# Patient Record
Sex: Female | Born: 1945
Health system: Southern US, Community
[De-identification: ages and names within clinical notes are randomized; demographics above are authoritative.]

## PROBLEM LIST (undated history)

## (undated) DIAGNOSIS — G43909 Migraine, unspecified, not intractable, without status migrainosus: Secondary | ICD-10-CM

## (undated) DIAGNOSIS — Z809 Family history of malignant neoplasm, unspecified: Secondary | ICD-10-CM

## (undated) DIAGNOSIS — K56609 Unspecified intestinal obstruction, unspecified as to partial versus complete obstruction: Secondary | ICD-10-CM

## (undated) DIAGNOSIS — K219 Gastro-esophageal reflux disease without esophagitis: Secondary | ICD-10-CM

## (undated) DIAGNOSIS — C801 Malignant (primary) neoplasm, unspecified: Secondary | ICD-10-CM

## (undated) HISTORY — DX: Migraine, unspecified, not intractable, without status migrainosus: G43.909

## (undated) HISTORY — DX: Family history of malignant neoplasm, unspecified: Z80.9

## (undated) HISTORY — DX: Unspecified intestinal obstruction, unspecified as to partial versus complete obstruction: K56.609

## (undated) HISTORY — DX: Gastro-esophageal reflux disease without esophagitis: K21.9

## (undated) SURGERY — Surgical Case
Anesthesia: *Unknown

---
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
## 1998-02-07 ENCOUNTER — Other Ambulatory Visit: Admission: RE | Admit: 1998-02-07 | Discharge: 1998-02-07 | Payer: Self-pay | Admitting: *Deleted

## 1998-09-20 ENCOUNTER — Ambulatory Visit (HOSPITAL_COMMUNITY): Admission: RE | Admit: 1998-09-20 | Discharge: 1998-09-20 | Payer: Self-pay | Admitting: *Deleted

## 1998-09-20 ENCOUNTER — Encounter: Payer: Self-pay | Admitting: *Deleted

## 1998-10-07 ENCOUNTER — Ambulatory Visit (HOSPITAL_COMMUNITY): Admission: RE | Admit: 1998-10-07 | Discharge: 1998-10-07 | Payer: Self-pay | Admitting: *Deleted

## 1998-10-07 ENCOUNTER — Encounter: Payer: Self-pay | Admitting: *Deleted

## 1999-05-15 ENCOUNTER — Other Ambulatory Visit: Admission: RE | Admit: 1999-05-15 | Discharge: 1999-05-15 | Payer: Self-pay | Admitting: *Deleted

## 2002-03-18 ENCOUNTER — Encounter: Payer: Self-pay | Admitting: *Deleted

## 2002-03-18 ENCOUNTER — Emergency Department (HOSPITAL_COMMUNITY): Admission: EM | Admit: 2002-03-18 | Discharge: 2002-03-18 | Payer: Self-pay | Admitting: Emergency Medicine

## 2002-04-25 ENCOUNTER — Ambulatory Visit (HOSPITAL_COMMUNITY): Admission: RE | Admit: 2002-04-25 | Discharge: 2002-04-25 | Payer: Self-pay | Admitting: Neurology

## 2002-04-25 ENCOUNTER — Encounter: Payer: Self-pay | Admitting: Neurology

## 2002-06-22 ENCOUNTER — Encounter: Payer: Self-pay | Admitting: Obstetrics and Gynecology

## 2002-06-22 ENCOUNTER — Ambulatory Visit (HOSPITAL_COMMUNITY): Admission: RE | Admit: 2002-06-22 | Discharge: 2002-06-22 | Payer: Self-pay | Admitting: Obstetrics and Gynecology

## 2003-03-12 LAB — TSH: TSH: 1.71

## 2003-03-12 LAB — LIPID PANEL

## 2003-03-12 LAB — COMPREHENSIVE METABOLIC PANEL
ALT: 17 U/L (ref 7–35)
Alkaline Phosphatase: 91 U/L
Total Bilirubin: 0.9 mg/dL

## 2008-09-07 ENCOUNTER — Emergency Department (HOSPITAL_COMMUNITY): Admission: EM | Admit: 2008-09-07 | Discharge: 2008-09-07 | Payer: Self-pay | Admitting: Emergency Medicine

## 2010-12-12 ENCOUNTER — Encounter: Payer: Self-pay | Admitting: Family Medicine

## 2010-12-12 ENCOUNTER — Ambulatory Visit (INDEPENDENT_AMBULATORY_CARE_PROVIDER_SITE_OTHER): Payer: Medicare HMO | Admitting: Family Medicine

## 2010-12-12 VITALS — BP 118/72 | HR 72 | Temp 97.7°F | Ht 67.0 in | Wt 177.2 lb

## 2010-12-12 DIAGNOSIS — Z0000 Encounter for general adult medical examination without abnormal findings: Secondary | ICD-10-CM

## 2010-12-12 DIAGNOSIS — K219 Gastro-esophageal reflux disease without esophagitis: Secondary | ICD-10-CM

## 2010-12-12 DIAGNOSIS — Z1231 Encounter for screening mammogram for malignant neoplasm of breast: Secondary | ICD-10-CM

## 2010-12-12 DIAGNOSIS — Z809 Family history of malignant neoplasm, unspecified: Secondary | ICD-10-CM

## 2010-12-12 NOTE — Patient Instructions (Signed)
pass by [facility] office for setting up mammogram. Return for welcome to medicare visit. Return prior fasting for blood work. Stool kit provided today. We will do breast exam and pelvic next visit at your welcome to medicare visit. I'll request reocrds from Dr. Malon Kindle office.

## 2010-12-12 NOTE — Assessment & Plan Note (Addendum)
Declines colonoscopy. Will set up with mammo and iFOB today. Return for welcome to medicare visit and well woman exam as due. Return fasting for blood work as well.

## 2010-12-12 NOTE — Progress Notes (Signed)
Subjective:    Patient ID: Katherine Boone, female    DOB: August 29, 1945, 65 y.o.   MRN: [id number]  HPI CC: new pt establish  Previously saw Dr. Modena Jansky at [place].  Used to see OBGYN, no doctor in 8 years.  New to medicare this year.  Did fall down flight of steps, 2 years ago broke right arm.  Healed well.  GERD - knows food to avoid but doesn't always avoid.  Takes prilosec otc as needed. H/o migraines, none recently.  Had CT done 9 yrs ago by neurology, told white matter changes of aging  Strong family hx stomach and colon cancer and lung cancer (smokers).  Pt denies BM changes, blood in stool, cough, weight changes, fever, is nonsmoker.   Preventative: Due for mammogram, last 8 yrs ago.  Right breast was sore last few weeks, now better with rest. Due for pelvic exam, last 8 yrs ago and normal. Never had colonoscopy done.  Declines.  Thinks would do stool kit. Thinks tetanus with Dr. Dorothe Pea, will request records. Flu shot - declines.  Medications and allergies reviewed and updated in chart.  Past histories reviewed and updated if relevant as below. There is no problem list on file for this patient.  Past Medical History  Diagnosis Date  . GERD (gastroesophageal reflux disease)     diet controlled  . Migraines     none recently   Past Surgical History  Procedure Date  . Appendectomy 1960   History  Substance Use Topics  . Smoking status: Never Smoker   . Smokeless tobacco: Never Used  . Alcohol Use: No   Family History  Problem Relation Age of Onset  . Heart disease Mother   . Hypertension Mother   . Cancer Mother 53    colon and stomach  . Cancer Brother     stomach and throat, smoker  . Ovarian cancer      maternal neice  . Hypertension Sister   . Cancer Brother     stomach and lung, smoker  . COPD Brother   . Throat cancer Brother   . Cancer Father     stomach cancer  . Stroke Sister   . Diabetes Neg Hx   . Coronary artery disease Neg Hx    No Known  Allergies No current outpatient prescriptions on file prior to visit.   Review of Systems  Constitutional: Positive for unexpected weight change (gaining weight). Negative for fever, chills, activity change, appetite change and fatigue.  HENT: Negative for hearing loss and neck pain.   Eyes: Negative for visual disturbance.  Respiratory: Negative for cough, chest tightness, shortness of breath and wheezing.   Cardiovascular: Negative for chest pain, palpitations and leg swelling.  Gastrointestinal: Negative for nausea, vomiting, abdominal pain, diarrhea, constipation, blood in stool and abdominal distention.  Genitourinary: Negative for hematuria and difficulty urinating.  Musculoskeletal: Negative for myalgias and arthralgias.  Skin: Negative for rash.  Neurological: Negative for dizziness, seizures, syncope and headaches.  Hematological: Does not bruise/bleed easily.  Psychiatric/Behavioral: Positive for dysphoric mood (with weather change). The patient is not nervous/anxious.        Objective:   Physical Exam  Nursing note and vitals reviewed. Constitutional: She is oriented to person, place, and time. She appears well-developed and well-nourished. No distress.  HENT:  Head: Normocephalic and atraumatic.  Right Ear: External ear normal.  Left Ear: External ear normal.  Nose: Nose normal.  Mouth/Throat: Oropharynx is clear and moist.  Eyes: Conjunctivae  and EOM are normal. Pupils are equal, round, and reactive to light.  Neck: Normal range of motion. Neck supple. No thyromegaly present.  Cardiovascular: Normal rate, regular rhythm, normal heart sounds and intact distal pulses.   No murmur heard. Pulses:      Radial pulses are 2+ on the right side, and 2+ on the left side.  Pulmonary/Chest: Effort normal and breath sounds normal. No respiratory distress. She has no wheezes. She has no rales.  Abdominal: Soft. Bowel sounds are normal. She exhibits no distension and no mass. There  is no tenderness. There is no rebound and no guarding.  Musculoskeletal: Normal range of motion.  Lymphadenopathy:    She has no cervical adenopathy.  Neurological: She is alert and oriented to person, place, and time.       CN grossly intact, station and gait intact  Skin: Skin is warm and dry. No rash noted.  Psychiatric: She has a normal mood and affect. Her behavior is normal. Judgment and thought content normal.      Assessment & Plan:

## 2010-12-12 NOTE — Assessment & Plan Note (Signed)
Reviewed foods to avoid.   Seems stable on prilosec otc PRN.

## 2010-12-26 ENCOUNTER — Other Ambulatory Visit (INDEPENDENT_AMBULATORY_CARE_PROVIDER_SITE_OTHER): Payer: Medicare HMO

## 2010-12-26 DIAGNOSIS — Z0000 Encounter for general adult medical examination without abnormal findings: Secondary | ICD-10-CM

## 2010-12-26 DIAGNOSIS — Z79899 Other long term (current) drug therapy: Secondary | ICD-10-CM

## 2010-12-26 LAB — CBC WITH DIFFERENTIAL/PLATELET
Basophils Relative: 0.5 % (ref 0.0–3.0)
Eosinophils Relative: 2.4 % (ref 0.0–5.0)
HCT: 38.8 % (ref 36.0–46.0)
Hemoglobin: 13.3 g/dL (ref 12.0–15.0)
Lymphs Abs: 1.5 10*3/uL (ref 0.7–4.0)
MCV: 87.2 fl (ref 78.0–100.0)
Monocytes Absolute: 0.4 10*3/uL (ref 0.1–1.0)
Neutro Abs: 3 10*3/uL (ref 1.4–7.7)
RBC: 4.45 Mil/uL (ref 3.87–5.11)
WBC: 5.1 10*3/uL (ref 4.5–10.5)

## 2010-12-26 LAB — COMPREHENSIVE METABOLIC PANEL
AST: 22 U/L (ref 0–37)
Albumin: 3.9 g/dL (ref 3.5–5.2)
Alkaline Phosphatase: 114 U/L (ref 39–117)
BUN: 9 mg/dL (ref 6–23)
Creatinine, Ser: 0.7 mg/dL (ref 0.4–1.2)
Potassium: 4.2 mEq/L (ref 3.5–5.1)

## 2010-12-26 LAB — LIPID PANEL
HDL: 59.1 mg/dL (ref >39.00)
VLDL: 19.8 mg/dL (ref 0.0–40.0)

## 2010-12-28 ENCOUNTER — Other Ambulatory Visit: Payer: Medicare HMO

## 2010-12-28 ENCOUNTER — Encounter: Payer: Self-pay | Admitting: *Deleted

## 2010-12-28 ENCOUNTER — Other Ambulatory Visit: Payer: Self-pay | Admitting: Family Medicine

## 2010-12-28 ENCOUNTER — Ambulatory Visit
Admission: RE | Admit: 2010-12-28 | Discharge: 2010-12-28 | Disposition: A | Discharge status: Home / Self Care | Payer: Medicare HMO | Source: Ambulatory Visit | Attending: Family Medicine | Admitting: Family Medicine

## 2010-12-28 DIAGNOSIS — Z1231 Encounter for screening mammogram for malignant neoplasm of breast: Secondary | ICD-10-CM

## 2010-12-28 DIAGNOSIS — Z1211 Encounter for screening for malignant neoplasm of colon: Secondary | ICD-10-CM

## 2011-01-01 ENCOUNTER — Encounter: Payer: Self-pay | Admitting: *Deleted

## 2011-01-02 ENCOUNTER — Other Ambulatory Visit (HOSPITAL_COMMUNITY)
Admission: RE | Admit: 2011-01-02 | Discharge: 2011-01-02 | Disposition: A | Discharge status: Home / Self Care | Payer: Medicare HMO | Source: Ambulatory Visit | Attending: Family Medicine | Admitting: Family Medicine

## 2011-01-02 ENCOUNTER — Encounter: Payer: Self-pay | Admitting: Family Medicine

## 2011-01-02 ENCOUNTER — Ambulatory Visit (INDEPENDENT_AMBULATORY_CARE_PROVIDER_SITE_OTHER): Payer: Medicare HMO | Admitting: Family Medicine

## 2011-01-02 VITALS — BP 110/72 | HR 72 | Temp 97.7°F | Ht 67.0 in | Wt 179.8 lb

## 2011-01-02 DIAGNOSIS — R3915 Urgency of urination: Secondary | ICD-10-CM

## 2011-01-02 DIAGNOSIS — Z136 Encounter for screening for cardiovascular disorders: Secondary | ICD-10-CM

## 2011-01-02 DIAGNOSIS — Z780 Asymptomatic menopausal state: Secondary | ICD-10-CM

## 2011-01-02 DIAGNOSIS — Z0000 Encounter for general adult medical examination without abnormal findings: Secondary | ICD-10-CM

## 2011-01-02 DIAGNOSIS — Z01419 Encounter for gynecological examination (general) (routine) without abnormal findings: Secondary | ICD-10-CM | POA: Insufficient documentation

## 2011-01-02 DIAGNOSIS — Z1159 Encounter for screening for other viral diseases: Secondary | ICD-10-CM | POA: Insufficient documentation

## 2011-01-02 DIAGNOSIS — G4700 Insomnia, unspecified: Secondary | ICD-10-CM | POA: Insufficient documentation

## 2011-01-02 DIAGNOSIS — R350 Frequency of micturition: Secondary | ICD-10-CM

## 2011-01-02 LAB — POCT URINALYSIS DIPSTICK
Bilirubin, UA: NEGATIVE
Ketones, UA: NEGATIVE

## 2011-01-02 NOTE — Patient Instructions (Addendum)
Call your insurace about the shingles shot to see if it is covered or how much it would cost and where is cheaper (here or pharmacy).  If you want to receive here, call for nurse visit. Flu shot today. Think about pneumonia shot, shingles shot, colonoscopy and let us know if you'd like to receive. Pass by [facility] office to schedule dexa scan. EKG today. Return to see me to further discuss bladder at your convenience.  Bladder diary provided.  Urine checked for infection today. Good to see you today, call us with questions. Have dermatology check that spot on forehead. Set up vision screen. For sleep - try those things we talked about - no TV in bedroom, use bed only for sleep, no stimulating activity prior to bedtime, if unable to fall asleep, get up and do another activity for 15-20 min then try again.

## 2011-01-02 NOTE — Progress Notes (Signed)
Subjective:    Patient ID: Katherine Boone, female    DOB: Oct 27, 1945, 65 y.o.   MRN: [id number]  HPI CC: welcome to medicare, well woman  Check spot on forehead - comes and goes. No bleeding, itching, discomfort.  When hits, gets irritated.  rec see derm.    Sleep issues - some trouble sleeping.  Does do check writing, homework in bed.  Does have tv in bedroom.    Incontinence - issue with this, notices more stress sxs, esp when exercising.  Also does endorse some urgency symptoms.  Going on for last year but recently worse.  No dysuria, flank pain, fevers/chills, nausea, abd pain.  Strong family hx stomach (father) and colon cancer (mother) and lung cancer (smokers). Pt denies BM changes, blood in stool, cough, weight changes, fever, is nonsmoker.   Preventative:  Set up with mammo and iFOB last visit - mammogram birads-1 12/28/2010.  iFOB negative 12/28/2010. Due for pelvic exam, last 8 yrs ago and normal.  Never had colonoscopy done. Declines. Had stool kit done, negative iFOB. Thinks tetanus with Dr. Dorothe Pea, awaiting records. Flu shot - to be given today. Declines pneumonia shot.  Declines shingle shot. Never tested for osteoporosis. No recent vision screen - rec set up. No problems hearing. Had EKG done remotely - none recently. Timed get up and go test <30 sec. Advanced directives - doesn't want prolonged life support.requests information on advanced directives. Reviewed blood work in detail.  Medications and allergies reviewed and updated in chart.  Past histories reviewed and updated if relevant as below. Patient Active Problem List  Diagnoses  . GERD (gastroesophageal reflux disease)  . Family history of cancer   Past Medical History  Diagnosis Date  . GERD (gastroesophageal reflux disease)     diet controlled  . Migraines     none recently  . Family history of cancer     stomach, colon, lung, throat   Past Surgical History  Procedure Date  . Appendectomy 1960    History  Substance Use Topics  . Smoking status: Never Smoker   . Smokeless tobacco: Never Used  . Alcohol Use: No   Family History  Problem Relation Age of Onset  . Heart disease Mother   . Hypertension Mother   . Cancer Mother 93    colon and stomach  . Cancer Brother     stomach and throat, smoker  . Ovarian cancer      maternal neice  . Hypertension Sister   . Cancer Brother     stomach and lung, smoker  . COPD Brother   . Throat cancer Brother   . Cancer Father     stomach cancer  . Stroke Sister   . Diabetes Neg Hx   . Coronary artery disease Neg Hx    No Known Allergies No current outpatient prescriptions on file prior to visit.   Review of Systems  Constitutional: Negative for fever, chills, activity change, appetite change, fatigue and unexpected weight change.  HENT: Negative for hearing loss and neck pain.   Eyes: Negative for visual disturbance.  Respiratory: Negative for cough, chest tightness, shortness of breath and wheezing.   Cardiovascular: Negative for chest pain, palpitations and leg swelling.  Gastrointestinal: Negative for nausea, vomiting, abdominal pain, diarrhea, constipation, blood in stool and abdominal distention.  Genitourinary: Negative for hematuria and difficulty urinating.  Musculoskeletal: Negative for myalgias and arthralgias.  Skin: Negative for rash.  Neurological: Negative for dizziness, seizures, syncope and headaches.  Hematological: Does not bruise/bleed easily.  Psychiatric/Behavioral: Positive for dysphoric mood (with weather change). The patient is not nervous/anxious.       Objective:   Physical Exam  Nursing note and vitals reviewed. Constitutional: She is oriented to person, place, and time. She appears well-developed and well-nourished. No distress.  HENT:  Head: Normocephalic and atraumatic.  Right Ear: External ear normal.  Left Ear: External ear normal.  Nose: Nose normal.  Mouth/Throat: Oropharynx is clear  and moist. No oropharyngeal exudate.  Eyes: Conjunctivae and EOM are normal. Pupils are equal, round, and reactive to light. No scleral icterus.  Neck: Normal range of motion. Neck supple. No thyromegaly present.  Cardiovascular: Normal rate, regular rhythm, normal heart sounds and intact distal pulses.   No murmur heard. Pulses:      Radial pulses are 2+ on the right side, and 2+ on the left side.  Pulmonary/Chest: Effort normal and breath sounds normal. No respiratory distress. She has no wheezes. She has no rales. Right breast exhibits no inverted nipple, no mass, no nipple discharge, no skin change and no tenderness. Left breast exhibits no inverted nipple, no mass, no nipple discharge, no skin change and no tenderness. Breasts are symmetrical.  Abdominal: Soft. Bowel sounds are normal. She exhibits no distension and no mass. There is no tenderness. There is no rebound and no guarding.  Genitourinary: Vagina normal and uterus normal. No breast swelling, tenderness, discharge or bleeding. Pelvic exam was performed with patient supine. There is no rash or tenderness on the right labia. There is no rash or tenderness on the left labia. Cervix exhibits no motion tenderness, no discharge and no friability. Right adnexum displays no mass, no tenderness and no fullness. Left adnexum displays no mass, no tenderness and no fullness. No erythema or tenderness around the vagina. No vaginal discharge found.       No rectocele/cystocele appreciated on exam or with valsalva. No urethral prolapse  Musculoskeletal: Normal range of motion.  Lymphadenopathy:    She has no cervical adenopathy.    She has no axillary adenopathy.       Right: No supraclavicular adenopathy present.       Left: No supraclavicular adenopathy present.  Neurological: She is alert and oriented to person, place, and time.       CN grossly intact, station and gait intact Timed get up and go <30 sec  Skin: Skin is warm and dry. No rash  noted.       R forehead with scaly growth, circumscribed  Psychiatric: She has a normal mood and affect. Her behavior is normal. Judgment and thought content normal.      Assessment & Plan:

## 2011-01-02 NOTE — Assessment & Plan Note (Signed)
Discussed sleep hygiene - currently poor.  Work on this.

## 2011-01-02 NOTE — Assessment & Plan Note (Signed)
Checked UA to r/o infection - sent culture. Discussed treatment options for stress vs urge.  Sounds like may be combination of both. Return for further discussion.

## 2011-01-04 LAB — URINE CULTURE: Colony Count: >=100000

## 2011-01-07 ENCOUNTER — Encounter: Payer: Self-pay | Admitting: Family Medicine

## 2011-01-08 ENCOUNTER — Encounter: Payer: Self-pay | Admitting: Family Medicine

## 2011-01-09 NOTE — Assessment & Plan Note (Signed)
I have personally reviewed the welcome to medicare questionnaire, have asked to scan into system, and have noted 1. The patient's medical and social history 2. Their use of alcohol, tobacco or illicit drugs 3. Their current medications and supplements 4. The patient's functional ability including ADL's, fall risks, home safety risks and hearing or visual impairment. 5. Diet and physical activities 6. Evidence for depression or mood disorders The patients weight, height, BMI have been recorded in the chart.  Hearing and vision has been addressed. I have made referrals, counseling and provided education to the patient based review of the above and I have provided the pt with a written personalized care plan for preventive services.  Flu shot today. Await records for last tetanus. Declines pneumonia, shingles, colonscopy despite discussing strong family history. iFOB normal. mammogram Birads 1, both 12/2010. Breast exam and pap smear/pelvic today. + depression screen - PHQ9 = 7/27.  Advised to let us know if becoming more of an issue or feeling overwhelmed.  Endorses mild dysthymia with holidays. EKG - NSR 67, normal axis, intervals, no ST/T changes. Reviewed advanced directives, provided with information on this. Schedule for dexa scan, recommend set up for vision screen.

## 2011-01-10 ENCOUNTER — Encounter: Payer: Self-pay | Admitting: *Deleted

## 2011-02-03 DIAGNOSIS — M810 Age-related osteoporosis without current pathological fracture: Secondary | ICD-10-CM | POA: Insufficient documentation

## 2011-02-06 ENCOUNTER — Ambulatory Visit
Admission: RE | Admit: 2011-02-06 | Discharge: 2011-02-06 | Disposition: A | Discharge status: Home / Self Care | Payer: Medicare HMO | Source: Ambulatory Visit | Attending: Family Medicine | Admitting: Family Medicine

## 2011-02-06 DIAGNOSIS — Z780 Asymptomatic menopausal state: Secondary | ICD-10-CM

## 2011-02-12 ENCOUNTER — Other Ambulatory Visit: Payer: Self-pay | Admitting: Family Medicine

## 2011-02-12 ENCOUNTER — Encounter: Payer: Self-pay | Admitting: Family Medicine

## 2011-02-12 MED ORDER — CALCIUM CARBONATE-VIT D-MIN 1200-1000 MG-UNIT PO CHEW: 1.0000 | CHEWABLE_TABLET | Freq: Every day | ORAL | Status: DC

## 2011-02-20 ENCOUNTER — Encounter: Payer: Self-pay | Admitting: *Deleted

## 2011-11-27 ENCOUNTER — Other Ambulatory Visit: Payer: Self-pay | Admitting: Family Medicine

## 2011-11-27 DIAGNOSIS — Z1231 Encounter for screening mammogram for malignant neoplasm of breast: Secondary | ICD-10-CM

## 2011-12-10 ENCOUNTER — Ambulatory Visit (INDEPENDENT_AMBULATORY_CARE_PROVIDER_SITE_OTHER): Payer: Medicare HMO | Admitting: Family Medicine

## 2011-12-10 ENCOUNTER — Encounter: Payer: Self-pay | Admitting: Family Medicine

## 2011-12-10 VITALS — BP 134/80 | HR 60 | Temp 98.2°F | Wt 188.2 lb

## 2011-12-10 DIAGNOSIS — S61412A Laceration without foreign body of left hand, initial encounter: Secondary | ICD-10-CM

## 2011-12-10 DIAGNOSIS — Z23 Encounter for immunization: Secondary | ICD-10-CM

## 2011-12-10 DIAGNOSIS — S61409A Unspecified open wound of unspecified hand, initial encounter: Secondary | ICD-10-CM

## 2011-12-10 NOTE — Addendum Note (Signed)
Addended by: Josph Macho A on: 12/10/2011 02:33 PM   Modules accepted: Orders

## 2011-12-10 NOTE — Patient Instructions (Addendum)
Tetanus shot today (Td). Keep hand wrapped and dry for 24 hours then may wash gently with soapy water. Please let us know if any spreading redness or draining pus.

## 2011-12-10 NOTE — Progress Notes (Signed)
  Subjective:    Patient ID: Katherine Boone, female    DOB: [birth year], 66 y.o.   MRN: [id number]  HPI CC: left hand lac  DOI: 12/10/2011 Washing dishes this morning, one dish broke and she cut her left medial hand dorsally on plate. Bleeding controlled.  Tetanus - unsure  Review of Systems Per HPI    Objective:   Physical Exam  Nursing note and vitals reviewed. Constitutional: She appears well-developed and well-nourished. No distress.  Musculoskeletal:       Hands:      Hand extensor tendons intact. 3.5cm laceration dorsal left hand with dog ear at edge of hand  Skin:       Neurovascularly intact       Assessment & Plan:

## 2011-12-10 NOTE — Assessment & Plan Note (Signed)
Will need sutures. 5 3.0 vicryl sutures placed. rtc 7d for suture removal. No need for abx. Td updated.  IC obtained and in chart.  Area anesthetized with 5cc of 1% lido with epi buffered with bicarb.  Area cleaned with betadine and explored - no evident extensor tendon injury.  Using semisterile technique, 5 stitches placed using 3.0 nonabsorbable vicryl.  Pt tolerated procedure well.  Area cleaned and dressed with antibiotic ointment and nonadherent gauze.

## 2011-12-18 ENCOUNTER — Ambulatory Visit: Payer: Medicare HMO | Admitting: Family Medicine

## 2011-12-20 ENCOUNTER — Encounter: Payer: Self-pay | Admitting: Family Medicine

## 2011-12-20 ENCOUNTER — Ambulatory Visit (INDEPENDENT_AMBULATORY_CARE_PROVIDER_SITE_OTHER): Payer: Medicare HMO | Admitting: Family Medicine

## 2011-12-20 VITALS — BP 112/66 | HR 72 | Temp 97.8°F | Wt 184.8 lb

## 2011-12-20 DIAGNOSIS — S61412A Laceration without foreign body of left hand, initial encounter: Secondary | ICD-10-CM

## 2011-12-20 DIAGNOSIS — S61409A Unspecified open wound of unspecified hand, initial encounter: Secondary | ICD-10-CM

## 2011-12-20 NOTE — Assessment & Plan Note (Signed)
Good approximation of wound edges after sutures. Steri strips applied. Discussed home care. RTC PRN.

## 2011-12-20 NOTE — Progress Notes (Signed)
CC: suture removal. HPI:  See prior note for details. No fevers/chills, draining or significant pain or erythema.  PE:  NAD Seems to have good approximation of skin edges of prior laceration. EtOH used to clean area. Sutures removed. steristrips applied.

## 2011-12-20 NOTE — Patient Instructions (Signed)
flu shot today. Good to see you today ,call us with questions. Keep dry for 24 yours then may shower as normal. Steri strips should fall off on their own.

## 2011-12-31 ENCOUNTER — Ambulatory Visit (HOSPITAL_COMMUNITY)
Admission: RE | Admit: 2011-12-31 | Discharge: 2011-12-31 | Disposition: A | Discharge status: Home / Self Care | Payer: Medicare HMO | Source: Ambulatory Visit | Attending: Family Medicine | Admitting: Family Medicine

## 2011-12-31 DIAGNOSIS — Z1231 Encounter for screening mammogram for malignant neoplasm of breast: Secondary | ICD-10-CM

## 2012-01-03 ENCOUNTER — Other Ambulatory Visit: Payer: Self-pay | Admitting: Family Medicine

## 2012-01-03 DIAGNOSIS — R928 Other abnormal and inconclusive findings on diagnostic imaging of breast: Secondary | ICD-10-CM

## 2012-01-11 ENCOUNTER — Ambulatory Visit
Admission: RE | Admit: 2012-01-11 | Discharge: 2012-01-11 | Disposition: A | Discharge status: Home / Self Care | Payer: Medicare HMO | Source: Ambulatory Visit | Attending: Family Medicine | Admitting: Family Medicine

## 2012-01-11 DIAGNOSIS — R928 Other abnormal and inconclusive findings on diagnostic imaging of breast: Secondary | ICD-10-CM

## 2012-01-14 ENCOUNTER — Encounter: Payer: Self-pay | Admitting: *Deleted

## 2012-11-20 ENCOUNTER — Ambulatory Visit (INDEPENDENT_AMBULATORY_CARE_PROVIDER_SITE_OTHER): Payer: Medicare HMO | Admitting: Family Medicine

## 2012-11-20 ENCOUNTER — Encounter: Payer: Self-pay | Admitting: Family Medicine

## 2012-11-20 VITALS — BP 112/70 | HR 65 | Temp 98.0°F | Wt 182.8 lb

## 2012-11-20 DIAGNOSIS — N6320 Unspecified lump in the left breast, unspecified quadrant: Secondary | ICD-10-CM

## 2012-11-20 DIAGNOSIS — N63 Unspecified lump in unspecified breast: Secondary | ICD-10-CM

## 2012-11-20 DIAGNOSIS — Z23 Encounter for immunization: Secondary | ICD-10-CM

## 2012-11-20 DIAGNOSIS — N644 Mastodynia: Secondary | ICD-10-CM | POA: Insufficient documentation

## 2012-11-20 NOTE — Progress Notes (Signed)
  Subjective:    Patient ID: Katherine Boone, female    DOB: 1945/07/02, 67 y.o.   MRN: [id number]  HPI CC: L breast swelling  Present for the last 6+ months.   Had abnormal screening mammogram done last year, on f/u L diagnostic mammogram no residual abnormality found.  [place] not done.  Tender spot left lower inner breast, feels area swelling. When rubs on abd wall, causes soreness at breast. No nipple discharge. Never smoker. No personal or family h/o breast cancer.  Past Medical History  Diagnosis Date  . GERD (gastroesophageal reflux disease)     diet controlled  . Migraines     none recently  . Family history of cancer     stomach, colon, lung, throat  . Gout 03/2005    ?  Marland Kitchen Osteoporosis 02/2011    DEXA 12/12 - Lspine -2.6; femur -3.8    Past Surgical History  Procedure Laterality Date  . Appendectomy  1960    Family History  Problem Relation Age of Onset  . Heart disease Mother   . Hypertension Mother   . Cancer Mother 42    colon and stomach  . Cancer Brother     stomach and throat, smoker  . Ovarian cancer      maternal neice  . Hypertension Sister   . Cancer Brother     stomach and lung, smoker  . COPD Brother   . Throat cancer Brother   . Cancer Father     stomach cancer  . Stroke Sister   . Diabetes Neg Hx   . Coronary artery disease Neg Hx     Review of Systems Per HPI    Objective:   Physical Exam  Nursing note and vitals reviewed. Constitutional: She appears well-developed and well-nourished. No distress.  Pulmonary/Chest: Right breast exhibits no inverted nipple, no mass, no nipple discharge, no skin change and no tenderness. Left breast exhibits mass. Left breast exhibits no inverted nipple, no nipple discharge, no skin change and no tenderness. Breasts are asymmetrical.    No discrete mass palpable, however there is some induration/hardening of breast tissue at left inner lower quadrant No nipple discharge, no skin change overlying breast  asymmetry.  Lymphadenopathy:    She has no axillary adenopathy.       Right axillary: No lateral adenopathy present.       Left axillary: No lateral adenopathy present.      Right: No supraclavicular adenopathy present.       Left: No supraclavicular adenopathy present.       Assessment & Plan:

## 2012-11-20 NOTE — Assessment & Plan Note (Signed)
No discrete mass palpable. ?heterogeneity of breast vs fibrocystic change, however given changing and tender, will need diagnostic mammo and possible [place] to r/o malignancy. Refer for mammo and possible [place]. Pt agrees with plan.

## 2012-11-20 NOTE — Addendum Note (Signed)
Addended by: Shon Millet on: 11/20/2012 04:28 PM   Modules accepted: Orders

## 2012-11-20 NOTE — Patient Instructions (Signed)
Flu shot today. Pass by [facility] office to schedule diagnostic mammogram of left breast. Good to see you today, call us with questions.

## 2012-12-04 ENCOUNTER — Ambulatory Visit
Admission: RE | Admit: 2012-12-04 | Discharge: 2012-12-04 | Disposition: A | Discharge status: Home / Self Care | Payer: Medicare HMO | Source: Ambulatory Visit | Attending: Family Medicine | Admitting: Family Medicine

## 2012-12-04 ENCOUNTER — Encounter: Payer: Self-pay | Admitting: *Deleted

## 2012-12-04 ENCOUNTER — Other Ambulatory Visit: Payer: Self-pay | Admitting: Family Medicine

## 2012-12-04 DIAGNOSIS — N6320 Unspecified lump in the left breast, unspecified quadrant: Secondary | ICD-10-CM

## 2014-05-11 ENCOUNTER — Other Ambulatory Visit: Payer: Self-pay

## 2014-05-11 DIAGNOSIS — N644 Mastodynia: Secondary | ICD-10-CM

## 2014-05-25 ENCOUNTER — Encounter (INDEPENDENT_AMBULATORY_CARE_PROVIDER_SITE_OTHER): Payer: Self-pay

## 2014-05-25 ENCOUNTER — Encounter: Payer: Self-pay | Admitting: Family Medicine

## 2014-05-25 ENCOUNTER — Ambulatory Visit (INDEPENDENT_AMBULATORY_CARE_PROVIDER_SITE_OTHER): Payer: Commercial Managed Care - HMO | Admitting: Family Medicine

## 2014-05-25 VITALS — BP 124/78 | HR 80 | Temp 97.9°F

## 2014-05-25 DIAGNOSIS — R42 Dizziness and giddiness: Secondary | ICD-10-CM | POA: Diagnosis not present

## 2014-05-25 DIAGNOSIS — N644 Mastodynia: Secondary | ICD-10-CM

## 2014-05-25 DIAGNOSIS — K219 Gastro-esophageal reflux disease without esophagitis: Secondary | ICD-10-CM

## 2014-05-25 NOTE — Progress Notes (Signed)
Pre visit review using our clinic review tool, if applicable. No additional management support is needed unless otherwise documented below in the visit note. 

## 2014-05-25 NOTE — Patient Instructions (Addendum)
Schedule medicare wellness visit at your convenience as you're due. Pass by [facility] office for referral for diagnostic mammogram and right ultrasound. For dizziness - continue to stay well hydrated. If any worsening heartburn or especially if any worsening trouble swallowing, or early feeling of fullness at beginning of meal or unexpected weight loss, let us know for referral to GI. Good to see you today, call us with questions.

## 2014-05-25 NOTE — Progress Notes (Signed)
BP 124/78 mmHg  Pulse 80  Temp(Src) 97.9 F (36.6 C) (Oral)   CC: R breast pain  Subjective:    Patient ID: Katherine Boone, female    DOB: 03/15/1945, 69 y.o.   MRN: [id number]  HPI: Katherine Boone is a 69 y.o. female presenting on 05/25/2014 for Acute Visit   Noticing some dizziness recently. Describes lightheaded feeling. No vertigo or presyncope. Off and on for last years. Finds she doesn't sleep very well. Denies fevers/chills, viral sxs or congestion or cough. Had one episode of vertigo associated with vomiting several months ago but not since. Feels she is staying well hydrated.   Occasional GERD treated with OTC prilosec. Occasional dysphagia (liquid and solid). No unexpected weight changes, early satiety. She does have strong fmhx stomach cancer.  Noticing sharp stabbing pain mid R breast right at nipple over last month. This has happed x3. Occurs at rest. No rashes, skin changes, lumps or nodules palpated. Left breast stays rather sore - ongoing for last several years.   Had normal bilateral diagnostic mammogram and L ultrasound 12/2012 despite painful spots in left breast. rec rpt mammogram 1 yr. Overdue for rpt.   Relevant past medical, surgical, family and social history reviewed and updated as indicated. Interim medical history since our last visit reviewed. Allergies and medications reviewed and updated. Current Outpatient Prescriptions on File Prior to Visit  Medication Sig  . Calcium Carbonate-Vit D-Min 1200-1000 MG-UNIT CHEW Chew 1 tablet by mouth daily.   No current facility-administered medications on file prior to visit.    Review of Systems Per HPI unless specifically indicated above     Objective:    BP 124/78 mmHg  Pulse 80  Temp(Src) 97.9 F (36.6 C) (Oral)  Wt Readings from Last 3 Encounters:  11/20/12 182 lb 12 oz (82.895 kg)  12/20/11 184 lb 12 oz (83.802 kg)  12/10/11 188 lb 4 oz (85.39 kg)    Physical Exam  Constitutional: She appears  well-developed and well-nourished. No distress.  HENT:  Mouth/Throat: Oropharynx is clear and moist. No oropharyngeal exudate.  Eyes: Conjunctivae and EOM are normal. Pupils are equal, round, and reactive to light.  Neck: Normal range of motion. Neck supple. No thyromegaly present.  Cardiovascular: Normal rate, regular rhythm, normal heart sounds and intact distal pulses.   No murmur heard. Pulmonary/Chest: Effort normal and breath sounds normal. No respiratory distress. She has no wheezes. She has no rales. Right breast exhibits no inverted nipple, no mass, no nipple discharge, no skin change and no tenderness. Left breast exhibits no inverted nipple, no mass, no nipple discharge, no skin change and no tenderness.  Abdominal: Soft. Bowel sounds are normal. She exhibits no distension and no mass. There is no tenderness. There is no rebound and no guarding.  Musculoskeletal: She exhibits no edema.  Lymphadenopathy:       Head (right side): No submental, no submandibular, no tonsillar, no preauricular and no posterior auricular adenopathy present.       Head (left side): No submental, no submandibular, no tonsillar, no preauricular and no posterior auricular adenopathy present.    She has no cervical adenopathy.    She has no axillary adenopathy.       Right axillary: No lateral adenopathy present.       Left axillary: No lateral adenopathy present.      Right: No supraclavicular adenopathy present.       Left: No supraclavicular adenopathy present.  Skin: Skin is warm and  dry. No rash noted.  Psychiatric: She has a normal mood and affect.  Nursing note and vitals reviewed.      Assessment & Plan:   Problem List Items Addressed This Visit    GERD (gastroesophageal reflux disease)    With rare intermittent dysphagia to liquids.  Recommended GI referral given extensive fmhx stomach cancer, pt declines. Reviewed red flags to advise [place] immediately for GI referral - pt states she will monitor  for these.      Dizziness    Exam reassuring, nonfocal neurological exam. Will check labs when returns fasting for medicare wellness visit. In interim, encouraged she continue to stay well hydrated. Update if worsening.      Breast pain, right - Primary    Exam reassuring. Due for mammogram. Will order diagnostic mammo and R ultrasound. Pt agrees.      Relevant Orders   MM Digital Diagnostic Bilat   US BREAST COMPLETE UNI RIGHT INC AXILLA       Follow up plan: Return if symptoms worsen or fail to improve, for medicare wellness.

## 2014-05-25 NOTE — Assessment & Plan Note (Signed)
With rare intermittent dysphagia to liquids.  Recommended GI referral given extensive fmhx stomach cancer, pt declines. Reviewed red flags to advise [place] immediately for GI referral - pt states she will monitor for these.

## 2014-05-25 NOTE — Assessment & Plan Note (Signed)
Exam reassuring, nonfocal neurological exam. Will check labs when returns fasting for medicare wellness visit. In interim, encouraged she continue to stay well hydrated. Update if worsening.

## 2014-05-25 NOTE — Assessment & Plan Note (Signed)
Exam reassuring. Due for mammogram. Will order diagnostic mammo and R ultrasound. Pt agrees.

## 2014-05-27 ENCOUNTER — Other Ambulatory Visit: Payer: Self-pay | Admitting: Family Medicine

## 2014-05-27 ENCOUNTER — Ambulatory Visit
Admission: RE | Admit: 2014-05-27 | Discharge: 2014-05-27 | Disposition: A | Discharge status: Home / Self Care | Payer: Commercial Managed Care - HMO | Source: Ambulatory Visit | Attending: Family Medicine | Admitting: Family Medicine

## 2014-05-27 DIAGNOSIS — N6459 Other signs and symptoms in breast: Secondary | ICD-10-CM | POA: Diagnosis not present

## 2014-05-27 DIAGNOSIS — N644 Mastodynia: Secondary | ICD-10-CM

## 2014-05-27 LAB — HM MAMMOGRAPHY: HM Mammogram: NORMAL

## 2014-05-31 ENCOUNTER — Encounter: Payer: Self-pay | Admitting: *Deleted

## 2014-06-02 DIAGNOSIS — L570 Actinic keratosis: Secondary | ICD-10-CM | POA: Diagnosis not present

## 2014-06-15 DIAGNOSIS — H52222 Regular astigmatism, left eye: Secondary | ICD-10-CM | POA: Diagnosis not present

## 2014-06-15 DIAGNOSIS — H5203 Hypermetropia, bilateral: Secondary | ICD-10-CM | POA: Diagnosis not present

## 2014-06-15 DIAGNOSIS — H5210 Myopia, unspecified eye: Secondary | ICD-10-CM | POA: Diagnosis not present

## 2014-06-15 DIAGNOSIS — H524 Presbyopia: Secondary | ICD-10-CM | POA: Diagnosis not present

## 2014-06-29 ENCOUNTER — Other Ambulatory Visit: Payer: Commercial Managed Care - HMO

## 2014-06-29 ENCOUNTER — Other Ambulatory Visit: Payer: Self-pay | Admitting: Family Medicine

## 2014-06-29 DIAGNOSIS — K219 Gastro-esophageal reflux disease without esophagitis: Secondary | ICD-10-CM

## 2014-06-29 DIAGNOSIS — E785 Hyperlipidemia, unspecified: Secondary | ICD-10-CM

## 2014-06-29 DIAGNOSIS — M810 Age-related osteoporosis without current pathological fracture: Secondary | ICD-10-CM

## 2014-06-30 ENCOUNTER — Other Ambulatory Visit (INDEPENDENT_AMBULATORY_CARE_PROVIDER_SITE_OTHER): Payer: Commercial Managed Care - HMO

## 2014-06-30 DIAGNOSIS — M810 Age-related osteoporosis without current pathological fracture: Secondary | ICD-10-CM | POA: Diagnosis not present

## 2014-06-30 DIAGNOSIS — E785 Hyperlipidemia, unspecified: Secondary | ICD-10-CM | POA: Diagnosis not present

## 2014-06-30 LAB — COMPREHENSIVE METABOLIC PANEL
ALT: 13 U/L (ref 0–35)
AST: 16 U/L (ref 0–37)
Albumin: 4.1 g/dL (ref 3.5–5.2)
Alkaline Phosphatase: 100 U/L (ref 39–117)
BILIRUBIN TOTAL: 0.6 mg/dL (ref 0.2–1.2)
BUN: 9 mg/dL (ref 6–23)
CALCIUM: 9.5 mg/dL (ref 8.4–10.5)
CO2: 31 meq/L (ref 19–32)
CREATININE: 1.21 mg/dL — AB (ref 0.40–1.20)
Chloride: 105 mEq/L (ref 96–112)
GFR: 46.94 mL/min — AB (ref >60.00)
Glucose, Bld: 94 mg/dL (ref 70–99)
Potassium: 4.4 mEq/L (ref 3.5–5.1)
Sodium: 139 mEq/L (ref 135–145)
TOTAL PROTEIN: 7.2 g/dL (ref 6.0–8.3)

## 2014-06-30 LAB — LIPID PANEL
CHOLESTEROL: 197 mg/dL (ref 0–200)
HDL: 55.6 mg/dL (ref >39.00)
LDL Cholesterol: 122 mg/dL — ABNORMAL HIGH (ref 0–99)
NonHDL: 141.4
Total CHOL/HDL Ratio: 4
Triglycerides: 98 mg/dL (ref 0.0–149.0)
VLDL: 19.6 mg/dL (ref 0.0–40.0)

## 2014-06-30 LAB — VITAMIN D 25 HYDROXY (VIT D DEFICIENCY, FRACTURES): VITD: 21.66 ng/mL — ABNORMAL LOW (ref 30.00–100.00)

## 2014-07-06 ENCOUNTER — Ambulatory Visit (INDEPENDENT_AMBULATORY_CARE_PROVIDER_SITE_OTHER): Payer: Commercial Managed Care - HMO | Admitting: Family Medicine

## 2014-07-06 ENCOUNTER — Encounter: Payer: Self-pay | Admitting: Family Medicine

## 2014-07-06 VITALS — BP 118/72 | HR 64 | Temp 97.8°F | Ht 65.75 in | Wt 176.8 lb

## 2014-07-06 DIAGNOSIS — Z1211 Encounter for screening for malignant neoplasm of colon: Secondary | ICD-10-CM

## 2014-07-06 DIAGNOSIS — E785 Hyperlipidemia, unspecified: Secondary | ICD-10-CM

## 2014-07-06 DIAGNOSIS — Z23 Encounter for immunization: Secondary | ICD-10-CM | POA: Diagnosis not present

## 2014-07-06 DIAGNOSIS — M810 Age-related osteoporosis without current pathological fracture: Secondary | ICD-10-CM

## 2014-07-06 DIAGNOSIS — N289 Disorder of kidney and ureter, unspecified: Secondary | ICD-10-CM | POA: Diagnosis not present

## 2014-07-06 DIAGNOSIS — Z0000 Encounter for general adult medical examination without abnormal findings: Secondary | ICD-10-CM | POA: Diagnosis not present

## 2014-07-06 DIAGNOSIS — Z7189 Other specified counseling: Secondary | ICD-10-CM | POA: Insufficient documentation

## 2014-07-06 LAB — POCT URINALYSIS DIPSTICK
Bilirubin, UA: NEGATIVE
Blood, UA: NEGATIVE
GLUCOSE UA: NEGATIVE
Ketones, UA: NEGATIVE
NITRITE UA: NEGATIVE
Protein, UA: NEGATIVE
SPEC GRAV UA: 1.025
Urobilinogen, UA: 0.2
pH, UA: 6

## 2014-07-06 MED ORDER — ALENDRONATE SODIUM 70 MG PO TABS
70.0000 mg | ORAL_TABLET | ORAL | Status: DC
Start: 2014-07-06 — End: 2015-12-06

## 2014-07-06 NOTE — Assessment & Plan Note (Signed)
Overall stable, reviewed #s.

## 2014-07-06 NOTE — Assessment & Plan Note (Signed)
Preventative protocols reviewed and updated unless pt declined. Discussed healthy diet and lifestyle.  

## 2014-07-06 NOTE — Assessment & Plan Note (Addendum)
Newly found. No h/o HTN, DM. Check UA today, will return in 2-3 wks to recheck renal panel, CBC, etc. Discussed importance of vit D.

## 2014-07-06 NOTE — Assessment & Plan Note (Addendum)
Discussed with patient - will renew dexa and start bisphosphonate. Discussed pros/cons of bisphosphonate treatment including mechanism of action, optimal administration, and need to monitor for hip pain dental pain or atypical fractures. No upcoming dental work. Will need to monitor renal function on bisphosphonate Discussed dietary calcium ,encouraged she take cal supplements if not reaching dietary calcium goal.

## 2014-07-06 NOTE — Progress Notes (Signed)
Pre visit review using our clinic review tool, if applicable. No additional management support is needed unless otherwise documented below in the visit note. 

## 2014-07-06 NOTE — Assessment & Plan Note (Signed)
Advanced directives - doesn't want prolonged life support.requests information on advanced directives. Packet provided today. Doesn't know who HCPOA would be.

## 2014-07-06 NOTE — Addendum Note (Signed)
Addended by: Royann Shivers A on: 07/06/2014 12:05 PM   Modules accepted: Orders

## 2014-07-06 NOTE — Patient Instructions (Addendum)
Prevnar today. Pass by lab for a stool kit today Urinalysis today  Let's start bisphosphonate (fosamax) take one pill weekly in the morning 42mn-1hr prior to breakfast. Return in 2-3 weeks for repeat labwork to recheck kidney levels. Return in 1 year for next medicare wellness visit  Osteoporosis Throughout your life, your body breaks down old bone and replaces it with new bone. As you get older, your body does not replace bone as quickly as it breaks it down. By the age of 357years, most people begin to gradually lose bone because of the imbalance between bone loss and replacement. Some people lose more bone than others. Bone loss beyond a specified normal degree is considered osteoporosis.  Osteoporosis affects the strength and durability of your bones. The inside of the ends of your bones and your flat bones, like the bones of your pelvis, look like honeycomb, filled with tiny open spaces. As bone loss occurs, your bones become less dense. This means that the open spaces inside your bones become bigger and the walls between these spaces become thinner. This makes your bones weaker. Bones of a person with osteoporosis can become so weak that they can break (fracture) during minor accidents, such as a simple fall. CAUSES  The following factors have been associated with the development of osteoporosis:  Smoking.  Drinking more than 2 alcoholic drinks several days per week.  Long-term use of certain medicines:  Corticosteroids.  Chemotherapy medicines.  Thyroid medicines.  Antiepileptic medicines.  Gonadal hormone suppression medicine.  Immunosuppression medicine.  Being underweight.  Lack of physical activity.  Lack of exposure to the sun. This can lead to vitamin D deficiency.  Certain medical conditions:  Certain inflammatory bowel diseases, such as Crohn disease and ulcerative colitis.  Diabetes.  Hyperthyroidism.  Hyperparathyroidism. RISK FACTORS Anyone can  develop osteoporosis. However, the following factors can increase your risk of developing osteoporosis:  Gender--Women are at higher risk than men.  Age--Being older than 50 years increases your risk.  Ethnicity--White and Asian people have an increased risk.  Weight --Being extremely underweight can increase your risk of osteoporosis.  Family history of osteoporosis--Having a family member who has developed osteoporosis can increase your risk. SYMPTOMS  Usually, people with osteoporosis have no symptoms.  DIAGNOSIS  Signs during a physical exam that may prompt your caregiver to suspect osteoporosis include:  Decreased height. This is usually caused by the compression of the bones that form your spine (vertebrae) because they have weakened and become fractured.  A curving or rounding of the upper back (kyphosis). To confirm signs of osteoporosis, your caregiver may request a procedure that uses 2 low-dose X-ray beams with different levels of energy to measure your bone mineral density (dual-energy X-ray absorptiometry [DXA]). Also, your caregiver may check your level of vitamin D. TREATMENT  The goal of osteoporosis treatment is to strengthen bones in order to decrease the risk of bone fractures. There are different types of medicines available to help achieve this goal. Some of these medicines work by slowing the processes of bone loss. Some medicines work by increasing bone density. Treatment also involves making sure that your levels of calcium and vitamin D are adequate. PREVENTION  There are things you can do to help prevent osteoporosis. Adequate intake of calcium and vitamin D can help you achieve optimal bone mineral density. Regular exercise can also help, especially resistance and weight-bearing activities. If you smoke, quitting smoking is an important part of osteoporosis prevention. MAKE SURE YOU:  Understand these instructions.  Will watch your condition.  Will get help  right away if you are not doing well or get worse. FOR MORE INFORMATION www.osteo.org and EquipmentWeekly.com.ee Document Released: 11/29/2004 Document Revised: 06/16/2012 Document Reviewed: 02/03/2011 [facility] Patient Information 2015 [place], [place]. This information is not intended to replace advice given to you by your health care provider. Make sure you discuss any questions you have with your health care provider.

## 2014-07-06 NOTE — Assessment & Plan Note (Signed)

## 2014-07-06 NOTE — Progress Notes (Signed)
BP 118/72 mmHg  Pulse 64  Temp(Src) 97.8 F (36.6 C) (Oral)  Ht 5' 5.75" (1.67 m)  Wt 176 lb 12.8 oz (80.196 kg)  BMI 28.76 kg/m2   CC: CPE  Subjective:    Patient ID: Katherine Boone, female    DOB: September 24, 1945, 69 y.o.   MRN: [id number]  HPI: Katherine Boone is a 69 y.o. female presenting on 07/06/2014 for Annual Exam   Failed hearing screen Eye exam last month at eye doctor. No falls in last year Denies depression/anhedonia  Preventative: Colon cancer screening - iFOB normal 12/2010 Mammogram (diagnostic and bilat [place]) normal 2014-06-29  Well woman exam - last 06-29-10 with normal pap smear. Declines rpt.  Osteoporosis Date: 02/2011 DEXA 12/12 - Lspine -2.6; femur -3.8 Flu 11/2012 Td 06-29-2011 prevnar today Declines shingle shot. Advanced directives - doesn't want prolonged life support. Requests information on advanced directives. Has not thought who would be HCPOA.  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 Interim medical history since our last visit reviewed. Allergies and medications reviewed and updated. Current Outpatient Prescriptions on File Prior to Visit  Medication Sig  . Calcium Carbonate-Vit D-Min 1200-1000 MG-UNIT CHEW Chew 1 tablet by mouth daily.  . cyanocobalamin 100 MCG tablet Take 100 mcg by mouth daily.   No current facility-administered medications on file prior to visit.    Review of Systems  Constitutional: Negative for fever, chills, activity change, appetite change, fatigue and unexpected weight change.  HENT: Negative for hearing loss.   Eyes: Negative for visual disturbance.  Respiratory:  Negative for cough, chest tightness, shortness of breath and wheezing.   Cardiovascular: Positive for leg swelling (occasional L sided). Negative for chest pain and palpitations.  Gastrointestinal: Negative for nausea, vomiting, abdominal pain, diarrhea, constipation, blood in stool and abdominal distention.  Genitourinary: Negative for hematuria and difficulty urinating.  Musculoskeletal: Negative for myalgias, arthralgias and neck pain.  Skin: Negative for rash.  Neurological: Negative for dizziness, seizures, syncope and headaches.  Hematological: Negative for adenopathy. Does not bruise/bleed easily.  Psychiatric/Behavioral: Negative for dysphoric mood. The patient is not nervous/anxious.    Per HPI unless specifically indicated above     Objective:    BP 118/72 mmHg  Pulse 64  Temp(Src) 97.8 F (36.6 C) (Oral)  Ht 5' 5.75" (1.67 m)  Wt 176 lb 12.8 oz (80.196 kg)  BMI 28.76 kg/m2  Wt Readings from Last 3 Encounters:  07/06/14 176 lb 12.8 oz (80.196 kg)  11/20/12 182 lb 12 oz (82.895 kg)  12/20/11 184 lb 12 oz (83.802 kg)    Physical Exam  Constitutional: She is oriented to person, place, and time. She appears well-developed and well-nourished. No distress.  HENT:  Head: Normocephalic and atraumatic.  Right Ear: Hearing, tympanic membrane, external ear and ear canal normal.  Left Ear: Hearing, tympanic membrane, external ear and ear canal normal.  Nose: Nose normal.  Mouth/Throat: Uvula is midline, oropharynx is clear and moist and mucous membranes are normal. No oropharyngeal exudate, posterior oropharyngeal edema or posterior oropharyngeal erythema.  Eyes: Conjunctivae and EOM are normal. Pupils are equal, round, and reactive to light. No scleral icterus.  Neck: Normal range  of motion. Neck supple. Carotid bruit is not present. No thyromegaly present.  Cardiovascular: Normal rate, regular rhythm, normal heart sounds and intact distal pulses.   No murmur heard. Pulses:       Radial pulses are 2+ on the right side, and 2+ on the left side.  Pulmonary/Chest: Effort normal and breath sounds normal. No respiratory distress. She has no wheezes. She has no rales.  Abdominal: Soft. Bowel sounds are normal. She exhibits no distension and no mass. There is no tenderness. There is no rebound and no guarding.  Musculoskeletal: Normal range of motion. She exhibits no edema.  Lymphadenopathy:    She has no cervical adenopathy.  Neurological: She is alert and oriented to person, place, and time.  CN grossly intact, station and gait intact Recall 3/3 Calculation 5/5 serial 7s  Skin: Skin is warm and dry. No rash noted.  Psychiatric: She has a normal mood and affect. Her behavior is normal. Judgment and thought content normal.  Nursing note and vitals reviewed.  Results for orders placed or performed in visit on 06/30/14  Lipid panel  Result Value Ref Range   Cholesterol 197 0 - 200 mg/dL   Triglycerides 98.0 0.0 - 149.0 mg/dL   HDL 55.60 >39.00 mg/dL   VLDL 19.6 0.0 - 40.0 mg/dL   LDL Cholesterol 122 (H) 0 - 99 mg/dL   Total CHOL/HDL Ratio 4    NonHDL 141.40   Comprehensive metabolic panel  Result Value Ref Range   Sodium 139 135 - 145 mEq/L   Potassium 4.4 3.5 - 5.1 mEq/L   Chloride 105 96 - 112 mEq/L   CO2 31 19 - 32 mEq/L   Glucose, Bld 94 70 - 99 mg/dL   BUN 9 6 - 23 mg/dL   Creatinine, Ser 1.21 (H) 0.40 - 1.20 mg/dL   Total Bilirubin 0.6 0.2 - 1.2 mg/dL   Alkaline Phosphatase 100 39 - 117 U/L   AST 16 0 - 37 U/L   ALT 13 0 - 35 U/L   Total Protein 7.2 6.0 - 8.3 g/dL   Albumin 4.1 3.5 - 5.2 g/dL   Calcium 9.5 8.4 - 10.5 mg/dL   GFR 46.94 (L) >60.00 mL/min  Vit D  25 hydroxy (rtn osteoporosis monitoring)  Result Value Ref Range   VITD 21.66 (L) 30.00 - 100.00 ng/mL      Assessment & Plan:   Problem List Items Addressed This Visit    Renal insufficiency    Newly found. No h/o HTN, DM. Check UA today, will return in 2-3 wks to recheck renal panel,  CBC, etc. Discussed importance of vit D.      Osteoporosis    Discussed with patient - will renew dexa and start bisphosphonate. Discussed pros/cons of bisphosphonate treatment including mechanism of action, optimal administration, and need to monitor for hip pain dental pain or atypical fractures. No upcoming dental work. Will need to monitor renal function on bisphosphonate Discussed dietary calcium ,encouraged she take cal supplements if not reaching dietary calcium goal.      Relevant Medications   alendronate (FOSAMAX) 70 MG tablet   Medicare annual wellness visit, initial - Primary    I have personally reviewed the Medicare Annual Wellness questionnaire and have noted 1. The patient's medical and social history 2. Their use of alcohol, tobacco or illicit drugs 3. Their current medications and supplements 4. The patient's functional ability including ADL's, fall risks, home safety risks and hearing or visual impairment. 5. Diet  and physical activity 6. Evidence for depression or mood disorders The patients weight, height, BMI have been recorded in the chart.  Hearing and vision has been addressed. I have made referrals, counseling and provided education to the patient based review of the above and I have provided the pt with a written personalized care plan for preventive services. Provider list updated - see scanned questionairre. Reviewed preventative protocols and updated unless pt declined.       HLD (hyperlipidemia)    Overall stable, reviewed #s.      Health maintenance examination    Preventative protocols reviewed and updated unless pt declined. Discussed healthy diet and lifestyle.       Advanced care planning/counseling discussion    Advanced directives - doesn't want prolonged life support.requests information on advanced directives. Packet provided today. Doesn't know who HCPOA would be.       Other Visit Diagnoses    Special screening for malignant  neoplasms, colon        Relevant Orders    Fecal occult blood, imunochemical        Follow up plan: Return in about 1 year (around 07/06/2015), or as needed, for medicare wellness visit.

## 2014-07-06 NOTE — Addendum Note (Signed)
Addended by: Royann Shivers A on: 07/06/2014 03:03 PM   Modules accepted: Orders

## 2014-07-08 ENCOUNTER — Encounter: Payer: Self-pay | Admitting: *Deleted

## 2014-07-26 ENCOUNTER — Other Ambulatory Visit: Payer: Self-pay | Admitting: Family Medicine

## 2014-07-26 ENCOUNTER — Other Ambulatory Visit (INDEPENDENT_AMBULATORY_CARE_PROVIDER_SITE_OTHER): Payer: Commercial Managed Care - HMO

## 2014-07-26 DIAGNOSIS — N289 Disorder of kidney and ureter, unspecified: Secondary | ICD-10-CM

## 2014-07-26 LAB — CBC WITH DIFFERENTIAL/PLATELET
BASOS PCT: 0.6 % (ref 0.0–3.0)
Basophils Absolute: 0 10*3/uL (ref 0.0–0.1)
EOS ABS: 0.1 10*3/uL (ref 0.0–0.7)
EOS PCT: 2 % (ref 0.0–5.0)
HCT: 40.5 % (ref 36.0–46.0)
Hemoglobin: 13.7 g/dL (ref 12.0–15.0)
LYMPHS PCT: 30.2 % (ref 12.0–46.0)
Lymphs Abs: 1.7 10*3/uL (ref 0.7–4.0)
MCHC: 33.8 g/dL (ref 30.0–36.0)
MCV: 85.3 fl (ref 78.0–100.0)
MONO ABS: 0.4 10*3/uL (ref 0.1–1.0)
Monocytes Relative: 6.9 % (ref 3.0–12.0)
NEUTROS PCT: 60.3 % (ref 43.0–77.0)
Neutro Abs: 3.4 10*3/uL (ref 1.4–7.7)
Platelets: 227 10*3/uL (ref 150.0–400.0)
RBC: 4.75 Mil/uL (ref 3.87–5.11)
RDW: 13.6 % (ref 11.5–15.5)
WBC: 5.6 10*3/uL (ref 4.0–10.5)

## 2014-07-26 LAB — RENAL FUNCTION PANEL
ALBUMIN: 4.1 g/dL (ref 3.5–5.2)
BUN: 10 mg/dL (ref 6–23)
CHLORIDE: 104 meq/L (ref 96–112)
CO2: 29 mEq/L (ref 19–32)
Calcium: 9.5 mg/dL (ref 8.4–10.5)
Creatinine, Ser: 0.66 mg/dL (ref 0.40–1.20)
GFR: 94.46 mL/min (ref >60.00)
Glucose, Bld: 88 mg/dL (ref 70–99)
Phosphorus: 3.2 mg/dL (ref 2.3–4.6)
Potassium: 3.9 mEq/L (ref 3.5–5.1)
SODIUM: 139 meq/L (ref 135–145)

## 2014-07-26 LAB — TSH: TSH: 1.25 u[IU]/mL (ref 0.35–4.50)

## 2014-07-26 LAB — VITAMIN D 25 HYDROXY (VIT D DEFICIENCY, FRACTURES): VITD: 16.63 ng/mL — ABNORMAL LOW (ref 30.00–100.00)

## 2014-07-27 LAB — PTH, INTACT AND CALCIUM
Calcium: 9.4 mg/dL (ref 8.4–10.5)
PTH: 59 pg/mL (ref 14–64)

## 2014-07-30 ENCOUNTER — Other Ambulatory Visit: Payer: Commercial Managed Care - HMO

## 2014-07-30 DIAGNOSIS — Z1211 Encounter for screening for malignant neoplasm of colon: Secondary | ICD-10-CM

## 2014-07-30 LAB — FECAL OCCULT BLOOD, IMMUNOCHEMICAL: Fecal Occult Bld: NEGATIVE

## 2014-07-30 LAB — FECAL OCCULT BLOOD, GUAIAC: Fecal Occult Blood: NEGATIVE

## 2014-08-02 ENCOUNTER — Other Ambulatory Visit: Payer: Self-pay | Admitting: Family Medicine

## 2014-08-02 MED ORDER — VITAMIN D3 25 MCG (1000 UT) PO CAPS: 1.0000 | ORAL_CAPSULE | Freq: Every day | ORAL | Status: DC

## 2014-08-02 MED ORDER — VITAMIN D (ERGOCALCIFEROL) 1.25 MG (50000 UNIT) PO CAPS: 50000.0000 [IU] | ORAL_CAPSULE | ORAL | Status: DC

## 2014-08-03 ENCOUNTER — Encounter: Payer: Self-pay | Admitting: *Deleted

## 2015-02-02 ENCOUNTER — Ambulatory Visit (INDEPENDENT_AMBULATORY_CARE_PROVIDER_SITE_OTHER): Payer: Commercial Managed Care - HMO | Admitting: Podiatry

## 2015-02-02 ENCOUNTER — Ambulatory Visit (INDEPENDENT_AMBULATORY_CARE_PROVIDER_SITE_OTHER): Payer: Commercial Managed Care - HMO

## 2015-02-02 ENCOUNTER — Encounter: Payer: Self-pay | Admitting: Podiatry

## 2015-02-02 VITALS — BP 103/59 | HR 68 | Resp 16

## 2015-02-02 DIAGNOSIS — M79672 Pain in left foot: Secondary | ICD-10-CM

## 2015-02-02 DIAGNOSIS — M722 Plantar fascial fibromatosis: Secondary | ICD-10-CM | POA: Diagnosis not present

## 2015-02-02 DIAGNOSIS — M79671 Pain in right foot: Secondary | ICD-10-CM

## 2015-02-02 MED ORDER — TRIAMCINOLONE ACETONIDE 10 MG/ML IJ SUSP
10.0000 mg | Freq: Once | INTRAMUSCULAR | Status: AC
  Administered 2015-02-02: 10 mg

## 2015-02-02 NOTE — Progress Notes (Signed)
Subjective:     Patient ID: Katherine Boone, female   DOB: [birth year], 69 y.o.   MRN: [id number]  HPI patient states I've had several months of pain in my right heel that's been worse in the mornings and after periods of sitting. I've tried supportive shoe gear without relief   Review of Systems  All other systems reviewed and are negative.      Objective:   Physical Exam  Constitutional: She is oriented to person, place, and time.  Cardiovascular: Intact distal pulses.   Musculoskeletal: Normal range of motion.  Neurological: She is oriented to person, place, and time.  Skin: Skin is warm.  Nursing note and vitals reviewed.  neurovascular status intact muscle strength adequate range of motion within normal limits with severe discomfort in the medial fascial band right at the insertional point of the tendon into the calcaneus. Good digital perfusion noted     Assessment:     Plantar fasciitis acute right    Plan:     H&P and x-rays reviewed with patient. Injected the right plantar fascia 3 mg Kenalog 5 mill grams Xylocaine and applied fascial brace with instructions along with physical therapy and supportive shoe gear recommendations. Reappoint to recheck in 2 weeks

## 2015-02-02 NOTE — Patient Instructions (Signed)

## 2015-02-02 NOTE — Progress Notes (Signed)
   Subjective:    Patient ID: Katherine Boone, female    DOB: Oct 25, 1945, 69 y.o.   MRN: [id number]  HPI Pt presents with right heel pain on medial side that radiates toward arch. Pain is worse in the mornings   Review of Systems  All other systems reviewed and are negative.      Objective:   Physical Exam        Assessment & Plan:

## 2015-02-11 ENCOUNTER — Ambulatory Visit (INDEPENDENT_AMBULATORY_CARE_PROVIDER_SITE_OTHER): Payer: Commercial Managed Care - HMO | Admitting: Podiatry

## 2015-02-11 ENCOUNTER — Encounter: Payer: Self-pay | Admitting: Podiatry

## 2015-02-11 DIAGNOSIS — M722 Plantar fascial fibromatosis: Secondary | ICD-10-CM | POA: Diagnosis not present

## 2015-02-11 MED ORDER — TRIAMCINOLONE ACETONIDE 10 MG/ML IJ SUSP
10.0000 mg | Freq: Once | INTRAMUSCULAR | Status: AC
  Administered 2015-02-11: 10 mg

## 2015-02-13 NOTE — Progress Notes (Signed)
Subjective:     Patient ID: Katherine Boone, female   DOB: [birth year], 69 y.o.   MRN: [id number]  HPI patient states I'm improved but I'm having pain now more on the outside of the right foot with the top doing better   Review of Systems     Objective:   Physical Exam Neurovascular status intact muscle strength adequate with good results from previous injection and physical therapy    Assessment:     Advised on continued usage of heat ice therapy and advised on the fact this is tendinitis    Plan:     Careful injection of the lateral side of the foot 3 mg Kenalog 5 mg Xylocaine around the peroneal tendon administered and reappoint to recheck if symptoms persist

## 2015-04-25 ENCOUNTER — Telehealth: Payer: Self-pay | Admitting: Family Medicine

## 2015-04-25 NOTE — Telephone Encounter (Signed)
[facility]  Patient Name: Katherine Boone  DOB: 08-07-1945    Initial Comment Constipated, vomiting   Nurse Assessment  Nurse: Harlow Mares, RN, Suanne Marker Date/Time Eilene Ghazi Time): 04/25/2015 2:07:58 PM  Confirm and document reason for call. If symptomatic, describe symptoms. You must click the next button to save text entered. ---Constipated, vomiting. Reports that she took a laxative yesterday and she is only having some colored water results. First symptoms began last Monday. She felt constipated, tried Fleet enemas with no results. She was able to manually remove a small amount of stool. She took some other meds for constipation, she then began to feel nauseated and vomited x1 Friday night. Reports that she feels bloated and she is not having gas or BM. Reports that she has never had constipation in the past. She feels that something isn't right. Denies abd discomfort at this time, but on occasion she will have cramps.  Has the patient traveled out of the country within the last 30 days? ---No  Does the patient have any new or worsening symptoms? ---Yes  Will a triage be completed? ---Yes  Related visit to physician within the last 2 weeks? ---No  Does the PT have any chronic conditions? (i.e. diabetes, asthma, etc.) ---Yes  List chronic conditions. ---osteoporosis  Is this a behavioral health or substance abuse call? ---No     Guidelines    Guideline Title Affirmed Question Affirmed Notes  Constipation Abdomen is more swollen than usual    Final Disposition User   See Physician within 24 Hours Harlow Mares, RN, [employer]    Referrals  REFERRED TO PCP OFFICE   Disagree/Comply: Leta Baptist

## 2015-04-25 NOTE — Telephone Encounter (Signed)
Spoke with patient Reviewed reasons to seek ER care - ie concern for obstruction Otherwise will see tomorrow

## 2015-04-25 NOTE — Telephone Encounter (Signed)
Pt has appt on 04/26/15 at 2:45 with Dr Danise Mina.

## 2015-04-26 ENCOUNTER — Telehealth: Payer: Self-pay | Admitting: Internal Medicine

## 2015-04-26 ENCOUNTER — Ambulatory Visit (INDEPENDENT_AMBULATORY_CARE_PROVIDER_SITE_OTHER)
Admission: RE | Admit: 2015-04-26 | Discharge: 2015-04-26 | Disposition: A | Discharge status: Home / Self Care | Payer: Commercial Managed Care - HMO | Source: Ambulatory Visit | Attending: Family Medicine | Admitting: Family Medicine

## 2015-04-26 ENCOUNTER — Other Ambulatory Visit
Admission: RE | Admit: 2015-04-26 | Discharge: 2015-04-26 | Disposition: A | Discharge status: Home / Self Care | Payer: Commercial Managed Care - HMO | Source: Ambulatory Visit | Attending: Family Medicine | Admitting: Family Medicine

## 2015-04-26 ENCOUNTER — Ambulatory Visit (INDEPENDENT_AMBULATORY_CARE_PROVIDER_SITE_OTHER): Payer: Commercial Managed Care - HMO | Admitting: Family Medicine

## 2015-04-26 ENCOUNTER — Encounter: Payer: Self-pay | Admitting: Family Medicine

## 2015-04-26 ENCOUNTER — Ambulatory Visit
Admission: RE | Admit: 2015-04-26 | Discharge: 2015-04-26 | Disposition: A | Discharge status: Home / Self Care | Payer: Commercial Managed Care - HMO | Source: Ambulatory Visit | Attending: Family Medicine | Admitting: Family Medicine

## 2015-04-26 VITALS — BP 110/74 | HR 68 | Temp 97.2°F | Wt 169.0 lb

## 2015-04-26 DIAGNOSIS — R918 Other nonspecific abnormal finding of lung field: Secondary | ICD-10-CM | POA: Insufficient documentation

## 2015-04-26 DIAGNOSIS — R1111 Vomiting without nausea: Secondary | ICD-10-CM | POA: Diagnosis not present

## 2015-04-26 DIAGNOSIS — K5900 Constipation, unspecified: Secondary | ICD-10-CM

## 2015-04-26 DIAGNOSIS — C799 Secondary malignant neoplasm of unspecified site: Secondary | ICD-10-CM

## 2015-04-26 DIAGNOSIS — Z809 Family history of malignant neoplasm, unspecified: Secondary | ICD-10-CM

## 2015-04-26 DIAGNOSIS — R590 Localized enlarged lymph nodes: Secondary | ICD-10-CM | POA: Insufficient documentation

## 2015-04-26 DIAGNOSIS — R1030 Lower abdominal pain, unspecified: Secondary | ICD-10-CM | POA: Diagnosis not present

## 2015-04-26 DIAGNOSIS — R1900 Intra-abdominal and pelvic swelling, mass and lump, unspecified site: Secondary | ICD-10-CM | POA: Insufficient documentation

## 2015-04-26 DIAGNOSIS — K5909 Other constipation: Secondary | ICD-10-CM | POA: Insufficient documentation

## 2015-04-26 DIAGNOSIS — R109 Unspecified abdominal pain: Secondary | ICD-10-CM | POA: Diagnosis not present

## 2015-04-26 LAB — COMPREHENSIVE METABOLIC PANEL
ALK PHOS: 113 U/L (ref 38–126)
ALT: 19 U/L (ref 14–54)
ANION GAP: 9 (ref 5–15)
AST: 26 U/L (ref 15–41)
Albumin: 4.5 g/dL (ref 3.5–5.0)
BUN: 10 mg/dL (ref 6–20)
CALCIUM: 9 mg/dL (ref 8.9–10.3)
CO2: 26 mmol/L (ref 22–32)
Chloride: 104 mmol/L (ref 101–111)
Creatinine, Ser: 0.56 mg/dL (ref 0.44–1.00)
GFR calc non Af Amer: >60 mL/min (ref >60)
Glucose, Bld: 88 mg/dL (ref 65–99)
Potassium: 3.9 mmol/L (ref 3.5–5.1)
Sodium: 139 mmol/L (ref 135–145)
TOTAL PROTEIN: 8 g/dL (ref 6.5–8.1)
Total Bilirubin: 0.4 mg/dL (ref 0.3–1.2)

## 2015-04-26 LAB — CBC WITH DIFFERENTIAL/PLATELET
BASOS ABS: 0 10*3/uL (ref 0–0.1)
BASOS PCT: 0 %
Eosinophils Absolute: 0.1 10*3/uL (ref 0–0.7)
Eosinophils Relative: 1 %
HEMATOCRIT: 41.1 % (ref 35.0–47.0)
HEMOGLOBIN: 13.6 g/dL (ref 12.0–16.0)
Lymphocytes Relative: 20 %
Lymphs Abs: 1.3 10*3/uL (ref 1.0–3.6)
MCH: 28.5 pg (ref 26.0–34.0)
MCHC: 33.2 g/dL (ref 32.0–36.0)
MCV: 85.8 fL (ref 80.0–100.0)
MONOS PCT: 9 %
Monocytes Absolute: 0.6 10*3/uL (ref 0.2–0.9)
NEUTROS ABS: 4.5 10*3/uL (ref 1.4–6.5)
NEUTROS PCT: 70 %
Platelets: 223 10*3/uL (ref 150–440)
RBC: 4.79 MIL/uL (ref 3.80–5.20)
RDW: 13.6 % (ref 11.5–14.5)
WBC: 6.5 10*3/uL (ref 3.6–11.0)

## 2015-04-26 LAB — LIPASE, BLOOD: Lipase: 37 U/L (ref 11–51)

## 2015-04-26 MED ORDER — IOHEXOL 300 MG/ML  SOLN
100.0000 mL | Freq: Once | INTRAMUSCULAR | Status: AC | PRN
Start: 2015-04-26 — End: 2015-04-26
  Administered 2015-04-26: 100 mL via INTRAVENOUS

## 2015-04-26 NOTE — Patient Instructions (Addendum)
Pass by Marion's office to schedule CT abdomen and pelvis Labs today.  We will call you with results.

## 2015-04-26 NOTE — Addendum Note (Signed)
Addended by: [name] on: 04/26/2015 09:06 PM   Modules accepted: Orders

## 2015-04-26 NOTE — Telephone Encounter (Signed)
Received a call from the answering service - asked to call the radiologist to discuss Katherine Boone's Ct scan she had done today.  She has a large pelvic mass - probably uterine cancer and metastatic lesions to liver and lung.  I spoke with Katherine Boone and discussed the Ct scan.  She is aware of the results and that it is likely stage 4.  Advised her Dr. Danise Mina will be in touch with her tomorrow to arrange an appointment with an oncologist.

## 2015-04-26 NOTE — Telephone Encounter (Addendum)
Spoke with patient.  CT showing diffusely metastatic pelvic cancer. Discussed likely poor prognosis.  Will expedite oncology referral for further eval.

## 2015-04-26 NOTE — Assessment & Plan Note (Signed)
No obstruction on plain films

## 2015-04-26 NOTE — Progress Notes (Addendum)
BP 110/74 mmHg  Pulse 68  Temp(Src) 97.2 F (36.2 C) (Tympanic)  Wt 169 lb (76.658 kg)  SpO2 96%   CC: abd pain with constipation  Subjective:    Patient ID: Silvana Newness, female    DOB: May 18, 1945, 70 y.o.   MRN: [id number]  HPI: BRENNYN GUARINO is a 70 y.o. female presenting on 04/26/2015 for OTHER   Last normal stool Monday 04/18/2015. abd pain/cramping and nausea/vomiting with attempts with bowel movement. Over weekend passed small amt stool. Difficulty even with passing gas. Feels more bloated. Back aches. + chills.  Taking senna, fleet enema, all unsuccessfully.  No h/o constipation.  Over last month noticing more abd discomfort each stool. Voiding well. No fevers. Ate chocolate pudding and cup of water for lunch.   Strong famhx GI cancer. Pt declined colonoscopy but has had normal iFOB 2012, 2016.   Relevant past medical, surgical, family and social history reviewed and updated as indicated. Interim medical history since our last visit reviewed. Allergies and medications reviewed and updated. Current Outpatient Prescriptions on File Prior to Visit  Medication Sig  . alendronate (FOSAMAX) 70 MG tablet Take 1 tablet (70 mg total) by mouth every 7 (seven) days. Take with a full glass of water on an empty stomach.  . Calcium Carbonate-Vit D-Min 1200-1000 MG-UNIT CHEW Chew 1 tablet by mouth daily.  . Cholecalciferol (VITAMIN D3) 1000 UNITS CAPS Take 1 capsule (1,000 Units total) by mouth daily.  . cyanocobalamin 100 MCG tablet Take 100 mcg by mouth daily.   No current facility-administered medications on file prior to visit.    Review of Systems Per HPI unless specifically indicated in ROS section     Objective:    BP 110/74 mmHg  Pulse 68  Temp(Src) 97.2 F (36.2 C) (Tympanic)  Wt 169 lb (76.658 kg)  SpO2 96%  Wt Readings from Last 3 Encounters:  04/26/15 169 lb (76.658 kg)  07/06/14 176 lb 12.8 oz (80.196 kg)  11/20/12 182 lb 12 oz (82.895 kg)    Physical  Exam  Constitutional: She appears well-developed and well-nourished. No distress.  HENT:  Mouth/Throat: Oropharynx is clear and moist. No oropharyngeal exudate.  Abdominal: Soft. Normal appearance and bowel sounds are normal. She exhibits distension. She exhibits no mass. There is no hepatosplenomegaly. There is tenderness in the suprapubic area, left upper quadrant and left lower quadrant. There is guarding. There is no rigidity, no rebound, no CVA tenderness and negative Murphy's sign.  Genitourinary: Rectum normal. Rectal exam shows no external hemorrhoid, no internal hemorrhoid, no fissure, no mass, no tenderness and anal tone normal.  No stool in rectal vault Anterior to rectal vault there is mass felt, presumed uterus  Musculoskeletal: She exhibits no edema.  Skin: Skin is warm and dry. No rash noted.  Nursing note and vitals reviewed.  Results for orders placed or performed in visit on 08/03/14  Fecal Occult Blood, Guaiac  Result Value Ref Range   Fecal Occult Blood Negative    Lab Results  Component Value Date   CREATININE 0.66 07/26/2014    ABDOMEN - 2 VIEW COMPARISON: No recent studies in PACs FINDINGS: The colonic stool burden is moderate. There is no small or large bowel obstructive pattern. No free extraluminal gas collections are observed. There are phleboliths within the pelvis. The lung bases are clear. The bony structures exhibit no acute abnormality. IMPRESSION: There is no evidence of bowel obstruction or ileus. The colonic stool burden is moderate and could  reflect clinical constipation. Electronically Signed  By: David Martinique M.D.  On: 04/26/2015 15:26    Assessment & Plan:   Problem List Items Addressed This Visit    Lower abdominal pain - Primary    abd xray today without evidence of obstruction - but with persistent lower abd pain, constipation, chills, ?diverticulitis. Check CBC, CMP, lipase and abd/pelvic CT scan to help r/o this vs other cause  like SBO. Pt agrees with plan. Strong fmhx GI cancer.      Relevant Orders   Comprehensive metabolic panel   CBC with Differential/Platelet   Lipase   CT Abdomen Pelvis W Contrast   Family history of cancer   Constipation    No obstruction on plain films      Relevant Orders   DG Abd 2 Views (Completed)   Comprehensive metabolic panel   CBC with Differential/Platelet   Lipase   CT Abdomen Pelvis W Contrast       Follow up plan: No Follow-up on file.

## 2015-04-26 NOTE — Addendum Note (Signed)
Addended by: Marchia Bond on: 04/26/2015 03:59 PM   Modules accepted: Orders

## 2015-04-26 NOTE — Assessment & Plan Note (Signed)
abd xray today without evidence of obstruction - but with persistent lower abd pain, constipation, chills, ?diverticulitis. Check CBC, CMP, lipase and abd/pelvic CT scan to help r/o this vs other cause like SBO. Pt agrees with plan. Strong fmhx GI cancer.

## 2015-04-27 ENCOUNTER — Telehealth: Payer: Self-pay | Admitting: Family Medicine

## 2015-04-27 NOTE — Telephone Encounter (Signed)
Appt made with Dr Marti Sleigh on 04/29/15 at 1:45pm, patient aware and [facility] Authorization completed.

## 2015-04-27 NOTE — Telephone Encounter (Signed)
Would offer PPI - if desired may send in omeprazole 20mg  once daily to pharmacy [id number] for possible GERD. rec hold fosamax for now.

## 2015-04-27 NOTE — Telephone Encounter (Signed)
Called the patient to give her the Appt with Dr Marti Sleigh this Friday 04/29/15 at 1:45pm. She said she is having some burning like acid reflux in her throat and also that she had a small bowel movement last night, stools moving a little. Told her to call us if she needs anything at all.

## 2015-04-28 MED ORDER — OMEPRAZOLE 20 MG PO CPDR: 20.0000 mg | DELAYED_RELEASE_CAPSULE | Freq: Every day | ORAL | Status: DC

## 2015-04-28 NOTE — Telephone Encounter (Signed)
Pt advised of Dr. Synthia Innocent comments/recommendations and verbalized understanding. Pt did want to try omeprazole so Rx sent to pharmacy

## 2015-04-29 ENCOUNTER — Ambulatory Visit: Payer: Commercial Managed Care - HMO | Attending: Gynecology | Admitting: Gynecology

## 2015-04-29 ENCOUNTER — Other Ambulatory Visit: Payer: Commercial Managed Care - HMO

## 2015-04-29 ENCOUNTER — Encounter: Payer: Self-pay | Admitting: Gynecology

## 2015-04-29 VITALS — BP 105/71 | HR 89 | Temp 98.2°F | Resp 18 | Ht 65.75 in | Wt 166.7 lb

## 2015-04-29 DIAGNOSIS — R1900 Intra-abdominal and pelvic swelling, mass and lump, unspecified site: Secondary | ICD-10-CM | POA: Insufficient documentation

## 2015-04-29 DIAGNOSIS — R918 Other nonspecific abnormal finding of lung field: Secondary | ICD-10-CM | POA: Diagnosis not present

## 2015-04-29 DIAGNOSIS — Z801 Family history of malignant neoplasm of trachea, bronchus and lung: Secondary | ICD-10-CM | POA: Insufficient documentation

## 2015-04-29 DIAGNOSIS — M810 Age-related osteoporosis without current pathological fracture: Secondary | ICD-10-CM | POA: Diagnosis not present

## 2015-04-29 DIAGNOSIS — K769 Liver disease, unspecified: Secondary | ICD-10-CM | POA: Diagnosis not present

## 2015-04-29 DIAGNOSIS — K219 Gastro-esophageal reflux disease without esophagitis: Secondary | ICD-10-CM | POA: Insufficient documentation

## 2015-04-29 DIAGNOSIS — Z800 Family history of malignant neoplasm of digestive organs: Secondary | ICD-10-CM | POA: Diagnosis not present

## 2015-04-29 DIAGNOSIS — R590 Localized enlarged lymph nodes: Secondary | ICD-10-CM | POA: Diagnosis not present

## 2015-04-29 NOTE — Patient Instructions (Signed)
You will receive a phone call from Radiology with an appointment date and time to have a biopsy.  We will call you with the results of your biopsy.  We will also arrange for you to meet with Dr. Evlyn Clines, Medical Oncologist, to discuss initiating chemotherapy.  Please call for any questions or concerns.

## 2015-04-29 NOTE — Progress Notes (Signed)
Consult Note: Gyn-Onc   Katherine Boone 71 y.o. female  Chief Complaint  Patient presents with  . Pelvic Mass    New Consultation    AssessmentAnd plan :The CT scans reviewed and based on those findings I am very suspicious that this is a gynecologic malignancy most likely of ovarian origin. Given the extent of disease I do not think surgical intervention at this juncture would be of value. We'll plan on proceeding with establishing a histologic diagnosis through a fine-needle aspirate of the pelvic mass. Once the diagnosis is confirmed with the patient would best be managed with neoadjuvant chemotherapy.     HPI:70 year old white married female seen in consultation request of Dr.Javier Danise Mina for management of a newly diagnosed advanced carcinoma likely of gynecologic origin.  The patient initially presented on 04/26/2015 with GI symptoms especially constipation and difficulty having bowel movements and passing gas. In retrospect the patient reports that she's had "pain in her ovaries" since this past summer". CT scan showed the following findings: Numerous bilateral pulmonary nodules consistent with metastatic disease are associated with liver lesions, omental nodules, mesenteric nodules, necrotic retroperitoneal lymphadenopathy in a mixed cystic and solid 10 x 13 cm mass in the central pelvis involving the uterus extending into the cul-de-sac and both adnexal regions.  Patient reports that she is having some bowel movements now that she is using Merilax. She denies any nausea or vomiting. She does have some lower abdominal discomfort. Apparently there is a number of family members that have had "stomach cancers" (both men and women). However there is no known ovarian or breast cancer history. The patient denies any past gynecologic history includes she had annual exams for many years. Review of Systems:10 point review of systems is negative except as noted in interval history.    Vitals: Blood pressure 105/71, pulse 89, temperature 98.2 F (36.8 C), temperature source Oral, resp. rate 18, height 5' 5.75" (1.67 m), weight 166 lb 11.2 oz (75.615 kg), SpO2 97 %.  Physical Exam: General : The patient is a healthy woman in no acute distress.  HEENT: normocephalic, extraoccular movements normal; neck is supple without thyromegally  Lynphnodes: Supraclavicular and inguinal nodes not enlarged  Abdomen: Soft, non-tender, no ascites, no organomegally, no masses, no hernias  Pelvic:  EGBUS: Normal female  Vagina: Normal, no lesions  Urethra and Bladder: Normal, non-tender  Cervix: Surgically absent  Uterus: Surgically absent  Bi-manual examination: Non-tender; no adenxal masses or nodularity  Rectal: normal sphincter tone, no masses, no blood  Lower extremities: No edema or varicosities. Normal range of motion      No Known Allergies  Past Medical History  Diagnosis Date  . GERD (gastroesophageal reflux disease)     diet controlled  . Migraines     none recently  . Family history of cancer     stomach, colon, lung, throat  . Gout 03/2005    ?  Marland Kitchen Osteoporosis 02/2011    DEXA 12/12 - Lspine -2.6; femur -3.8    Past Surgical History  Procedure Laterality Date  . Appendectomy  1960    Current Outpatient Prescriptions  Medication Sig Dispense Refill  . omeprazole (PRILOSEC) 20 MG capsule Take 1 capsule (20 mg total) by mouth daily. 30 capsule 3  . alendronate (FOSAMAX) 70 MG tablet Take 1 tablet (70 mg total) by mouth every 7 (seven) days. Take with a full glass of water on an empty stomach. (Patient not taking: Reported on 04/29/2015) 4 tablet 11  .  Calcium Carbonate-Vit D-Min 1200-1000 MG-UNIT CHEW Chew 1 tablet by mouth daily. (Patient not taking: Reported on 04/29/2015)    . Cholecalciferol (VITAMIN D3) 1000 UNITS CAPS Take 1 capsule (1,000 Units total) by mouth daily. (Patient not taking: Reported on 04/29/2015) 30 capsule   . cyanocobalamin 100 MCG  tablet Take 100 mcg by mouth daily. Reported on 04/29/2015     No current facility-administered medications for this visit.    Social History   Social History  . Marital Status: Legally Separated    Spouse Name: N/A  . Number of Children: 2  . Years of Education: N/A   Occupational History  .  [employer]   Social History Main Topics  . Smoking status: Never Smoker   . Smokeless tobacco: Never Used  . Alcohol Use: No  . Drug Use: No  . Sexual Activity: Not on file   Other Topics Concern  . 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 Heart disease Mother   . Hypertension Mother   . Cancer Mother 2    colon and stomach  . Cancer Brother     stomach and throat, smoker  . Ovarian cancer      maternal neice  . Hypertension Sister   . Cancer Brother     stomach and lung, smoker  . COPD Brother   . Throat cancer Brother   . Cancer Father     stomach cancer  . Stroke Sister   . Diabetes Neg Hx   . Coronary artery disease Neg Hx   . Cancer Other     niece - stomach cancer      Alvino Chapel, MD 04/29/2015, 1:46 PM       Consult Note: Gyn-Onc   Katherine Boone 70 y.o. female  Chief Complaint  Patient presents with  . Pelvic Mass    New Consultation    Assessment :  Plan:  Interval History:   HPI:  Review of Systems:10 point review of systems is negative except as noted in interval history.   Vitals: Blood pressure 105/71, pulse 89, temperature 98.2 F (36.8 C), temperature source Oral, resp. rate 18, height 5' 5.75" (1.67 m), weight 166 lb 11.2 oz  (75.615 kg), SpO2 97 %.  Physical Exam: General : The patient is a healthy woman in no acute distress.  HEENT: normocephalic, extraoccular movements normal; neck is supple without thyromegally  Lynphnodes: Supraclavicular and inguinal nodes not enlarged  Abdomen: Soft, non-tender, no ascites, no organomegally, no masses, no hernias  Pelvic:  EGBUS: Normal female  Vagina: Normal, no lesions  Urethra and Bladder: Normal, non-tender  Cervix: Surgically absent  Uterus: Surgically absent  Bi-manual examination: Non-tender; no adenxal masses or nodularity  Rectal: normal sphincter tone, no masses, no blood  Lower extremities: No edema or varicosities. Normal range of motion      No Known Allergies  Past Medical History  Diagnosis Date  . GERD (gastroesophageal reflux disease)     diet controlled  . Migraines     none recently  .  Family history of cancer     stomach, colon, lung, throat  . Gout 03/2005    ?  Marland Kitchen Osteoporosis 02/2011    DEXA 12/12 - Lspine -2.6; femur -3.8    Past Surgical History  Procedure Laterality Date  . Appendectomy  1960    Current Outpatient Prescriptions  Medication Sig Dispense Refill  . omeprazole (PRILOSEC) 20 MG capsule Take 1 capsule (20 mg total) by mouth daily. 30 capsule 3  . alendronate (FOSAMAX) 70 MG tablet Take 1 tablet (70 mg total) by mouth every 7 (seven) days. Take with a full glass of water on an empty stomach. (Patient not taking: Reported on 04/29/2015) 4 tablet 11  . Calcium Carbonate-Vit D-Min 1200-1000 MG-UNIT CHEW Chew 1 tablet by mouth daily. (Patient not taking: Reported on 04/29/2015)    . Cholecalciferol (VITAMIN D3) 1000 UNITS CAPS Take 1 capsule (1,000 Units total) by mouth daily. (Patient not taking: Reported on 04/29/2015) 30 capsule   . cyanocobalamin 100 MCG tablet Take 100 mcg by mouth daily. Reported on 04/29/2015     No current facility-administered medications for this visit.    Social History   Social History  .  Marital Status: Legally Separated    Spouse Name: N/A  . Number of Children: 2  . Years of Education: N/A   Occupational History  .  [employer]   Social History Main Topics  . Smoking status: Never Smoker   . Smokeless tobacco: Never Used  . Alcohol Use: No  . Drug Use: No  . Sexual Activity: Not on file   Other Topics Concern  . 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 Heart disease Mother   . Hypertension Mother   . Cancer Mother 57    colon and stomach  . Cancer Brother     stomach and throat, smoker  . Ovarian cancer      maternal neice  . Hypertension Sister   . Cancer Brother     stomach and lung, smoker  . COPD Brother   . Throat cancer Brother   . Cancer Father     stomach cancer  . Stroke Sister   . Diabetes Neg Hx   . Coronary artery disease Neg Hx   . Cancer Other     niece - stomach cancer      Alvino Chapel, MD 04/29/2015, 1:46 PM

## 2015-04-30 LAB — CA 125: Cancer Antigen (CA) 125: 105.7 U/mL — ABNORMAL HIGH (ref 0.0–38.1)

## 2015-04-30 LAB — CEA: CEA1: 1.1 ng/mL (ref 0.0–4.7)

## 2015-05-03 ENCOUNTER — Other Ambulatory Visit: Payer: Self-pay | Admitting: Radiology

## 2015-05-03 ENCOUNTER — Other Ambulatory Visit: Payer: Self-pay | Admitting: General Surgery

## 2015-05-04 ENCOUNTER — Encounter (HOSPITAL_COMMUNITY): Payer: Self-pay

## 2015-05-04 ENCOUNTER — Ambulatory Visit (HOSPITAL_COMMUNITY)
Admission: RE | Admit: 2015-05-04 | Discharge: 2015-05-04 | Disposition: A | Discharge status: Home / Self Care | Payer: Commercial Managed Care - HMO | Source: Ambulatory Visit | Attending: Gynecologic Oncology | Admitting: Gynecologic Oncology

## 2015-05-04 DIAGNOSIS — R634 Abnormal weight loss: Secondary | ICD-10-CM | POA: Insufficient documentation

## 2015-05-04 DIAGNOSIS — K219 Gastro-esophageal reflux disease without esophagitis: Secondary | ICD-10-CM | POA: Diagnosis not present

## 2015-05-04 DIAGNOSIS — Z808 Family history of malignant neoplasm of other organs or systems: Secondary | ICD-10-CM | POA: Diagnosis not present

## 2015-05-04 DIAGNOSIS — M810 Age-related osteoporosis without current pathological fracture: Secondary | ICD-10-CM | POA: Insufficient documentation

## 2015-05-04 DIAGNOSIS — C7989 Secondary malignant neoplasm of other specified sites: Secondary | ICD-10-CM | POA: Insufficient documentation

## 2015-05-04 DIAGNOSIS — K5900 Constipation, unspecified: Secondary | ICD-10-CM | POA: Diagnosis not present

## 2015-05-04 DIAGNOSIS — R1909 Other intra-abdominal and pelvic swelling, mass and lump: Secondary | ICD-10-CM | POA: Diagnosis not present

## 2015-05-04 DIAGNOSIS — R1900 Intra-abdominal and pelvic swelling, mass and lump, unspecified site: Secondary | ICD-10-CM

## 2015-05-04 DIAGNOSIS — Z801 Family history of malignant neoplasm of trachea, bronchus and lung: Secondary | ICD-10-CM | POA: Diagnosis not present

## 2015-05-04 DIAGNOSIS — C801 Malignant (primary) neoplasm, unspecified: Secondary | ICD-10-CM | POA: Diagnosis not present

## 2015-05-04 DIAGNOSIS — Z800 Family history of malignant neoplasm of digestive organs: Secondary | ICD-10-CM | POA: Insufficient documentation

## 2015-05-04 DIAGNOSIS — R140 Abdominal distension (gaseous): Secondary | ICD-10-CM | POA: Insufficient documentation

## 2015-05-04 DIAGNOSIS — Z7983 Long term (current) use of bisphosphonates: Secondary | ICD-10-CM | POA: Diagnosis not present

## 2015-05-04 DIAGNOSIS — R102 Pelvic and perineal pain: Secondary | ICD-10-CM | POA: Insufficient documentation

## 2015-05-04 LAB — CBC
HEMATOCRIT: 44 % (ref 36.0–46.0)
HEMOGLOBIN: 14.4 g/dL (ref 12.0–15.0)
MCH: 28.5 pg (ref 26.0–34.0)
MCHC: 32.7 g/dL (ref 30.0–36.0)
MCV: 87.1 fL (ref 78.0–100.0)
Platelets: 260 10*3/uL (ref 150–400)
RBC: 5.05 MIL/uL (ref 3.87–5.11)
RDW: 13.1 % (ref 11.5–15.5)
WBC: 6.5 10*3/uL (ref 4.0–10.5)

## 2015-05-04 LAB — PROTIME-INR
INR: 1.03 (ref 0.00–1.49)
PROTHROMBIN TIME: 13.7 s (ref 11.6–15.2)

## 2015-05-04 LAB — APTT: aPTT: 28 seconds (ref 24–37)

## 2015-05-04 MED ORDER — MIDAZOLAM HCL 2 MG/2ML IJ SOLN
INTRAMUSCULAR | Status: AC | PRN
  Administered 2015-05-04: 1 mg via INTRAVENOUS

## 2015-05-04 MED ORDER — HYDROCODONE-ACETAMINOPHEN 5-325 MG PO TABS: 1.0000 | ORAL_TABLET | ORAL | Status: DC | PRN

## 2015-05-04 MED ORDER — MIDAZOLAM HCL 2 MG/2ML IJ SOLN
INTRAMUSCULAR | Status: AC
  Filled 2015-05-04: qty 4

## 2015-05-04 MED ORDER — FENTANYL CITRATE (PF) 100 MCG/2ML IJ SOLN
INTRAMUSCULAR | Status: AC
  Filled 2015-05-04: qty 2

## 2015-05-04 MED ORDER — FENTANYL CITRATE (PF) 100 MCG/2ML IJ SOLN
INTRAMUSCULAR | Status: AC | PRN
  Administered 2015-05-04: 50 ug via INTRAVENOUS

## 2015-05-04 MED ORDER — SODIUM CHLORIDE 0.9 % IV SOLN
INTRAVENOUS | Status: DC
  Administered 2015-05-04: 12:00:00 via INTRAVENOUS

## 2015-05-04 NOTE — H&P (Signed)
Chief Complaint: Patient was seen in consultation today for CT-guided pelvic mass versus mesenteric mass biopsy  Referring Physician(s): Cross,Melissa D  Supervising Physician: Arne Cleveland  History of Present Illness: Katherine Boone is a 70 y.o. female with history of GERD, migraines, osteoporosis, and additional symptoms of weight loss, pelvic pain, bloating, and constipation. She has no known  history of malignancy. Subsequent CT scan of abdomen and pelvis performed on 04/26/15 has revealed numerous bilateral pulmonary nodules consistent with metastatic disease associated with liver lesions, omental nodules, mesenteric nodules, necrotic retroperitoneal lymphadenopathy and a mixed cystic and solid mass in the central pelvis involving the uterus and extending into the cul-de-sac and both adnexal regions. CEA is normal and CA 125 is elevated at 105.7. She presents today for CT-guided biopsy of a pelvic versus mesenteric mass for further evaluation.  Past Medical History  Diagnosis Date  . GERD (gastroesophageal reflux disease)     diet controlled  . Migraines     none recently  . Family history of cancer     stomach, colon, lung, throat  . Gout 03/2005    ?  Marland Kitchen Osteoporosis 02/2011    DEXA 12/12 - Lspine -2.6; femur -3.8    Past Surgical History  Procedure Laterality Date  . Appendectomy  1960    Allergies: Review of patient's allergies indicates no known allergies.  Medications: Prior to Admission medications   Medication Sig Start Date End Date Taking? Authorizing Provider  alendronate (FOSAMAX) 70 MG tablet Take 1 tablet (70 mg total) by mouth every 7 (seven) days. Take with a full glass of water on an empty stomach. Patient not taking: Reported on 04/29/2015 07/06/14   [name], MD  Calcium Carbonate-Vit D-Min 1200-1000 MG-UNIT CHEW Chew 1 tablet by mouth daily. Patient not taking: Reported on 04/29/2015 02/12/11   [name], MD  Cholecalciferol (VITAMIN  D3) 1000 UNITS CAPS Take 1 capsule (1,000 Units total) by mouth daily. 08/02/14   [name], MD  cyanocobalamin 100 MCG tablet Take 100 mcg by mouth daily. Reported on 04/29/2015    Historical Provider, MD  omeprazole (PRILOSEC) 20 MG capsule Take 1 capsule (20 mg total) by mouth daily. 04/28/15   [name], MD     Family History  Problem Relation Age of Onset  . Heart disease Mother   . Hypertension Mother   . Cancer Mother 66    colon and stomach  . Cancer Brother     stomach and throat, smoker  . Ovarian cancer      maternal neice  . Hypertension Sister   . Cancer Brother     stomach and lung, smoker  . COPD Brother   . Throat cancer Brother   . Cancer Father     stomach cancer  . Stroke Sister   . Diabetes Neg Hx   . Coronary artery disease Neg Hx   . Cancer Other     niece - stomach cancer    Social History   Social History  . Marital Status: Legally Separated    Spouse Name: N/A  . Number of Children: 2  . Years of Education: N/A   Occupational History  .  [employer]   Social History Main Topics  . Smoking status: Never Smoker   . Smokeless tobacco: Never Used  . Alcohol Use: No  . Drug Use: No  . Sexual Activity: Not on file   Other Topics Concern  . 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 Chest clear to auscultation bilaterally. Heart with regular rate and rhythm. Abdomen soft, positive bowel sounds, mildly tender  lower pelvic region; extremities with no significant edema.  Mallampati Score:     Imaging: Ct Abdomen Pelvis W Contrast  04/26/2015  ADDENDUM REPORT: 04/26/2015 20:36 ADDENDUM: I discussed these findings by telephone with Dr. Quay Burow, on call for Dr. Danise Mina, at approximately 07-04-2032 hours on 04/26/2015. Electronically Signed   By: Misty Stanley M.D.   On: 04/26/2015 20:36  04/26/2015  CLINICAL DATA:  Abdominal pain with nausea vomiting. EXAM: CT ABDOMEN AND PELVIS WITH CONTRAST TECHNIQUE: Multidetector CT imaging of the abdomen and pelvis was performed using the standard protocol following bolus administration of intravenous contrast. CONTRAST:  156mL OMNIPAQUE IOHEXOL 300 MG/ML  SOLN COMPARISON:  None. FINDINGS: Lower chest: Numerous bilateral pulmonary nodules are evident measuring in the 5-10 mm size range. 10 mm index nodule is seen in the posterior right lung base (image 11 series for. Index nodule in the left posterior lung base measures 11 mm on image 7 series 4. Hepatobiliary: 3.4 cm ill-defined lesion is identified in the medial segment left liver with attenuation higher than would be expected for a simple cyst. 11 mm ill-defined low-density lesion is seen in the inferior right liver on image 25 series 2. 6 mm low-density lesion seen in the medial right liver on image 23. There is no evidence for gallstones, gallbladder wall thickening, or pericholecystic fluid. No intrahepatic or extrahepatic biliary dilation. Pancreas: No focal mass lesion. No dilatation of the main duct. No intraparenchymal cyst. No peripancreatic edema. Spleen: No splenomegaly. No focal mass lesion. Adrenals/Urinary Tract: No adrenal nodule or mass. 5 mm low-density lesion in the upper pole right kidney is too small to characterize. 14 mm well-defined water density lesion in the interpolar left kidney is likely a cyst. No evidence for hydroureter. The urinary bladder appears normal for the degree of distention. Stomach/Bowel:  Stomach is nondistended. No gastric wall thickening. No evidence of outlet obstruction. Duodenum is normally positioned as is the ligament of Treitz. No small bowel wall thickening. No small bowel dilatation. The terminal ileum is normal. The appendix is not visualized, but there is no edema or inflammation in the region of the cecum. Sigmoid colon is tethered to a large a amorphous cystic and solid mass in the central anatomic pelvis. Vascular/Lymphatic: No abdominal aortic aneurysm. No abdominal atherosclerotic calcification. 1.9 x 3.0 cm necrotic right para-aortic lymph node is seen on image 30 series 2. Other necrotic retroperitoneal lymphadenopathy is seen towards the aortic bifurcation. Small lymph nodes are seen scattered in the pelvic sidewall bilaterally. Reproductive: 10.3 x 13.8 cm mixed cystic and solid mass lesion involves the uterus and extends posteriorly into the cul-de-sac and laterally into both adnexal regions. Other: Scattered necrotic nodules in lymph nodes are seen in the mesentery and omentum. 2.9 cm necrotic nodule is seen in the proximal sigmoid mesocolon (  image 63 series 2). No substantial intraperitoneal free fluid Musculoskeletal: Bone windows reveal no worrisome lytic or sclerotic osseous lesions. Superior endplate compression deformity is seen at T11 but there is no evidence of this represents a pathologic fracture. IMPRESSION: 1. Numerous bilateral pulmonary nodules consistent with metastatic disease are associated with liver lesions, omental nodules, mesenteric nodules, necrotic retroperitoneal lymphadenopathy in a mixed cystic and solid mass in the central pelvis involving the uterus extending into the cul-de-sac and both adnexal regions. Uterine primary (likely endometrial) is favored over an ovarian primary malignancy given the lack of ascites, associated intrahepatic metastases and pulmonary involvement. Electronically Signed: By: Misty Stanley M.D. On: 04/26/2015 19:41   Dg Abd  2 Views  04/26/2015  CLINICAL DATA:  Constipation, vomiting, abdominal pain EXAM: ABDOMEN - 2 VIEW COMPARISON:  No recent studies in [facility] FINDINGS: The colonic stool burden is moderate. There is no small or large bowel obstructive pattern. No free extraluminal gas collections are observed. There are phleboliths within the pelvis. The lung bases are clear. The bony structures exhibit no acute abnormality. IMPRESSION: There is no evidence of bowel obstruction or ileus. The colonic stool burden is moderate and could reflect clinical constipation. Electronically Signed   By: David  Martinique M.D.   On: 04/26/2015 15:26    Labs:  CBC:  Recent Labs  07/26/14 0933 04/26/15 1705 05/04/15 1130  WBC 5.6 6.5 6.5  HGB 13.7 13.6 14.4  HCT 40.5 41.1 44.0  PLT 227.0 223 260    COAGS: No results for input(s): INR, APTT in the last 8760 hours.  BMP:  Recent Labs  06/30/14 0855 07/26/14 0933 04/26/15 1705  NA 139 139 139  K 4.4 3.9 3.9  CL 105 104 104  CO2 31 29 26   GLUCOSE 94 88 88  BUN 9 10 10   CALCIUM 9.5 9.4  9.5 9.0  CREATININE 1.21* 0.66 0.56  GFRNONAA  --   --  >60  GFRAA  --   --  >60    LIVER FUNCTION TESTS:  Recent Labs  06/30/14 0855 07/26/14 0933 04/26/15 1705  BILITOT 0.6  --  0.4  AST 16  --  26  ALT 13  --  19  ALKPHOS 100  --  113  PROT 7.2  --  8.0  ALBUMIN 4.1 4.1 4.5    TUMOR MARKERS: No results for input(s): AFPTM, CEA, CA199, CHROMGRNA in the last 8760 hours.  Assessment and Plan: 70 y.o. female with history of GERD, migraines, osteoporosis, and additional symptoms of weight loss, pelvic pain, bloating, and constipation. She has no known history of malignancy. Subsequent CT scan of abdomen and pelvis performed on 04/26/15 has revealed numerous bilateral pulmonary nodules consistent with metastatic disease associated with liver lesions, omental nodules, mesenteric nodules, necrotic retroperitoneal lymphadenopathy and a mixed cystic and solid mass in the  central pelvis involving the uterus and extending into the cul-de-sac and both adnexal regions. CEA is normal and CA 125 is elevated at 105.7. She presents today for CT-guided biopsy of a pelvic versus mesenteric mass for further evaluation.Risks and benefits discussed with the patient including, but not limited to bleeding, infection, damage to adjacent structures or low yield requiring additional tests.All of the patient's questions were answered, patient is agreeable to proceed.Consent signed and in chart.      Thank you for this interesting consult.  I greatly enjoyed meeting Katherine Boone and look forward to participating in their care.  A copy of this report was  sent to the requesting provider on this date.  Electronically Signed: D. Rowe Robert 05/04/2015, 11:46 AM   I spent a total of   15 minutes in face to face in clinical consultation, greater than 50% of which was counseling/coordinating care for CT-guided pelvic versus mesenteric mass biopsy

## 2015-05-04 NOTE — Procedures (Signed)
CT biopsy L pelvic mass [id number] cores No complication No blood loss. See complete dictation in [facility]

## 2015-05-04 NOTE — Sedation Documentation (Signed)
Patient denies pain and is resting comfortably.  

## 2015-05-04 NOTE — Discharge Instructions (Signed)
Needle Biopsy, Care After Refer to this sheet in the next few weeks. These instructions provide you with information about caring for yourself after your procedure. Your health care provider may also give you more specific instructions. Your treatment has been planned according to current medical practices, but problems sometimes occur. Call your health care provider if you have any problems or questions after your procedure. WHAT TO EXPECT AFTER THE PROCEDURE After your procedure, it is common to have soreness, bruising, or mild pain at the biopsy site. This should go away in a few days. HOME CARE INSTRUCTIONS  Rest as directed by your health care provider.  Take medicines only as directed by your health care provider.  There are many different ways to close and cover the biopsy site, including stitches (sutures), skin glue, and adhesive strips. Follow your health care provider's instructions about:  Biopsy site care.  Bandage (dressing) changes and removal.  Biopsy site closure removal.  Check your biopsy site every day for signs of infection. Watch for:  Redness, swelling, or pain.  Fluid, blood, or pus. SEEK MEDICAL CARE IF:  You have a fever.  You have redness, swelling, or pain at the biopsy site that lasts longer than a few days.  You have fluid, blood, or pus coming from the biopsy site.  You feel nauseous.  You vomit. SEEK IMMEDIATE MEDICAL CARE IF:  You have shortness of breath.  You have trouble breathing.  You have chest pain.   You feel dizzy or you faint.  You have bleeding that does not stop with pressure or a bandage.  You cough up blood.  You have pain in your abdomen.   This information is not intended to replace advice given to you by your health care provider. Make sure you discuss any questions you have with your health care provider.   Document Released: 07/06/2014 Document Reviewed: 05/03/201 Elsevier Interactive Patient Education 2016  [employer].      Moderate Conscious Sedation, Adult, Care After Refer to this sheet in the next few weeks. These instructions provide you with information on caring for yourself after your procedure. Your health care provider may also give you more specific instructions. Your treatment has been planned according to current medical practices, but problems sometimes occur. Call your health care provider if you have any problems or questions after your procedure. WHAT TO EXPECT AFTER THE PROCEDURE  After your procedure:  You may feel sleepy, clumsy, and have poor balance for several hours.  Vomiting may occur if you eat too soon after the procedure. HOME CARE INSTRUCTIONS  Do not participate in any activities where you could become injured for at least 24 hours. Do not:  Drive.  Swim.  Ride a bicycle.  Operate heavy machinery.  Cook.  Use power tools.  Climb ladders.  Work from a high place.  Do not make important decisions or sign legal documents until you are improved.  If you vomit, drink water, juice, or soup when you can drink without vomiting. Make sure you have little or no nausea before eating solid foods.  Only take over-the-counter or prescription medicines for pain, discomfort, or fever as directed by your health care provider.  Make sure you and your family fully understand everything about the medicines given to you, including what side effects may occur.  You should not drink alcohol, take sleeping pills, or take medicines that cause drowsiness for at least 24 hours.  If you smoke, do not smoke without  supervision.  If you are feeling better, you may resume normal activities 24 hours after you were sedated.  Keep all appointments with your health care provider. SEEK MEDICAL CARE IF:  Your skin is pale or bluish in color.  You continue to feel nauseous or vomit.  Your pain is getting worse and is not helped by medicine.  You have bleeding or  swelling.  You are still sleepy or feeling clumsy after 24 hours. SEEK IMMEDIATE MEDICAL CARE IF:  You develop a rash.  You have difficulty breathing.  You develop any type of allergic problem.  You have a fever. MAKE SURE YOU:  Understand these instructions.  Will watch your condition.  Will get help right away if you are not doing well or get worse.   This information is not intended to replace advice given to you by your health care provider. Make sure you discuss any questions you have with your health care provider.   Document Released: 12/10/2012 Document Revised: 03/12/2014 Document Reviewed: 12/10/2012 Elsevier Interactive Patient Education [employer].

## 2015-05-04 NOTE — Discharge Instructions (Signed)
°Moderate Conscious Sedation, Adult, Care After °Refer to this sheet in the next few weeks. These instructions provide you with information on caring for yourself after your procedure. Your health care provider may also give you more specific instructions. Your treatment has been planned according to current medical practices, but problems sometimes occur. Call your health care provider if you have any problems or questions after your procedure. °WHAT TO EXPECT AFTER THE PROCEDURE  °After your procedure: °· You may feel sleepy, clumsy, and have poor balance for several hours. °· Vomiting may occur if you eat too soon after the procedure. °HOME CARE INSTRUCTIONS °· Do not participate in any activities where you could become injured for at least 24 hours. Do not: °¨ Drive. °¨ Swim. °¨ Ride a bicycle. °¨ Operate heavy machinery. °¨ Cook. °¨ Use power tools. °¨ Climb ladders. °¨ Work from a high place. °· Do not make important decisions or sign legal documents until you are improved. °· If you vomit, drink water, juice, or soup when you can drink without vomiting. Make sure you have little or no nausea before eating solid foods. °· Only take over-the-counter or prescription medicines for pain, discomfort, or fever as directed by your health care provider. °· Make sure you and your family fully understand everything about the medicines given to you, including what side effects may occur. °· You should not drink alcohol, take sleeping pills, or take medicines that cause drowsiness for at least 24 hours. °· If you smoke, do not smoke without supervision. °· If you are feeling better, you may resume normal activities 24 hours after you were sedated. °· Keep all appointments with your health care provider. °SEEK MEDICAL CARE IF: °· Your skin is pale or bluish in color. °· You continue to feel nauseous or vomit. °· Your pain is getting worse and is not helped by medicine. °· You have bleeding or swelling. °· You are still  sleepy or feeling clumsy after 24 hours. °SEEK IMMEDIATE MEDICAL CARE IF: °· You develop a rash. °· You have difficulty breathing. °· You develop any type of allergic problem. °· You have a fever. °MAKE SURE YOU: °· Understand these instructions. °· Will watch your condition. °· Will get help right away if you are not doing well or get worse. °  °This information is not intended to replace advice given to you by your health care provider. Make sure you discuss any questions you have with your health care provider. °  °Document Released: 12/10/2012 Document Revised: 03/12/2014 Document Reviewed: 12/10/2012 °Elsevier Interactive Patient Education ©2016 Elsevier Inc[employer] °Needle Biopsy, Care After °These instructions give you information about caring for yourself after your procedure. Your doctor may also give you more specific instructions. Call your doctor if you have any problems or questions after your procedure. °HOME CARE °· Rest as told by your doctor. °· Take medicines only as told by your doctor. °· There are many different ways to close and cover the biopsy site, including stitches (sutures), skin glue, and adhesive strips. Follow instructions from your doctor about: °¨ How to take care of your biopsy site. °¨ When and how you should change your bandage (dressing). °¨ When you should remove your dressing. °¨ Removing whatever was used to close your biopsy site. °· Check your biopsy site every day for signs of infection. Watch for: °¨ Redness, swelling, or pain. °¨ Fluid, blood, or pus. °GET HELP IF: °· You have a fever. °· You have redness, swelling, or   pain at the biopsy site, and it lasts longer than a few days. °· You have fluid, blood, or pus coming from the biopsy site. °· You feel sick to your stomach (nauseous). °· You throw up (vomit). °GET HELP RIGHT AWAY IF: °· You are short of breath. °· You have trouble breathing. °· Your chest hurts. °· You feel dizzy or you pass out (faint). °· You have bleeding  that does not stop with pressure or a bandage. °· You cough up blood. °· Your belly (abdomen) hurts. °  °This information is not intended to replace advice given to you by your health care provider. Make sure you discuss any questions you have with your health care provider. °  °Document Released: 02/02/2008 Document Revised: 07/06/2014 Document Reviewed: 02/15/2014 °Elsevier Interactive Patient Education ©2016 Elsevier Inc. ° °

## 2015-05-05 ENCOUNTER — Other Ambulatory Visit: Payer: Self-pay | Admitting: Oncology

## 2015-05-05 DIAGNOSIS — C799 Secondary malignant neoplasm of unspecified site: Secondary | ICD-10-CM

## 2015-05-06 ENCOUNTER — Encounter: Payer: Self-pay | Admitting: Oncology

## 2015-05-06 ENCOUNTER — Other Ambulatory Visit (HOSPITAL_BASED_OUTPATIENT_CLINIC_OR_DEPARTMENT_OTHER): Payer: Commercial Managed Care - HMO

## 2015-05-06 ENCOUNTER — Telehealth: Payer: Self-pay | Admitting: Oncology

## 2015-05-06 ENCOUNTER — Other Ambulatory Visit: Payer: Self-pay | Admitting: Oncology

## 2015-05-06 ENCOUNTER — Ambulatory Visit (HOSPITAL_BASED_OUTPATIENT_CLINIC_OR_DEPARTMENT_OTHER): Payer: Commercial Managed Care - HMO | Admitting: Oncology

## 2015-05-06 VITALS — BP 112/65 | HR 73 | Temp 97.7°F | Resp 18 | Ht 65.75 in | Wt 166.5 lb

## 2015-05-06 DIAGNOSIS — C7801 Secondary malignant neoplasm of right lung: Secondary | ICD-10-CM | POA: Insufficient documentation

## 2015-05-06 DIAGNOSIS — C799 Secondary malignant neoplasm of unspecified site: Secondary | ICD-10-CM

## 2015-05-06 DIAGNOSIS — M810 Age-related osteoporosis without current pathological fracture: Secondary | ICD-10-CM | POA: Diagnosis not present

## 2015-05-06 DIAGNOSIS — C539 Malignant neoplasm of cervix uteri, unspecified: Secondary | ICD-10-CM | POA: Insufficient documentation

## 2015-05-06 DIAGNOSIS — C801 Malignant (primary) neoplasm, unspecified: Secondary | ICD-10-CM

## 2015-05-06 DIAGNOSIS — Z23 Encounter for immunization: Secondary | ICD-10-CM

## 2015-05-06 DIAGNOSIS — K219 Gastro-esophageal reflux disease without esophagitis: Secondary | ICD-10-CM | POA: Diagnosis not present

## 2015-05-06 DIAGNOSIS — C7802 Secondary malignant neoplasm of left lung: Secondary | ICD-10-CM | POA: Insufficient documentation

## 2015-05-06 DIAGNOSIS — R1900 Intra-abdominal and pelvic swelling, mass and lump, unspecified site: Secondary | ICD-10-CM

## 2015-05-06 LAB — COMPREHENSIVE METABOLIC PANEL
ALBUMIN: 3.7 g/dL (ref 3.5–5.0)
ALK PHOS: 121 U/L (ref 40–150)
ALT: 12 U/L (ref 0–55)
AST: 19 U/L (ref 5–34)
Anion Gap: 8 mEq/L (ref 3–11)
BUN: 8.2 mg/dL (ref 7.0–26.0)
CALCIUM: 9.3 mg/dL (ref 8.4–10.4)
CHLORIDE: 108 meq/L (ref 98–109)
CO2: 26 mEq/L (ref 22–29)
Creatinine: 0.8 mg/dL (ref 0.6–1.1)
EGFR: 79 mL/min/{1.73_m2} — ABNORMAL LOW (ref >90)
Glucose: 92 mg/dl (ref 70–140)
Potassium: 4.1 mEq/L (ref 3.5–5.1)
Sodium: 143 mEq/L (ref 136–145)
Total Bilirubin: 0.46 mg/dL (ref 0.20–1.20)
Total Protein: 7.3 g/dL (ref 6.4–8.3)

## 2015-05-06 LAB — CBC WITH DIFFERENTIAL/PLATELET
BASO%: 0.3 % (ref 0.0–2.0)
Basophils Absolute: 0 10*3/uL (ref 0.0–0.1)
EOS ABS: 0.1 10*3/uL (ref 0.0–0.5)
EOS%: 1.7 % (ref 0.0–7.0)
HEMATOCRIT: 41.7 % (ref 34.8–46.6)
HEMOGLOBIN: 13.9 g/dL (ref 11.6–15.9)
LYMPH#: 1.2 10*3/uL (ref 0.9–3.3)
LYMPH%: 18.2 % (ref 14.0–49.7)
MCH: 28.5 pg (ref 25.1–34.0)
MCHC: 33.3 g/dL (ref 31.5–36.0)
MCV: 85.6 fL (ref 79.5–101.0)
MONO#: 0.6 10*3/uL (ref 0.1–0.9)
MONO%: 8.9 % (ref 0.0–14.0)
NEUT%: 70.9 % (ref 38.4–76.8)
NEUTROS ABS: 4.6 10*3/uL (ref 1.5–6.5)
NRBC: 0 % (ref 0–0)
PLATELETS: 230 10*3/uL (ref 145–400)
RBC: 4.87 10*6/uL (ref 3.70–5.45)
RDW: 13.2 % (ref 11.2–14.5)
WBC: 6.5 10*3/uL (ref 3.9–10.3)

## 2015-05-06 MED ORDER — INFLUENZA VAC SPLIT QUAD 0.5 ML IM SUSY
0.5000 mL | PREFILLED_SYRINGE | Freq: Once | INTRAMUSCULAR | Status: AC
  Administered 2015-05-06: 0.5 mL via INTRAMUSCULAR
  Filled 2015-05-06: qty 0.5

## 2015-05-06 NOTE — Progress Notes (Signed)
[place] NEW PATIENT EVALUATION   Name: Katherine Boone Date: May 06, 2015  MRN: [id number] DOB: [birth year]  REFERRING PHYSICIAN: D.ClarkePearson cc [name], MD,    REASON FOR REFERRAL: metastatic gyn malignancy, for consideration of chemotherapy   HISTORY OF PRESENT ILLNESS:Katherine Boone is a 70 y.o. female who is seen in consultation, at the request of Dr Josephina Shih, for recently diagnosed metastatic carcinoma which appears to be of gyn primary. She is alone for visit today.  Patient was in her usual very good health until new cramping abdominal pain intermittently for last few months. She had some constipation with this, particularly severe when she saw PCP for this reason on 04-26-15. She has had no vaginal bleeding. She had no bowel obstruction by xray and bowels moved with miralax. CT AP 04-26-15 had 10.3 x 13.8 mixed cystic and solid mass involving uterus and extending posteriorly into cul de sac and into bilateral adnexal regions, sigmoid colon tethered to mass,  no ascites, scattered necrotic lymph nodes in mesentery and omentum, no hydroureter, 3 lesions in liver up to 3.4 cm, and numerous bilateral pulmonary nodules in lung bases. CEA was 1.1 and CA 125 was 105; CBC and  CMET 04-26-15 were entirely normal. 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 She had CT biopsy of pelvic mass in region of left psoas on 3-1-1 7. Pathology [phone], available just at time of visit today, shows high grade poorly differentiated carcinoma with immunohistochemical stains suggesting cervical primary. I have discussed pathology information directly with Dr Josephina Shih during visit today.   REVIEW OF SYSTEMS  Usual weight 170-180. Has vomited only once, when severe abdominal cramping prior to starting miralax. Most of the abdominal discomfort has been LLQ. No bleeding. Recently had 4-6 weeks of upper respiratory congestion  with slight clear productive cough, resolved entirely. No chest pain. Appetite better since bowels moving, tolerating regular diet. Bowels moved 3x yesterday, soft stool not large amounts.Some GERD, improved with prn OTC prilosec. Bladder ok.  No HA. Good visual acuity with reading glasses. No difficulty hearing. Partial dentures, does have dentist. no thyroid problems. Had pains in breasts evaluated with mammograms last year, resolved with decrease in caffeine. No LE swelling. No arthritis symptoms. No peripheral neuropathy.  Remainder of full 10 point review of systems negative.   ALLERGIES: Review of patient's allergies indicates no known allergies. Nausea with percocet  PAST MEDICAL/ SURGICAL HISTORY:    G2 Fractured right arm May 03, 2008 when fell on stairs Osteoporosis by bone density May 03, 2010 (T scores -2.6, -3.8) Appendectomy [facility] 05[facility]14 negative Never colonoscopy (patient refused). FOB negative May 03, 2010, May 03, 2014 Flu vaccine given today (05-06-15)  CURRENT MEDICATIONS: reviewed as listed now in EMR. Premedication decadron will be 20 mg with food 12 hrs and 6 hrs prior to taxol. Antiemetics will be zofran and ativan. She will use claritin 10 mg daily x 7 beginning day of chemo for taxol aches. She may need to increase miralax around chemo  PHARMACY [facility] [place]   SOCIAL HISTORY:  From [place]. Separated, lives alone with 4 small dogs, cats, birds. Both children deceased, including son with cerebral palsy who died 05/04/2007 of aspiration pneumonia. Never smoker. 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 Enjoys going to [place]. She has friend who will drive her to and from chemo. She has nephews  locally.  FAMILY HISTORY:   Father "stomach ca" in 45s Mother colon ca in 96s 5 brothers, one with "lung and stomach" smoker, 2 with "stomach", one with throat ca 3 sisters, one with heart problems no  ca Niece ovarian age 85 Niece melanoma age 76 One son cerebral palsy, deceased         PHYSICAL EXAM:  height is 5' 5.75" (1.67 m) and weight is 166 lb 8 oz (75.524 kg). Her oral temperature is 97.7 F (36.5 C). Her blood pressure is 112/65 and her pulse is 73. Her respiration is 18 and oxygen saturation is 100%.  Alert, pleasant, cooperative lady, looks comfortable and not obviously ill. Respirations not labored RA. Easily mobile.   HEENT: normal hair pattern. PERRL, not icteric. Oral mucosa moist and clear, partial dentures. Neck supple without JVD or thyroid mass.   RESPIRATORY: lungs somewhat diminished BS thruout,  No wheezes or rales, no use of accessory muscles  CARDIAC/ VASCULAR: heart RRR no gallop, clear heart sounds. Peripheral pulses symmetrical and intact. Peripheral veins look adequate for chemotherapy  ABDOMEN: soft, usual size per patient, not tender including LLQ.Marland Kitchen Some normal bowel sounds. No appreciable HSM or mass.  LYMPH NODES: small medial left supraclavicular nodes not tender. No cervical nodes. No axillary or inguinal nodes, no right supraclavicular.  BREASTS:bilaterally without dominant mass, skin or nipple findings. NEUROLOGIC:speech fluent. CN, motor, sensory, cerebellar intact. PSYCH tearful at times during discussion, but entirely appropriate and follows discussion well.  SKIN: without ecchymoses, rash, petechiae.  MUSCULOSKELETAL: Back not tender. LE without edema, cords, tenderness. Good and symmetrical muscle mass.    LABORATORY DATA:  Results for orders placed or performed in visit on 05/06/15 (from the past 48 hour(s))  CBC with Differential     Status: None   Collection Time: 05/06/15  8:35 AM  Result Value Ref Range   WBC 6.5 3.9 - 10.3 10e3/uL   NEUT# 4.6 1.5 - 6.5 10e3/uL   HGB 13.9 11.6 - 15.9 g/dL   HCT 41.7 34.8 - 46.6 %   Platelets 230 145 - 400 10e3/uL   MCV 85.6 79.5 - 101.0 fL   MCH 28.5 25.1 - 34.0 pg   MCHC 33.3 31.5 - 36.0  g/dL   RBC 4.87 3.70 - 5.45 10e6/uL   RDW 13.2 11.2 - 14.5 %   lymph# 1.2 0.9 - 3.3 10e3/uL   MONO# 0.6 0.1 - 0.9 10e3/uL   Eosinophils Absolute 0.1 0.0 - 0.5 10e3/uL   Basophils Absolute 0.0 0.0 - 0.1 10e3/uL   NEUT% 70.9 38.4 - 76.8 %   LYMPH% 18.2 14.0 - 49.7 %   MONO% 8.9 0.0 - 14.0 %   EOS% 1.7 0.0 - 7.0 %   BASO% 0.3 0.0 - 2.0 %   nRBC 0 0 - 0 %  Comprehensive metabolic panel     Status: Abnormal   Collection Time: 05/06/15  8:35 AM  Result Value Ref Range   Sodium 143 136 - 145 mEq/L   Potassium 4.1 3.5 - 5.1 mEq/L   Chloride 108 98 - 109 mEq/L   CO2 26 22 - 29 mEq/L   Glucose 92 70 - 140 mg/dl    Comment: Glucose reference range is for nonfasting patients. Fasting glucose reference range is 70- 100.   BUN 8.2 7.0 - 26.0 mg/dL   Creatinine 0.8 0.6 - 1.1 mg/dL   Total Bilirubin 0.46 0.20 - 1.20 mg/dL   Alkaline Phosphatase 121 40 - 150 U/L  AST 19 5 - 34 U/L   ALT 12 0 - 55 U/L   Total Protein 7.3 6.4 - 8.3 g/dL   Albumin 3.7 3.5 - 5.0 g/dL   Calcium 9.3 8.4 - 10.4 mg/dL   Anion Gap 8 3 - 11 mEq/L   EGFR 79 (L) >90 ml/min/1.73 m2    Comment: eGFR is calculated using the CKD-EPI Creatinine Equation (2009)      PATHOLOGY: Lipsitz, Yides L Collected: 05/04/2015 Client: [facility] Accession: [id number] Received: 05/04/2015 D. Daniel HassellTHOLOGYFINAL DIAGNOSIS Diagnosis Soft Tissue Needle Core Biopsy, left ext iliac mass METASTATIC HIGH GRADE POORLY DIFFERENTIATED CARCINOMA CONSISTENT WITH CERVICAL ORIGIN Microscopic Comment The neoplasm stains positive for cervical markers : p16, ER, ck8/18, and p63 (focal), TTF-1 (focal) and negative for synaptophysin, Chromogranin (neuroendocrine markers) and ck5/6. The morphology and immunohistochemistry staining pattern support these cells are GYN cervical origin.  RADIOGRAPHY: CT ABDOMEN AND PELVIS WITH CONTRAST  04-26-15  COMPARISON: None.  FINDINGS: Lower chest: Numerous bilateral pulmonary nodules are  evident measuring in the 5-10 mm size range. 10 mm index nodule is seen in the posterior right lung base (image 11 series for. Index nodule in the left posterior lung base measures 11 mm on image 7 series 4.  Hepatobiliary: 3.4 cm ill-defined lesion is identified in the medial segment left liver with attenuation higher than would be expected for a simple cyst. 11 mm ill-defined low-density lesion is seen in the inferior right liver on image 25 series 2. 6 mm low-density lesion seen in the medial right liver on image 23. There is no evidence for gallstones, gallbladder wall thickening, or pericholecystic fluid. No intrahepatic or extrahepatic biliary dilation.  Pancreas: No focal mass lesion. No dilatation of the main duct. No intraparenchymal cyst. No peripancreatic edema.  Spleen: No splenomegaly. No focal mass lesion.  Adrenals/Urinary Tract: No adrenal nodule or mass. 5 mm low-density lesion in the upper pole right kidney is too small to characterize. 14 mm well-defined water density lesion in the interpolar left kidney is likely a cyst. No evidence for hydroureter. The urinary bladder appears normal for the degree of distention.  Stomach/Bowel: Stomach is nondistended. No gastric wall thickening. No evidence of outlet obstruction. Duodenum is normally positioned as is the ligament of Treitz. No small bowel wall thickening. No small bowel dilatation. The terminal ileum is normal. The appendix is not visualized, but there is no edema or inflammation in the region of the cecum. Sigmoid colon is tethered to a large a amorphous cystic and solid mass in the central anatomic pelvis.  Vascular/Lymphatic: No abdominal aortic aneurysm. No abdominal atherosclerotic calcification. 1.9 x 3.0 cm necrotic right para-aortic lymph node is seen on image 30 series 2. Other necrotic retroperitoneal lymphadenopathy is seen towards the aortic bifurcation. Small lymph nodes are seen scattered  in the pelvic sidewall bilaterally.  Reproductive: 10.3 x 13.8 cm mixed cystic and solid mass lesion involves the uterus and extends posteriorly into the cul-de-sac and laterally into both adnexal regions.  Other: Scattered necrotic nodules in lymph nodes are seen in the mesentery and omentum. 2.9 cm necrotic nodule is seen in the proximal sigmoid mesocolon (image 63 series 2). No substantial intraperitoneal free fluid  Musculoskeletal: Bone windows reveal no worrisome lytic or sclerotic osseous lesions. Superior endplate compression deformity is seen at T11 but there is no evidence of this represents a pathologic fracture.  IMPRESSION: 1. Numerous bilateral pulmonary nodules consistent with metastatic disease are associated with liver lesions, omental nodules, mesenteric  nodules, necrotic retroperitoneal lymphadenopathy in a mixed cystic and solid mass in the central pelvis involving the uterus extending into the cul-de-sac and both adnexal regions. Uterine primary (likely endometrial) is favored over an ovarian primary malignancy given the lack of ascites, associated intrahepatic metastases and pulmonary involvement.   PACs images reviewed by MD; patient preferred not to see images.  CT chest ordered now due to bilateral pulmonary nodules seen in lung bases.  DISCUSSION: All of history above reviewed with patient. She is having a difficult time comprehending that she has an advanced cancer, as she is not very symptomatic; she understands that treatment with chemotherapy will be in attempt to shrink and control disease. She tells me that she would like to live longer and is very willing to try chemotherapy. We have discussed the pathology information just available today, which she understands is not exactly what was expected, and may not reflect all of the malignant process.  After discussing with Dr Josephina Shih, recommendation is to start treatment with carboplatin taxol,  with restaging after 3 cycles. If not adequate response, could consider CDDP/ taxol/ avastin.  As patient will attend chemotherapy education class next week, we have discussed mechanism of action of chemotherapy in general, logistics of outpatient administration and follow up, possible side effects including nausea, cytopenias, hair loss, allergic reactions, hair loss, taxol aches, peripheral neuropathy, antiemetics, premedication steroids, growth factors. She is in agreement with every 3 week dosing.   Patient has given verbal consent for chemotherapy. She knows that I am glad to talk with her further if she has questions or concerns following the full chemotherapy education class.   We have discussed genetic counseling, particularly given family history, and she is in agreement with pursuing this.   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 She is self employed, plans not to work at least thru first cycle of chemo.  We have discussed flu widespread in community. She has taken flu shots most years past, but did not have this done this year. Tho vaccine will probably take ~ 2 weeks to be effective, this still seems safest given planned chemotherapy and ongoing flu season. She knows that she should have tamiflu if symptoms of influenza or direct exposure to influenza.    IMPRESSION / PLAN:  1.Gyn malignancy with large pelvic mass involving uterus and bilateral adnexal regions, adenopathy, possible liver involvement and apparent pulmonary mets. Pathology of peritoneal mass is high grade poorly differentiated carcinoma with immunohistochemical pattern suggesting cervical primary. CT chest pending to complete staging. She has had symptoms from bowel dysfunction, with sigmoid colon at the pelvic mass, improved with miralax which she will continue. She is likely at risk for bowel obstruction in that area, but no surgical intervention recommended in  absence of more significant symptoms now. Plan to start chemotherapy with every 3 week carboplatin taxol beginning ~ 05-12-15. 2. Strong family history of colon, "stomach", ovarian cancers; also lung, throat and melanoma. Referral for genetics counseling. She has not had colonoscopy, normal CEA , no anemia.  3.flu vaccine 05-06-15 4. Osteoporosis by DEXA 2012, on calcium and D 5.up to date mammograms 6.GERD improved with prilosec 7 no advance directives, declined information today   Patient had questions answered to her satisfaction and is in agreement with plan above. She can contact this office for questions or concerns at any time prior to next scheduled visit. Chemo orders entered, also granix if needed. Managed  care notified for preauthorization  Time spent  60 min, including >50% discussion and coordination of care. Cc Dr Clydie Braun, MD 05/06/2015 6:33 PM

## 2015-05-06 NOTE — Patient Instructions (Signed)
We will send prescriptions to your pharmacy   1.decadron (dexamethasone, steroid) 4 mg. Take five tablets +(=20 mg) with food 12 hrs before taxol chemotherapy and five tablets with food 6 hrs before taxol   2.zofran (ondansetron) 8mg One tablet every 8 hrs as needed for nausea. Will not make you drowsy. Fine to take one tablet AM after chemo whether or not any nausea then, to extend coverage for nausea a bit longer. Other than that dose, fine to take just as needed for nausea   3.ativan (lorazepam) 0.5 mg. One tablet swallow or dissolve under tongue every 6 hrs as needed for nausea. WIll make you drowsy and a little forgetful around each dose. Fine to take one tablet at bedtime night of chemo whether or not any nausea.   You can call any time if needed [phone]  

## 2015-05-06 NOTE — Telephone Encounter (Signed)
appts made and avs printed. Sent message to [facility] for order of CT chest to be scheduled.

## 2015-05-09 ENCOUNTER — Telehealth: Payer: Self-pay

## 2015-05-09 DIAGNOSIS — C539 Malignant neoplasm of cervix uteri, unspecified: Secondary | ICD-10-CM

## 2015-05-09 MED ORDER — DEXAMETHASONE 4 MG PO TABS: ORAL_TABLET | ORAL | Status: DC

## 2015-05-09 MED ORDER — LORAZEPAM 0.5 MG PO TABS: 0.5000 mg | ORAL_TABLET | Freq: Four times a day (QID) | ORAL | Status: DC | PRN

## 2015-05-09 MED ORDER — ONDANSETRON HCL 8 MG PO TABS: 8.0000 mg | ORAL_TABLET | Freq: Three times a day (TID) | ORAL | Status: DC | PRN

## 2015-05-09 NOTE — Telephone Encounter (Signed)
-----   Message from Gordy Levan, MD sent at 05/06/2015 11:58 PM EST ----- Scripts to pharmacy please. RN please go over meds with her before first treatment ~ 3-9 [employer] always fine  Decadron 4 mg  Five tablets with food 12 hrs and 6 hrs before taxol  #10  zofran 8 mg  1 every 8 hrs prn nausea . WIll not make drowsy. Suggest taking AM after chemo whether or not nausea. #30  Ativan 0.5 mg  SL or po every 6 hrs prn nausea. Will make drowsy. Suggest taking at hs night of chemo whether or not nausea.  #20  thanks Also needs claritin 10 mg daily x 7 begin day of chemo

## 2015-05-09 NOTE — Telephone Encounter (Signed)
lvm that 3 meds were escribed for chemo side effect management. Also that pt should take OTC claritin for 7 days beginning day of chemo for taxol aches. Asked pt to call back to go over these medications.   S/w pt about her medication regimen. She spoke back instructions.

## 2015-05-10 ENCOUNTER — Encounter: Payer: Self-pay | Admitting: *Deleted

## 2015-05-10 ENCOUNTER — Encounter: Payer: Self-pay | Admitting: Oncology

## 2015-05-10 ENCOUNTER — Other Ambulatory Visit: Payer: Self-pay | Admitting: *Deleted

## 2015-05-10 ENCOUNTER — Ambulatory Visit (HOSPITAL_COMMUNITY)
Admission: RE | Admit: 2015-05-10 | Discharge: 2015-05-10 | Disposition: A | Discharge status: Home / Self Care | Payer: Commercial Managed Care - HMO | Source: Ambulatory Visit | Attending: Oncology | Admitting: Oncology

## 2015-05-10 ENCOUNTER — Ambulatory Visit (HOSPITAL_BASED_OUTPATIENT_CLINIC_OR_DEPARTMENT_OTHER): Payer: Commercial Managed Care - HMO | Admitting: Nurse Practitioner

## 2015-05-10 ENCOUNTER — Other Ambulatory Visit: Payer: Commercial Managed Care - HMO

## 2015-05-10 ENCOUNTER — Ambulatory Visit: Payer: Commercial Managed Care - HMO

## 2015-05-10 ENCOUNTER — Encounter (HOSPITAL_COMMUNITY): Payer: Self-pay

## 2015-05-10 ENCOUNTER — Encounter: Payer: Self-pay | Admitting: Nurse Practitioner

## 2015-05-10 VITALS — BP 133/68 | HR 65 | Temp 97.7°F | Resp 18

## 2015-05-10 DIAGNOSIS — C787 Secondary malignant neoplasm of liver and intrahepatic bile duct: Secondary | ICD-10-CM | POA: Insufficient documentation

## 2015-05-10 DIAGNOSIS — M438X4 Other specified deforming dorsopathies, thoracic region: Secondary | ICD-10-CM | POA: Insufficient documentation

## 2015-05-10 DIAGNOSIS — E042 Nontoxic multinodular goiter: Secondary | ICD-10-CM | POA: Insufficient documentation

## 2015-05-10 DIAGNOSIS — R21 Rash and other nonspecific skin eruption: Secondary | ICD-10-CM | POA: Insufficient documentation

## 2015-05-10 DIAGNOSIS — C799 Secondary malignant neoplasm of unspecified site: Secondary | ICD-10-CM

## 2015-05-10 DIAGNOSIS — C7800 Secondary malignant neoplasm of unspecified lung: Secondary | ICD-10-CM | POA: Diagnosis not present

## 2015-05-10 DIAGNOSIS — M4854XA Collapsed vertebra, not elsewhere classified, thoracic region, initial encounter for fracture: Secondary | ICD-10-CM | POA: Diagnosis not present

## 2015-05-10 DIAGNOSIS — C801 Malignant (primary) neoplasm, unspecified: Secondary | ICD-10-CM | POA: Insufficient documentation

## 2015-05-10 DIAGNOSIS — C7801 Secondary malignant neoplasm of right lung: Secondary | ICD-10-CM | POA: Diagnosis not present

## 2015-05-10 DIAGNOSIS — C7802 Secondary malignant neoplasm of left lung: Secondary | ICD-10-CM | POA: Diagnosis not present

## 2015-05-10 MED ORDER — IOHEXOL 300 MG/ML  SOLN
75.0000 mL | Freq: Once | INTRAMUSCULAR | Status: AC | PRN
  Administered 2015-05-10: 75 mL via INTRAVENOUS

## 2015-05-10 MED ORDER — VALACYCLOVIR HCL 1 G PO TABS: 1000.0000 mg | ORAL_TABLET | Freq: Three times a day (TID) | ORAL | Status: DC

## 2015-05-10 NOTE — Progress Notes (Signed)
SYMPTOM MANAGEMENT CLINIC   HPI: Katherine Boone 70 y.o. female diagnosed with probable ovarian cancer with both lung and liver metastasis.  The plan is for the patient to initiate carboplatin/Taxol chemotherapy this week.  She will receive the chemotherapy on an every three-week basis.  Patient has been diagnosed with a pelvic mass; which is most likely ovarian in nature.  She's also been diagnosed with both lung metastasis and liver metastasis.  She presented to the [facility] today to receive chemotherapy education for her planned carboplatin/Taxol chemotherapy to begin later this week.  Patient states that she intermittently develops a chronic rash to the upper sacral area that is very pruritic.  She denies any pain to the area.  She states that the rash comes and goes.  Patient also denies any recent fevers or chills.  Exam today reveals 2 blisterlike lesions to the top of her sacral region.  There is no surrounding erythema, edema, tenderness, warmth, or red streaks.  Was able to obtained a wound culture of the lesion.  Quite possibly-these lesions are herpetic in nature.  Will prescribe valacyclovir 1 g 3 times per day for treatment of herpetic lesions.  Also, patient will return this coming Thursday, 05/12/2015 to receive her first cycle of chemotherapy.  Have advised the patient will do a brief recheck of these lesions prior to her initiating the chemotherapy at that time.  Patient was advised to call/return gradually to the emergency department for any worsening symptoms whatsoever.   HPI  Review of Systems  Skin: Positive for itching and rash.  All other systems reviewed and are negative.   Past Medical History  Diagnosis Date  . GERD (gastroesophageal reflux disease)     diet controlled  . Migraines     none recently  . Family history of cancer     stomach, colon, lung, throat  . Gout 03/2005    ?  Marland Kitchen Osteoporosis 02/2011    DEXA 12/12 - Lspine -2.6; femur  -3.8    Past Surgical History  Procedure Laterality Date  . Appendectomy  1960    has GERD (gastroesophageal reflux disease); Family history of cancer; Urinary urgency; Medicare annual wellness visit, initial; Insomnia; Osteoporosis; Dizziness; HLD (hyperlipidemia); Health maintenance examination; Advanced care planning/counseling discussion; Renal insufficiency; Constipation; Lower abdominal pain; Metastatic cancer ([place]); Pelvic mass in female; Malignant neoplasm metastatic to right lung ([place]); Malignant neoplasm metastatic to left lung ([place]); [employer] Federation of Gyne[employer] Obstetric[employer] (FIGO) stage IVB malignant neoplasm of cervix ([place]); and Rash on her problem list.    has No Known Allergies.    Medication List       This list is accurate as of: 05/10/15 11:59 AM.  Always use your most recent med list.               alendronate 70 MG tablet  Commonly known as:  FOSAMAX  Take 1 tablet (70 mg total) by mouth every 7 (seven) days. Take with a full glass of water on an empty stomach.     Calcium Carbonate-Vit D-Min 1200-1000 MG-UNIT Chew  Chew 1 tablet by mouth daily.     cyanocobalamin 100 MCG tablet  Take 100 mcg by mouth daily. Reported on 04/29/2015     dexamethasone 4 MG tablet  Commonly known as:  DECADRON  Take 5 tablets (62m) with food 12 hours and 6 hours before taxol     LORazepam 0.5 MG tablet  Commonly known as:  ATIVAN  Take  1 tablet (0.5 mg total) by mouth every 6 (six) hours as needed (nausea). Or under the tongue. Will make drowsy.     omeprazole 20 MG capsule  Commonly known as:  PRILOSEC  Take 1 capsule (20 mg total) by mouth daily.     ondansetron 8 MG tablet  Commonly known as:  ZOFRAN  Take 1 tablet (8 mg total) by mouth every 8 (eight) hours as needed for nausea or vomiting. Will not make drowsy     polyethylene glycol packet  Commonly known as:  MIRALAX / GLYCOLAX  Take 17 g by mouth 2 (two) times daily.     valACYclovir 1000 MG tablet   Commonly known as:  VALTREX  Take 1 tablet (1,000 mg total) by mouth 3 (three) times daily.     Vitamin D3 1000 units Caps  Take 1 capsule (1,000 Units total) by mouth daily.         PHYSICAL EXAMINATION  Oncology Vitals 05/10/2015 05/06/2015  Height - 167 cm  Weight - 75.524 kg  Weight (lbs) - 166 lbs 8 oz  BMI (kg/m2) - 27.08 kg/m2  Temp 97.7 97.7  Pulse 65 73  Resp 18 18  SpO2 99 100  BSA (m2) - 1.87 m2   BP Readings from Last 2 Encounters:  05/10/15 133/68  05/06/15 112/65    Physical Exam  Constitutional: She is well-developed, well-nourished, and in no distress.  HENT:  Head: Normocephalic and atraumatic.  Eyes: Conjunctivae and EOM are normal. Pupils are equal, round, and reactive to light. Right eye exhibits no discharge. Left eye exhibits no discharge. No scleral icterus.  Neck: Normal range of motion.  Pulmonary/Chest: Effort normal. No respiratory distress.  Skin: Skin is warm and dry. Rash noted. No erythema. No pallor.  Exam today reveals 2 blisterlike lesions to the top of her sacral region.  There is no surrounding erythema, edema, tenderness, warmth, or red streaks.      Psychiatric: Affect normal.    LABORATORY DATA:. No visits with results within 3 Day(s) from this visit. Latest known visit with results is:  Appointment on 05/06/2015  Component Date Value Ref Range Status  . WBC 05/06/2015 6.5  3.9 - 10.3 10e3/uL Final  . NEUT# 05/06/2015 4.6  1.5 - 6.5 10e3/uL Final  . HGB 05/06/2015 13.9  11.6 - 15.9 g/dL Final  . HCT 05/06/2015 41.7  34.8 - 46.6 % Final  . Platelets 05/06/2015 230  145 - 400 10e3/uL Final  . MCV 05/06/2015 85.6  79.5 - 101.0 fL Final  . MCH 05/06/2015 28.5  25.1 - 34.0 pg Final  . MCHC 05/06/2015 33.3  31.5 - 36.0 g/dL Final  . RBC 05/06/2015 4.87  3.70 - 5.45 10e6/uL Final  . RDW 05/06/2015 13.2  11.2 - 14.5 % Final  . lymph# 05/06/2015 1.2  0.9 - 3.3 10e3/uL Final  . MONO# 05/06/2015 0.6  0.1 - 0.9 10e3/uL Final  .  Eosinophils Absolute 05/06/2015 0.1  0.0 - 0.5 10e3/uL Final  . Basophils Absolute 05/06/2015 0.0  0.0 - 0.1 10e3/uL Final  . NEUT% 05/06/2015 70.9  38.4 - 76.8 % Final  . LYMPH% 05/06/2015 18.2  14.0 - 49.7 % Final  . MONO% 05/06/2015 8.9  0.0 - 14.0 % Final  . EOS% 05/06/2015 1.7  0.0 - 7.0 % Final  . BASO% 05/06/2015 0.3  0.0 - 2.0 % Final  . nRBC 05/06/2015 0  0 - 0 % Final  . Sodium 05/06/2015 143  136 - 145 mEq/L Final  . Potassium 05/06/2015 4.1  3.5 - 5.1 mEq/L Final  . Chloride 05/06/2015 108  98 - 109 mEq/L Final  . CO2 05/06/2015 26  22 - 29 mEq/L Final  . Glucose 05/06/2015 92  70 - 140 mg/dl Final   Glucose reference range is for nonfasting patients. Fasting glucose reference range is 70- 100.  Marland Kitchen BUN 05/06/2015 8.2  7.0 - 26.0 mg/dL Final  . Creatinine 05/06/2015 0.8  0.6 - 1.1 mg/dL Final  . Total Bilirubin 05/06/2015 0.46  0.20 - 1.20 mg/dL Final  . Alkaline Phosphatase 05/06/2015 121  40 - 150 U/L Final  . AST 05/06/2015 19  5 - 34 U/L Final  . ALT 05/06/2015 12  0 - 55 U/L Final  . Total Protein 05/06/2015 7.3  6.4 - 8.3 g/dL Final  . Albumin 05/06/2015 3.7  3.5 - 5.0 g/dL Final  . Calcium 05/06/2015 9.3  8.4 - 10.4 mg/dL Final  . Anion Gap 05/06/2015 8  3 - 11 mEq/L Final  . EGFR 05/06/2015 79* >90 ml/min/1.73 m2 Final   eGFR is calculated using the CKD-EPI Creatinine Equation (2009)       RADIOGRAPHIC STUDIES: Ct Chest W Contrast  05/10/2015  CLINICAL DATA:  Metastatic poorly differentiated gynecologic carcinoma, favor cervical origin per biopsy results. Pulmonary nodules visualized at the lung bases on recent CT abdomen study. EXAM: CT CHEST WITH CONTRAST TECHNIQUE: Multidetector CT imaging of the chest was performed during intravenous contrast administration. CONTRAST:  65m OMNIPAQUE IOHEXOL 300 MG/ML  SOLN COMPARISON:  04/26/2015 CT abdomen/pelvis. FINDINGS: Mediastinum/Nodes: Normal heart size. No pericardial fluid/thickening. Great vessels are normal in course  and caliber. No central pulmonary emboli. Heterogeneously hypodense 1.4 cm posterior right thyroid lobe nodule. Normal esophagus. No axillary adenopathy. Mildly enlarged 1.0 cm left level 4 neck node (series 2/ image 11). No pathologically enlarged mediastinal or right hilar nodes. Mildly enlarged 1.1 cm left hilar node (series 2/ image 29). Lungs/Pleura: No pneumothorax. No pleural effusion. There are innumerable (> than 40) pulmonary nodules of various sizes randomly distributed throughout both lungs, with representative nodules including a 0.9 cm right upper lobe nodule (series 5/ image 21), a 1.1 cm basilar right lower lobe nodule (series 5/ image 44) and a 1.1 cm left lower lobe pulmonary nodule (series 5/ image 44). No acute consolidative airspace disease. Mildly thickened parenchymal band in the left upper lobe in keeping with scarring or atelectasis. Upper abdomen: There are 3 scattered hypodense liver masses in the visualized liver, largest 3.7 cm in segment 4A of the left liver lobe. Musculoskeletal: No aggressive appearing focal osseous lesions. Stable mild compression deformity of the superior T11 endplate. Moderate degenerative changes in the thoracic spine. IMPRESSION: 1. Innumerable (> than 40) pulmonary nodules randomly distributed throughout both lungs, largest 1.1 cm, in keeping with pulmonary metastases. 2. Left supraclavicular and left hilar nodal metastases. 3. Re- demonstration of liver metastases. 4. Right thyroid lobe 1.4 cm pulmonary nodule, for which no further evaluation is recommended. This follows ACR consensus guidelines: Managing Incidental Thyroid Nodules Detected on Imaging: White Paper of the ACR Incidental Thyroid Findings Committee. J Am Coll Radiol 2015; 12:143-150. 5. Stable mild superior T11 vertebral compression deformity. Electronically Signed   By: JIlona SorrelM.D.   On: 05/10/2015 09:42    ASSESSMENT/PLAN:    Rash Patient has been diagnosed with a pelvic mass; which  is most likely ovarian in nature.  She's also been diagnosed with both lung metastasis  and liver metastasis.  She presented to the [facility] today to receive chemotherapy education for her planned carboplatin/Taxol chemotherapy to begin later this week.  Patient states that she intermittently develops a chronic rash to the upper sacral area that is very pruritic.  She denies any pain to the area.  She states that the rash comes and goes.  Patient also denies any recent fevers or chills.  Exam today reveals 2 blisterlike lesions to the top of her sacral region.  There is no surrounding erythema, edema, tenderness, warmth, or red streaks.  Was able to obtained a wound culture of the lesion.  Quite possibly-these lesions are herpetic in nature.  Will prescribe valacyclovir 1 g 3 times per day for treatment of herpetic lesions.  Also, patient will return this coming Thursday, 05/12/2015 to receive her first cycle of chemotherapy.  Have advised the patient will do a brief recheck of these lesions prior to her initiating the chemotherapy at that time.  Patient was advised to call/return gradually to the emergency department for any worsening symptoms whatsoever.  Metastatic cancer [facility]) Patient has been diagnosed with a pelvic mass; which is most likely ovarian in nature.  She's also been diagnosed with both lung metastasis and liver metastasis.  She presented to the [facility] today to receive chemotherapy education for her planned carboplatin/Taxol chemotherapy to begin Thursday, 05/12/2015.  Her chemotherapy will be cycled on an every three-week basis.  Patient also underwent a staging CT of the chest with contrast earlier today.  CT revealed: IMPRESSION: 1. Innumerable (> than 40) pulmonary nodules randomly distributed throughout both lungs, largest 1.1 cm, in keeping with pulmonary metastases. 2. Left supraclavicular and left hilar nodal metastases. 3. Re- demonstration of liver  metastases. 4. Right thyroid lobe 1.4 cm pulmonary nodule, for which no further evaluation is recommended. This follows ACR consensus guidelines: Managing Incidental Thyroid Nodules Detected on Imaging: White Paper of the ACR Incidental Thyroid Findings Committee. J Am Coll Radiol 2015; 12:143-150. 5. Stable mild superior T11 vertebral compression deformity.  Will recheck patient's rash on Thursday, 05/12/2015 to ensure that it is slowly improving.        Patient stated understanding of all instructions; and was in agreement with this plan of care. The patient knows to call the clinic with any problems, questions or concerns.   Review/collaboration with Dr. Marko Plume regarding all aspects of patient's visit today.   Total time spent with patient was 25 minutes;  with greater than 75 percent of that time spent in face to face counseling regarding patient's symptoms,  and coordination of care and follow up.  Disclaimer:This dictation was prepared with Dragon/digital dictation along with [employer]. Any transcriptional errors that result from this process are unintentional.  Drue Second, NP 05/10/2015

## 2015-05-10 NOTE — Assessment & Plan Note (Signed)
Patient has been diagnosed with a pelvic mass; which is most likely ovarian in nature.  She's also been diagnosed with both lung metastasis and liver metastasis.  She presented to the [facility] today to receive chemotherapy education for her planned carboplatin/Taxol chemotherapy to begin later this week.  Patient states that she intermittently develops a chronic rash to the upper sacral area that is very pruritic.  She denies any pain to the area.  She states that the rash comes and goes.  Patient also denies any recent fevers or chills.  Exam today reveals 2 blisterlike lesions to the top of her sacral region.  There is no surrounding erythema, edema, tenderness, warmth, or red streaks.  Was able to obtained a wound culture of the lesion.  Quite possibly-these lesions are herpetic in nature.  Will prescribe valacyclovir 1 g 3 times per day for treatment of herpetic lesions.  Also, patient will return this coming Thursday, 05/12/2015 to receive her first cycle of chemotherapy.  Have advised the patient will do a brief recheck of these lesions prior to her initiating the chemotherapy at that time.  Patient was advised to call/return gradually to the emergency department for any worsening symptoms whatsoever.

## 2015-05-10 NOTE — Assessment & Plan Note (Signed)
Patient has been diagnosed with a pelvic mass; which is most likely ovarian in nature.  She's also been diagnosed with both lung metastasis and liver metastasis.  She presented to the [facility] today to receive chemotherapy education for her planned carboplatin/Taxol chemotherapy to begin Thursday, 05/12/2015.  Her chemotherapy will be cycled on an every three-week basis.  Patient also underwent a staging CT of the chest with contrast earlier today.  CT revealed: IMPRESSION: 1. Innumerable (> than 40) pulmonary nodules randomly distributed throughout both lungs, largest 1.1 cm, in keeping with pulmonary metastases. 2. Left supraclavicular and left hilar nodal metastases. 3. Re- demonstration of liver metastases. 4. Right thyroid lobe 1.4 cm pulmonary nodule, for which no further evaluation is recommended. This follows ACR consensus guidelines: Managing Incidental Thyroid Nodules Detected on Imaging: White Paper of the ACR Incidental Thyroid Findings Committee. J Am Coll Radiol 2015; 12:143-150. 5. Stable mild superior T11 vertebral compression deformity.  Will recheck patient's rash on Thursday, 05/12/2015 to ensure that it is slowly improving.

## 2015-05-10 NOTE — Progress Notes (Signed)
Called patient and left voicemail. Introduced myself as Estate manager/land agent and to see if she would like an [place] appointment when she returns on 05/12/15. Left my contact name and number.

## 2015-05-10 NOTE — Progress Notes (Signed)
Pt reported to Chemo education class today with a concern of a Shingles outbreak. POF sent to scheduling. No lab orders needed.

## 2015-05-12 ENCOUNTER — Encounter: Payer: Self-pay | Admitting: Oncology

## 2015-05-12 ENCOUNTER — Encounter: Payer: Self-pay | Admitting: Nurse Practitioner

## 2015-05-12 ENCOUNTER — Ambulatory Visit (HOSPITAL_BASED_OUTPATIENT_CLINIC_OR_DEPARTMENT_OTHER): Payer: Commercial Managed Care - HMO | Admitting: Nurse Practitioner

## 2015-05-12 ENCOUNTER — Ambulatory Visit (HOSPITAL_BASED_OUTPATIENT_CLINIC_OR_DEPARTMENT_OTHER): Payer: Commercial Managed Care - HMO

## 2015-05-12 VITALS — BP 120/65 | HR 80 | Temp 97.7°F | Resp 18

## 2015-05-12 VITALS — BP 120/60 | HR 62 | Temp 97.6°F | Resp 18

## 2015-05-12 DIAGNOSIS — Z5111 Encounter for antineoplastic chemotherapy: Secondary | ICD-10-CM | POA: Diagnosis not present

## 2015-05-12 DIAGNOSIS — R21 Rash and other nonspecific skin eruption: Secondary | ICD-10-CM | POA: Diagnosis not present

## 2015-05-12 DIAGNOSIS — C799 Secondary malignant neoplasm of unspecified site: Secondary | ICD-10-CM

## 2015-05-12 DIAGNOSIS — C7800 Secondary malignant neoplasm of unspecified lung: Secondary | ICD-10-CM | POA: Diagnosis not present

## 2015-05-12 DIAGNOSIS — C787 Secondary malignant neoplasm of liver and intrahepatic bile duct: Secondary | ICD-10-CM | POA: Diagnosis not present

## 2015-05-12 DIAGNOSIS — C801 Malignant (primary) neoplasm, unspecified: Secondary | ICD-10-CM

## 2015-05-12 MED ORDER — PACLITAXEL CHEMO INJECTION 300 MG/50ML
175.0000 mg/m2 | Freq: Once | INTRAVENOUS | Status: AC
  Administered 2015-05-12: 330 mg via INTRAVENOUS
  Filled 2015-05-12: qty 55

## 2015-05-12 MED ORDER — SODIUM CHLORIDE 0.9 % IV SOLN
Freq: Once | INTRAVENOUS | Status: AC
  Administered 2015-05-12: 09:00:00 via INTRAVENOUS

## 2015-05-12 MED ORDER — DIPHENHYDRAMINE HCL 50 MG/ML IJ SOLN
INTRAMUSCULAR | Status: AC
  Filled 2015-05-12: qty 1

## 2015-05-12 MED ORDER — FAMOTIDINE IN NACL 20-0.9 MG/50ML-% IV SOLN
INTRAVENOUS | Status: AC
  Filled 2015-05-12: qty 50

## 2015-05-12 MED ORDER — SODIUM CHLORIDE 0.9 % IV SOLN
441.5000 mg | Freq: Once | INTRAVENOUS | Status: AC
  Administered 2015-05-12: 440 mg via INTRAVENOUS
  Filled 2015-05-12: qty 44

## 2015-05-12 MED ORDER — FAMOTIDINE IN NACL 20-0.9 MG/50ML-% IV SOLN
20.0000 mg | Freq: Once | INTRAVENOUS | Status: AC
  Administered 2015-05-12: 20 mg via INTRAVENOUS

## 2015-05-12 MED ORDER — DIPHENHYDRAMINE HCL 50 MG/ML IJ SOLN
50.0000 mg | Freq: Once | INTRAMUSCULAR | Status: AC
  Administered 2015-05-12: 50 mg via INTRAVENOUS

## 2015-05-12 MED ORDER — SODIUM CHLORIDE 0.9 % IV SOLN
Freq: Once | INTRAVENOUS | Status: AC
  Administered 2015-05-12: 09:00:00 via INTRAVENOUS
  Filled 2015-05-12: qty 5

## 2015-05-12 MED ORDER — VALACYCLOVIR HCL 1 G PO TABS: 1000.0000 mg | ORAL_TABLET | Freq: Every day | ORAL | Status: DC

## 2015-05-12 MED ORDER — SODIUM CHLORIDE 0.9 % IV SOLN
Freq: Once | INTRAVENOUS | Status: AC
  Administered 2015-05-12: 09:00:00 via INTRAVENOUS
  Filled 2015-05-12: qty 4

## 2015-05-12 NOTE — Patient Instructions (Signed)
[facility] Discharge Instructions for Patients Receiving Chemotherapy  Today you received the following chemotherapy agents Taxol and Carboplatin. To help prevent nausea and vomiting after your treatment, we encourage you to take your nausea medication as directed.  If you develop nausea and vomiting that is not controlled by your nausea medication, call the clinic.   BELOW ARE SYMPTOMS THAT SHOULD BE REPORTED IMMEDIATELY:  *FEVER GREATER THAN 100.5 F  *CHILLS WITH OR WITHOUT FEVER  NAUSEA AND VOMITING THAT IS NOT CONTROLLED WITH YOUR NAUSEA MEDICATION  *UNUSUAL SHORTNESS OF BREATH  *UNUSUAL BRUISING OR BLEEDING  TENDERNESS IN MOUTH AND THROAT WITH OR WITHOUT PRESENCE OF ULCERS  *URINARY PROBLEMS  *BOWEL PROBLEMS  UNUSUAL RASH Items with * indicate a potential emergency and should be followed up as soon as possible.  Feel free to call the clinic you have any questions or concerns. The clinic phone number is [phone].  Please show the CHEMO ALERT CARD at check-in to the Emergency Department and triage nurse.    

## 2015-05-12 NOTE — Progress Notes (Signed)
Went to treatment area to introduce myself as Estate manager/land agent and to see if she was interested in applying for any financial assistance. Asked patient of she has met her OOP for [facility] and she states no. I advised her that there is one foundation that currently has assistance for her diagnosis and asked if she was interested in applying. Patient said yes. Asked patient if she could bring her household proof of income when she returns on 05/19/15. Patient said yes and agreed to meet with me after her appointment with the dr. Hanley Seamen patient my card with date and what to bring on the back.

## 2015-05-12 NOTE — Progress Notes (Signed)
SYMPTOM MANAGEMENT CLINIC   HPI: Katherine Boone 70 y.o. female diagnosed with probable ovarian cancer with both lung and liver metastasis.  Presents to the [facility] today to initiate her first cycle of carboplatin/Taxol chemotherapy regimen.   Patient has been diagnosed with a pelvic mass; which is most likely ovarian in nature.  She's also been diagnosed with both lung metastasis and liver metastasis.  She presented to the [facility] today to receive chemotherapy education for her planned carboplatin/Taxol chemotherapy to begin later this week.  Patient states that she intermittently develops a chronic rash to the upper sacral area that is very pruritic.  She denies any pain to the area.  She states that the rash comes and goes. Will Patient also denies any recent fevers or chills.  Exam today reveals 2 blisterlike lesions to the top of her sacral region.  There is no surrounding erythema, edema, tenderness, warmth, or red streaks.  Was able to obtained a wound culture of the lesion.  Quite possibly-these lesions are herpetic in nature.  Will prescribe valacyclovir 1 g 3 times per day for treatment of herpetic lesions.  Also, patient will return this coming Thursday, 05/12/2015 to receive her first cycle of chemotherapy.  Have advised the patient will do a brief recheck of these lesions prior to her initiating the chemotherapy at that time.  Patient was advised to call/return gradually to the emergency department for any worsening symptoms whatsoever. ______________________________________  Update: Patient in for recheck of the lesions to her lower back prior to the initiation of her first cycle of chemotherapy this morning.  Patient has been taking valacyclovir 1 g 3 times per day as directed.  Wound culture of lesions is still pending results.  The lesions have not progressed; and do appear to be slowly resolving.  They are almost entirely crusted at this time.  There is no  surrounding erythema, edema, warmth, tenderness, or red streaks.  Patient continues to deny any pain associated with the rash.  She denies any pruritus as well.  Advised patient to continue with the valacyclovir 1 g 3 times per day until completed.  Will then prescribed a prophylactic dose of valacyclovir at 1 g daily to continue while she is on chemotherapy.  (Confirmed this dose with pharmacy). New prescription for valacyclovir at the preventative dose will be called into patient's pharmacy.  HPI  ROS  Past Medical History  Diagnosis Date  . GERD (gastroesophageal reflux disease)     diet controlled  . Migraines     none recently  . Family history of cancer     stomach, colon, lung, throat  . Gout 03/2005    ?  Marland Kitchen Osteoporosis 02/2011    DEXA 12/12 - Lspine -2.6; femur -3.8    Past Surgical History  Procedure Laterality Date  . Appendectomy  1960    has GERD (gastroesophageal reflux disease); Family history of cancer; Urinary urgency; Medicare annual wellness visit, initial; Insomnia; Osteoporosis; Dizziness; HLD (hyperlipidemia); Health maintenance examination; Advanced care planning/counseling discussion; Renal insufficiency; Constipation; Lower abdominal pain; Metastatic cancer ([place]); Pelvic mass in female; Malignant neoplasm metastatic to right lung ([place]); Malignant neoplasm metastatic to left lung ([place]); [employer] Gyne[employer] Ob[employer] (FIGO) stage IVB malignant neoplasm of cervix ([place]); and Rash on her problem list.    has No Known Allergies.    Medication List       This list is accurate as of: 05/12/15 11:24 AM.  Always use your  most recent med list.               alendronate 70 MG tablet  Commonly known as:  FOSAMAX  Take 1 tablet (70 mg total) by mouth every 7 (seven) days. Take with a full glass of water on an empty stomach.     Calcium Carbonate-Vit D-Min 1200-1000 MG-UNIT Chew  Chew 1 tablet by mouth daily.     cyanocobalamin 100 MCG  tablet  Take 100 mcg by mouth daily. Reported on 04/29/2015     dexamethasone 4 MG tablet  Commonly known as:  DECADRON  Take 5 tablets (45m) with food 12 hours and 6 hours before taxol     LORazepam 0.5 MG tablet  Commonly known as:  ATIVAN  Take 1 tablet (0.5 mg total) by mouth every 6 (six) hours as needed (nausea). Or under the tongue. Will make drowsy.     omeprazole 20 MG capsule  Commonly known as:  PRILOSEC  Take 1 capsule (20 mg total) by mouth daily.     ondansetron 8 MG tablet  Commonly known as:  ZOFRAN  Take 1 tablet (8 mg total) by mouth every 8 (eight) hours as needed for nausea or vomiting. Will not make drowsy     polyethylene glycol packet  Commonly known as:  MIRALAX / GLYCOLAX  Take 17 g by mouth 2 (two) times daily.     valACYclovir 1000 MG tablet  Commonly known as:  VALTREX  Take 1 tablet (1,000 mg total) by mouth 3 (three) times daily.     valACYclovir 1000 MG tablet  Commonly known as:  VALTREX  Take 1 tablet (1,000 mg total) by mouth daily. Change to prophylactic dose after initial high dose.     Vitamin D3 1000 units Caps  Take 1 capsule (1,000 Units total) by mouth daily.         PHYSICAL EXAMINATION  Oncology Vitals 05/12/2015 05/12/2015  Temp 97 97.7  Pulse 67 72  Resp 16 16  SpO2 95 95   BP Readings from Last 2 Encounters:  05/12/15 120/65  05/12/15 113/64    Physical Exam  Constitutional: She is oriented to person, place, and time and well-developed, well-nourished, and in no distress.  HENT:  Head: Normocephalic and atraumatic.  Eyes: Conjunctivae and EOM are normal. Pupils are equal, round, and reactive to light. Right eye exhibits no discharge. Left eye exhibits no discharge. No scleral icterus.  Neck: Normal range of motion.  Pulmonary/Chest: Effort normal. No respiratory distress.  Musculoskeletal: Normal range of motion.  Neurological: She is alert and oriented to person, place, and time. Gait normal.  Skin: Skin is warm and  dry. Rash noted. No erythema. No pallor.  Lesions to central lower back have not progressed since last exam.  Lesions appear to have crusted over; and there is no surrounding erythema, edema, warmth, or tenderness.  Psychiatric: Affect normal.    LABORATORY DATA:. No visits with results within 3 Day(s) from this visit. Latest known visit with results is:  Appointment on 05/06/2015  Component Date Value Ref Range Status  . WBC 05/06/2015 6.5  3.9 - 10.3 10e3/uL Final  . NEUT# 05/06/2015 4.6  1.5 - 6.5 10e3/uL Final  . HGB 05/06/2015 13.9  11.6 - 15.9 g/dL Final  . HCT 05/06/2015 41.7  34.8 - 46.6 % Final  . Platelets 05/06/2015 230  145 - 400 10e3/uL Final  . MCV 05/06/2015 85.6  79.5 - 101.0 fL Final  . MCH  05/06/2015 28.5  25.1 - 34.0 pg Final  . MCHC 05/06/2015 33.3  31.5 - 36.0 g/dL Final  . RBC 05/06/2015 4.87  3.70 - 5.45 10e6/uL Final  . RDW 05/06/2015 13.2  11.2 - 14.5 % Final  . lymph# 05/06/2015 1.2  0.9 - 3.3 10e3/uL Final  . MONO# 05/06/2015 0.6  0.1 - 0.9 10e3/uL Final  . Eosinophils Absolute 05/06/2015 0.1  0.0 - 0.5 10e3/uL Final  . Basophils Absolute 05/06/2015 0.0  0.0 - 0.1 10e3/uL Final  . NEUT% 05/06/2015 70.9  38.4 - 76.8 % Final  . LYMPH% 05/06/2015 18.2  14.0 - 49.7 % Final  . MONO% 05/06/2015 8.9  0.0 - 14.0 % Final  . EOS% 05/06/2015 1.7  0.0 - 7.0 % Final  . BASO% 05/06/2015 0.3  0.0 - 2.0 % Final  . nRBC 05/06/2015 0  0 - 0 % Final  . Sodium 05/06/2015 143  136 - 145 mEq/L Final  . Potassium 05/06/2015 4.1  3.5 - 5.1 mEq/L Final  . Chloride 05/06/2015 108  98 - 109 mEq/L Final  . CO2 05/06/2015 26  22 - 29 mEq/L Final  . Glucose 05/06/2015 92  70 - 140 mg/dl Final   Glucose reference range is for nonfasting patients. Fasting glucose reference range is 70- 100.  Marland Kitchen BUN 05/06/2015 8.2  7.0 - 26.0 mg/dL Final  . Creatinine 05/06/2015 0.8  0.6 - 1.1 mg/dL Final  . Total Bilirubin 05/06/2015 0.46  0.20 - 1.20 mg/dL Final  . Alkaline Phosphatase 05/06/2015  121  40 - 150 U/L Final  . AST 05/06/2015 19  5 - 34 U/L Final  . ALT 05/06/2015 12  0 - 55 U/L Final  . Total Protein 05/06/2015 7.3  6.4 - 8.3 g/dL Final  . Albumin 05/06/2015 3.7  3.5 - 5.0 g/dL Final  . Calcium 05/06/2015 9.3  8.4 - 10.4 mg/dL Final  . Anion Gap 05/06/2015 8  3 - 11 mEq/L Final  . EGFR 05/06/2015 79* >90 ml/min/1.73 m2 Final   eGFR is calculated using the CKD-EPI Creatinine Equation (2009)     RADIOGRAPHIC STUDIES: Ct Chest W Contrast  05/10/2015  CLINICAL DATA:  Metastatic poorly differentiated gynecologic carcinoma, favor cervical origin per biopsy results. Pulmonary nodules visualized at the lung bases on recent CT abdomen study. EXAM: CT CHEST WITH CONTRAST TECHNIQUE: Multidetector CT imaging of the chest was performed during intravenous contrast administration. CONTRAST:  46m OMNIPAQUE IOHEXOL 300 MG/ML  SOLN COMPARISON:  04/26/2015 CT abdomen/pelvis. FINDINGS: Mediastinum/Nodes: Normal heart size. No pericardial fluid/thickening. Great vessels are normal in course and caliber. No central pulmonary emboli. Heterogeneously hypodense 1.4 cm posterior right thyroid lobe nodule. Normal esophagus. No axillary adenopathy. Mildly enlarged 1.0 cm left level 4 neck node (series 2/ image 11). No pathologically enlarged mediastinal or right hilar nodes. Mildly enlarged 1.1 cm left hilar node (series 2/ image 29). Lungs/Pleura: No pneumothorax. No pleural effusion. There are innumerable (> than 40) pulmonary nodules of various sizes randomly distributed throughout both lungs, with representative nodules including a 0.9 cm right upper lobe nodule (series 5/ image 21), a 1.1 cm basilar right lower lobe nodule (series 5/ image 44) and a 1.1 cm left lower lobe pulmonary nodule (series 5/ image 44). No acute consolidative airspace disease. Mildly thickened parenchymal band in the left upper lobe in keeping with scarring or atelectasis. Upper abdomen: There are 3 scattered hypodense liver masses  in the visualized liver, largest 3.7 cm in segment 4A of the  left liver lobe. Musculoskeletal: No aggressive appearing focal osseous lesions. Stable mild compression deformity of the superior T11 endplate. Moderate degenerative changes in the thoracic spine. IMPRESSION: 1. Innumerable (> than 40) pulmonary nodules randomly distributed throughout both lungs, largest 1.1 cm, in keeping with pulmonary metastases. 2. Left supraclavicular and left hilar nodal metastases. 3. Re- demonstration of liver metastases. 4. Right thyroid lobe 1.4 cm pulmonary nodule, for which no further evaluation is recommended. This follows ACR consensus guidelines: Managing Incidental Thyroid Nodules Detected on Imaging: White Paper of the ACR Incidental Thyroid Findings Committee. J Am Coll Radiol 2015; 12:143-150. 5. Stable mild superior T11 vertebral compression deformity. Electronically Signed   By: Ilona Sorrel M.D.   On: 05/10/2015 09:42    ASSESSMENT/PLAN:    Rash Patient has been diagnosed with a pelvic mass; which is most likely ovarian in nature.  She's also been diagnosed with both lung metastasis and liver metastasis.  She presented to the [facility] today to receive chemotherapy education for her planned carboplatin/Taxol chemotherapy to begin later this week.  Patient states that she intermittently develops a chronic rash to the upper sacral area that is very pruritic.  She denies any pain to the area.  She states that the rash comes and goes. Will Patient also denies any recent fevers or chills.  Exam today reveals 2 blisterlike lesions to the top of her sacral region.  There is no surrounding erythema, edema, tenderness, warmth, or red streaks.  Was able to obtained a wound culture of the lesion.  Quite possibly-these lesions are herpetic in nature.  Will prescribe valacyclovir 1 g 3 times per day for treatment of herpetic lesions.  Also, patient will return this coming Thursday, 05/12/2015 to receive her  first cycle of chemotherapy.  Have advised the patient will do a brief recheck of these lesions prior to her initiating the chemotherapy at that time.  Patient was advised to call/return gradually to the emergency department for any worsening symptoms whatsoever. ______________________________________  Update: Patient in for recheck of the lesions to her lower back prior to the initiation of her first cycle of chemotherapy this morning.  Patient has been taking valacyclovir 1 g 3 times per day as directed.  Wound culture of lesions is still pending results.  The lesions have not progressed; and do appear to be slowly resolving.  They are almost entirely crusted at this time.  There is no surrounding erythema, edema, warmth, tenderness, or red streaks.  Patient continues to deny any pain associated with the rash.  She denies any pruritus as well.  Advised patient to continue with the valacyclovir 1 g 3 times per day until completed.  Will then prescribed a prophylactic dose of valacyclovir at 1 g daily to continue while she is on chemotherapy.  (Confirmed this dose with pharmacy). New prescription for valacyclovir at the preventative dose will be called into patient's pharmacy.    Metastatic cancer [facility]) Patient presents to the [facility] today to receive cycle 1 of her carboplatin/Taxol chemotherapy regimen.  The herpetic rash to her lower back does appear to be slowly resolving.  Patient confirms that she is taking the valacyclovir as directed.  Patient is scheduled to return on 05/19/2015 for labs and a follow-up visit.  She knows to call in the interim with any new worries or concerns.  Patient stated understanding of all instructions; and was in agreement with this plan of care. The patient knows to call the clinic with any problems,  questions or concerns.   Review/collaboration with Dr. Marko Plume regarding all aspects of patient's visit today.   Total time spent with patient was 25  minutes;  with greater than 75 percent of that time spent in face to face counseling regarding patient's symptoms,  and coordination of care and follow up.  Disclaimer:This dictation was prepared with Dragon/digital dictation along with [employer]. Any transcriptional errors that result from this process are unintentional.  Drue Second, NP 05/12/2015

## 2015-05-12 NOTE — Assessment & Plan Note (Signed)
Patient presents to the [facility] today to receive cycle 1 of her carboplatin/Taxol chemotherapy regimen.  The herpetic rash to her lower back does appear to be slowly resolving.  Patient confirms that she is taking the valacyclovir as directed.  Patient is scheduled to return on 05/19/2015 for labs and a follow-up visit.  She knows to call in the interim with any new worries or concerns.

## 2015-05-12 NOTE — Assessment & Plan Note (Addendum)
Patient has been diagnosed with a pelvic mass; which is most likely ovarian in nature.  She's also been diagnosed with both lung metastasis and liver metastasis.  She presented to the [facility] today to receive chemotherapy education for her planned carboplatin/Taxol chemotherapy to begin later this week.  Patient states that she intermittently develops a chronic rash to the upper sacral area that is very pruritic.  She denies any pain to the area.  She states that the rash comes and goes. Will Patient also denies any recent fevers or chills.  Exam today reveals 2 blisterlike lesions to the top of her sacral region.  There is no surrounding erythema, edema, tenderness, warmth, or red streaks.  Was able to obtained a wound culture of the lesion.  Quite possibly-these lesions are herpetic in nature.  Will prescribe valacyclovir 1 g 3 times per day for treatment of herpetic lesions.  Also, patient will return this coming Thursday, 05/12/2015 to receive her first cycle of chemotherapy.  Have advised the patient will do a brief recheck of these lesions prior to her initiating the chemotherapy at that time.  Patient was advised to call/return gradually to the emergency department for any worsening symptoms whatsoever. ______________________________________  Update: Patient in for recheck of the lesions to her lower back prior to the initiation of her first cycle of chemotherapy this morning.  Patient has been taking valacyclovir 1 g 3 times per day as directed.  Wound culture of lesions is still pending results.  The lesions have not progressed; and do appear to be slowly resolving.  They are almost entirely crusted at this time.  There is no surrounding erythema, edema, warmth, tenderness, or red streaks.  Patient continues to deny any pain associated with the rash.  She denies any pruritus as well.  Advised patient to continue with the valacyclovir 1 g 3 times per day until completed.  Will  then prescribed a prophylactic dose of valacyclovir at 1 g daily to continue while she is on chemotherapy.  (Confirmed this dose with pharmacy). New prescription for valacyclovir at the preventative dose will be called into patient's pharmacy.

## 2015-05-15 ENCOUNTER — Emergency Department (HOSPITAL_COMMUNITY): Payer: Commercial Managed Care - HMO

## 2015-05-15 ENCOUNTER — Other Ambulatory Visit: Payer: Self-pay | Admitting: Oncology

## 2015-05-15 ENCOUNTER — Inpatient Hospital Stay (HOSPITAL_COMMUNITY)
Admission: EM | Admit: 2015-05-15 | Discharge: 2015-05-23 | DRG: 330 | Disposition: A | Discharge status: Home Health | Payer: Commercial Managed Care - HMO | Attending: Internal Medicine | Admitting: Internal Medicine

## 2015-05-15 ENCOUNTER — Encounter (HOSPITAL_COMMUNITY): Payer: Self-pay | Admitting: Family Medicine

## 2015-05-15 DIAGNOSIS — Z800 Family history of malignant neoplasm of digestive organs: Secondary | ICD-10-CM

## 2015-05-15 DIAGNOSIS — Z79899 Other long term (current) drug therapy: Secondary | ICD-10-CM | POA: Diagnosis not present

## 2015-05-15 DIAGNOSIS — M810 Age-related osteoporosis without current pathological fracture: Secondary | ICD-10-CM | POA: Diagnosis not present

## 2015-05-15 DIAGNOSIS — D701 Agranulocytosis secondary to cancer chemotherapy: Secondary | ICD-10-CM | POA: Diagnosis present

## 2015-05-15 DIAGNOSIS — C800 Disseminated malignant neoplasm, unspecified: Secondary | ICD-10-CM | POA: Diagnosis not present

## 2015-05-15 DIAGNOSIS — C7802 Secondary malignant neoplasm of left lung: Secondary | ICD-10-CM | POA: Diagnosis not present

## 2015-05-15 DIAGNOSIS — Z808 Family history of malignant neoplasm of other organs or systems: Secondary | ICD-10-CM | POA: Diagnosis not present

## 2015-05-15 DIAGNOSIS — B009 Herpesviral infection, unspecified: Secondary | ICD-10-CM

## 2015-05-15 DIAGNOSIS — C7801 Secondary malignant neoplasm of right lung: Secondary | ICD-10-CM | POA: Diagnosis present

## 2015-05-15 DIAGNOSIS — J3089 Other allergic rhinitis: Secondary | ICD-10-CM

## 2015-05-15 DIAGNOSIS — Z8249 Family history of ischemic heart disease and other diseases of the circulatory system: Secondary | ICD-10-CM | POA: Diagnosis not present

## 2015-05-15 DIAGNOSIS — K5902 Outlet dysfunction constipation: Secondary | ICD-10-CM | POA: Diagnosis not present

## 2015-05-15 DIAGNOSIS — C786 Secondary malignant neoplasm of retroperitoneum and peritoneum: Secondary | ICD-10-CM | POA: Diagnosis not present

## 2015-05-15 DIAGNOSIS — K5660 Unspecified intestinal obstruction: Secondary | ICD-10-CM | POA: Diagnosis not present

## 2015-05-15 DIAGNOSIS — N390 Urinary tract infection, site not specified: Secondary | ICD-10-CM | POA: Diagnosis present

## 2015-05-15 DIAGNOSIS — K5900 Constipation, unspecified: Secondary | ICD-10-CM

## 2015-05-15 DIAGNOSIS — C482 Malignant neoplasm of peritoneum, unspecified: Secondary | ICD-10-CM | POA: Diagnosis not present

## 2015-05-15 DIAGNOSIS — C7982 Secondary malignant neoplasm of genital organs: Secondary | ICD-10-CM | POA: Diagnosis not present

## 2015-05-15 DIAGNOSIS — C7800 Secondary malignant neoplasm of unspecified lung: Secondary | ICD-10-CM | POA: Diagnosis not present

## 2015-05-15 DIAGNOSIS — E876 Hypokalemia: Secondary | ICD-10-CM | POA: Diagnosis present

## 2015-05-15 DIAGNOSIS — T451X5A Adverse effect of antineoplastic and immunosuppressive drugs, initial encounter: Secondary | ICD-10-CM | POA: Diagnosis not present

## 2015-05-15 DIAGNOSIS — K56609 Unspecified intestinal obstruction, unspecified as to partial versus complete obstruction: Secondary | ICD-10-CM | POA: Insufficient documentation

## 2015-05-15 DIAGNOSIS — C539 Malignant neoplasm of cervix uteri, unspecified: Secondary | ICD-10-CM | POA: Diagnosis not present

## 2015-05-15 DIAGNOSIS — R1032 Left lower quadrant pain: Secondary | ICD-10-CM | POA: Diagnosis not present

## 2015-05-15 DIAGNOSIS — C799 Secondary malignant neoplasm of unspecified site: Secondary | ICD-10-CM

## 2015-05-15 DIAGNOSIS — K219 Gastro-esophageal reflux disease without esophagitis: Secondary | ICD-10-CM | POA: Diagnosis present

## 2015-05-15 DIAGNOSIS — R195 Other fecal abnormalities: Secondary | ICD-10-CM | POA: Diagnosis not present

## 2015-05-15 DIAGNOSIS — R109 Unspecified abdominal pain: Secondary | ICD-10-CM | POA: Diagnosis present

## 2015-05-15 DIAGNOSIS — C787 Secondary malignant neoplasm of liver and intrahepatic bile duct: Secondary | ICD-10-CM | POA: Diagnosis not present

## 2015-05-15 DIAGNOSIS — K5669 Other intestinal obstruction: Secondary | ICD-10-CM | POA: Diagnosis not present

## 2015-05-15 DIAGNOSIS — N189 Chronic kidney disease, unspecified: Secondary | ICD-10-CM | POA: Diagnosis not present

## 2015-05-15 HISTORY — DX: Malignant (primary) neoplasm, unspecified: C80.1

## 2015-05-15 LAB — CBC WITH DIFFERENTIAL/PLATELET
Basophils Absolute: 0 10*3/uL (ref 0.0–0.1)
Basophils Relative: 0 %
EOS ABS: 0 10*3/uL (ref 0.0–0.7)
EOS PCT: 0 %
HCT: 42.7 % (ref 36.0–46.0)
Hemoglobin: 13.8 g/dL (ref 12.0–15.0)
LYMPHS ABS: 1.2 10*3/uL (ref 0.7–4.0)
Lymphocytes Relative: 16 %
MCH: 28.2 pg (ref 26.0–34.0)
MCHC: 32.3 g/dL (ref 30.0–36.0)
MCV: 87.3 fL (ref 78.0–100.0)
Monocytes Absolute: 0.2 10*3/uL (ref 0.1–1.0)
Monocytes Relative: 2 %
Neutro Abs: 6 10*3/uL (ref 1.7–7.7)
Neutrophils Relative %: 82 %
PLATELETS: 222 10*3/uL (ref 150–400)
RBC: 4.89 MIL/uL (ref 3.87–5.11)
RDW: 12.9 % (ref 11.5–15.5)
WBC: 7.3 10*3/uL (ref 4.0–10.5)

## 2015-05-15 LAB — URINALYSIS, ROUTINE W REFLEX MICROSCOPIC
BILIRUBIN URINE: NEGATIVE
Glucose, UA: NEGATIVE mg/dL
HGB URINE DIPSTICK: NEGATIVE
KETONES UR: NEGATIVE mg/dL
Nitrite: NEGATIVE
PROTEIN: NEGATIVE mg/dL
Specific Gravity, Urine: 1.013 (ref 1.005–1.030)
pH: 6.5 (ref 5.0–8.0)

## 2015-05-15 LAB — URINE MICROSCOPIC-ADD ON

## 2015-05-15 LAB — COMPREHENSIVE METABOLIC PANEL
ALK PHOS: 96 U/L (ref 38–126)
ALT: 36 U/L (ref 14–54)
AST: 35 U/L (ref 15–41)
Albumin: 3.8 g/dL (ref 3.5–5.0)
Anion gap: 9 (ref 5–15)
BILIRUBIN TOTAL: 1 mg/dL (ref 0.3–1.2)
BUN: 14 mg/dL (ref 6–20)
CHLORIDE: 104 mmol/L (ref 101–111)
CO2: 28 mmol/L (ref 22–32)
CREATININE: 0.69 mg/dL (ref 0.44–1.00)
Calcium: 8.8 mg/dL — ABNORMAL LOW (ref 8.9–10.3)
GFR calc Af Amer: >60 mL/min (ref >60)
Glucose, Bld: 114 mg/dL — ABNORMAL HIGH (ref 65–99)
Potassium: 4.7 mmol/L (ref 3.5–5.1)
Sodium: 141 mmol/L (ref 135–145)
Total Protein: 7 g/dL (ref 6.5–8.1)

## 2015-05-15 MED ORDER — IOHEXOL 300 MG/ML  SOLN
100.0000 mL | Freq: Once | INTRAMUSCULAR | Status: AC | PRN
  Administered 2015-05-15: 100 mL via INTRAVENOUS

## 2015-05-15 MED ORDER — ONDANSETRON HCL 4 MG/2ML IJ SOLN
4.0000 mg | Freq: Once | INTRAMUSCULAR | Status: DC
  Filled 2015-05-15 (×2): qty 2

## 2015-05-15 MED ORDER — DEXTROSE 5 % IV SOLN
1.0000 g | Freq: Once | INTRAVENOUS | Status: AC
  Administered 2015-05-15: 1 g via INTRAVENOUS
  Filled 2015-05-15: qty 10

## 2015-05-15 MED ORDER — SODIUM CHLORIDE 0.9 % IV BOLUS (SEPSIS)
1000.0000 mL | Freq: Once | INTRAVENOUS | Status: AC
  Administered 2015-05-15: 1000 mL via INTRAVENOUS

## 2015-05-15 NOTE — ED Provider Notes (Signed)
CSN: [id number]     Arrival date & time 05/15/15  1831 History   First MD Initiated Contact with Patient 05/15/15 1947     Chief Complaint  Patient presents with  . Abdominal Pain    SHARIEKA MUIRHEAD is a 70 y.o. female with a history of cervical cancer with mets to her lungs who presents to the ED complaining of constipation. She reports she has not had a bowel movement and 6 days. She reports she has passed a small amount of gas. She reports lots of burping and belching. She complains of some intermittent lower abdominal cramping pain. She reports using MiraLAX and senna for her symptoms without relief. She also complains of some intermittent nausea. She denies current abdominal pain. She complains of some hematuria once this morning. Patient is going chemotherapy for her cancer and last chemotherapy 4 days ago. The patient denies fevers, vomiting, chest pain, shortness of breath, current abdominal pain, lightheadedness, dizziness, syncope, dysuria or rashes.   Patient is a 70 y.o. female presenting with abdominal pain. The history is provided by the patient and medical records. No language interpreter was used.  Abdominal Pain Associated symptoms: constipation, hematuria and nausea   Associated symptoms: no chest pain, no chills, no cough, no diarrhea, no dysuria, no fever, no shortness of breath, no sore throat, no vaginal bleeding, no vaginal discharge and no vomiting     Past Medical History  Diagnosis Date  . GERD (gastroesophageal reflux disease)     diet controlled  . Migraines     none recently  . Family history of cancer     stomach, colon, lung, throat  . Gout 03/2005    ?  Marland Kitchen Osteoporosis 02/2011    DEXA 12/12 - Lspine -2.6; femur -3.8  . Cancer [facility])     Cervical   Past Surgical History  Procedure Laterality Date  . Appendectomy  1960   Family History  Problem Relation Age of Onset  . Heart disease Mother   . Hypertension Mother   . Cancer Mother 63    colon and  stomach  . Cancer Brother     stomach and throat, smoker  . Ovarian cancer      maternal neice  . Hypertension Sister   . Cancer Brother     stomach and lung, smoker  . COPD Brother   . Throat cancer Brother   . Cancer Father     stomach cancer  . Stroke Sister   . Diabetes Neg Hx   . Coronary artery disease Neg Hx   . Cancer Other     niece - stomach cancer   Social History  Substance Use Topics  . Smoking status: Never Smoker   . Smokeless tobacco: Never Used  . Alcohol Use: No   OB History    No data available     Review of Systems  Constitutional: Negative for fever and chills.  HENT: Negative for congestion and sore throat.   Eyes: Negative for visual disturbance.  Respiratory: Negative for cough, shortness of breath and wheezing.   Cardiovascular: Negative for chest pain and palpitations.  Gastrointestinal: Positive for nausea, abdominal pain, constipation and abdominal distention. Negative for vomiting and diarrhea.  Genitourinary: Positive for hematuria. Negative for dysuria, urgency, frequency, decreased urine volume, vaginal bleeding, vaginal discharge and difficulty urinating.  Musculoskeletal: Negative for back pain and neck pain.  Skin: Negative for rash.  Neurological: Negative for syncope, weakness and headaches.  Allergies  Review of patient's allergies indicates no known allergies.  Home Medications   Prior to Admission medications   Medication Sig Start Date End Date Taking? Authorizing Provider  dexamethasone (DECADRON) 4 MG tablet Take 5 tablets (20mg ) with food 12 hours and 6 hours before taxol 05/09/15  Yes Lennis P Livesay, MD  omeprazole (PRILOSEC) 20 MG capsule Take 1 capsule (20 mg total) by mouth daily. 04/28/15  Yes [name], MD  ondansetron (ZOFRAN) 8 MG tablet Take 1 tablet (8 mg total) by mouth every 8 (eight) hours as needed for nausea or vomiting. Will not make drowsy 05/09/15  Yes Lennis Marion Downer, MD  polyethylene glycol  (MIRALAX / GLYCOLAX) packet Take 17 g by mouth 2 (two) times daily.   Yes Historical Provider, MD  sennosides-docusate sodium (SENOKOT-S) 8.6-50 MG tablet Take 1 tablet by mouth 2 (two) times daily as needed for constipation.   Yes Historical Provider, MD  valACYclovir (VALTREX) 1000 MG tablet Take 1 tablet (1,000 mg total) by mouth 3 (three) times daily. 05/10/15  Yes Susanne Borders, NP  alendronate (FOSAMAX) 70 MG tablet Take 1 tablet (70 mg total) by mouth every 7 (seven) days. Take with a full glass of water on an empty stomach. 07/06/14   [name], MD  Calcium Carbonate-Vit D-Min 1200-1000 MG-UNIT CHEW Chew 1 tablet by mouth daily. 02/12/11   [name], MD  Cholecalciferol (VITAMIN D3) 1000 UNITS CAPS Take 1 capsule (1,000 Units total) by mouth daily. 08/02/14   [name], MD  cyanocobalamin 100 MCG tablet Take 100 mcg by mouth daily. Reported on 04/29/2015    Historical Provider, MD  LORazepam (ATIVAN) 0.5 MG tablet Take 1 tablet (0.5 mg total) by mouth every 6 (six) hours as needed (nausea). Or under the tongue. Will make drowsy. 05/09/15   Lennis Marion Downer, MD  valACYclovir (VALTREX) 1000 MG tablet Take 1 tablet (1,000 mg total) by mouth daily. Change to prophylactic dose after initial high dose. 05/12/15   Susanne Borders, NP   BP 119/86 mmHg  Pulse 83  Temp(Src) 98.7 F (37.1 C) (Oral)  Resp 18  Ht 5\' 7"  (1.702 m)  Wt 74.844 kg  BMI 25.84 kg/m2  SpO2 97% Physical Exam  Constitutional: She appears well-developed and well-nourished. No distress.  Nontoxic appearing.  HENT:  Head: Normocephalic and atraumatic.  Mouth/Throat: Oropharynx is clear and moist.  Eyes: Conjunctivae are normal. Pupils are equal, round, and reactive to light. Right eye exhibits no discharge. Left eye exhibits no discharge.  Neck: Neck supple.  Cardiovascular: Normal rate, regular rhythm, normal heart sounds and intact distal pulses.  Exam reveals no gallop and no friction rub.   No murmur  heard. Heart rate is 88.  Pulmonary/Chest: Effort normal and breath sounds normal. No respiratory distress. She has no wheezes. She has no rales.  Lungs are clear to auscultation bilaterally.  Abdominal: Soft. Bowel sounds are normal. She exhibits no distension and no mass. There is tenderness. There is no rebound and no guarding.  Abdomen is soft. Bowel sounds are present. Patient has mild bilateral and suprapubic abdominal tenderness to palpation. No peritoneal signs. No psoas or obturator sign. No CVA or flank tenderness.  Musculoskeletal: She exhibits no edema.  Lymphadenopathy:    She has no cervical adenopathy.  Neurological: She is alert. Coordination normal.  Skin: Skin is warm and dry. No rash noted. She is not diaphoretic. No erythema. No pallor.  Psychiatric: She has a normal mood and affect.  Her behavior is normal.  Nursing note and vitals reviewed.   ED Course  Procedures (including critical care time) Labs Review Labs Reviewed  COMPREHENSIVE METABOLIC PANEL - Abnormal; Notable for the following:    Glucose, Bld 114 (*)    Calcium 8.8 (*)    All other components within normal limits  URINALYSIS, ROUTINE W REFLEX MICROSCOPIC (NOT AT [facility]) - Abnormal; Notable for the following:    Leukocytes, UA MODERATE (*)    All other components within normal limits  URINE MICROSCOPIC-ADD ON - Abnormal; Notable for the following:    Squamous Epithelial / LPF 0-5 (*)    Bacteria, UA FEW (*)    All other components within normal limits  URINE CULTURE  CBC WITH DIFFERENTIAL/PLATELET  BASIC METABOLIC PANEL  CBC    Imaging Review Ct Abdomen Pelvis W Contrast  05/15/2015  CLINICAL DATA:  History of metastatic cervical cancer. Left lower quadrant pain. EXAM: CT ABDOMEN AND PELVIS WITH CONTRAST TECHNIQUE: Multidetector CT imaging of the abdomen and pelvis was performed using the standard protocol following bolus administration of intravenous contrast. CONTRAST:  174mL OMNIPAQUE IOHEXOL 300  MG/ML  SOLN COMPARISON:  04/26/2015 FINDINGS: Lower chest and abdominal wall: Pulmonary nodules from metastatic disease. No superimposed acute finding. Hepatobiliary: Hepatic metastases, largest in segment 4B measuring 38 mm. There is associated perfusion anomaly and peripheral biliary duct obstruction around this largest metastasis. Pancreas: Unremarkable. Spleen: Unremarkable. Adrenals/Urinary Tract: Negative adrenals. No hydronephrosis despite bulky pelvic mass. Left renal cyst. Unremarkable bladder. Reproductive:Bulky multi cystic pelvic mass with grossly similar appearance, again measuring up to 14 cm in maximal dimension. Based on biopsy this is cervical carcinoma. There is extensive contact and probable invasion of the rectum and lower sigmoid. Lower pelvic peritoneal nodules, the largest in the low left pericolic gutter measuring 28 mm and contacting the descending sigmoid junction. Another prominent nodule contacts the cecum. Retroperitoneal malignant adenopathy highest at the right para-aortic station at the level of the renal artery, measuring 30 mm. Stomach/Bowel: Diffuse narrowing of the rectum and distal sigmoid related to tumor deposits. Above the sigmoid there is diffuse stool. No evidence of perforation or abscess. Vascular/Lymphatic: No acute vascular abnormality. Adenopathy as described above. Musculoskeletal: No acute abnormalities. No metastatic deposit identified. Remote T11 compression fracture. IMPRESSION: Widely metastatic cervical carcinoma with unchanged appearance compared to 04/26/2015. Bulky tumor narrows and likely invades the rectum/distal sigmoid with progressive proximal stool retention. Electronically Signed   By: Monte Fantasia M.D.   On: 05/15/2015 23:09   I have personally reviewed and evaluated these images and lab results as part of my medical decision-making.   EKG Interpretation None      Filed Vitals:   05/15/15 1853 05/15/15 1857 05/15/15 2134 05/16/15 0057  BP:  100/71  116/73 119/86  Pulse: 107  82 83  Temp: 98.5 F (36.9 C)   98.7 F (37.1 C)  TempSrc: Oral   Oral  Resp: 16  18 18   Height:  5\' 7"  (1.702 m)  5\' 7"  (1.702 m)  Weight:  76.658 kg  74.844 kg  SpO2: 95%  97% 97%     MDM   Meds given in ED:  Medications  ondansetron (ZOFRAN) injection 4 mg (0 mg Intravenous Hold 05/15/15 2038)  0.9 %  sodium chloride infusion ( Intravenous New Bag/Given 05/16/15 0104)  acetaminophen (TYLENOL) tablet 650 mg (not administered)    Or  acetaminophen (TYLENOL) suppository 650 mg (not administered)  oxyCODONE (Oxy IR/ROXICODONE) immediate release tablet 5 mg (  not administered)  HYDROmorphone (DILAUDID) injection 0.5-1 mg (not administered)  ondansetron (ZOFRAN) tablet 4 mg (not administered)    Or  ondansetron (ZOFRAN) injection 4 mg (not administered)  alum & mag hydroxide-simeth (MAALOX/MYLANTA) 200-200-20 MG/5ML suspension 30 mL (not administered)  cefTRIAXone (ROCEPHIN) 1 g in dextrose 5 % 50 mL IVPB (not administered)  sodium chloride 0.9 % bolus 1,000 mL (0 mLs Intravenous Stopped 05/15/15 2147)  iohexol (OMNIPAQUE) 300 MG/ML solution 100 mL (100 mLs Intravenous Contrast Given 05/15/15 2209)  cefTRIAXone (ROCEPHIN) 1 g in dextrose 5 % 50 mL IVPB (0 g Intravenous Stopped 05/16/15 0027)    Current Discharge Medication List      Final diagnoses:  Small bowel obstruction [facility])   This is a 70 y.o. female with a history of cervical cancer with mets to her lungs who presents to the ED complaining of constipation. She reports she has not had a bowel movement and 6 days. She reports she has passed a small amount of gas. She reports lots of burping and belching. She complains of some intermittent lower abdominal cramping pain. She reports using MiraLAX and senna for her symptoms without relief. She also complains of some intermittent nausea. She denies current abdominal pain. She complains of some hematuria once this morning. Patient is going  chemotherapy for her cancer and last chemotherapy 4 days ago. On exam the patient is afebrile nontoxic appearing. She is some generalized abdominal tenderness to palpation that is worse in her lower quadrants. Urine shows moderate leukocytes with too numerous to count white blood cells. Urinary tract infection will be treated with a gram of Rocephin in the emergency department. Urine sent for culture. CMP and CBC are unremarkable. CT pelvis with contrast indicated a bulky tumor narrows and likely invades the rectum/distal sigmoid with progressive proximal stool retention. I called and consulted with general surgeon Dr. Hassell Done. He would like the patient admitted via medicine and he will have general surgery consult in the morning. He also recommended medical oncology consultation.  I consulted with hospitalist Dr. Arnoldo Morale who accepted the patient for admission. She requested MedSurg temporary admission orders.    Waynetta Pean, PA-C 05/16/15 [id number]  Charlesetta Shanks, MD 05/18/15 [phone]

## 2015-05-15 NOTE — ED Notes (Signed)
Patient is taking chemo for abd cancer. Pt reports she has not had a bowel movement since Monday. Pt has tried prune juice, Miralax, Senakot-S with no results. Last chemo treatment: Thursday.

## 2015-05-15 NOTE — H&P (Addendum)
[facility] Admission History and Physical       NABRIA WALAS [id number] DOB: 1945/10/19 DOA: 05/15/2015  Referring physician: EDP PCP: [name], MD  Specialists:   Chief Complaint: Constipation ABD Swelling and Pain  HPI: RISHA LORENZEN is a 70 y.o. female with recently diagnosed (04/26/2015)  Metastatic Cervical Cancer S/P first Chemo Rx  4 days ago who presents to the ED with complaints of Constipation x 6 days.   She has had Nausea mainly and had some but denies any Vomiting.   She has had ABD distension and Bloating.   She took Miralax without  Relief.  She denies any Fevers or Chills.  A  Ct Scan of the ABD/Pelvis revealed widely Metastatic Cervical Cancer unchanged from 04/26/2015.     General Surgery was consulted and to see in AM, recommended that patient be made NPO for possible procedure in AM due to Mechanical Obstruction   Review of Systems:  Constitutional: No Weight Loss, No Weight Gain, Night Sweats, Fevers, Chills, Dizziness, Light Headedness, Fatigue, or Generalized Weakness HEENT: No Headaches, Difficulty Swallowing,Tooth/Dental Problems,Sore Throat,  No Sneezing, Rhinitis, Ear Ache, Nasal Congestion, or Post Nasal Drip,  Cardio-vascular:  No Chest pain, Orthopnea, PND, Edema in Lower Extremities, Anasarca, Dizziness, Palpitations  Resp: No Dyspnea, No DOE, No Productive Cough, No Non-Productive Cough, No Hemoptysis, No Wheezing.    GI: No Heartburn, Indigestion, +Abdominal Pain and Distention, Nausea, Vomiting, Diarrhea, Constipation, Hematemesis, Hematochezia, Melena, Change in Bowel Habits,  Loss of Appetite  GU: No Dysuria, No Change in Color of Urine, No Urgency or Urinary Frequency, No Flank pain.  Musculoskeletal: No Joint Pain or Swelling, No Decreased Range of Motion, No Back Pain.  Neurologic: No Syncope, No Seizures, Muscle Weakness, Paresthesia, Vision Disturbance or Loss, No Diplopia, No Vertigo, No Difficulty Walking,  Skin: No Rash or  Lesions. Psych: No Change in Mood or Affect, No Depression or Anxiety, No Memory loss, No Confusion, or Hallucinations   Past Medical History  Diagnosis Date  . GERD (gastroesophageal reflux disease)     diet controlled  . Migraines     none recently  . Family history of cancer     stomach, colon, lung, throat  . Gout 03/2005    ?  Marland Kitchen Osteoporosis 02/2011    DEXA 12/12 - Lspine -2.6; femur -3.8  . Cancer [facility])     Cervical     Past Surgical History  Procedure Laterality Date  . Appendectomy  1960      Prior to Admission medications   Medication Sig Start Date End Date Taking? Authorizing Provider  dexamethasone (DECADRON) 4 MG tablet Take 5 tablets (20mg ) with food 12 hours and 6 hours before taxol 05/09/15  Yes Lennis P Livesay, MD  omeprazole (PRILOSEC) 20 MG capsule Take 1 capsule (20 mg total) by mouth daily. 04/28/15  Yes [name], MD  ondansetron (ZOFRAN) 8 MG tablet Take 1 tablet (8 mg total) by mouth every 8 (eight) hours as needed for nausea or vomiting. Will not make drowsy 05/09/15  Yes Lennis Marion Downer, MD  polyethylene glycol (MIRALAX / GLYCOLAX) packet Take 17 g by mouth 2 (two) times daily.   Yes Historical Provider, MD  sennosides-docusate sodium (SENOKOT-S) 8.6-50 MG tablet Take 1 tablet by mouth 2 (two) times daily as needed for constipation.   Yes Historical Provider, MD  valACYclovir (VALTREX) 1000 MG tablet Take 1 tablet (1,000 mg total) by mouth 3 (three) times daily. 05/10/15  Yes Derek Jack  Berniece Salines, NP  alendronate (FOSAMAX) 70 MG tablet Take 1 tablet (70 mg total) by mouth every 7 (seven) days. Take with a full glass of water on an empty stomach. 07/06/14   [name], MD  Calcium Carbonate-Vit D-Min 1200-1000 MG-UNIT CHEW Chew 1 tablet by mouth daily. 02/12/11   [name], MD  Cholecalciferol (VITAMIN D3) 1000 UNITS CAPS Take 1 capsule (1,000 Units total) by mouth daily. 08/02/14   [name], MD  cyanocobalamin 100 MCG tablet Take 100 mcg  by mouth daily. Reported on 04/29/2015    Historical Provider, MD  LORazepam (ATIVAN) 0.5 MG tablet Take 1 tablet (0.5 mg total) by mouth every 6 (six) hours as needed (nausea). Or under the tongue. Will make drowsy. 05/09/15   Lennis Marion Downer, MD  valACYclovir (VALTREX) 1000 MG tablet Take 1 tablet (1,000 mg total) by mouth daily. Change to prophylactic dose after initial high dose. 05/12/15   Susanne Borders, NP     No Known Allergies      Social History:  reports that she has never smoked. She has never used smokeless tobacco. She reports that she does not drink alcohol or use illicit drugs.     Family History  Problem Relation Age of Onset  . Heart disease Mother   . Hypertension Mother   . Cancer Mother 67    colon and stomach  . Cancer Brother     stomach and throat, smoker  . Ovarian cancer      maternal neice  . Hypertension Sister   . Cancer Brother     stomach and lung, smoker  . COPD Brother   . Throat cancer Brother   . Cancer Father     stomach cancer  . Stroke Sister   . Diabetes Neg Hx   . Coronary artery disease Neg Hx   . Cancer Other     niece - stomach cancer       Physical Exam:  GEN:  Pleasant Well Nourished and Well Developed 70 y.o. Caucasian female examined and in no acute distress; cooperative with exam Filed Vitals:   05/15/15 1853 05/15/15 1857 05/15/15 2134  BP: 100/71  116/73  Pulse: 107  82  Temp: 98.5 F (36.9 C)    TempSrc: Oral    Resp: 16  18  Height:  5\' 7"  (1.702 m)   Weight:  76.658 kg (169 lb)   SpO2: 95%  97%   Blood pressure 116/73, pulse 82, temperature 98.5 F (36.9 C), temperature source Oral, resp. rate 18, height 5\' 7"  (1.702 m), weight 76.658 kg (169 lb), SpO2 97 %. PSYCH: She is alert and oriented x4; does not appear anxious does not appear depressed; affect is normal HEENT: Normocephalic and Atraumatic, Mucous membranes pink; PERRLA; EOM intact; Fundi:  Benign;  No scleral icterus, Nares: Patent, Oropharynx: Clear,  Fair Dentition,    Neck:  FROM, No Cervical Lymphadenopathy nor Thyromegaly or Carotid Bruit; No JVD; Breasts:: Not examined CHEST WALL: No tenderness CHEST: Normal respiration, clear to auscultation bilaterally HEART: Regular rate and rhythm; no murmurs rubs or gallops BACK: No kyphosis or scoliosis; No CVA tenderness ABDOMEN: Positive Bowel Sounds, Mildly Distended, Mildly Tender, No Rebound or Guarding; No Masses, No Organomegaly. Rectal Exam: Not done EXTREMITIES: No Cyanosis, Clubbing, or Edema; No Ulcerations. Genitalia: not examined PULSES: 2+ and symmetric SKIN: Normal hydration no rash or ulceration CNS:  Alert and Oriented x 4, No Focal Deficits Vascular: pulses palpable throughout    Labs on  Admission:  Basic Metabolic Panel:  Recent Labs Lab 05/15/15 2109  NA 141  K 4.7  CL 104  CO2 28  GLUCOSE 114*  BUN 14  CREATININE 0.69  CALCIUM 8.8*   Liver Function Tests:  Recent Labs Lab 05/15/15 2109  AST 35  ALT 36  ALKPHOS 96  BILITOT 1.0  PROT 7.0  ALBUMIN 3.8   No results for input(s): LIPASE, AMYLASE in the last 168 hours. No results for input(s): AMMONIA in the last 168 hours. CBC:  Recent Labs Lab 05/15/15 2109  WBC 7.3  NEUTROABS 6.0  HGB 13.8  HCT 42.7  MCV 87.3  PLT 222   Cardiac Enzymes: No results for input(s): CKTOTAL, CKMB, CKMBINDEX, TROPONINI in the last 168 hours.  BNP (last 3 results) No results for input(s): BNP in the last 8760 hours.  ProBNP (last 3 results) No results for input(s): PROBNP in the last 8760 hours.  CBG: No results for input(s): GLUCAP in the last 168 hours.  Radiological Exams on Admission: Ct Abdomen Pelvis W Contrast  05/15/2015  CLINICAL DATA:  History of metastatic cervical cancer. Left lower quadrant pain. EXAM: CT ABDOMEN AND PELVIS WITH CONTRAST TECHNIQUE: Multidetector CT imaging of the abdomen and pelvis was performed using the standard protocol following bolus administration of intravenous  contrast. CONTRAST:  111mL OMNIPAQUE IOHEXOL 300 MG/ML  SOLN COMPARISON:  04/26/2015 FINDINGS: Lower chest and abdominal wall: Pulmonary nodules from metastatic disease. No superimposed acute finding. Hepatobiliary: Hepatic metastases, largest in segment 4B measuring 38 mm. There is associated perfusion anomaly and peripheral biliary duct obstruction around this largest metastasis. Pancreas: Unremarkable. Spleen: Unremarkable. Adrenals/Urinary Tract: Negative adrenals. No hydronephrosis despite bulky pelvic mass. Left renal cyst. Unremarkable bladder. Reproductive:Bulky multi cystic pelvic mass with grossly similar appearance, again measuring up to 14 cm in maximal dimension. Based on biopsy this is cervical carcinoma. There is extensive contact and probable invasion of the rectum and lower sigmoid. Lower pelvic peritoneal nodules, the largest in the low left pericolic gutter measuring 28 mm and contacting the descending sigmoid junction. Another prominent nodule contacts the cecum. Retroperitoneal malignant adenopathy highest at the right para-aortic station at the level of the renal artery, measuring 30 mm. Stomach/Bowel: Diffuse narrowing of the rectum and distal sigmoid related to tumor deposits. Above the sigmoid there is diffuse stool. No evidence of perforation or abscess. Vascular/Lymphatic: No acute vascular abnormality. Adenopathy as described above. Musculoskeletal: No acute abnormalities. No metastatic deposit identified. Remote T11 compression fracture. IMPRESSION: Widely metastatic cervical carcinoma with unchanged appearance compared to 04/26/2015. Bulky tumor narrows and likely invades the rectum/distal sigmoid with progressive proximal stool retention. Electronically Signed   By: Monte Fantasia M.D.   On: 05/15/2015 23:09         Assessment/Plan:     70 y.o. female with  Principal Problem:    Constipation by outlet obstruction    NPO    Procedure in AM by General Surgery    Active  Problems:    [employer]ne[employer] (FIGO) stage IVB malignant neoplasm of cervix ([place])    Notify Oncology in AM    Sees Dr Marko Plume    UTI    Urine C+S sent    IV Rocephin      DVT Prophylaxis    SCDs    Code Status:     FULL CODE        Family Communication:   Husband at Bedside   Disposition Plan:    Inpatient  Status        Time spent: [facility] Pager [phone]   If [place] Please Contact the Day Rounding Team MD for [facility]  If 7PM-7AM, Please Contact Night-Floor Coverage  [email or web address] Password [facility] 05/15/2015, 11:52 PM     ADDENDUM:   Patient was seen and examined on 05/15/2015

## 2015-05-16 ENCOUNTER — Inpatient Hospital Stay (HOSPITAL_COMMUNITY): Payer: Commercial Managed Care - HMO

## 2015-05-16 DIAGNOSIS — K5660 Unspecified intestinal obstruction: Secondary | ICD-10-CM

## 2015-05-16 DIAGNOSIS — Z800 Family history of malignant neoplasm of digestive organs: Secondary | ICD-10-CM

## 2015-05-16 DIAGNOSIS — M810 Age-related osteoporosis without current pathological fracture: Secondary | ICD-10-CM

## 2015-05-16 DIAGNOSIS — K219 Gastro-esophageal reflux disease without esophagitis: Secondary | ICD-10-CM

## 2015-05-16 DIAGNOSIS — N390 Urinary tract infection, site not specified: Secondary | ICD-10-CM | POA: Diagnosis present

## 2015-05-16 DIAGNOSIS — C482 Malignant neoplasm of peritoneum, unspecified: Secondary | ICD-10-CM

## 2015-05-16 LAB — BASIC METABOLIC PANEL
Anion gap: 9 (ref 5–15)
BUN: 11 mg/dL (ref 6–20)
CO2: 28 mmol/L (ref 22–32)
Calcium: 8.8 mg/dL — ABNORMAL LOW (ref 8.9–10.3)
Chloride: 103 mmol/L (ref 101–111)
Creatinine, Ser: 0.68 mg/dL (ref 0.44–1.00)
GFR calc Af Amer: >60 mL/min (ref >60)
GLUCOSE: 115 mg/dL — AB (ref 65–99)
POTASSIUM: 4.5 mmol/L (ref 3.5–5.1)
Sodium: 140 mmol/L (ref 135–145)

## 2015-05-16 LAB — CBC
HEMATOCRIT: 42 % (ref 36.0–46.0)
Hemoglobin: 13.8 g/dL (ref 12.0–15.0)
MCH: 28.6 pg (ref 26.0–34.0)
MCHC: 32.9 g/dL (ref 30.0–36.0)
MCV: 87.1 fL (ref 78.0–100.0)
Platelets: 191 10*3/uL (ref 150–400)
RBC: 4.82 MIL/uL (ref 3.87–5.11)
RDW: 12.9 % (ref 11.5–15.5)
WBC: 6 10*3/uL (ref 4.0–10.5)

## 2015-05-16 MED ORDER — ACETAMINOPHEN 650 MG RE SUPP: 650.0000 mg | Freq: Four times a day (QID) | RECTAL | Status: DC | PRN

## 2015-05-16 MED ORDER — LORAZEPAM 2 MG/ML IJ SOLN
1.0000 mg | Freq: Once | INTRAMUSCULAR | Status: AC
  Administered 2015-05-16: 1 mg via INTRAVENOUS
  Filled 2015-05-16: qty 1

## 2015-05-16 MED ORDER — HYDROMORPHONE HCL 1 MG/ML IJ SOLN: 0.5000 mg | INTRAMUSCULAR | Status: DC | PRN

## 2015-05-16 MED ORDER — DEXTROSE 5 % IV SOLN: 1.0000 g | INTRAVENOUS | Status: DC

## 2015-05-16 MED ORDER — SODIUM CHLORIDE 0.9 % IV SOLN
INTRAVENOUS | Status: DC
  Administered 2015-05-16: 01:00:00 via INTRAVENOUS
  Administered 2015-05-17: 1000 mL via INTRAVENOUS
  Administered 2015-05-19 (×2): via INTRAVENOUS

## 2015-05-16 MED ORDER — TBO-FILGRASTIM 300 MCG/0.5ML ~~LOC~~ SOSY
300.0000 ug | PREFILLED_SYRINGE | Freq: Every day | SUBCUTANEOUS | Status: DC
Start: 2015-05-16 — End: 2015-05-18
  Administered 2015-05-16 – 2015-05-17 (×2): 300 ug via SUBCUTANEOUS
  Filled 2015-05-16 (×3): qty 0.5

## 2015-05-16 MED ORDER — ONDANSETRON HCL 4 MG/2ML IJ SOLN
4.0000 mg | Freq: Four times a day (QID) | INTRAMUSCULAR | Status: DC | PRN
Start: 2015-05-16 — End: 2015-05-23
  Administered 2015-05-18 – 2015-05-21 (×3): 4 mg via INTRAVENOUS
  Filled 2015-05-16: qty 2

## 2015-05-16 MED ORDER — ZOLPIDEM TARTRATE 5 MG PO TABS
5.0000 mg | ORAL_TABLET | Freq: Once | ORAL | Status: DC
  Filled 2015-05-16: qty 1

## 2015-05-16 MED ORDER — OXYCODONE HCL 5 MG PO TABS: 5.0000 mg | ORAL_TABLET | ORAL | Status: DC | PRN

## 2015-05-16 MED ORDER — ALUM & MAG HYDROXIDE-SIMETH 200-200-20 MG/5ML PO SUSP: 30.0000 mL | Freq: Four times a day (QID) | ORAL | Status: DC | PRN

## 2015-05-16 MED ORDER — PANTOPRAZOLE SODIUM 40 MG IV SOLR
40.0000 mg | Freq: Every day | INTRAVENOUS | Status: DC
  Administered 2015-05-16 – 2015-05-22 (×7): 40 mg via INTRAVENOUS
  Filled 2015-05-16 (×9): qty 40

## 2015-05-16 MED ORDER — DEXTROSE 5 % IV SOLN
1.0000 g | INTRAVENOUS | Status: DC
  Administered 2015-05-16 – 2015-05-20 (×5): 1 g via INTRAVENOUS
  Filled 2015-05-16 (×6): qty 10

## 2015-05-16 MED ORDER — ACETAMINOPHEN 325 MG PO TABS: 650.0000 mg | ORAL_TABLET | Freq: Four times a day (QID) | ORAL | Status: DC | PRN

## 2015-05-16 MED ORDER — ONDANSETRON HCL 4 MG PO TABS: 4.0000 mg | ORAL_TABLET | Freq: Four times a day (QID) | ORAL | Status: DC | PRN

## 2015-05-16 NOTE — Progress Notes (Signed)
[facility] PROGRESS NOTE  Katherine Boone [id number] DOB: [birth year] DOA: 05/15/2015 PCP: [name], MD  Assessment/Plan: 1-nausea/abd pain and constipation: secondary to SBO/outlet obstruction -continue IVF's -PRN analgesics and antiemetics -no vomiting currently -continue NPO status (sisp and ice chips for comfort permitted by [facility] rec's) -will follow rec's from [facility]  2-cervical cancer with metastasis and with narrowing/invasion distal rectum/sigmoid colon: -oncology service consulted -actively receiving chemotherapy  3-GERD -will continue IV PPI for now  4-UTI: -empirically started on rocephin -reported frequency and dysuria prior to admission -follow culture data  5-DVT: -SCD's for now  Code Status: Full Family Communication: no family at bedside  Disposition Plan: to be determine; remains inpatient and NPO (except for sips and ice chips); continue IVF's.   Consultants:  Oncology service (Dr. Marko Plume)   [facility]   Procedures:  CT scan shows: Widely metastatic cervical carcinoma with unchanged appearance compared to 04/26/2015. Bulky tumor narrows and likely invades the rectum/distal sigmoid with progressive proximal stool retention. She has hepatic metastasis, multiple intraabdominal nodes, and retroperitoneal adenopathy, on the current CT.   Antibiotics:  Rocephin 3/12  HPI/Subjective: Afebrile, denies vomiting. Complaining of lower abd pain and nausea.  Objective: Filed Vitals:   05/16/15 0506 05/16/15 0931  BP: 124/58 130/68  Pulse: 80 82  Temp: 98.3 F (36.8 C) 98.4 F (36.9 C)  Resp: 18 18    Intake/Output Summary (Last 24 hours) at 05/16/15 1024 Last data filed at 05/16/15 0927  Gross per 24 hour  Intake    420 ml  Output    250 ml  Net    170 ml   Filed Weights   05/15/15 1857 05/16/15 0057  Weight: 76.658 kg (169 lb) 74.844 kg (165 lb)    Exam:   General:  Afebrile, complaining of abd distension and and lower abd pain.  No vomiting. Feeling nauseated and burping a lot. Last BM 05/09/15  Cardiovascular: S1 and S2, no rubs or gallops  Respiratory: good air movement, no wheezing  Abdomen: soft, tender to palpation lower abd, no BS appreciated on exam  Musculoskeletal: no edema, no cyanosis   Data Reviewed: Basic Metabolic Panel:  Recent Labs Lab 05/15/15 2109 05/16/15 0413  NA 141 140  K 4.7 4.5  CL 104 103  CO2 28 28  GLUCOSE 114* 115*  BUN 14 11  CREATININE 0.69 0.68  CALCIUM 8.8* 8.8*   Liver Function Tests:  Recent Labs Lab 05/15/15 2109  AST 35  ALT 36  ALKPHOS 96  BILITOT 1.0  PROT 7.0  ALBUMIN 3.8   CBC:  Recent Labs Lab 05/15/15 2109 05/16/15 0413  WBC 7.3 6.0  NEUTROABS 6.0  --   HGB 13.8 13.8  HCT 42.7 42.0  MCV 87.3 87.1  PLT 222 191   CBG: No results for input(s): GLUCAP in the last 168 hours.  Recent Results (from the past 240 hour(s))  Culture, Virus     Status: Abnormal (Preliminary result)   Collection Time: 05/10/15 11:50 AM  Result Value Ref Range Status   Viral Culture: Comment (A)  Preliminary    Comment: Positive for Herpes simplex virus type-2. Typing was confirmed by monoclonal antibody microscopic immunofluorescence.      Studies: Ct Abdomen Pelvis W Contrast  05/15/2015  CLINICAL DATA:  History of metastatic cervical cancer. Left lower quadrant pain. EXAM: CT ABDOMEN AND PELVIS WITH CONTRAST TECHNIQUE: Multidetector CT imaging of the abdomen and pelvis was performed using the standard protocol following bolus administration of intravenous contrast.  CONTRAST:  163mL OMNIPAQUE IOHEXOL 300 MG/ML  SOLN COMPARISON:  04/26/2015 FINDINGS: Lower chest and abdominal wall: Pulmonary nodules from metastatic disease. No superimposed acute finding. Hepatobiliary: Hepatic metastases, largest in segment 4B measuring 38 mm. There is associated perfusion anomaly and peripheral biliary duct obstruction around this largest metastasis. Pancreas: Unremarkable. Spleen:  Unremarkable. Adrenals/Urinary Tract: Negative adrenals. No hydronephrosis despite bulky pelvic mass. Left renal cyst. Unremarkable bladder. Reproductive:Bulky multi cystic pelvic mass with grossly similar appearance, again measuring up to 14 cm in maximal dimension. Based on biopsy this is cervical carcinoma. There is extensive contact and probable invasion of the rectum and lower sigmoid. Lower pelvic peritoneal nodules, the largest in the low left pericolic gutter measuring 28 mm and contacting the descending sigmoid junction. Another prominent nodule contacts the cecum. Retroperitoneal malignant adenopathy highest at the right para-aortic station at the level of the renal artery, measuring 30 mm. Stomach/Bowel: Diffuse narrowing of the rectum and distal sigmoid related to tumor deposits. Above the sigmoid there is diffuse stool. No evidence of perforation or abscess. Vascular/Lymphatic: No acute vascular abnormality. Adenopathy as described above. Musculoskeletal: No acute abnormalities. No metastatic deposit identified. Remote T11 compression fracture. IMPRESSION: Widely metastatic cervical carcinoma with unchanged appearance compared to 04/26/2015. Bulky tumor narrows and likely invades the rectum/distal sigmoid with progressive proximal stool retention. Electronically Signed   By: Monte Fantasia M.D.   On: 05/15/2015 23:09    Scheduled Meds: . cefTRIAXone (ROCEPHIN)  IV  1 g Intravenous Q24H  . ondansetron (ZOFRAN) IV  4 mg Intravenous Once  . Tbo-filgastrim (GRANIX) SQ  300 mcg Subcutaneous Daily   Continuous Infusions: . sodium chloride 75 mL/hr at 05/16/15 0104    Principal Problem:   Constipation by outlet obstruction Active Problems:   [employer] Gyne[employer] and Obstetric[employer] (FIGO) stage IVB malignant neoplasm of cervix ([place])   Urinary tract infection, site not specified    Time spent: 30 minutes    Barton Dubois  [facility] Pager [phone]. If 7PM-7AM,  please contact night-coverage at [email or web address], password [facility] 05/16/2015, 10:24 AM  LOS: 1 day

## 2015-05-16 NOTE — Consult Note (Signed)
Reason for Consult:  SBO Referring Physician: Jana Hakim Oncology:  Dr. Evlyn Clines and Dr. Harrison Mons is an 70 y.o. female.  HPI: Pt presented to the ED last PM with no BM for 6 days 05/09/15.  She had tried prune juice, Miralax and Senakot S with no results.  She was having cramping, some intermittent nausea, one episode for hematuria. About 3 weeks ago she called her PCP with constipation, she had tried everything she knew and said there was just no stool coming thru.  She was seen and sent for a CT scan at [place] where she was told she had Stage III Ovarian cancer with liver and lung metastasis. She was seen by Dr. Josephina Shih and Dr. Marko Plume. Her last chemotherapy was started on 05/10/15, and she was seen on 05/12/15 with a rash at the Oncology clinic.  Constipation was her presenting symptom, and remains her major physical complaint.  Needle biopsy on 05/04/15 showed:  Soft Tissue Needle Core Biopsy, left ext iliac mass METASTATIC HIGH GRADE POORLY DIFFERENTIATED CARCINOMA CONSISTENT WITH CERVICAL ORIGIN Work up in the ED shows she is afebrile, and VSS.labs are normal except for possible UTI.   CT scan shows:  Widely metastatic cervical carcinoma with unchanged appearance compared to 04/26/2015. Bulky tumor narrows and likely invades the rectum/distal sigmoid with progressive proximal stool retention.  She has hepatic metastasis, multiple intraabdominal nodes, and retroperitoneal adenopathy, on the current CT.  We are ask to see.  Past Medical History  Diagnosis Date  Cervical cancer with metastasis to the liver, both lungs, omental nodules, mesenteric nodules, necrotic retroperitoneal lymphadenopathy             GERD (gastroesophageal reflux disease)    diet controlled  Migraines    none recently  Family history of cancer    stomach, colon, lung, throat  Gout 03/2005   ?  Osteoporosis 02/2011   DEXA 12/12 - Lspine -2.6; femur -3.8    Past Surgical History   Procedure Laterality Date  . Appendectomy  1960    Family History  Problem Relation Age of Onset  . Heart disease Mother   . Hypertension Mother   . Cancer Mother 90    colon and stomach  . Cancer Brother     stomach and throat, smoker  . Ovarian cancer      maternal neice  . Hypertension Sister   . Cancer Brother     stomach and lung, smoker  . COPD Brother   . Throat cancer Brother   . Cancer Father     stomach cancer  . Stroke Sister   . Diabetes Neg Hx   . Coronary artery disease Neg Hx   . Cancer Other     niece - stomach cancer    Social History:  reports that she has never smoked. She has never used smokeless tobacco. She reports that she does not drink alcohol or use illicit drugs.  She lives alone, has almost no family Tobacco:  None ETOH:  None Drugs:  None Former EMT/CNA    Allergies: No Known Allergies  Medications:  Prior to Admission:  Prescriptions prior to admission  Medication Sig Dispense Refill Last Dose  . dexamethasone (DECADRON) 4 MG tablet Take 5 tablets (37m) with food 12 hours and 6 hours before taxol 10 tablet 0 05/11/2015  . omeprazole (PRILOSEC) 20 MG capsule Take 1 capsule (20 mg total) by mouth daily. 30 capsule 3 05/14/2015 at Unknown time  .  ondansetron (ZOFRAN) 8 MG tablet Take 1 tablet (8 mg total) by mouth every 8 (eight) hours as needed for nausea or vomiting. Will not make drowsy 30 tablet 0 05/15/2015 at Unknown time  . polyethylene glycol (MIRALAX / GLYCOLAX) packet Take 17 g by mouth 2 (two) times daily.   05/14/2015 at Unknown time  . sennosides-docusate sodium (SENOKOT-S) 8.6-50 MG tablet Take 1 tablet by mouth 2 (two) times daily as needed for constipation.   05/14/2015 at Unknown time  . valACYclovir (VALTREX) 1000 MG tablet Take 1 tablet (1,000 mg total) by mouth 3 (three) times daily. 21 tablet 0 05/14/2015 at Unknown time  . alendronate (FOSAMAX) 70 MG tablet Take 1 tablet (70 mg total) by mouth every 7 (seven) days. Take with  a full glass of water on an empty stomach. 4 tablet 11 unknown  . Calcium Carbonate-Vit D-Min 1200-1000 MG-UNIT CHEW Chew 1 tablet by mouth daily.   unknown  . Cholecalciferol (VITAMIN D3) 1000 UNITS CAPS Take 1 capsule (1,000 Units total) by mouth daily. 30 capsule  unknown  . cyanocobalamin 100 MCG tablet Take 100 mcg by mouth daily. Reported on 04/29/2015   Taking  . LORazepam (ATIVAN) 0.5 MG tablet Take 1 tablet (0.5 mg total) by mouth every 6 (six) hours as needed (nausea). Or under the tongue. Will make drowsy. 20 tablet 0 hasnt started  . valACYclovir (VALTREX) 1000 MG tablet Take 1 tablet (1,000 mg total) by mouth daily. Change to prophylactic dose after initial high dose. 30 tablet 1 hasnt started   Scheduled: . cefTRIAXone (ROCEPHIN)  IV  1 g Intravenous Q24H  . ondansetron (ZOFRAN) IV  4 mg Intravenous Once  . Tbo-filgastrim (GRANIX) SQ  300 mcg Subcutaneous Daily   Continuous: . sodium chloride 75 mL/hr at 05/16/15 0104   HAL:[id number] **OR** acetaminophen, alum & mag hydroxide-simeth, HYDROmorphone (DILAUDID) injection, ondansetron **OR** ondansetron (ZOFRAN) IV, oxyCODONE Anti-infectives    Start     Dose/Rate Route Frequency Ordered Stop   05/17/15 0000  cefTRIAXone (ROCEPHIN) 1 g in dextrose 5 % 50 mL IVPB  Status:  Discontinued     1 g 100 mL/hr over 30 Minutes Intravenous Every 24 hours 05/16/15 0047 05/16/15 0048   05/16/15 2300  cefTRIAXone (ROCEPHIN) 1 g in dextrose 5 % 50 mL IVPB     1 g 100 mL/hr over 30 Minutes Intravenous Every 24 hours 05/16/15 0048     05/15/15 2330  cefTRIAXone (ROCEPHIN) 1 g in dextrose 5 % 50 mL IVPB     1 g 100 mL/hr over 30 Minutes Intravenous  Once 05/15/15 2318 05/16/15 0027      Results for orders placed or performed during the hospital encounter of 05/15/15 (from the past 48 hour(s))  Comprehensive metabolic panel     Status: Abnormal   Collection Time: 05/15/15  9:09 PM  Result Value Ref Range   Sodium 141 135 - 145 mmol/L    Potassium 4.7 3.5 - 5.1 mmol/L   Chloride 104 101 - 111 mmol/L   CO2 28 22 - 32 mmol/L   Glucose, Bld 114 (H) 65 - 99 mg/dL   BUN 14 6 - 20 mg/dL   Creatinine, Ser 0.69 0.44 - 1.00 mg/dL   Calcium 8.8 (L) 8.9 - 10.3 mg/dL   Total Protein 7.0 6.5 - 8.1 g/dL   Albumin 3.8 3.5 - 5.0 g/dL   AST 35 15 - 41 U/L   ALT 36 14 - 54 U/L   Alkaline Phosphatase 96  38 - 126 U/L   Total Bilirubin 1.0 0.3 - 1.2 mg/dL   GFR calc non Af Amer >60 >60 mL/min   GFR calc Af Amer >60 >60 mL/min    Comment: (NOTE) The eGFR has been calculated using the CKD EPI equation. This calculation has not been validated in all clinical situations. eGFR's persistently <60 mL/min signify possible Chronic Kidney Disease.    Anion gap 9 5 - 15  CBC with Differential     Status: None   Collection Time: 05/15/15  9:09 PM  Result Value Ref Range   WBC 7.3 4.0 - 10.5 K/uL   RBC 4.89 3.87 - 5.11 MIL/uL   Hemoglobin 13.8 12.0 - 15.0 g/dL   HCT 42.7 36.0 - 46.0 %   MCV 87.3 78.0 - 100.0 fL   MCH 28.2 26.0 - 34.0 pg   MCHC 32.3 30.0 - 36.0 g/dL   RDW 12.9 11.5 - 15.5 %   Platelets 222 150 - 400 K/uL   Neutrophils Relative % 82 %   Neutro Abs 6.0 1.7 - 7.7 K/uL   Lymphocytes Relative 16 %   Lymphs Abs 1.2 0.7 - 4.0 K/uL   Monocytes Relative 2 %   Monocytes Absolute 0.2 0.1 - 1.0 K/uL   Eosinophils Relative 0 %   Eosinophils Absolute 0.0 0.0 - 0.7 K/uL   Basophils Relative 0 %   Basophils Absolute 0.0 0.0 - 0.1 K/uL  Urinalysis, Routine w reflex microscopic     Status: Abnormal   Collection Time: 05/15/15  9:23 PM  Result Value Ref Range   Color, Urine YELLOW YELLOW   APPearance CLEAR CLEAR   Specific Gravity, Urine 1.013 1.005 - 1.030   pH 6.5 5.0 - 8.0   Glucose, UA NEGATIVE NEGATIVE mg/dL   Hgb urine dipstick NEGATIVE NEGATIVE   Bilirubin Urine NEGATIVE NEGATIVE   Ketones, ur NEGATIVE NEGATIVE mg/dL   Protein, ur NEGATIVE NEGATIVE mg/dL   Nitrite NEGATIVE NEGATIVE   Leukocytes, UA MODERATE (A) NEGATIVE   Urine microscopic-add on     Status: Abnormal   Collection Time: 05/15/15  9:23 PM  Result Value Ref Range   Squamous Epithelial / LPF 0-5 (A) NONE SEEN   WBC, UA TOO NUMEROUS TO COUNT 0 - 5 WBC/hpf   RBC / HPF 0-5 0 - 5 RBC/hpf   Bacteria, UA FEW (A) NONE SEEN  Basic metabolic panel     Status: Abnormal   Collection Time: 05/16/15  4:13 AM  Result Value Ref Range   Sodium 140 135 - 145 mmol/L   Potassium 4.5 3.5 - 5.1 mmol/L   Chloride 103 101 - 111 mmol/L   CO2 28 22 - 32 mmol/L   Glucose, Bld 115 (H) 65 - 99 mg/dL   BUN 11 6 - 20 mg/dL   Creatinine, Ser 0.68 0.44 - 1.00 mg/dL   Calcium 8.8 (L) 8.9 - 10.3 mg/dL   GFR calc non Af Amer >60 >60 mL/min   GFR calc Af Amer >60 >60 mL/min    Comment: (NOTE) The eGFR has been calculated using the CKD EPI equation. This calculation has not been validated in all clinical situations. eGFR's persistently <60 mL/min signify possible Chronic Kidney Disease.    Anion gap 9 5 - 15  CBC     Status: None   Collection Time: 05/16/15  4:13 AM  Result Value Ref Range   WBC 6.0 4.0 - 10.5 K/uL   RBC 4.82 3.87 - 5.11 MIL/uL  Hemoglobin 13.8 12.0 - 15.0 g/dL   HCT 42.0 36.0 - 46.0 %   MCV 87.1 78.0 - 100.0 fL   MCH 28.6 26.0 - 34.0 pg   MCHC 32.9 30.0 - 36.0 g/dL   RDW 12.9 11.5 - 15.5 %   Platelets 191 150 - 400 K/uL    Ct Abdomen Pelvis W Contrast  05/15/2015  CLINICAL DATA:  History of metastatic cervical cancer. Left lower quadrant pain. EXAM: CT ABDOMEN AND PELVIS WITH CONTRAST TECHNIQUE: Multidetector CT imaging of the abdomen and pelvis was performed using the standard protocol following bolus administration of intravenous contrast. CONTRAST:  134m OMNIPAQUE IOHEXOL 300 MG/ML  SOLN COMPARISON:  04/26/2015 FINDINGS: Lower chest and abdominal wall: Pulmonary nodules from metastatic disease. No superimposed acute finding. Hepatobiliary: Hepatic metastases, largest in segment 4B measuring 38 mm. There is associated perfusion anomaly and  peripheral biliary duct obstruction around this largest metastasis. Pancreas: Unremarkable. Spleen: Unremarkable. Adrenals/Urinary Tract: Negative adrenals. No hydronephrosis despite bulky pelvic mass. Left renal cyst. Unremarkable bladder. Reproductive:Bulky multi cystic pelvic mass with grossly similar appearance, again measuring up to 14 cm in maximal dimension. Based on biopsy this is cervical carcinoma. There is extensive contact and probable invasion of the rectum and lower sigmoid. Lower pelvic peritoneal nodules, the largest in the low left pericolic gutter measuring 28 mm and contacting the descending sigmoid junction. Another prominent nodule contacts the cecum. Retroperitoneal malignant adenopathy highest at the right para-aortic station at the level of the renal artery, measuring 30 mm. Stomach/Bowel: Diffuse narrowing of the rectum and distal sigmoid related to tumor deposits. Above the sigmoid there is diffuse stool. No evidence of perforation or abscess. Vascular/Lymphatic: No acute vascular abnormality. Adenopathy as described above. Musculoskeletal: No acute abnormalities. No metastatic deposit identified. Remote T11 compression fracture. IMPRESSION: Widely metastatic cervical carcinoma with unchanged appearance compared to 04/26/2015. Bulky tumor narrows and likely invades the rectum/distal sigmoid with progressive proximal stool retention. Electronically Signed   By: JMonte FantasiaM.D.   On: 05/15/2015 23:09    Review of Systems  Constitutional: Positive for weight loss (at least 5 pounds last 2-3 weeks, she isn't sure.  ).  HENT: Negative.   Eyes: Negative.   Respiratory: Positive for cough. Negative for hemoptysis, sputum production, shortness of breath and wheezing.   Cardiovascular: Negative.   Gastrointestinal: Positive for heartburn, nausea, vomiting (she vomited before she was seen, but since diagnosis she has been taking "nausea" medicine and has not vomited.  ), abdominal pain  (She feels full, she has a fullness over the mid upper abdomen and cramping lower abdomen mostly on the left.), constipation (NO BM since 05/09/15, she says the ones she had recently were "flat.") and blood in stool (some with last BM.). Negative for diarrhea.  Genitourinary:       Told she had a UTI in ED  Musculoskeletal:       Left plantar fasciitis   Skin: Negative.   Neurological: Negative.   Endo/Heme/Allergies: Negative.   Psychiatric/Behavioral: Positive for depression (some mild depression).   Blood pressure 124/58, pulse 80, temperature 98.3 F (36.8 C), temperature source Oral, resp. rate 18, height 5' 7"  (1.702 m), weight 74.844 kg (165 lb), SpO2 95 %. Physical Exam  Constitutional: She is oriented to person, place, and time. She appears well-developed and well-nourished. No distress.  HENT:  Head: Normocephalic and atraumatic.  Nose: Nose normal.  Eyes: EOM are normal. Right eye exhibits no discharge. Left eye exhibits no discharge. No scleral icterus.  Neck: Neck supple. No JVD present. No tracheal deviation present. No thyromegaly present.  Cardiovascular: Normal rate, regular rhythm, normal heart sounds and intact distal pulses.   No murmur heard. Respiratory: Effort normal and breath sounds normal. No respiratory distress. She has no wheezes. She has no rales. She exhibits no tenderness.  GI: Soft. She exhibits no distension and no mass. There is tenderness (she has some tenderness mostl left lower abdomnen on palpation.). There is no rebound and no guarding.  Musculoskeletal: She exhibits no edema or tenderness.  Lymphadenopathy:    She has no cervical adenopathy.  Neurological: She is alert and oriented to person, place, and time. No cranial nerve deficit.  Skin: Skin is warm and dry. No rash noted. She is not diaphoretic. No erythema. No pallor.  Psychiatric: She has a normal mood and affect. Her behavior is normal. Judgment and thought content normal.     Assessment/Plan: Sigmoid/rectal obstruction Cervical cancer with metastasis to the lungs, liver and possible to the rectum/sigmoid colon that presented with constipation. Hx of GERD Remote history of Migraines  Plan:  i would give her some ice chips and sips for comfort right now.  Dr. Marcello Moores will need to review the CT and see if there is some way to relieve her obstruction.  I would think she needs Oncology and Palliative to see her and discuss options also.  She was fine till 3-4 weeks ago.  We will follow with you and make further recommendations after Dr. Marcello Moores has seen the CT scans.       Tesla Keeler 05/16/2015, 8:41 AM

## 2015-05-16 NOTE — Progress Notes (Signed)
MEDICAL ONCOLOGY May 16, 2015, 8:31 AM  Hospital day 2 Antibiotics: Rocephin Chemotherapy: first taxol carboplatin 05-12-15 Granix to begin today  Appreciate hospitalist letting me know of admission 05-15-15 for possible low bowel obstruction, as she has recently been diagnosed with metastatic, poorly differentiated carcinoma thought gyn primary. She had first chemotherapy on 05-12-15. Blood counts are not yet at nadir, will begin granix now given rest of situation.    Subjective: No bowel movement since ~ 05-09-15, no flatus. Has taken multiple does of laxatives. Some nausea, no vomiting. Feels bloated. Aches in LE that began over weekend, consistent with taxol aches. Unpleasant taste from chemo. Denies SOB, cough, chest pain. No bleeding. Did not sleep last pm. Voiding frequently with IVF.   No central catheter Never colonoscopy  ONCOLOGIC HISTORY Patient was in her usual very good health until new cramping abdominal pain intermittently for last few months. She had some constipation with this, particularly severe when she saw PCP for this reason on 04-26-15. She has had no vaginal bleeding. She had no bowel obstruction by xray and bowels moved with miralax. CT AP 04-26-15 had 10.3 x 13.8 mixed cystic and solid mass involving uterus and extending posteriorly into cul de sac and into bilateral adnexal regions, sigmoid colon tethered to mass, no ascites, scattered necrotic lymph nodes in mesentery and omentum, no hydroureter, 3 lesions in liver up to 3.4 cm, and numerous bilateral pulmonary nodules in lung bases. CEA was 1.1 and CA 125 was 105; CBC and CMET 04-26-15 were entirely normal. 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 She had CT biopsy of pelvic mass in region of left psoas on 3-1-1 7. Pathology [phone], showed high grade poorly differentiated carcinoma with immunohistochemical stains suggesting cervical primary, which did not fit  clinical picture well. CT chest 05-10-15 showed multiple bilateral pulmonary nodules (>40),  3 liver lesions up to 3.7 cm, left supraclavicular node and left hilar nodes.  Chemotherapy was begun with first carboplatin and taxol on 05-12-15.    PAST MEDICAL/ SURGICAL HISTORY:  G2 Fractured right arm 2008-06-11 when fell on stairs Osteoporosis by bone density 12-Jun-2010 (T scores -2.6, -3.8) Appendectomy [facility] 05-27-14 negative Never colonoscopy. FOB negative 2010-06-12, 06-12-14  Flu vaccine 05-06-15    FAMILY HISTORY:   Father "stomach ca" in 69s Mother colon ca in 61s 5 brothers, one with "lung and stomach" smoker, 2 with "stomach", one with throat ca 3 sisters, one with heart problems no ca Niece ovarian age 11 Niece melanoma age 25 One son cerebral palsy, deceased   SOCIAL HISTORY:  From [place]. Separated, lives alone with 4 small dogs, cats, birds. Both children deceased, including son with cerebral palsy who died Jun 12, 2007 of aspiration pneumonia. Never smoker. 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   Objective: Vital signs in last 24 hours: Blood pressure 124/58, pulse 80, temperature 98.3 F (36.8 C), temperature source Oral, resp. rate 18, height 5\' 7"  (1.702 m), weight 165 lb (74.844 kg), SpO2 95 %.  Awake, alert, not in any acute discomfort lying supine on RA. Good historian, cooperative and very pleasant. No alopecia. PERRL, not icteric. Oral mucosa somewhat dry without lesions. No JVD. Lungs without wheezes or rales. Peripheral IV site ok, IVF at 75 cc/hr. Heart RRR no gallop. Abdomen soft, quiet, not tender, not tightly distended, no fluid wave. LE no edema, cords, tenderness. Moves  all extremities easily. PSYCH appropriate mood and affect.        Lab Results:  Recent Labs  05/15/15 2109 05/16/15 0413  WBC 7.3 6.0  HGB 13.8 13.8  HCT 42.7 42.0  PLT 222 191   ANC on 05-15-15 was 6.0.  MCV  87   BMET  Recent Labs  05/15/15 2109 05/16/15 0413  NA 141 140  K 4.7 4.5  CL 104 103  CO2 28 28  GLUCOSE 114* 115*  BUN 14 11  CREATININE 0.69 0.68  CALCIUM 8.8* 8.8*    Studies/Results: Ct Abdomen Pelvis W Contrast  05/15/2015  CLINICAL DATA:  History of metastatic cervical cancer. Left lower quadrant pain. EXAM: CT ABDOMEN AND PELVIS WITH CONTRAST TECHNIQUE: Multidetector CT imaging of the abdomen and pelvis was performed using the standard protocol following bolus administration of intravenous contrast. CONTRAST:  135mL OMNIPAQUE IOHEXOL 300 MG/ML  SOLN COMPARISON:  04/26/2015 FINDINGS: Lower chest and abdominal wall: Pulmonary nodules from metastatic disease. No superimposed acute finding. Hepatobiliary: Hepatic metastases, largest in segment 4B measuring 38 mm. There is associated perfusion anomaly and peripheral biliary duct obstruction around this largest metastasis. Pancreas: Unremarkable. Spleen: Unremarkable. Adrenals/Urinary Tract: Negative adrenals. No hydronephrosis despite bulky pelvic mass. Left renal cyst. Unremarkable bladder. Reproductive:Bulky multi cystic pelvic mass with grossly similar appearance, again measuring up to 14 cm in maximal dimension. Based on biopsy this is cervical carcinoma. There is extensive contact and probable invasion of the rectum and lower sigmoid. Lower pelvic peritoneal nodules, the largest in the low left pericolic gutter measuring 28 mm and contacting the descending sigmoid junction. Another prominent nodule contacts the cecum. Retroperitoneal malignant adenopathy highest at the right para-aortic station at the level of the renal artery, measuring 30 mm. Stomach/Bowel: Diffuse narrowing of the rectum and distal sigmoid related to tumor deposits. Above the sigmoid there is diffuse stool. No evidence of perforation or abscess. Vascular/Lymphatic: No acute vascular abnormality. Adenopathy as described above. Musculoskeletal: No acute abnormalities.  No metastatic deposit identified. Remote T11 compression fracture. IMPRESSION: Widely metastatic cervical carcinoma with unchanged appearance compared to 04/26/2015. Bulky tumor narrows and likely invades the rectum/distal sigmoid with progressive proximal stool retention. Electronically Signed   By: Monte Fantasia M.D.   On: 05/15/2015 23:09   CT images reviewed  Abdominal Xrays ordered now to follow up question of obstruction    Assessment/Plan:  1.Possible distal bowel obstruction in setting of advanced gyn cancer extensively involving pelvis. Never colonoscopy, normal CEA, no anemia or iron deficiency. Family history of colon and possibly other GI cancers. NPO, surgery to see.  2. Gyn malignancy with large pelvic mass involving uterus and bilateral adnexal regions, adenopathy, possible liver involvement and apparent pulmonary mets. Pathology of peritoneal mass is high grade poorly differentiated carcinoma with immunohistochemical pattern suggesting cervical primary. CT chest with multiple bilateral lung mets. Presenting symptoms were constipation, improved initially with miralax; see #1.  Chemotherapy with carboplatin taxol begiun 05-12-15 in attempt to improve disease, but will not be curative. Will start Granix now in attempt to maintain WBC in case surgery required.  3.flu vaccine 05-06-15 4. Osteoporosis by DEXA 2012, on calcium and D 5.up to date mammograms 6.GERD improved with prilosec 7 no advance directives   I will let gyn oncology know, those MDs not in [place] until end of this week.  Will follow with you. Please page me if needed between my rounds [place]

## 2015-05-17 ENCOUNTER — Telehealth: Payer: Self-pay

## 2015-05-17 DIAGNOSIS — K56609 Unspecified intestinal obstruction, unspecified as to partial versus complete obstruction: Secondary | ICD-10-CM | POA: Insufficient documentation

## 2015-05-17 LAB — URINE CULTURE

## 2015-05-17 LAB — VIRUS CULTURE

## 2015-05-17 MED ORDER — LORAZEPAM 2 MG/ML IJ SOLN
1.0000 mg | Freq: Once | INTRAMUSCULAR | Status: AC
  Administered 2015-05-17: 1 mg via INTRAVENOUS
  Filled 2015-05-17: qty 1

## 2015-05-17 MED ORDER — ZOLPIDEM TARTRATE 5 MG PO TABS: 5.0000 mg | ORAL_TABLET | Freq: Every evening | ORAL | Status: DC | PRN

## 2015-05-17 MED ORDER — ENOXAPARIN SODIUM 40 MG/0.4ML ~~LOC~~ SOLN
40.0000 mg | Freq: Every day | SUBCUTANEOUS | Status: DC
  Administered 2015-05-17: 40 mg via SUBCUTANEOUS
  Filled 2015-05-17 (×2): qty 0.4

## 2015-05-17 MED ORDER — CEFOTETAN DISODIUM 2 G IJ SOLR
2.0000 g | INTRAMUSCULAR | Status: DC
Start: 2015-05-17 — End: 2015-05-18
  Filled 2015-05-17: qty 2

## 2015-05-17 NOTE — Progress Notes (Signed)
MEDICAL ONCOLOGY May 17, 2015, 10:12 AM  Hospital day 3 Antibiotics: Rocephin Chemotherapy: cycle 1 carboplatin taxol 05-12-15 gCSF begun 05-16-15  EMR reviewed. Patient seen, no visitors here presently  Seen by surgery, plan for diverting colostomy on 05-18-15. CT information and update sent to Dr Fermin Schwab on 05-16-15, who confirms that she is not candidate for debulking surgery by gyn oncology, but is in agreement with colostomy by general surgery to palliate and to allow chemo to continue in attempt to control/ improve the local and metastatic disease.   Subjective: Intermittent abdominal cramping but not in any acute discomfort. Feels gas moving in abdomen, not passing anything per rectum. No vomiting, is NPO except ice chips. Denies SOB, cough. Able to sleep last pm. Has been up to BR and sat on side of bed; I have encouraged her to walk and to sit up in chair. Not complaining of taxol or granix aches today. Voiding frequently with IVF.  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   Objective: Vital signs in last 24 hours: Blood pressure 131/70, pulse 101, temperature 98.3 F (36.8 C), temperature source Oral, resp. rate 18, height 5\' 7"  (1.702 m), weight 165 lb (74.844 kg), SpO2 93 %. Awake, alert, looks comfortable in bed, sits up easily without assistance. Respirations not labored RA. Oral mucosa moist and clear. No alopecia. Lungs clear. Heart RRR. Abdomen moderately distended but soft, not tender, few BS. LE no edema, cords, tenderness, feet warm. Peripheral IV left forearm site ok. Moves easily, speech fluent and appropriate.   Intake/Output from previous day: 03/13 0701 - 03/14 0700 In: 1790 [I.V.:1790] Out: 1800 [Urine:1800] Intake/Output this shift:     Lab Results:  Recent Labs  05/15/15 2109 05/16/15 0413  WBC 7.3 6.0  HGB 13.8 13.8  HCT 42.7  42.0  PLT 222 191  No differential today, have changed daily CBC to daily CBC with diff due to recent chemo BMET  Recent Labs  05/15/15 2109 05/16/15 0413  NA 141 140  K 4.7 4.5  CL 104 103  CO2 28 28  GLUCOSE 114* 115*  BUN 14 11  CREATININE 0.69 0.68  CALCIUM 8.8* 8.8*    Studies/Results: Ct Abdomen Pelvis W Contrast  05/15/2015  CLINICAL DATA:  History of metastatic cervical cancer. Left lower quadrant pain. EXAM: CT ABDOMEN AND PELVIS WITH CONTRAST TECHNIQUE: Multidetector CT imaging of the abdomen and pelvis was performed using the standard protocol following bolus administration of intravenous contrast. CONTRAST:  148mL OMNIPAQUE IOHEXOL 300 MG/ML  SOLN COMPARISON:  04/26/2015 FINDINGS: Lower chest and abdominal wall: Pulmonary nodules from metastatic disease. No superimposed acute finding. Hepatobiliary: Hepatic metastases, largest in segment 4B measuring 38 mm. There is associated perfusion anomaly and peripheral biliary duct obstruction around this largest metastasis. Pancreas: Unremarkable. Spleen: Unremarkable. Adrenals/Urinary Tract: Negative adrenals. No hydronephrosis despite bulky pelvic mass. Left renal cyst. Unremarkable bladder. Reproductive:Bulky multi cystic pelvic mass with grossly similar appearance, again measuring up to 14 cm in maximal dimension. Based on biopsy this is cervical carcinoma. There is extensive contact and probable invasion of the rectum and lower sigmoid. Lower pelvic peritoneal nodules, the largest in the low left pericolic gutter measuring 28 mm and contacting the descending sigmoid junction. Another prominent nodule contacts the cecum. Retroperitoneal malignant adenopathy highest at the right para-aortic station at the level of the renal artery, measuring 30 mm. Stomach/Bowel:  Diffuse narrowing of the rectum and distal sigmoid related to tumor deposits. Above the sigmoid there is diffuse stool. No evidence of perforation or abscess. Vascular/Lymphatic:  No acute vascular abnormality. Adenopathy as described above. Musculoskeletal: No acute abnormalities. No metastatic deposit identified. Remote T11 compression fracture. IMPRESSION: Widely metastatic cervical carcinoma with unchanged appearance compared to 04/26/2015. Bulky tumor narrows and likely invades the rectum/distal sigmoid with progressive proximal stool retention. Electronically Signed   By: Monte Fantasia M.D.   On: 05/15/2015 23:09   Dg Abd 2 Views  05/16/2015  CLINICAL DATA:  No bowel movement for 1 week EXAM: ABDOMEN - 2 VIEW COMPARISON:  05/15/2015 FINDINGS: Scattered large and small bowel gas is noted. Fecal material is noted throughout the colon without definitive obstructive change. This is consistent with constipation and may be related to the patient's underlying cervical mass and apparent narrowing of the colon. Degenerative changes of lumbar spine are seen. No other focal abnormality is noted. IMPRESSION: Increased fecal burden within the colon likely related to degree of constipation secondary to the known pelvic mass. Electronically Signed   By: Inez Catalina M.D.   On: 05/16/2015 10:26     Assessment/Plan: 1.Distal large bowel obstruction in setting of advanced gyn cancer extensively involving pelvis. Never colonoscopy, normal CEA, no anemia or iron deficiency. Family history of colon and possibly other GI cancers. NPO. Plan diverting colostomy on 05-18-15. Note unable to do bowel prep and large stool thruout on plain xray 05-16-15.  2. Gyn malignancy with large pelvic mass involving uterus and bilateral adnexal regions, adenopathy, possible liver involvement and CT consistent with numerous bilateral pulmonary mets. Pathology of peritoneal mass is high grade poorly differentiated carcinoma with immunohistochemical pattern suggesting cervical primary. CT chest with multiple bilateral lung mets. Presenting symptoms were constipation, improved initially with miralax; see #1.  Chemotherapy with carboplatin taxol begiun 05-12-15 in attempt to improve disease, but will not be curative, which I have discussed with patient again today. Granix begun 05-16-15, WBC still ok but lower than 3-13 even with granix, expect counts to nadir within the week from first chemo.  Not candidate for gyn oncology debulking. 3.flu vaccine 05-06-15 4. 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 Needs to resume prophylactic acyclovir when able to take po, or could give IV if any recurrence while NPO   I will follow - thank you       Ahley Bulls P  Pager [phone]

## 2015-05-17 NOTE — Progress Notes (Signed)
ANTICOAGULATION CONSULT NOTE - Initial Consult  Pharmacy Consult for enoxaparin Indication: VTE prophylaxis  No Known Allergies  Patient Measurements: Height: _0  (170.2 cm) Weight: 165 lb (74.844 kg) IBW/kg (Calculated) : 61.6 Heparin Dosing Weight:   Vital Signs: Temp: 98.3 F (36.8 C) (03/14 0626) Temp Source: Oral (03/14 0626) BP: 131/70 mmHg (03/14 0626) Pulse Rate: 101 (03/14 0626)  Labs:  Recent Labs  05/15/15 2109 05/16/15 0413  HGB 13.8 13.8  HCT 42.7 42.0  PLT 222 191  CREATININE 0.69 0.68    Estimated Creatinine Clearance: 70.1 mL/min (by C-G formula based on Cr of 0.68).   Medical History: Past Medical History  Diagnosis Date  . GERD (gastroesophageal reflux disease)     diet controlled  . Migraines     none recently  . Family history of cancer     stomach, colon, lung, throat  . Gout 03/2005    ?  Marland Kitchen Osteoporosis 02/2011    DEXA 12/12 - Lspine -2.6; femur -3.8  . Cancer [facility])     Cervical    Assessment: 85 YOF presents with constipation and abdominal pain.  Recent diagnosis of met cervical cancer s/p chemo 3/9. Phamacy asked to dose enoxaparin for VTE prophylaxis.   Renal: CrCl > 80m/min CBC: WNL  Goal of Therapy:  Anti-Xa level 0.3-0.6 units/ml 4hrs after LMWH dose given  Plan:   Enoxaparin 430mSQ q24h  For diverting colostomy 3/15, discussed with surgery PA-C, he will d/w surgeon if dose needs to be held tomorrow.   Pharmacy to s/o note writing and follow post-op and monitor CBC with recent chemo.  Weekly SCr  DuDoreene ElandPharmD, BCPS.   Pager: [phone]/14/2017 10:58 AM

## 2015-05-17 NOTE — Progress Notes (Signed)
[facility] PROGRESS NOTE  Katherine Boone [id number] DOB: 19-Oct-1945 DOA: 05/15/2015 PCP: [name], MD  Brief history of admission and interim summary 70 y.o. female with recently diagnosed (04/26/2015) Metastatic Cervical Cancer S/P first Chemo Rx 4 days ago who presents to the ED with complaints of Constipation x 6 days. She has had Nausea mainly and had some but denies any Vomiting. She has had ABD distension and Bloating. She took Miralax without Relief. She denies any Fevers or Chills. A Ct Scan of the ABD/Pelvis revealed widely Metastatic Cervical Cancer unchanged from 04/26/2015.   Patient currently remains nothing by mouth and has plans for diverting colostomy on 05/18/15.  Assessment/Plan: 1-nausea/abd pain and constipation: secondary to SBO/outlet obstruction -continue IVF's -Will continue PRN analgesics and antiemetics -no vomiting currently -continue NPO status (sisp and ice chips for comfort permitted by [facility] rec's) -Per [facility] recommendations the plan is for diverting colostomy on 05/18/2015  2-cervical cancer with metastasis and with narrowing/invasion distal rectum/sigmoid colon: -oncology service consulted -actively receiving chemotherapy -Will follow recommendations  3-GERD -will continue IV PPI for now  4-UTI: -empirically started on rocephin -reported frequency and dysuria prior to admission -follow culture data -Patient currently without any dysuria or complaints. She remained afebrile and with normal WBCs.  5-DVT: -SCD's for now  Code Status: Full Family Communication: no family at bedside  Disposition Plan: to be determine; remains inpatient and NPO (except for sips and ice chips); continue IVF's.   Consultants:  Oncology service (Dr. Marko Plume)   [facility]   Procedures:  CT scan shows: Widely metastatic cervical carcinoma with unchanged appearance compared to 04/26/2015. Bulky tumor narrows and likely invades the  rectum/distal sigmoid with progressive proximal stool retention. She has hepatic metastasis, multiple intraabdominal nodes, and retroperitoneal adenopathy, on the current CT.   Planned diverting colostomy on 05/18/15  Antibiotics:  Rocephin 3/12  HPI/Subjective: Afebrile, denies vomiting. Complaining of lower abd pain and nausea. In agreement for diverting colostomy procedure in am   Objective: Filed Vitals:   05/17/15 1135 05/17/15 1503  BP: 135/60 127/68  Pulse: 89 92  Temp: 98.3 F (36.8 C) 98.2 F (36.8 C)  Resp: 18 18    Intake/Output Summary (Last 24 hours) at 05/17/15 1747 Last data filed at 05/17/15 1400  Gross per 24 hour  Intake   1790 ml  Output    500 ml  Net   1290 ml   Filed Weights   05/15/15 1857 05/16/15 0057  Weight: 76.658 kg (169 lb) 74.844 kg (165 lb)    Exam:   General:  Afebrile, complaining of abd distension and and lower abd pain; even improved with medications still present. No vomiting, but with some intermittent nausea. Last BM reported 05/09/15  Cardiovascular: S1 and S2, no rubs or gallops  Respiratory: good air movement, no wheezing  Abdomen: soft, tender to palpation lower abd, no BS appreciated on exam, mild distended   Musculoskeletal: no edema, no cyanosis   Data Reviewed: Basic Metabolic Panel:  Recent Labs Lab 05/15/15 2109 05/16/15 0413  NA 141 140  K 4.7 4.5  CL 104 103  CO2 28 28  GLUCOSE 114* 115*  BUN 14 11  CREATININE 0.69 0.68  CALCIUM 8.8* 8.8*   Liver Function Tests:  Recent Labs Lab 05/15/15 2109  AST 35  ALT 36  ALKPHOS 96  BILITOT 1.0  PROT 7.0  ALBUMIN 3.8   CBC:  Recent Labs Lab 05/15/15 2109 05/16/15 0413  WBC 7.3 6.0  NEUTROABS 6.0  --  HGB 13.8 13.8  HCT 42.7 42.0  MCV 87.3 87.1  PLT 222 191   CBG: No results for input(s): GLUCAP in the last 168 hours.  Recent Results (from the past 240 hour(s))  Culture, Virus     Status: Abnormal   Collection Time: 05/10/15 11:50 AM   Result Value Ref Range Status   Viral Culture: Comment (A)  Final    Comment: Positive for Herpes simplex virus type-2. Typing was confirmed by monoclonal antibody microscopic immunofluorescence.   Urine culture     Status: None   Collection Time: 05/15/15  9:23 PM  Result Value Ref Range Status   Specimen Description URINE, CLEAN CATCH  Final   Special Requests NONE  Final   Culture   Final    MULTIPLE SPECIES PRESENT, SUGGEST RECOLLECTION Performed at [facility]    Report Status 05/17/2015 FINAL  Final     Studies: Ct Abdomen Pelvis W Contrast  05/15/2015  CLINICAL DATA:  History of metastatic cervical cancer. Left lower quadrant pain. EXAM: CT ABDOMEN AND PELVIS WITH CONTRAST TECHNIQUE: Multidetector CT imaging of the abdomen and pelvis was performed using the standard protocol following bolus administration of intravenous contrast. CONTRAST:  133mL OMNIPAQUE IOHEXOL 300 MG/ML  SOLN COMPARISON:  04/26/2015 FINDINGS: Lower chest and abdominal wall: Pulmonary nodules from metastatic disease. No superimposed acute finding. Hepatobiliary: Hepatic metastases, largest in segment 4B measuring 38 mm. There is associated perfusion anomaly and peripheral biliary duct obstruction around this largest metastasis. Pancreas: Unremarkable. Spleen: Unremarkable. Adrenals/Urinary Tract: Negative adrenals. No hydronephrosis despite bulky pelvic mass. Left renal cyst. Unremarkable bladder. Reproductive:Bulky multi cystic pelvic mass with grossly similar appearance, again measuring up to 14 cm in maximal dimension. Based on biopsy this is cervical carcinoma. There is extensive contact and probable invasion of the rectum and lower sigmoid. Lower pelvic peritoneal nodules, the largest in the low left pericolic gutter measuring 28 mm and contacting the descending sigmoid junction. Another prominent nodule contacts the cecum. Retroperitoneal malignant adenopathy highest at the right para-aortic station at  the level of the renal artery, measuring 30 mm. Stomach/Bowel: Diffuse narrowing of the rectum and distal sigmoid related to tumor deposits. Above the sigmoid there is diffuse stool. No evidence of perforation or abscess. Vascular/Lymphatic: No acute vascular abnormality. Adenopathy as described above. Musculoskeletal: No acute abnormalities. No metastatic deposit identified. Remote T11 compression fracture. IMPRESSION: Widely metastatic cervical carcinoma with unchanged appearance compared to 04/26/2015. Bulky tumor narrows and likely invades the rectum/distal sigmoid with progressive proximal stool retention. Electronically Signed   By: Monte Fantasia M.D.   On: 05/15/2015 23:09   Dg Abd 2 Views  05/16/2015  CLINICAL DATA:  No bowel movement for 1 week EXAM: ABDOMEN - 2 VIEW COMPARISON:  05/15/2015 FINDINGS: Scattered large and small bowel gas is noted. Fecal material is noted throughout the colon without definitive obstructive change. This is consistent with constipation and may be related to the patient's underlying cervical mass and apparent narrowing of the colon. Degenerative changes of lumbar spine are seen. No other focal abnormality is noted. IMPRESSION: Increased fecal burden within the colon likely related to degree of constipation secondary to the known pelvic mass. Electronically Signed   By: Inez Catalina M.D.   On: 05/16/2015 10:26    Scheduled Meds: . cefoTEtan (CEFOTAN) IV  2 g Intravenous On Call to OR  . cefTRIAXone (ROCEPHIN)  IV  1 g Intravenous Q24H  . enoxaparin (LOVENOX) injection  40 mg Subcutaneous Daily  .  ondansetron (ZOFRAN) IV  4 mg Intravenous Once  . pantoprazole (PROTONIX) IV  40 mg Intravenous Daily  . Tbo-filgastrim (GRANIX) SQ  300 mcg Subcutaneous Daily   Continuous Infusions: . sodium chloride 1,000 mL (05/17/15 0213)    Principal Problem:   Constipation by outlet obstruction Active Problems:   [employer] Gyne[employer] Obstetric[employer] (FIGO)  stage IVB malignant neoplasm of cervix ([place])   Urinary tract infection, site not specified    Time spent: 30 minutes    Barton Dubois  [facility] Pager [phone]. If 7PM-7AM, please contact night-coverage at [email or web address], password [facility] 05/17/2015, 5:47 PM  LOS: 2 days

## 2015-05-17 NOTE — Progress Notes (Signed)
Subjective: Discussed diverting ostomy again, she understands, and has helped care for these in the past.    Objective: Vital signs in last 24 hours: Temp:  [98.2 F (36.8 C)-98.5 F (36.9 C)] 98.3 F (36.8 C) (03/14 0626) Pulse Rate:  [82-101] 101 (03/14 0626) Resp:  [18] 18 (03/14 0626) BP: (123-136)/(64-70) 131/70 mmHg (03/14 0626) SpO2:  [93 %-98 %] 93 % (03/14 0626) Last BM Date: 05/09/15 Afebrile, VSS Lab OK Intake/Output from previous day: 03/13 0701 - 03/14 0700 In: 1790 [I.V.:1790] Out: 1800 [Urine:1800] Intake/Output this shift:    General appearance: alert, cooperative and no distress GI: soft, a little distended, but not having much discomfort.  Lab Results:   Recent Labs  05/15/15 2109 05/16/15 0413  WBC 7.3 6.0  HGB 13.8 13.8  HCT 42.7 42.0  PLT 222 191    BMET  Recent Labs  05/15/15 2109 05/16/15 0413  NA 141 140  K 4.7 4.5  CL 104 103  CO2 28 28  GLUCOSE 114* 115*  BUN 14 11  CREATININE 0.69 0.68  CALCIUM 8.8* 8.8*   PT/INR No results for input(s): LABPROT, INR in the last 72 hours.   Recent Labs Lab 05/15/15 2109  AST 35  ALT 36  ALKPHOS 96  BILITOT 1.0  PROT 7.0  ALBUMIN 3.8     Lipase     Component Value Date/Time   LIPASE 37 04/26/2015 1705     Studies/Results: Ct Abdomen Pelvis W Contrast  05/15/2015  CLINICAL DATA:  History of metastatic cervical cancer. Left lower quadrant pain. EXAM: CT ABDOMEN AND PELVIS WITH CONTRAST TECHNIQUE: Multidetector CT imaging of the abdomen and pelvis was performed using the standard protocol following bolus administration of intravenous contrast. CONTRAST:  126mL OMNIPAQUE IOHEXOL 300 MG/ML  SOLN COMPARISON:  04/26/2015 FINDINGS: Lower chest and abdominal wall: Pulmonary nodules from metastatic disease. No superimposed acute finding. Hepatobiliary: Hepatic metastases, largest in segment 4B measuring 38 mm. There is associated perfusion anomaly and peripheral biliary duct obstruction  around this largest metastasis. Pancreas: Unremarkable. Spleen: Unremarkable. Adrenals/Urinary Tract: Negative adrenals. No hydronephrosis despite bulky pelvic mass. Left renal cyst. Unremarkable bladder. Reproductive:Bulky multi cystic pelvic mass with grossly similar appearance, again measuring up to 14 cm in maximal dimension. Based on biopsy this is cervical carcinoma. There is extensive contact and probable invasion of the rectum and lower sigmoid. Lower pelvic peritoneal nodules, the largest in the low left pericolic gutter measuring 28 mm and contacting the descending sigmoid junction. Another prominent nodule contacts the cecum. Retroperitoneal malignant adenopathy highest at the right para-aortic station at the level of the renal artery, measuring 30 mm. Stomach/Bowel: Diffuse narrowing of the rectum and distal sigmoid related to tumor deposits. Above the sigmoid there is diffuse stool. No evidence of perforation or abscess. Vascular/Lymphatic: No acute vascular abnormality. Adenopathy as described above. Musculoskeletal: No acute abnormalities. No metastatic deposit identified. Remote T11 compression fracture. IMPRESSION: Widely metastatic cervical carcinoma with unchanged appearance compared to 04/26/2015. Bulky tumor narrows and likely invades the rectum/distal sigmoid with progressive proximal stool retention. Electronically Signed   By: Monte Fantasia M.D.   On: 05/15/2015 23:09   Dg Abd 2 Views  05/16/2015  CLINICAL DATA:  No bowel movement for 1 week EXAM: ABDOMEN - 2 VIEW COMPARISON:  05/15/2015 FINDINGS: Scattered large and small bowel gas is noted. Fecal material is noted throughout the colon without definitive obstructive change. This is consistent with constipation and may be related to the patient's underlying cervical mass and  apparent narrowing of the colon. Degenerative changes of lumbar spine are seen. No other focal abnormality is noted. IMPRESSION: Increased fecal burden within the  colon likely related to degree of constipation secondary to the known pelvic mass. Electronically Signed   By: Inez Catalina M.D.   On: 05/16/2015 10:26    Medications: . cefTRIAXone (ROCEPHIN)  IV  1 g Intravenous Q24H  . ondansetron (ZOFRAN) IV  4 mg Intravenous Once  . pantoprazole (PROTONIX) IV  40 mg Intravenous Daily  . Tbo-filgastrim (GRANIX) SQ  300 mcg Subcutaneous Daily    Assessment/Plan Sigmoid/rectal obstruction Cervical cancer with metastasis to the lungs, liver and possible to the rectum/sigmoid colon that presented with constipation. Hx of GERD Remote history of Migraines UTI:   Antibiotics:  Day 3 Rocephin DVT: SCD/i will add Lovenox    Plan:  She is for laparoscopic diverting colostomy tomorrow  I have labs, and EKG ordered for baseline. Dr. Marko Plume say her yesterday and strted her on Granix to support WBC count.  i would allow her some ice and sips of clears for comfort.      LOS: 2 days    Danyle Boening 05/17/2015

## 2015-05-17 NOTE — Consult Note (Signed)
WOC ostomy consult note Stoma type/location: Colostomy site marking.  Surgery is tomorrow.  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  Educational materials are given.  Discussed surgery and need for colostomy placement.  Advised that [place] team would follow up and teach her self care post operatively.  Abdomen is assessed sitting, standing and lying.  Site in LLQ is marked within rectus muscle, 2 cm left and 2 cm below umbilicus.  Patient is pleased with this site and feels she can manage her self care with ongoing support and teaching.  [place] team will follow.   Domenic Moras RN BSN [place] Pager [phone]

## 2015-05-17 NOTE — Telephone Encounter (Signed)
Pt currently in hospital for SBO with pending surgery. Dr Edwyna Shell was consulted. No RN chemo f/u done.

## 2015-05-18 ENCOUNTER — Encounter (HOSPITAL_COMMUNITY)
Admission: EM | Disposition: A | Discharge status: Home Health | Payer: Self-pay | Source: Home / Self Care | Attending: Internal Medicine

## 2015-05-18 ENCOUNTER — Inpatient Hospital Stay (HOSPITAL_COMMUNITY): Payer: Commercial Managed Care - HMO | Admitting: Certified Registered Nurse Anesthetist

## 2015-05-18 ENCOUNTER — Encounter (HOSPITAL_COMMUNITY): Payer: Self-pay | Admitting: Certified Registered Nurse Anesthetist

## 2015-05-18 DIAGNOSIS — N390 Urinary tract infection, site not specified: Secondary | ICD-10-CM

## 2015-05-18 DIAGNOSIS — K5669 Other intestinal obstruction: Secondary | ICD-10-CM

## 2015-05-18 HISTORY — PX: LAPAROSCOPIC DIVERTED COLOSTOMY: SHX5892

## 2015-05-18 LAB — CBC WITH DIFFERENTIAL/PLATELET
Basophils Absolute: 0 10*3/uL (ref 0.0–0.1)
Basophils Relative: 0 %
EOS PCT: 2 %
Eosinophils Absolute: 0.1 10*3/uL (ref 0.0–0.7)
HCT: 38.9 % (ref 36.0–46.0)
Hemoglobin: 13 g/dL (ref 12.0–15.0)
LYMPHS ABS: 0.8 10*3/uL (ref 0.7–4.0)
LYMPHS PCT: 18 %
MCH: 27.5 pg (ref 26.0–34.0)
MCHC: 33.4 g/dL (ref 30.0–36.0)
MCV: 82.4 fL (ref 78.0–100.0)
MONO ABS: 0.1 10*3/uL (ref 0.1–1.0)
MONOS PCT: 2 %
Neutro Abs: 3.5 10*3/uL (ref 1.7–7.7)
Neutrophils Relative %: 78 %
PLATELETS: 126 10*3/uL — AB (ref 150–400)
RBC: 4.72 MIL/uL (ref 3.87–5.11)
RDW: 12.4 % (ref 11.5–15.5)
WBC: 4.5 10*3/uL (ref 4.0–10.5)

## 2015-05-18 LAB — BASIC METABOLIC PANEL
Anion gap: 12 (ref 5–15)
BUN: 11 mg/dL (ref 6–20)
CALCIUM: 9 mg/dL (ref 8.9–10.3)
CO2: 25 mmol/L (ref 22–32)
CREATININE: 0.54 mg/dL (ref 0.44–1.00)
Chloride: 103 mmol/L (ref 101–111)
GFR calc Af Amer: >60 mL/min (ref >60)
Glucose, Bld: 97 mg/dL (ref 65–99)
Potassium: 3.7 mmol/L (ref 3.5–5.1)
SODIUM: 140 mmol/L (ref 135–145)

## 2015-05-18 LAB — ABO/RH: ABO/RH(D): B POS

## 2015-05-18 LAB — PROTIME-INR
INR: 1.08 (ref 0.00–1.49)
Prothrombin Time: 14.2 seconds (ref 11.6–15.2)

## 2015-05-18 LAB — TYPE AND SCREEN
ABO/RH(D): B POS
Antibody Screen: NEGATIVE

## 2015-05-18 LAB — SURGICAL PCR SCREEN
MRSA, PCR: NEGATIVE
STAPHYLOCOCCUS AUREUS: NEGATIVE

## 2015-05-18 LAB — PREALBUMIN: Prealbumin: 19.8 mg/dL (ref 18–38)

## 2015-05-18 LAB — APTT: APTT: 25 s (ref 24–37)

## 2015-05-18 SURGERY — CREATION, COLOSTOMY, DIVERTING, LAPAROSCOPIC
Anesthesia: General | Site: Abdomen

## 2015-05-18 MED ORDER — FENTANYL CITRATE (PF) 250 MCG/5ML IJ SOLN
INTRAMUSCULAR | Status: AC
  Filled 2015-05-18: qty 5

## 2015-05-18 MED ORDER — SUGAMMADEX SODIUM 200 MG/2ML IV SOLN
INTRAVENOUS | Status: DC | PRN
  Administered 2015-05-18: 200 mg via INTRAVENOUS

## 2015-05-18 MED ORDER — PROMETHAZINE HCL 25 MG/ML IJ SOLN
6.2500 mg | INTRAMUSCULAR | Status: DC | PRN
  Administered 2015-05-18: 6.25 mg via INTRAVENOUS

## 2015-05-18 MED ORDER — PHENYLEPHRINE HCL 10 MG/ML IJ SOLN
INTRAMUSCULAR | Status: DC | PRN
  Administered 2015-05-18 (×2): 80 ug via INTRAVENOUS
  Administered 2015-05-18: 100 ug via INTRAVENOUS
  Administered 2015-05-18: 80 ug via INTRAVENOUS

## 2015-05-18 MED ORDER — PROMETHAZINE HCL 25 MG/ML IJ SOLN
INTRAMUSCULAR | Status: AC
  Filled 2015-05-18: qty 1

## 2015-05-18 MED ORDER — MEPERIDINE HCL 50 MG/ML IJ SOLN: 6.2500 mg | INTRAMUSCULAR | Status: DC | PRN

## 2015-05-18 MED ORDER — SUCCINYLCHOLINE CHLORIDE 20 MG/ML IJ SOLN
INTRAMUSCULAR | Status: DC | PRN
  Administered 2015-05-18: 80 mg via INTRAVENOUS

## 2015-05-18 MED ORDER — MIDAZOLAM HCL 2 MG/2ML IJ SOLN
INTRAMUSCULAR | Status: AC
  Filled 2015-05-18: qty 2

## 2015-05-18 MED ORDER — LACTATED RINGERS IR SOLN
Status: DC | PRN
  Administered 2015-05-18: 1000 mL

## 2015-05-18 MED ORDER — HYDROMORPHONE HCL 1 MG/ML IJ SOLN
0.2500 mg | INTRAMUSCULAR | Status: DC | PRN
  Administered 2015-05-18 (×4): 0.5 mg via INTRAVENOUS

## 2015-05-18 MED ORDER — HYDROMORPHONE HCL 1 MG/ML IJ SOLN
INTRAMUSCULAR | Status: AC
  Filled 2015-05-18: qty 1

## 2015-05-18 MED ORDER — MORPHINE SULFATE (PF) 2 MG/ML IV SOLN
2.0000 mg | INTRAVENOUS | Status: DC | PRN
  Administered 2015-05-18: 2 mg via INTRAVENOUS
  Filled 2015-05-18 (×2): qty 1

## 2015-05-18 MED ORDER — CEFOTETAN DISODIUM 2 G IJ SOLR
2.0000 g | INTRAMUSCULAR | Status: DC | PRN
  Administered 2015-05-18: 2 g via INTRAVENOUS

## 2015-05-18 MED ORDER — BUPIVACAINE-EPINEPHRINE 0.25% -1:200000 IJ SOLN
INTRAMUSCULAR | Status: DC | PRN
  Administered 2015-05-18: 5 mL

## 2015-05-18 MED ORDER — 0.9 % SODIUM CHLORIDE (POUR BTL) OPTIME
TOPICAL | Status: DC | PRN
  Administered 2015-05-18: 2000 mL

## 2015-05-18 MED ORDER — LIDOCAINE HCL (CARDIAC) 20 MG/ML IV SOLN
INTRAVENOUS | Status: DC | PRN
  Administered 2015-05-18: 50 mg via INTRAVENOUS

## 2015-05-18 MED ORDER — PROPOFOL 10 MG/ML IV BOLUS
INTRAVENOUS | Status: DC | PRN
  Administered 2015-05-18: 140 mg via INTRAVENOUS

## 2015-05-18 MED ORDER — MIDAZOLAM HCL 5 MG/5ML IJ SOLN
INTRAMUSCULAR | Status: DC | PRN
  Administered 2015-05-18: 2 mg via INTRAVENOUS

## 2015-05-18 MED ORDER — BUPIVACAINE-EPINEPHRINE 0.25% -1:200000 IJ SOLN
INTRAMUSCULAR | Status: AC
  Filled 2015-05-18: qty 1

## 2015-05-18 MED ORDER — PROPOFOL 10 MG/ML IV BOLUS
INTRAVENOUS | Status: AC
  Filled 2015-05-18: qty 20

## 2015-05-18 MED ORDER — ROCURONIUM BROMIDE 100 MG/10ML IV SOLN
INTRAVENOUS | Status: DC | PRN
  Administered 2015-05-18: 50 mg via INTRAVENOUS
  Administered 2015-05-18: 10 mg via INTRAVENOUS

## 2015-05-18 MED ORDER — FENTANYL CITRATE (PF) 100 MCG/2ML IJ SOLN
INTRAMUSCULAR | Status: DC | PRN
  Administered 2015-05-18: 50 ug via INTRAVENOUS
  Administered 2015-05-18 (×2): 100 ug via INTRAVENOUS

## 2015-05-18 MED ORDER — TBO-FILGRASTIM 300 MCG/0.5ML ~~LOC~~ SOSY
300.0000 ug | PREFILLED_SYRINGE | Freq: Every day | SUBCUTANEOUS | Status: DC
  Administered 2015-05-18: 300 ug via SUBCUTANEOUS
  Filled 2015-05-18 (×2): qty 0.5

## 2015-05-18 MED ORDER — CEFOTETAN DISODIUM-DEXTROSE 2-2.08 GM-% IV SOLR
INTRAVENOUS | Status: AC
  Filled 2015-05-18: qty 50

## 2015-05-18 MED ORDER — LACTATED RINGERS IV SOLN
INTRAVENOUS | Status: DC | PRN
  Administered 2015-05-18 (×2): via INTRAVENOUS

## 2015-05-18 SURGICAL SUPPLY — 62 items
APPLIER CLIP 5 13 M/L LIGAMAX5 (MISCELLANEOUS)
APR CLP MED LRG 5 ANG JAW (MISCELLANEOUS)
BLADE EXTENDED COATED 6.5IN (ELECTRODE) ×1 IMPLANT
BLADE HEX COATED 2.75 (ELECTRODE) ×2 IMPLANT
CABLE HIGH FREQUENCY MONO STRZ (ELECTRODE) ×3 IMPLANT
CATH URET ROBINSON 24FR STRL (CATHETERS) ×2 IMPLANT
CELLS DAT CNTRL 66122 CELL SVR (MISCELLANEOUS) IMPLANT
CLIP APPLIE 5 13 M/L LIGAMAX5 (MISCELLANEOUS) ×1 IMPLANT
COVER SURGICAL LIGHT HANDLE (MISCELLANEOUS) ×3 IMPLANT
DECANTER SPIKE VIAL GLASS SM (MISCELLANEOUS) ×3 IMPLANT
DRAIN CHANNEL 19F RND (DRAIN) IMPLANT
DRAPE LAPAROSCOPIC ABDOMINAL (DRAPES) ×3 IMPLANT
DRAPE UTILITY 15X26 (DRAPE) ×6 IMPLANT
ELECT REM PT RETURN 9FT ADLT (ELECTROSURGICAL) ×3
ELECTRODE REM PT RTRN 9FT ADLT (ELECTROSURGICAL) ×1 IMPLANT
EVACUATOR SILICONE 100CC (DRAIN) ×3 IMPLANT
GAUZE SPONGE 4X4 12PLY STRL (GAUZE/BANDAGES/DRESSINGS) ×3 IMPLANT
GLOVE BIO SURGEON STRL SZ 6.5 (GLOVE) ×6 IMPLANT
GLOVE BIO SURGEONS STRL SZ 6.5 (GLOVE) ×3
GLOVE BIOGEL PI IND STRL 7.0 (GLOVE) ×2 IMPLANT
GLOVE BIOGEL PI INDICATOR 7.0 (GLOVE) ×8
GOWN L4 XXLG W/PAP TWL (GOWN DISPOSABLE) ×6 IMPLANT
LEGGING LITHOTOMY PAIR STRL (DRAPES) ×3 IMPLANT
LIGASURE IMPACT 36 18CM CVD LR (INSTRUMENTS) IMPLANT
NS IRRIG 1000ML POUR BTL (IV SOLUTION) ×5 IMPLANT
PACK COLON (CUSTOM PROCEDURE TRAY) ×3 IMPLANT
PAD POSITIONING PINK XL (MISCELLANEOUS) IMPLANT
POSITIONER SURGICAL ARM (MISCELLANEOUS) IMPLANT
RETRACTOR WND ALEXIS 18 MED (MISCELLANEOUS) IMPLANT
RTRCTR WOUND ALEXIS 18CM MED (MISCELLANEOUS)
SCISSORS LAP 5X35 DISP (ENDOMECHANICALS) ×3 IMPLANT
SEALER TISSUE G2 CVD JAW 35 (ENDOMECHANICALS) IMPLANT
SEALER TISSUE G2 CVD JAW 45CM (ENDOMECHANICALS) ×2
SET IRRIG TUBING LAPAROSCOPIC (IRRIGATION / IRRIGATOR) ×3 IMPLANT
SLEEVE XCEL OPT CAN 5 100 (ENDOMECHANICALS) ×6 IMPLANT
SOLUTION ANTI FOG 6CC (MISCELLANEOUS) ×3 IMPLANT
STAPLER VISISTAT 35W (STAPLE) ×1 IMPLANT
SUT ETHILON 2 0 PS N (SUTURE) ×3 IMPLANT
SUT NOVA NAB DX-16 0-1 5-0 T12 (SUTURE) ×2 IMPLANT
SUT PDS AB 1 CTX 36 (SUTURE) ×2 IMPLANT
SUT PDS AB 1 TP1 96 (SUTURE) IMPLANT
SUT PROLENE 2 0 KS (SUTURE) IMPLANT
SUT SILK 2 0 (SUTURE) ×3
SUT SILK 2 0 SH CR/8 (SUTURE) ×4 IMPLANT
SUT SILK 2-0 18XBRD TIE 12 (SUTURE) ×1 IMPLANT
SUT SILK 3 0 (SUTURE) ×3
SUT SILK 3 0 SH CR/8 (SUTURE) ×3 IMPLANT
SUT SILK 3-0 18XBRD TIE 12 (SUTURE) ×1 IMPLANT
SUT VIC AB 2-0 SH 18 (SUTURE) ×4 IMPLANT
SUT VIC AB 4-0 P2 18 (SUTURE) ×2 IMPLANT
SUT VIC AB 4-0 PS2 18 (SUTURE) ×2 IMPLANT
SUT VICRYL 0 UR6 27IN ABS (SUTURE) ×2 IMPLANT
SUT VICRYL 2 0 18  UND BR (SUTURE)
SUT VICRYL 2 0 18 UND BR (SUTURE) IMPLANT
SYS LAPSCP GELPORT 120MM (MISCELLANEOUS)
SYSTEM LAPSCP GELPORT 120MM (MISCELLANEOUS) IMPLANT
TAPE CLOTH 4X10 WHT NS (GAUZE/BANDAGES/DRESSINGS) IMPLANT
TRAY FOLEY W/METER SILVER 14FR (SET/KITS/TRAYS/PACK) ×3 IMPLANT
TRAY FOLEY W/METER SILVER 16FR (SET/KITS/TRAYS/PACK) ×1 IMPLANT
TROCAR BLADELESS OPT 5 100 (ENDOMECHANICALS) ×3 IMPLANT
TROCAR XCEL BLUNT TIP 100MML (ENDOMECHANICALS) ×3 IMPLANT
TUBING INSUF HEATED (TUBING) ×3 IMPLANT

## 2015-05-18 NOTE — Anesthesia Procedure Notes (Signed)
Procedure Name: Intubation Performed by: Gean Maidens Pre-anesthesia Checklist: Patient identified, Emergency Drugs available, Suction available, Patient being monitored and Timeout performed Patient Re-evaluated:Patient Re-evaluated prior to inductionOxygen Delivery Method: Circle system utilized Preoxygenation: Pre-oxygenation with 100% oxygen (RSI) Intubation Type: IV induction Laryngoscope Size: Mac and 3 Grade View: Grade I Tube type: Oral Number of attempts: 1 Airway Equipment and Method: Stylet Placement Confirmation: ETT inserted through vocal cords under direct vision,  positive ETCO2,  CO2 detector and breath sounds checked- equal and bilateral Secured at: 21 cm Tube secured with: Tape Dental Injury: Teeth and Oropharynx as per pre-operative assessment

## 2015-05-18 NOTE — Progress Notes (Signed)
  Subjective: No complaints, waiting on surgery.  No questions.  Objective: Vital signs in last 24 hours: Temp:  [98.2 F (36.8 C)-98.7 F (37.1 C)] 98.4 F (36.9 C) (03/15 0544) Pulse Rate:  [88-92] 91 (03/15 0544) Resp:  [18] 18 (03/15 0544) BP: (127-135)/(60-68) 128/66 mmHg (03/15 0544) SpO2:  [93 %-97 %] 93 % (03/15 0544) Last BM Date: 05/09/15 NPO Voided x 12 Afebrile, VSS Labs OK Intake/Output from previous day: 03/14 0701 - 03/15 0700 In: 1767.5 [I.V.:1767.5] Out: -  Intake/Output this shift:    General appearance: alert, cooperative and no distress Resp: clear to auscultation bilaterally GI: soft, a little distended, but not having pain.  Lab Results:   Recent Labs  06/05/15 0413 05/18/15 0454  WBC 6.0 4.5  HGB 13.8 13.0  HCT 42.0 38.9  PLT 191 126*    BMET  Recent Labs  2015/06/05 0413 05/18/15 0454  NA 140 140  K 4.5 3.7  CL 103 103  CO2 28 25  GLUCOSE 115* 97  BUN 11 11  CREATININE 0.68 0.54  CALCIUM 8.8* 9.0   PT/INR  Recent Labs  05/18/15 0454  LABPROT 14.2  INR 1.08     Recent Labs Lab 05/15/15 2109  AST 35  ALT 36  ALKPHOS 96  BILITOT 1.0  PROT 7.0  ALBUMIN 3.8     Lipase     Component Value Date/Time   LIPASE 37 04/26/2015 1705     Studies/Results: Dg Abd 2 Views  06-05-15  CLINICAL DATA:  No bowel movement for 1 week EXAM: ABDOMEN - 2 VIEW COMPARISON:  05/15/2015 FINDINGS: Scattered large and small bowel gas is noted. Fecal material is noted throughout the colon without definitive obstructive change. This is consistent with constipation and may be related to the patient's underlying cervical mass and apparent narrowing of the colon. Degenerative changes of lumbar spine are seen. No other focal abnormality is noted. IMPRESSION: Increased fecal burden within the colon likely related to degree of constipation secondary to the known pelvic mass. Electronically Signed   By: Inez Catalina M.D.   On: 2015/06/05 10:26     Medications: . cefTRIAXone (ROCEPHIN)  IV  1 g Intravenous Q24H  . enoxaparin (LOVENOX) injection  40 mg Subcutaneous Daily  . ondansetron (ZOFRAN) IV  4 mg Intravenous Once  . pantoprazole (PROTONIX) IV  40 mg Intravenous Daily  . Tbo-filgastrim (GRANIX) SQ  300 mcg Subcutaneous Daily    Assessment/Plan Sigmoid/rectal obstruction Cervical cancer with metastasis to the lungs, liver and possible to the rectum/sigmoid colon that presented with constipation. Hx of GERD Remote history of Migraines UTI:  Antibiotics: Day 3 Rocephin DVT: SCD/i will add Lovenox    Plan:  Diverting ostomy today.  Hold Lovenox today.    LOS: 3 days    Katherine Boone 05/18/2015

## 2015-05-18 NOTE — Anesthesia Preprocedure Evaluation (Signed)
Anesthesia Evaluation  Patient identified by MRN, date of birth, ID band Patient awake    Reviewed: Allergy & Precautions, NPO status , Patient's Chart, lab work & pertinent test results  Airway Mallampati: II  TM Distance: >3 FB Neck ROM: Full    Dental no notable dental hx.    Pulmonary neg pulmonary ROS,    Pulmonary exam normal breath sounds clear to auscultation       Cardiovascular negative cardio ROS Normal cardiovascular exam Rhythm:Regular Rate:Normal     Neuro/Psych  Headaches, negative psych ROS   GI/Hepatic Neg liver ROS, GERD  ,  Endo/Other  negative endocrine ROS  Renal/GU Renal disease  negative genitourinary   Musculoskeletal negative musculoskeletal ROS (+)   Abdominal   Peds  Hematology negative hematology ROS (+)   Anesthesia Other Findings   Reproductive/Obstetrics negative OB ROS                             Anesthesia Physical Anesthesia Plan  ASA: III  Anesthesia Plan: General   Post-op Pain Management:    Induction: Intravenous, Rapid sequence and Cricoid pressure planned  Airway Management Planned: Oral ETT  Additional Equipment:   Intra-op Plan:   Post-operative Plan: Extubation in OR  Informed Consent: I have reviewed the patients History and Physical, chart, labs and discussed the procedure including the risks, benefits and alternatives for the proposed anesthesia with the patient or authorized representative who has indicated his/her understanding and acceptance.   Dental advisory given  Plan Discussed with: CRNA  Anesthesia Plan Comments:         Anesthesia Quick Evaluation

## 2015-05-18 NOTE — Op Note (Signed)
05/15/2015 - 05/18/2015  12:17 PM  PATIENT:  Katherine Boone  70 y.o. female  Patient Care Team: [name], MD as PCP - General (Family Medicine)  PRE-OPERATIVE DIAGNOSIS:  METASTATIC CERVICAL CANCER WITH OBSTRUCTION  POST-OPERATIVE DIAGNOSIS:  METASTATIC CERVICAL CANCER WITH OBSTRUCTION  PROCEDURE:  LAPAROSCOPIC DIVERTING SIGMOID LOOP COLOSTOMY    Surgeon(s): Leighton Ruff, MD  ASSISTANT: none   ANESTHESIA:   local and general  EBL: 80ml Total I/O In: 1300 [I.V.:1300] Out: 175 [Urine:150; Blood:25]  DRAINS: none   SPECIMEN:  No Specimen  DISPOSITION OF SPECIMEN:  N/A  COUNTS:  YES  PLAN OF CARE: pt already admitted  PATIENT DISPOSITION:  PACU - hemodynamically stable.  INDICATION: 70 y.o. F with metastatic cervical cancer and pelvic obstruction.   OR FINDINGS: Carcinomatosis, mostly confined to pelvis (see pictures below)    DESCRIPTION: the patient was identified in the preoperative holding area and taken to the OR where they were laid supine on the operating room table.  General anesthesia was induced without difficulty. SCDs were also noted to be in place prior to the initiation of anesthesia.  The patient was then prepped and draped in the usual sterile fashion.   A surgical timeout was performed indicating the correct patient, procedure, positioning and need for preoperative antibiotics.   I began by making a supraumbilical incision using a 15 blade scalpel. Dissection was carried down bluntly through the fascia. The fascia was elevated with Kocher clamps and then divided with a #15 scalpel. The peritoneum was entered bluntly. A pursestring 0 Vicryl suture was placed. A Hassan port was placed through this and the abdomen was insufflated to approximately 15 mmHg.  I placed 25 mm ports in the patient's right upper and lower quadrants. I began with inspection of all 4 quadrants of the abdomen. There was minimal disease in the upper abdomen, although there were a  couple nodules studding the peritoneum above the liver. The small bowel had studding of tumor as well. The colon was noted to be intimately involved with the pelvic tumor. I began to mobilize the sigmoid colon off of the left pelvic sidewall using sharp dissection. I divided a metastatic nodule to do this. I then divided the white line of Toldt up towards the splenic flexure to allow for mobilization medially. Once the colon was able to be mobilized to the previously marked ostomy site I lightly made a defect in the mesentery and passed a red rubber catheter through this.  I then made an ostomy site at the previously marked spot on the patient's abdomen. A cruciate incision was made in the fascia. The rectus muscle was split and the peritoneum was entered without difficulty. The loop was brought up out of the abdomen. It did not reach as well as isotope and I reinsufflated the abdomen and mobilized approximately a bit more. The abdomen was then desufflated and the colon was able to be mobilized out as a loop. I confirmed no twisting of the mesentery internally. There remaining ports were removed and the abdomen was desufflated. The fascial was closed with the pursestring suture. A 2-0 Vicryl suture was used to close the subcutaneous tissue and 4-0 Vicryl sutures were used to close the skin of all 3 port sites. Sterile dressings were applied. The ostomy was then matured in standard [place] fashion. An ostomy appliance was applied. The patient tolerated the procedure well and sent to the postanesthesia care unit in stable condition. All counts were correct per operating room  staff.

## 2015-05-18 NOTE — Progress Notes (Signed)
[facility] PROGRESS NOTE  AMIR WITZ [id number] DOB: 1945-09-02 DOA: 05/15/2015 PCP: [name], MD  Brief history of admission and interim summary 70 y.o. female with recently diagnosed (04/26/2015) Metastatic Cervical Cancer S/P first Chemo Rx 4 days ago who presents to the ED with complaints of Constipation x 6 days. She has had Nausea mainly and had some but denies any Vomiting. She has had ABD distension and Bloating. She took Miralax without Relief. She denies any Fevers or Chills. A Ct Scan of the ABD/Pelvis revealed widely Metastatic Cervical Cancer unchanged from 04/26/2015.   Patient currently remains nothing by mouth and has plans for diverting colostomy on 05/18/15.  Assessment/Plan: 1-Nausea/abd pain and constipation: secondary to SBO/outlet obstruction -A CT scan of abdomen and pelvis performed on 05/15/2015 showed widely metastatic cervical carcinoma with bulky tumor narrowing and invading rectum/distal sigmoid with progressive proximal stool retention. -Gen. surgery was consulted -On 05/18/2015 she underwent laparoscopic diverging sigmoid loop colostomy, procedure performed by Dr. Marcello Moores -She tolerated procedure well there were no immediate complications -She remains nothing by mouth, will follow surgery's recommendations -Continue maintenance fluids with normal saline run at 75 mL/hour  2-Cervical cancer with metastasis and with narrowing/invasion distal rectum/sigmoid colon: -oncology service consulted -actively receiving chemotherapy -As mentioned above patient undergoing laparoscopic diverging sigmoid loop colostomy on 05/18/2015  3-GERD -will continue IV PPI for now  4-UTI: -empirically started on rocephin, plan to transition to orals in the next 24-48 hours -reported frequency and dysuria prior to admission -Patient currently without any dysuria or complaints. She remained afebrile and with normal WBCs. -Urine culture showing  multiple species  5-DVT: -SCD's for now  Code Status: Full Family Communication: no family at bedside  Disposition Plan: Patient undergoing diverging colostomy on 05/18/2015, plan to follow surgery's recommendations on advancement of diet   Consultants:  Oncology service (Dr. Marko Plume)   [facility]   Procedures:  CT scan shows: Widely metastatic cervical carcinoma with unchanged appearance compared to 04/26/2015. Bulky tumor narrows and likely invades the rectum/distal sigmoid with progressive proximal stool retention. She has hepatic metastasis, multiple intraabdominal nodes, and retroperitoneal adenopathy, on the current CT.   Planned diverting colostomy on 05/18/15  Antibiotics:  Rocephin 3/12  HPI/Subjective: She is being seen post procedure, has no complaints reports soreness over her abdominal region  Objective: Filed Vitals:   05/18/15 1400 05/18/15 1420  BP: 115/78 124/83  Pulse: 97 97  Temp: 97.9 F (36.6 C) 98 F (36.7 C)  Resp: 12 12    Intake/Output Summary (Last 24 hours) at 05/18/15 1534 Last data filed at 05/18/15 1400  Gross per 24 hour  Intake 2767.5 ml  Output    275 ml  Net 2492.5 ml   Filed Weights   05/15/15 1857 05/16/15 0057  Weight: 76.658 kg (169 lb) 74.844 kg (165 lb)    Exam:   General:  Patient is seen post procedure, she is awake and alert  Cardiovascular: S1 and S2, no rubs or gallops  Respiratory: good air movement, no wheezing  Abdomen: Status post colostomy, bag with serosanguineous fluid, there is generalized abdominal pain to palpation no peritoneal signs evident  Musculoskeletal: no edema, no cyanosis   Data Reviewed: Basic Metabolic Panel:  Recent Labs Lab 05/15/15 2109 05/16/15 0413 05/18/15 0454  NA 141 140 140  K 4.7 4.5 3.7  CL 104 103 103  CO2 28 28 25   GLUCOSE 114* 115* 97  BUN 14 11 11   CREATININE 0.69 0.68 0.54  CALCIUM 8.8*  8.8* 9.0   Liver Function Tests:  Recent Labs Lab 05/15/15 2109  AST  35  ALT 36  ALKPHOS 96  BILITOT 1.0  PROT 7.0  ALBUMIN 3.8   CBC:  Recent Labs Lab 05/15/15 2109 05/16/15 0413 05/18/15 0454  WBC 7.3 6.0 4.5  NEUTROABS 6.0  --  3.5  HGB 13.8 13.8 13.0  HCT 42.7 42.0 38.9  MCV 87.3 87.1 82.4  PLT 222 191 126*   CBG: No results for input(s): GLUCAP in the last 168 hours.  Recent Results (from the past 240 hour(s))  Culture, Virus     Status: Abnormal   Collection Time: 05/10/15 11:50 AM  Result Value Ref Range Status   Viral Culture: Comment (A)  Final    Comment: Positive for Herpes simplex virus type-2. Typing was confirmed by monoclonal antibody microscopic immunofluorescence.   Urine culture     Status: None   Collection Time: 05/15/15  9:23 PM  Result Value Ref Range Status   Specimen Description URINE, CLEAN CATCH  Final   Special Requests NONE  Final   Culture   Final    MULTIPLE SPECIES PRESENT, SUGGEST RECOLLECTION Performed at [facility]    Report Status 05/17/2015 FINAL  Final  Surgical pcr screen     Status: None   Collection Time: 05/18/15  5:09 AM  Result Value Ref Range Status   MRSA, PCR NEGATIVE NEGATIVE Final   Staphylococcus aureus NEGATIVE NEGATIVE Final    Comment:        The Xpert SA Assay (FDA approved for NASAL specimens in patients over 50 years of age), is one component of a comprehensive surveillance program.  Test performance has been validated by [facility] for patients greater than or equal to 60 year old. It is not intended to diagnose infection nor to guide or monitor treatment.      Studies: No results found.  Scheduled Meds: . cefTRIAXone (ROCEPHIN)  IV  1 g Intravenous Q24H  . HYDROmorphone      . HYDROmorphone      . ondansetron (ZOFRAN) IV  4 mg Intravenous Once  . pantoprazole (PROTONIX) IV  40 mg Intravenous Daily  . promethazine      . Tbo-filgastrim (GRANIX) SQ  300 mcg Subcutaneous q1800   Continuous Infusions: . sodium chloride 1,000 mL (05/17/15 0213)     Principal Problem:   Constipation by outlet obstruction Active Problems:   International Federation of Gynecology and Obstetrics (FIGO) stage IVB malignant neoplasm of cervix ([place])   Urinary tract infection, site not specified   Small bowel obstruction ([place])    Time spent: 25 minutes    Kelvin Cellar  [facility] Pager [phone]. If 7PM-7AM, please contact night-coverage at [email or web address], password [facility] 05/18/2015, 3:34 PM  LOS: 3 days

## 2015-05-18 NOTE — Transfer of Care (Signed)
Immediate Anesthesia Transfer of Care Note  Patient: Katherine Boone  Procedure(s) Performed: Procedure(s): LAPAROSCOPIC DIVERTED LOOP COLOSTOMY  (N/A)  Patient Location: PACU  Anesthesia Type:General  Level of Consciousness: awake, alert  and oriented  Airway & Oxygen Therapy: Patient Spontanous Breathing and Patient connected to face mask oxygen  Post-op Assessment: Report given to RN and Post -op Vital signs reviewed and stable  Post vital signs: Reviewed and stable  Last Vitals:  Filed Vitals:   05/17/15 2144 05/18/15 0544  BP: 132/65 128/66  Pulse: 88 91  Temp: 37.1 C 36.9 C  Resp: 18 18    Complications: No apparent anesthesia complications

## 2015-05-18 NOTE — Care Management Important Message (Signed)
Important Message  Patient Details  Name: Katherine Boone MRN: [id number] Date of Birth: 1945-05-26   Medicare Important Message Given:  Yes    Camillo Flaming 05/18/2015, 8:59 AMImportant Message  Patient Details  Name: Katherine Boone MRN: [id number] Date of Birth: [birth year]   Medicare Important Message Given:  Yes    Camillo Flaming 05/18/2015, 8:59 AM

## 2015-05-18 NOTE — Anesthesia Postprocedure Evaluation (Signed)
Anesthesia Post Note  Patient: Katherine Boone  Procedure(s) Performed: Procedure(s) (LRB): LAPAROSCOPIC DIVERTED LOOP COLOSTOMY  (N/A)  Patient location during evaluation: PACU Anesthesia Type: General Level of consciousness: sedated and patient cooperative Pain management: pain level controlled Vital Signs Assessment: post-procedure vital signs reviewed and stable Respiratory status: spontaneous breathing Cardiovascular status: stable Anesthetic complications: no    Last Vitals:  Filed Vitals:   05/18/15 1420 05/18/15 1615  BP: 124/83 118/60  Pulse: 97 90  Temp: 36.7 C 36.9 C  Resp: 12 12    Last Pain:  Filed Vitals:   05/18/15 1618  PainSc: 0-No pain                 Nolon Nations

## 2015-05-19 ENCOUNTER — Ambulatory Visit: Payer: Commercial Managed Care - HMO | Admitting: Oncology

## 2015-05-19 ENCOUNTER — Other Ambulatory Visit: Payer: Commercial Managed Care - HMO

## 2015-05-19 DIAGNOSIS — C7800 Secondary malignant neoplasm of unspecified lung: Secondary | ICD-10-CM

## 2015-05-19 DIAGNOSIS — D701 Agranulocytosis secondary to cancer chemotherapy: Secondary | ICD-10-CM

## 2015-05-19 DIAGNOSIS — K5902 Outlet dysfunction constipation: Secondary | ICD-10-CM

## 2015-05-19 DIAGNOSIS — B009 Herpesviral infection, unspecified: Secondary | ICD-10-CM

## 2015-05-19 LAB — CBC WITH DIFFERENTIAL/PLATELET
BASOS ABS: 0 10*3/uL (ref 0.0–0.1)
Basophils Relative: 1 %
EOS ABS: 0.1 10*3/uL (ref 0.0–0.7)
Eosinophils Relative: 7 %
HCT: 36 % (ref 36.0–46.0)
HEMOGLOBIN: 12 g/dL (ref 12.0–15.0)
LYMPHS PCT: 47 %
Lymphs Abs: 1 10*3/uL (ref 0.7–4.0)
MCH: 28.5 pg (ref 26.0–34.0)
MCHC: 33.3 g/dL (ref 30.0–36.0)
MCV: 85.5 fL (ref 78.0–100.0)
Monocytes Absolute: 0.3 10*3/uL (ref 0.1–1.0)
Monocytes Relative: 14 %
NEUTROS PCT: 31 %
Neutro Abs: 0.6 10*3/uL — ABNORMAL LOW (ref 1.7–7.7)
Platelets: 121 10*3/uL — ABNORMAL LOW (ref 150–400)
RBC: 4.21 MIL/uL (ref 3.87–5.11)
RDW: 12.7 % (ref 11.5–15.5)
WBC: 2 10*3/uL — AB (ref 4.0–10.5)

## 2015-05-19 LAB — BASIC METABOLIC PANEL
ANION GAP: 10 (ref 5–15)
BUN: 8 mg/dL (ref 6–20)
CHLORIDE: 100 mmol/L — AB (ref 101–111)
CO2: 26 mmol/L (ref 22–32)
CREATININE: 0.64 mg/dL (ref 0.44–1.00)
Calcium: 8.7 mg/dL — ABNORMAL LOW (ref 8.9–10.3)
GFR calc non Af Amer: >60 mL/min (ref >60)
Glucose, Bld: 91 mg/dL (ref 65–99)
Potassium: 3.6 mmol/L (ref 3.5–5.1)
SODIUM: 136 mmol/L (ref 135–145)

## 2015-05-19 MED ORDER — ENOXAPARIN SODIUM 40 MG/0.4ML ~~LOC~~ SOLN
40.0000 mg | SUBCUTANEOUS | Status: DC
  Filled 2015-05-19: qty 0.4

## 2015-05-19 MED ORDER — TBO-FILGRASTIM 480 MCG/0.8ML ~~LOC~~ SOSY
480.0000 ug | PREFILLED_SYRINGE | Freq: Every day | SUBCUTANEOUS | Status: DC
  Administered 2015-05-19: 480 ug via SUBCUTANEOUS
  Administered 2015-05-20: 300 ug via SUBCUTANEOUS
  Filled 2015-05-19 (×5): qty 0.8

## 2015-05-19 MED ORDER — ENOXAPARIN SODIUM 40 MG/0.4ML ~~LOC~~ SOLN
40.0000 mg | SUBCUTANEOUS | Status: DC
  Administered 2015-05-19 – 2015-05-22 (×4): 40 mg via SUBCUTANEOUS
  Filled 2015-05-19 (×5): qty 0.4

## 2015-05-19 MED ORDER — ACETAMINOPHEN 10 MG/ML IV SOLN
1000.0000 mg | Freq: Four times a day (QID) | INTRAVENOUS | Status: AC
  Administered 2015-05-19 – 2015-05-20 (×4): 1000 mg via INTRAVENOUS
  Filled 2015-05-19 (×7): qty 100

## 2015-05-19 NOTE — Progress Notes (Signed)
[facility] PROGRESS NOTE  [name] [id number] DOB: June 03, 1945 DOA: 05/15/2015 PCP: [name], MD  Brief history of admission and interim summary 70 y.o. female with recently diagnosed (04/26/2015) Metastatic Cervical Cancer S/P first Chemo Rx 4 days ago who presents to the ED with complaints of Constipation x 6 days. She has had Nausea mainly and had some but denies any Vomiting. She has had ABD distension and Bloating. She took Miralax without Relief. She denies any Fevers or Chills. A Ct Scan of the ABD/Pelvis revealed widely Metastatic Cervical Cancer unchanged from 04/26/2015.   Patient currently remains nothing by mouth and has plans for diverting colostomy on 05/18/15.  Assessment/Plan: 1-Nausea/abd pain and constipation: secondary to SBO/outlet obstruction -A CT scan of abdomen and pelvis performed on 05/15/2015 showed widely metastatic cervical carcinoma with bulky tumor narrowing and invading rectum/distal sigmoid with progressive proximal stool retention. -Gen. surgery was consulted -On 05/18/2015 she underwent laparoscopic diverging sigmoid loop colostomy, procedure performed by Dr. Marcello Moores -She tolerated procedure well there were no immediate complications -Continue maintenance fluids with normal saline run at 75 mL/hour -Patient started on sips of clears today. -Meanwhile ostomy site looks clean, however, no output thus far  2-Cervical cancer with metastasis and with narrowing/invasion distal rectum/sigmoid colon: -oncology service consulted -actively receiving chemotherapy -As mentioned above patient undergoing laparoscopic diverging sigmoid loop colostomy on 05/18/2015  3-GERD -will continue IV PPI for now  4-UTI: -empirically started on rocephin, plan to transition to orals in the next 24-48 hours -reported frequency and dysuria prior to admission -Patient currently without any dysuria or complaints. She remained afebrile and with  normal WBCs. -Urine culture showing multiple species  5.  Neutropenia. -A.m. lab work showing white count of 2000 -Neutropenia likely secondary to chemotherapy -Medical oncology following, on Granix -Follow CBC  6-DVT: -SCD's   Code Status: Full Family Communication: multiple family members at bedside updated on patient's condition Disposition Plan: Patient undergoing diverging colostomy on 05/18/2015, plan to follow surgery's recommendations on advancement of diet   Consultants:  Oncology service (Dr. Marko Plume)   CCS   Procedures:  CT scan shows: Widely metastatic cervical carcinoma with unchanged appearance compared to 04/26/2015. Bulky tumor narrows and likely invades the rectum/distal sigmoid with progressive proximal stool retention. She has hepatic metastasis, multiple intraabdominal nodes, and retroperitoneal adenopathy, on the current CT.   Planned diverting colostomy on 05/18/15  Antibiotics:  Rocephin 3/12  HPI/Subjective: She is being seen post procedure, has no complaints reports soreness over her abdominal region  Objective: Filed Vitals:   05/19/15 0939 05/19/15 1401  BP: 129/65 123/64  Pulse: 91 91  Temp: 98.7 F (37.1 C) 98.5 F (36.9 C)  Resp: 16 16    Intake/Output Summary (Last 24 hours) at 05/19/15 1708 Last data filed at 05/19/15 1401  Gross per 24 hour  Intake   1050 ml  Output   2050 ml  Net  -1000 ml   Filed Weights   05/15/15 1857 05/16/15 0057  Weight: 76.658 kg (169 lb) 74.844 kg (165 lb)    Exam:   General:  Patient is seen post procedure, she is awake and alert  Cardiovascular: S1 and S2, no rubs or gallops  Respiratory: good air movement, no wheezing  Abdomen: Status post colostomy, bag with serosanguineous fluid, there is generalized abdominal pain to palpation no peritoneal signs evident  Musculoskeletal: no edema, no cyanosis   Data Reviewed: Basic Metabolic Panel:  Recent Labs Lab 05/15/15 2109  05/16/15 0413 05/18/15 0454  05/19/15 0427  NA 141 140 140 136  K 4.7 4.5 3.7 3.6  CL 104 103 103 100*  CO2 28 28 25 26   GLUCOSE 114* 115* 97 91  BUN 14 11 11 8   CREATININE 0.69 0.68 0.54 0.64  CALCIUM 8.8* 8.8* 9.0 8.7*   Liver Function Tests:  Recent Labs Lab 05/15/15 2109  AST 35  ALT 36  ALKPHOS 96  BILITOT 1.0  PROT 7.0  ALBUMIN 3.8   CBC:  Recent Labs Lab 05/15/15 2109 05/16/15 0413 05/18/15 0454 05/19/15 0427  WBC 7.3 6.0 4.5 2.0*  NEUTROABS 6.0  --  3.5 0.6*  HGB 13.8 13.8 13.0 12.0  HCT 42.7 42.0 38.9 36.0  MCV 87.3 87.1 82.4 85.5  PLT 222 191 126* 121*   CBG: No results for input(s): GLUCAP in the last 168 hours.  Recent Results (from the past 240 hour(s))  Culture, Virus     Status: Abnormal   Collection Time: 05/10/15 11:50 AM  Result Value Ref Range Status   Viral Culture: Comment (A)  Final    Comment: Positive for Herpes simplex virus type-2. Typing was confirmed by monoclonal antibody microscopic immunofluorescence.   Urine culture     Status: None   Collection Time: 05/15/15  9:23 PM  Result Value Ref Range Status   Specimen Description URINE, CLEAN CATCH  Final   Special Requests NONE  Final   Culture   Final    MULTIPLE SPECIES PRESENT, SUGGEST RECOLLECTION Performed at [facility]    Report Status 05/17/2015 FINAL  Final  Surgical pcr screen     Status: None   Collection Time: 05/18/15  5:09 AM  Result Value Ref Range Status   MRSA, PCR NEGATIVE NEGATIVE Final   Staphylococcus aureus NEGATIVE NEGATIVE Final    Comment:        The Xpert SA Assay (FDA approved for NASAL specimens in patients over 33 years of age), is one component of a comprehensive surveillance program.  Test performance has been validated by [facility] for patients greater than or equal to 59 year old. It is not intended to diagnose infection nor to guide or monitor treatment.      Studies: No results found.  Scheduled Meds: .  acetaminophen  1,000 mg Intravenous 4 times per day  . cefTRIAXone (ROCEPHIN)  IV  1 g Intravenous Q24H  . enoxaparin (LOVENOX) injection  40 mg Subcutaneous Q24H  . ondansetron (ZOFRAN) IV  4 mg Intravenous Once  . pantoprazole (PROTONIX) IV  40 mg Intravenous Daily  . Tbo-filgastrim (GRANIX) SQ  480 mcg Subcutaneous Daily   Continuous Infusions: . sodium chloride 75 mL/hr at 05/19/15 0355    Principal Problem:   Constipation by outlet obstruction Active Problems:   International Federation of Gynecology and Obstetrics (FIGO) stage IVB malignant neoplasm of cervix ([place])   Urinary tract infection, site not specified   Small bowel obstruction ([place])    Time spent: 25 minutes    Kelvin Cellar  [facility] Pager [phone]. If 7PM-7AM, please contact night-coverage at [email or web address], password [facility] 05/19/2015, 5:08 PM  LOS: 4 days

## 2015-05-19 NOTE — Progress Notes (Signed)
1 Day Post-Op  Subjective: She is a very tough lady.  She feels pretty blown up and sore, but not really complaining.  I offered her PCA and she declined.  She has some clear serosanguinous stuff in the ostomy bag, but not much more.    Objective: Vital signs in last 24 hours: Temp:  [97.9 F (36.6 C)-99.2 F (37.3 C)] 99.2 F (37.3 C) (03/16 0449) Pulse Rate:  [85-97] 90 (03/16 0449) Resp:  [10-16] 14 (03/16 0449) BP: (106-161)/(57-90) 108/63 mmHg (03/16 0449) SpO2:  [95 %-100 %] 96 % (03/16 0449) Last BM Date: 05/09/15 NPO 1800 urine 50 from ostomy so far TM 99.2 WBC down to 2000/plateletes 121 BMP OK No films Intake/Output from previous day: 03/15 0701 - 03/16 0700 In: 2650 [I.V.:2500; IV Piggyback:150] Out: 1875 [Urine:1800; Stool:50; Blood:25] Intake/Output this shift:    General appearance: alert, cooperative and no distress Resp: clear to auscultation bilaterally GI: soft, still distended, sites look good.  Ostomy with some edema, but looks fine, bridge in place.  BS hypoacitive  Lab Results:   Recent Labs  05/18/15 0454 05/19/15 0427  WBC 4.5 2.0*  HGB 13.0 12.0  HCT 38.9 36.0  PLT 126* 121*    BMET  Recent Labs  05/18/15 0454 05/19/15 0427  NA 140 136  K 3.7 3.6  CL 103 100*  CO2 25 26  GLUCOSE 97 91  BUN 11 8  CREATININE 0.54 0.64  CALCIUM 9.0 8.7*   PT/INR  Recent Labs  05/18/15 0454  LABPROT 14.2  INR 1.08     Recent Labs Lab 05/15/15 2109  AST 35  ALT 36  ALKPHOS 96  BILITOT 1.0  PROT 7.0  ALBUMIN 3.8     Lipase     Component Value Date/Time   LIPASE 37 04/26/2015 1705     Studies/Results: No results found.  Medications: . cefTRIAXone (ROCEPHIN)  IV  1 g Intravenous Q24H  . ondansetron (ZOFRAN) IV  4 mg Intravenous Once  . pantoprazole (PROTONIX) IV  40 mg Intravenous Daily  . Tbo-filgastrim (GRANIX) SQ  300 mcg Subcutaneous q1800   . sodium chloride 75 mL/hr at 05/19/15 0355   Assessment/Plan METASTATIC  CERVICAL CANCER WITH OBSTRUCTION S/p LAPAROSCOPIC DIVERTING SIGMOID LOOP COLOSTOMY, [id number], Dr. Leighton Ruff  Hx of GERD Remote history of Migraines UTI:  Antibiotics: Day 3 Rocephin DVT: SCD/Lovenox not restarted because of platelets  Plan:  We will continue IV's, get her OOB some today.  I told her we would decide about the foley later.  Add some IV tylenol.  WBC and platelets are down, and she is on Granix.  I will give her some ice chips for now.  Will discuss with Dr. Marcello Moores about starting Lovenox with low platelets and declining WBC/platelets.     LOS: 4 days    Katherine Boone 05/19/2015

## 2015-05-19 NOTE — Addendum Note (Signed)
Addendum  created 05/19/15 0802 by Lollie Sails, CRNA   Modules edited: Charges VN

## 2015-05-19 NOTE — Consult Note (Signed)
WOC ostomy consult note Stoma type/location:Left lower quadrant loop colostomy with red rubber catheter rod in place  Stomal assessment/size: Stoma red, moist and swollen Peristomal assessment: Unable to view at this time. Had surgery 05/18/18 and appliance is dry and intact.  Output 50cc serosanguinous liquid  Ostomy pouching: 1pc.  Education provided:Discussed with patient diet, peristoma care, signs and symptoms of impending peristomal skin injury, showering with bag in place, emptying bag, and clothing. Teach back done. Will continue ostomy teaching at appliance change tomorrow 05/20/18  Enrolled patient in [place] program: No To be enrolled

## 2015-05-19 NOTE — Progress Notes (Signed)
MEDICAL ONCOLOGY May 19, 2015, 8:16 AM  Hospital day 5 Post op day 1 diverting sigmoid loop colostomy Antibiotics: Rocephin Chemotherapy: day 8 cycle 1 carboplatin taxol  She is neutropenic today, ANC 0.6.  Continue granix, day 4 today, dose increased. Neutropenic precautions  Discussed with Katherine Boone and unit RN  Subjective: "doing ok" since surgery yesterday for diverting colostomy. No output yet from colostomy, abdomen still feels full but not worse. Belching, no vomiting, is NPO. Feels pain is manageable now, prefers not to use pain meds if she can avoid it but understands those are available and ok to use if needed. Walked in hall after surgery and has been moving in bed/ sitting on side of bed. Denies SOB or cough, using O2 by nasal canula. Mouth dry. Foley still in but she is in agreement with removing this. Feels pressure on bladder likely with pelvic involvement from gyn malignancy. No bleeding.  ONCOLOGIC HISTORY Patient was in her usual very good health until new cramping abdominal pain intermittently for last few months. She had some constipation with this, particularly severe when she saw PCP for this reason on 04-26-15. She has had no vaginal bleeding. She had no bowel obstruction by xray and bowels moved with miralax. CT AP 04-26-15 had 10.3 x 13.8 mixed cystic and solid mass involving uterus and extending posteriorly into cul de sac and into bilateral adnexal regions, sigmoid colon tethered to mass, no ascites, scattered necrotic lymph nodes in mesentery and omentum, no hydroureter, 3 lesions in liver up to 3.4 cm, and numerous bilateral pulmonary nodules in lung bases. CEA was 1.1 and CA 125 was 105; CBC and CMET 04-26-15 were entirely normal. 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 She had CT biopsy of pelvic mass in region of left psoas on 3-1-1 7. Pathology [phone], showed high grade poorly differentiated carcinoma  with immunohistochemical stains suggesting cervical primary, which did not fit clinical picture well. CT chest 05-10-15 showed multiple bilateral pulmonary nodules (>40), 3 liver lesions up to 3.7 cm, left supraclavicular node and left hilar nodes. Chemotherapy was begun with first carboplatin and taxol on 05-12-15.   Objective: Vital signs in last 24 hours: Blood pressure 108/63, pulse 90, temperature 99.2 F (37.3 C), temperature source Oral, resp. rate 14, height 5\' 7"  (1.702 m), weight 165 lb (74.844 kg), SpO2 96 %. Awake, alert, oriented and appropriate. Respirations not labored. Oral mucosa somewhat dry without lesions. Lungs without wheezes or rales. Heart RRR. No central line, peripheral IV sites ok. Abdomen distended, not tight, quiet. Ostomy bag with a little air, stoma pink, no bleeding. LE no edema, cords or tenderness. Moves and repositions in bed without assistance. Foley with amber urine.   Intake/Output from previous day: 03/15 0701 - 03/16 0700 In: 2650 [I.V.:2500; IV Piggyback:150] Out: 1875 [Urine:1800; Stool:50; Blood:25] Intake/Output this shift:      Lab Results:  Recent Labs  05/18/15 0454 05/19/15 0427  WBC 4.5 2.0*  HGB 13.0 12.0  HCT 38.9 36.0  PLT 126* 121*  ANC 0.6, having been 3.5 on 05-18-15   BMET  Recent Labs  05/18/15 0454 05/19/15 0427  NA 140 136  K 3.7 3.6  CL 103 100*  CO2 25 26  GLUCOSE 97 91  BUN 11 8  CREATININE 0.54 0.64  CALCIUM 9.0 8.7*    Studies/Results: No results found.   MEDICATIONS:  Increased granix to 480 mg, will do dose now.  OK  for prophylactic LMW heparin as long as platelets >90-100 K  DISCUSSION Counts dropping from first chemo, given 05-12-15, now neutropenic and likely not at nadir yet. Have increased granix dose, instructed patient and given orders for neutropenic precautions, discussed with unit RN. Recommend removing foley if surgery agrees. If temp >=100.5 Katherine need to have blood and urine cultures and  would broaden antibiotic coverage with addition of vanc. OK for lovenox as long as platelets >= 90-100k and no bleeding.  Encouraged patient to be up in room as tolerates. She understands that pain medication is available if needed.    Assessment/Plan: 1.post op day 1 from diverting sigmoid loop colostomy: no bowel function yet, continuing IVF and NPO. Unfortunately counts are dropping from first chemo now, which may slow recovery. 2. Gyn malignancy with large pelvic mass involving uterus and bilateral adnexal regions, adenopathy, possible liver involvement and CT consistent with numerous bilateral pulmonary mets. Pathology of peritoneal mass is high grade poorly differentiated carcinoma with immunohistochemical pattern suggesting cervical primary. CT chest with multiple bilateral lung mets. Presenting symptoms were constipation, improved initially with miralax; see #1. Chemotherapy with carboplatin taxol begiun 05-12-15 in attempt to improve disease, but Katherine not be curative.  Not candidate for gyn oncology debulking. Intraoperative findings reviewed in Dr Marcello Moores' note.  3.chemotherapy neutropenia: on granix since 3-13, dose increased now. Counts likely Katherine continue to drop over next few days at least. Neutropenic precautions, see above.  4. 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 Needs to resume prophylactic acyclovir when able to take po, or could give IV if any recurrence while NPO 9.flu vaccine 05-06-15 10. Nutrition: hopefully Katherine be able to resume po's in near future when bowel function returns  Please page me if needed between rounds [phone], or med onc on call  Trace Wirick P MD

## 2015-05-20 DIAGNOSIS — T451X5A Adverse effect of antineoplastic and immunosuppressive drugs, initial encounter: Secondary | ICD-10-CM

## 2015-05-20 DIAGNOSIS — D701 Agranulocytosis secondary to cancer chemotherapy: Secondary | ICD-10-CM | POA: Insufficient documentation

## 2015-05-20 LAB — BASIC METABOLIC PANEL
Anion gap: 14 (ref 5–15)
BUN: 6 mg/dL (ref 6–20)
CALCIUM: 8.7 mg/dL — AB (ref 8.9–10.3)
CO2: 21 mmol/L — ABNORMAL LOW (ref 22–32)
CREATININE: 0.45 mg/dL (ref 0.44–1.00)
Chloride: 104 mmol/L (ref 101–111)
GFR calc Af Amer: >60 mL/min (ref >60)
Glucose, Bld: 60 mg/dL — ABNORMAL LOW (ref 65–99)
Potassium: 3.2 mmol/L — ABNORMAL LOW (ref 3.5–5.1)
SODIUM: 139 mmol/L (ref 135–145)

## 2015-05-20 LAB — CBC WITH DIFFERENTIAL/PLATELET
BASOS ABS: 0 10*3/uL (ref 0.0–0.1)
Basophils Relative: 1 %
EOS ABS: 0.1 10*3/uL (ref 0.0–0.7)
Eosinophils Relative: 7 %
HEMATOCRIT: 35.2 % — AB (ref 36.0–46.0)
HEMOGLOBIN: 11.7 g/dL — AB (ref 12.0–15.0)
LYMPHS PCT: 49 %
Lymphs Abs: 1.1 10*3/uL (ref 0.7–4.0)
MCH: 28.5 pg (ref 26.0–34.0)
MCHC: 33.2 g/dL (ref 30.0–36.0)
MCV: 85.6 fL (ref 78.0–100.0)
MONOS PCT: 41 %
Monocytes Absolute: 0.9 10*3/uL (ref 0.1–1.0)
NEUTROS ABS: 0 10*3/uL — AB (ref 1.7–7.7)
NEUTROS PCT: 2 %
PLATELETS: 128 10*3/uL — AB (ref 150–400)
RBC: 4.11 MIL/uL (ref 3.87–5.11)
RDW: 12.6 % (ref 11.5–15.5)
WBC: 2.1 10*3/uL — ABNORMAL LOW (ref 4.0–10.5)

## 2015-05-20 LAB — PREALBUMIN: PREALBUMIN: 12.7 mg/dL — AB (ref 18–38)

## 2015-05-20 MED ORDER — POTASSIUM CHLORIDE CRYS ER 20 MEQ PO TBCR
40.0000 meq | EXTENDED_RELEASE_TABLET | Freq: Once | ORAL | Status: AC
  Administered 2015-05-20: 40 meq via ORAL
  Filled 2015-05-20: qty 2

## 2015-05-20 MED ORDER — KCL IN DEXTROSE-NACL 40-5-0.9 MEQ/L-%-% IV SOLN
INTRAVENOUS | Status: DC
  Administered 2015-05-20: 125 mL/h via INTRAVENOUS
  Administered 2015-05-20: 1000 mL via INTRAVENOUS
  Administered 2015-05-21: 03:00:00 via INTRAVENOUS
  Filled 2015-05-20 (×4): qty 1000

## 2015-05-20 MED ORDER — OXYCODONE HCL 5 MG PO TABS: 5.0000 mg | ORAL_TABLET | ORAL | Status: DC | PRN

## 2015-05-20 MED ORDER — ACETAMINOPHEN 500 MG PO TABS
1000.0000 mg | ORAL_TABLET | Freq: Four times a day (QID) | ORAL | Status: DC
  Administered 2015-05-20 – 2015-05-22 (×7): 1000 mg via ORAL
  Filled 2015-05-20 (×15): qty 2

## 2015-05-20 MED ORDER — POTASSIUM CHLORIDE 2 MEQ/ML IV SOLN
INTRAVENOUS | Status: DC
  Filled 2015-05-20 (×2): qty 1000

## 2015-05-20 MED ORDER — ACETAMINOPHEN 160 MG/5ML PO SOLN
1000.0000 mg | Freq: Four times a day (QID) | ORAL | Status: DC
  Administered 2015-05-20: 1000 mg via ORAL
  Filled 2015-05-20 (×4): qty 40

## 2015-05-20 NOTE — Progress Notes (Signed)
[facility] PROGRESS NOTE  Katherine Boone [id number] DOB: 1946-01-04 DOA: 05/15/2015 PCP: [name], MD  Brief history of admission and interim summary 70 y.o. female with recently diagnosed (04/26/2015) Metastatic Cervical Cancer S/P first Chemo Rx 4 days ago who presents to the ED with complaints of Constipation x 6 days. She has had Nausea mainly and had some but denies any Vomiting. She has had ABD distension and Bloating. She took Miralax without Relief. She denies any Fevers or Chills. A Ct Scan of the ABD/Pelvis revealed widely Metastatic Cervical Cancer unchanged from 04/26/2015.   Patient currently remains nothing by mouth and has plans for diverting colostomy on 05/18/15.  Assessment/Plan: 1-Nausea/abd pain and constipation: secondary to SBO/outlet obstruction -A CT scan of abdomen and pelvis performed on 05/15/2015 showed widely metastatic cervical carcinoma with bulky tumor narrowing and invading rectum/distal sigmoid with progressive proximal stool retention. -Gen. surgery was consulted -On 05/18/2015 she underwent laparoscopic diverging sigmoid loop colostomy, procedure performed by Dr. Marcello Moores -She tolerated procedure well there were no immediate complications -Continue maintenance fluids with normal saline run at 75 mL/hour -Patient started on sips of clears today. -Meanwhile ostomy site looks clean, however, no output thus far -Diet is being advanced to full liquids today. Overall she seems to be doing much better.  2-Cervical cancer with metastasis and with narrowing/invasion distal rectum/sigmoid colon: -oncology service consulted -actively receiving chemotherapy -As mentioned above patient undergoing laparoscopic diverging sigmoid loop colostomy on 05/18/2015 -Seen by Dr Marko Plume   3-GERD -will continue IV PPI for now  4-UTI: -empirically started on rocephin, plan to transition to orals in the next 24-48 hours -reported frequency and  dysuria prior to admission -Patient currently without any dysuria or complaints. She remained afebrile and with normal WBCs. -Urine culture showing multiple species  5.  Neutropenia. -A.m. lab work showing white count of 2000 -Neutropenia likely secondary to chemotherapy -Medical oncology following, on Granix -Thus far she has not spiked any temperatures  6.  Hypokalemia. -a.m. lab work showing potassium of 3.2 -She was given potassium replacement therapy we'll follow a.m. labs.  7-DVT: -SCD's   Code Status: Full Family Communication: multiple family members at bedside updated on patient's condition Disposition Plan: Patient undergoing diverging colostomy on 05/18/2015, plan to follow surgery's recommendations on advancement of diet   Consultants:  Oncology service (Dr. Marko Plume)   CCS   Procedures:  CT scan shows: Widely metastatic cervical carcinoma with unchanged appearance compared to 04/26/2015. Bulky tumor narrows and likely invades the rectum/distal sigmoid with progressive proximal stool retention. She has hepatic metastasis, multiple intraabdominal nodes, and retroperitoneal adenopathy, on the current CT.   Planned diverting colostomy on 05/18/15  Antibiotics:  Rocephin 3/12  HPI/Subjective: She is being seen post procedure, has no complaints reports soreness over her abdominal region  Objective: Filed Vitals:   05/20/15 1008 05/20/15 1414  BP: 128/61 131/72  Pulse: 95 84  Temp: 98.3 F (36.8 C) 98.6 F (37 C)  Resp: 16 16    Intake/Output Summary (Last 24 hours) at 05/20/15 1730 Last data filed at 05/20/15 1700  Gross per 24 hour  Intake 3058.75 ml  Output   1635 ml  Net 1423.75 ml   Filed Weights   05/15/15 1857 05/16/15 0057  Weight: 76.658 kg (169 lb) 74.844 kg (165 lb)    Exam:   General:  Patient is seen post procedure, she is awake and alert  Cardiovascular: S1 and S2, no rubs or gallops  Respiratory: good air movement, no  wheezing  Abdomen: Status post colostomy, bag with serosanguineous fluid, approved abdominal pain to palpation on today's exam  Musculoskeletal: no edema, no cyanosis   Data Reviewed: Basic Metabolic Panel:  Recent Labs Lab 05/15/15 2109 05/16/15 0413 05/18/15 0454 05/19/15 0427 05/20/15 0411  NA 141 140 140 136 139  K 4.7 4.5 3.7 3.6 3.2*  CL 104 103 103 100* 104  CO2 28 28 25 26  21*  GLUCOSE 114* 115* 97 91 60*  BUN 14 11 11 8 6   CREATININE 0.69 0.68 0.54 0.64 0.45  CALCIUM 8.8* 8.8* 9.0 8.7* 8.7*   Liver Function Tests:  Recent Labs Lab 05/15/15 2109  AST 35  ALT 36  ALKPHOS 96  BILITOT 1.0  PROT 7.0  ALBUMIN 3.8   CBC:  Recent Labs Lab 05/15/15 2109 05/16/15 0413 05/18/15 0454 05/19/15 0427 05/20/15 0411  WBC 7.3 6.0 4.5 2.0* 2.1*  NEUTROABS 6.0  --  3.5 0.6* 0.0*  HGB 13.8 13.8 13.0 12.0 11.7*  HCT 42.7 42.0 38.9 36.0 35.2*  MCV 87.3 87.1 82.4 85.5 85.6  PLT 222 191 126* 121* 128*   CBG: No results for input(s): GLUCAP in the last 168 hours.  Recent Results (from the past 240 hour(s))  Urine culture     Status: None   Collection Time: 05/15/15  9:23 PM  Result Value Ref Range Status   Specimen Description URINE, CLEAN CATCH  Final   Special Requests NONE  Final   Culture   Final    MULTIPLE SPECIES PRESENT, SUGGEST RECOLLECTION Performed at [facility]    Report Status 05/17/2015 FINAL  Final  Surgical pcr screen     Status: None   Collection Time: 05/18/15  5:09 AM  Result Value Ref Range Status   MRSA, PCR NEGATIVE NEGATIVE Final   Staphylococcus aureus NEGATIVE NEGATIVE Final    Comment:        The Xpert SA Assay (FDA approved for NASAL specimens in patients over 5 years of age), is one component of a comprehensive surveillance program.  Test performance has been validated by [facility] for patients greater than or equal to 20 year old. It is not intended to diagnose infection nor to guide or monitor treatment.       Studies: No results found.  Scheduled Meds: . acetaminophen (TYLENOL) oral liquid 160 mg/5 mL  1,000 mg Oral Q6H  . cefTRIAXone (ROCEPHIN)  IV  1 g Intravenous Q24H  . enoxaparin (LOVENOX) injection  40 mg Subcutaneous Q24H  . ondansetron (ZOFRAN) IV  4 mg Intravenous Once  . pantoprazole (PROTONIX) IV  40 mg Intravenous Daily  . Tbo-filgastrim (GRANIX) SQ  480 mcg Subcutaneous Daily   Continuous Infusions: . dextrose 5 % and 0.9 % NaCl with KCl 40 mEq/L 125 mL/hr (05/20/15 1719)    Principal Problem:   Constipation by outlet obstruction Active Problems:   International Federation of Gynecology and Obstetrics (FIGO) stage IVB malignant neoplasm of cervix ([place])   Urinary tract infection, site not specified   Small bowel obstruction ([place])    Time spent: 15 minutes    Kelvin Cellar  [facility] Pager [phone]. If 7PM-7AM, please contact night-coverage at [email or web address], password [facility] 05/20/2015, 5:30 PM  LOS: 5 days

## 2015-05-20 NOTE — Consult Note (Signed)
WOC ostomy follow up Stoma type/location: LLQ Colostomy with red rubber retention rod in place. Pouch change today.  Stomal assessment/size: 1 1/2" pink and moist protruding.  Blood tinged liquid only at this time.  Peristomal assessment: Slight creasing at 3 and 9:00 will add a barrier ring and 2 piece system Treatment options for stomal/peristomal skin: barrier ring Output Blood tinged liquid Ostomy pouching: 2pc. 2 3/4" 2 piece flat pouch with barrier ring   Education provided: POuch change performed.  Intestinal gas being produced.  Discussed how the filtered pouches would eliminate the bag blowing out.  Rubber cath in place.  Patient understands to empty when 1/3 full and pouch changes twice weekly.  Feels confident regarding self care.  Assisted her family member with care.  Enrolled patient in [place] Start Discharge program: Yes  Will today [place] team will follow.  Domenic Moras RN BSN [place] Pager [phone]

## 2015-05-20 NOTE — Progress Notes (Signed)
2 Days Post-Op  Subjective: She is doing well, she isn't really taking anything for pain.  She has gas and some sweat in the ostomy bag.  No stool yet.  She doesn't have any complaints.  Objective: Vital signs in last 24 hours: Temp:  [97.4 F (36.3 C)-98.9 F (37.2 C)] 97.4 F (36.3 C) (03/17 0509) Pulse Rate:  [89-96] 93 (03/17 0509) Resp:  [16] 16 (03/17 0509) BP: (123-135)/(61-78) 129/61 mmHg (03/17 0509) SpO2:  [95 %-98 %] 95 % (03/17 0509) Last BM Date: 05/09/15 Urine 1125,  colostomy 10 recorded. Afebrile, VSS K+ 3.2, WBC 2.1 Platelets 128 K Glucose down to 60 with AM labs Intake/Output from previous day: 03/16 0701 - 03/17 0700 In: 1953.8 [I.V.:1753.8; IV Piggyback:200] Out: 1135 [Urine:1125; Stool:10] Intake/Output this shift:    General appearance: alert, cooperative and no distress Resp: clear to auscultation bilaterally GI: soft, sore, sites look fine.  Ostomy swelling a little better, she has gas and clear fluid in the ostomy bag.  No stool so far.  Lab Results:   Recent Labs  05/19/15 0427 05/20/15 0411  WBC 2.0* 2.1*  HGB 12.0 11.7*  HCT 36.0 35.2*  PLT 121* 128*    BMET  Recent Labs  05/19/15 0427 05/20/15 0411  NA 136 139  K 3.6 3.2*  CL 100* 104  CO2 26 21*  GLUCOSE 91 60*  BUN 8 6  CREATININE 0.64 0.45  CALCIUM 8.7* 8.7*   PT/INR  Recent Labs  05/18/15 0454  LABPROT 14.2  INR 1.08     Recent Labs Lab 05/15/15 2109  AST 35  ALT 36  ALKPHOS 96  BILITOT 1.0  PROT 7.0  ALBUMIN 3.8     Lipase     Component Value Date/Time   LIPASE 37 04/26/2015 1705     Studies/Results: No results found.  Medications: . cefTRIAXone (ROCEPHIN)  IV  1 g Intravenous Q24H  . enoxaparin (LOVENOX) injection  40 mg Subcutaneous Q24H  . ondansetron (ZOFRAN) IV  4 mg Intravenous Once  . pantoprazole (PROTONIX) IV  40 mg Intravenous Daily  . Tbo-filgastrim (GRANIX) SQ  480 mcg Subcutaneous Daily   . dextrose 5 %-0.9% nacl with kcl      Assessment/Plan METASTATIC CERVICAL CANCER WITH OBSTRUCTION S/p LAPAROSCOPIC DIVERTING SIGMOID LOOP COLOSTOMY, [id number], Dr. Leighton Ruff Hx of GERD Remote history of Migraines Hypolalemia [id number] Neutropenia/thrombcytopenia  Antibiotics: Day 6 Rocephin DVT: SCD/Lovenox restarted last PM after discussion with Dr. Marko Plume.   Plan:  PO tylenol, and start some clears, I told her to go slow and make sure she does not have nausea with the PO intake.     LOS: 5 days    Katherine Boone 05/20/2015

## 2015-05-20 NOTE — Progress Notes (Signed)
Medical Oncology May 20, 2015, 8:07 AM  Hospital day 6 Post op day 1 diverting sigmoid loop colostomy Antibiotics: Rocephin Chemotherapy: day 9 cycle 1 carboplatin taxol Granix day 5  Neutropenic, ANC 0 this AM, remains afebrile.  Discussed with unit RN, granix to be given now.  Subjective: "feeling better than yesterday". Denies feeling shaky or diaphoretic, blood sugar 60 with 0400 labs today. Had gas filling ostomy bag yesterday, and some serosanguinous fluid, no stool. Passing nothing from rectum. Abdomen and ostomy area less sore today, took no pain medication in last 24 hrs. Denies nausea or vomiting. Has tolerated ice chips and tried popsicle. Tolerating up to [facility]. Voiding lots, foley out 3-16 AM. No bleeding. Slight NP cough x1, denies SOB.   ONCOLOGIC HISTORY Patient was in her usual very good health until new cramping abdominal pain intermittently for last few months. She had some constipation with this, particularly severe when she saw PCP for this reason on 04-26-15. She has had no vaginal bleeding. She had no bowel obstruction by xray and bowels moved with miralax. CT AP 04-26-15 had 10.3 x 13.8 mixed cystic and solid mass involving uterus and extending posteriorly into cul de sac and into bilateral adnexal regions, sigmoid colon tethered to mass, no ascites, scattered necrotic lymph nodes in mesentery and omentum, no hydroureter, 3 lesions in liver up to 3.4 cm, and numerous bilateral pulmonary nodules in lung bases. CEA was 1.1 and CA 125 was 105; CBC and CMET 04-26-15 were entirely normal. 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 She had CT biopsy of pelvic mass in region of left psoas on 3-1-1 7. Pathology [phone], showed high grade poorly differentiated carcinoma with immunohistochemical stains suggesting cervical primary, which did not fit clinical picture well. CT chest 05-10-15 showed multiple bilateral pulmonary nodules  (>40), 3 liver lesions up to 3.7 cm, left supraclavicular node and left hilar nodes. Chemotherapy was begun with first carboplatin and taxol on 05-12-15. Admitted with colonic obstruction 05-15-15, diverting sigmoid loop colostomy 05-18-15. Neutropenic 05-19-15 (day 8 cycle 1) despite granix begun day 5.   Objective: Vital signs in last 24 hours: Blood pressure 129/61, pulse 93, temperature 97.4 F (36.3 C), temperature source Oral, resp. rate 16, height 5\' 7"  (1.702 m), weight 165 lb (74.844 kg), SpO2 95 %.  Awake, alert, looks comfortable supine in bed, respirations not labored. PERRL. Oral mucosa moist without lesions. Lungs without wheezes or rales. Peripheral IV RUE, NSL out, no central line. Heart RRR. Abdomen still distended, not tight, active BS, not tender to gentle exam.  Small serosanguinous fluid in ostomy bag. LE no edema, cords, tenderness. Moves extremities easily, feet warm. Skin not diaphoretic. Not tremulous.  Intake/Output from previous day: 03/16 0701 - 03/17 0700 In: 1953.8 [I.V.:1753.8; IV Piggyback:200] Out: [id number] [Urine:1125; Stool:10] Intake/Output this shift:   IVF changed now to D5NS with 30 K at 125 due to glucose 60 and K 3.2 early AM. (fine for hospitalist or surgery to adjust my order if they prefer other).  Lab Results:  Recent Labs  05/19/15 0427 05/20/15 0411  WBC 2.0* 2.1*  HGB 12.0 11.7*  HCT 36.0 35.2*  PLT 121* 128*   ANC today 0  BMET  Recent Labs  05/19/15 0427 05/20/15 0411  NA 136 139  K 3.6 3.2*  CL 100* 104  CO2 26 21*  GLUCOSE 91 60*  BUN 8 6  CREATININE 0.64 0.45  CALCIUM 8.7* 8.7*  Studies/Results: No results found.   Assessment/Plan: 1.post op day 2  diverting sigmoid loop colostomy for colonic obstruction: more bowel sounds and some gas in ostomy bag.  2. Gyn malignancy with large pelvic mass involving uterus and bilateral adnexal regions, adenopathy, possible liver involvement and CT consistent with numerous bilateral  pulmonary mets. Pathology of peritoneal mass is high grade poorly differentiated carcinoma with immunohistochemical pattern suggesting cervical primary. CT chest with multiple bilateral lung mets. Chemotherapy with carboplatin taxol begiun 05-12-15 in attempt to improve disease, but will not be curative. Not candidate for gyn oncology debulking. Intraoperative findings reviewed in Dr Marcello Moores' note.  3.chemotherapy neutropenia: ANC 0 today, tho fortunately not febrile at present and otherwise clinically looks stable. On granix since 3-13, 480 mcg daily. Neutropenic precautions, every 4 hr vitals for now, if temp >=100.5 or shaking chills/ symptoms of infection would need cultures and to broaden antibiotic coverage with vancomycin. Note platelets have not dropped further today, so may be at nadir now. 4. 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 Needs to resume prophylactic acyclovir when able to take po, or could give IV if any recurrence while NPO 9.flu vaccine 05-06-15 10. Nutrition: hopefully will be able to resume po's in near future when bowel function returns  Will follow with you. Please page me if I can assist between my rounds [phone], or med onc MD on call LIVESAY,LENNIS P

## 2015-05-21 LAB — CBC WITH DIFFERENTIAL/PLATELET
BASOS PCT: 0 %
Basophils Absolute: 0 10*3/uL (ref 0.0–0.1)
EOS PCT: 3 %
Eosinophils Absolute: 0.3 10*3/uL (ref 0.0–0.7)
HEMATOCRIT: 34.9 % — AB (ref 36.0–46.0)
Hemoglobin: 11.7 g/dL — ABNORMAL LOW (ref 12.0–15.0)
LYMPHS ABS: 3.8 10*3/uL (ref 0.7–4.0)
Lymphocytes Relative: 46 %
MCH: 28.4 pg (ref 26.0–34.0)
MCHC: 33.5 g/dL (ref 30.0–36.0)
MCV: 84.7 fL (ref 78.0–100.0)
MONOS PCT: 40 %
Monocytes Absolute: 3.3 10*3/uL — ABNORMAL HIGH (ref 0.1–1.0)
Neutro Abs: 0.9 10*3/uL — ABNORMAL LOW (ref 1.7–7.7)
Neutrophils Relative %: 11 %
Platelets: 150 10*3/uL (ref 150–400)
RBC: 4.12 MIL/uL (ref 3.87–5.11)
RDW: 12.7 % (ref 11.5–15.5)
WBC: 8.3 10*3/uL (ref 4.0–10.5)

## 2015-05-21 LAB — BASIC METABOLIC PANEL
ANION GAP: 6 (ref 5–15)
BUN: <5 mg/dL — ABNORMAL LOW (ref 6–20)
CALCIUM: 8.5 mg/dL — AB (ref 8.9–10.3)
CO2: 23 mmol/L (ref 22–32)
Chloride: 109 mmol/L (ref 101–111)
Creatinine, Ser: 0.48 mg/dL (ref 0.44–1.00)
GFR calc Af Amer: >60 mL/min (ref >60)
GFR calc non Af Amer: >60 mL/min (ref >60)
GLUCOSE: 142 mg/dL — AB (ref 65–99)
POTASSIUM: 4.4 mmol/L (ref 3.5–5.1)
Sodium: 138 mmol/L (ref 135–145)

## 2015-05-21 MED ORDER — TBO-FILGRASTIM 300 MCG/0.5ML ~~LOC~~ SOSY
300.0000 ug | PREFILLED_SYRINGE | Freq: Once | SUBCUTANEOUS | Status: AC
  Administered 2015-05-21: 300 ug via SUBCUTANEOUS
  Filled 2015-05-21: qty 0.5

## 2015-05-21 MED ORDER — ACYCLOVIR 200 MG PO CAPS
200.0000 mg | ORAL_CAPSULE | Freq: Three times a day (TID) | ORAL | Status: DC
  Administered 2015-05-21 – 2015-05-23 (×8): 200 mg via ORAL
  Filled 2015-05-21 (×9): qty 1

## 2015-05-21 MED ORDER — LORATADINE 10 MG PO TABS
10.0000 mg | ORAL_TABLET | Freq: Every day | ORAL | Status: DC
  Administered 2015-05-21 – 2015-05-23 (×3): 10 mg via ORAL
  Filled 2015-05-21 (×3): qty 1

## 2015-05-21 MED ORDER — CEFUROXIME AXETIL 500 MG PO TABS
500.0000 mg | ORAL_TABLET | Freq: Two times a day (BID) | ORAL | Status: DC
  Administered 2015-05-21 – 2015-05-23 (×4): 500 mg via ORAL
  Filled 2015-05-21 (×6): qty 1

## 2015-05-21 MED ORDER — ZOLPIDEM TARTRATE 5 MG PO TABS
5.0000 mg | ORAL_TABLET | Freq: Every evening | ORAL | Status: DC | PRN
  Administered 2015-05-21: 5 mg via ORAL
  Filled 2015-05-21: qty 1

## 2015-05-21 MED ORDER — POTASSIUM CHLORIDE 2 MEQ/ML IV SOLN
INTRAVENOUS | Status: DC
  Administered 2015-05-21 – 2015-05-23 (×4): via INTRAVENOUS
  Filled 2015-05-21 (×8): qty 1000

## 2015-05-21 NOTE — Progress Notes (Signed)
[facility] PROGRESS NOTE  Katherine Boone [id number] DOB: 04-13-1945 DOA: 05/15/2015 PCP: [name], MD  Brief history of admission and interim summary 70 y.o. female with recently diagnosed (04/26/2015) Metastatic Cervical Cancer S/P first Chemo Rx 4 days ago who presents to the ED with complaints of Constipation x 6 days. She has had Nausea mainly and had some but denies any Vomiting. She has had ABD distension and Bloating. She took Miralax without Relief. She denies any Fevers or Chills. A Ct Scan of the ABD/Pelvis revealed widely Metastatic Cervical Cancer unchanged from 04/26/2015.    Assessment/Plan: 1-Nausea/abd pain and constipation: secondary to SBO/outlet obstruction -A CT scan of abdomen and pelvis performed on 05/15/2015 showed widely metastatic cervical carcinoma with bulky tumor narrowing and invading rectum/distal sigmoid with progressive proximal stool retention. -Gen. surgery was consulted -On 05/18/2015 she underwent laparoscopic diverging sigmoid loop colostomy, procedure performed by Dr. Marcello Moores -She tolerated procedure well there were no immediate complications -Continue maintenance fluids with normal saline run at 75 mL/hour -Patient started on sips of clears today. -Meanwhile ostomy site looks clean, however, no output thus far -Diet was advanced to full liquids. Overnight reported having several episodes of N/V after taking tylenol -Seems better today, she will try some fulls today.   2-Cervical cancer with metastasis and with narrowing/invasion distal rectum/sigmoid colon: -oncology service consulted -actively receiving chemotherapy -As mentioned above patient undergoing laparoscopic diverging sigmoid loop colostomy on 05/18/2015 -Seen by Dr Marko Plume, next chemotherapy 06/02/2015  3-GERD -will continue IV PPI for now  4-UTI: -empirically started on rocephin -reported frequency and dysuria prior to admission -Patient currently  without any dysuria or complaints. She remained afebrile and with normal WBCs. -Urine culture showing multiple species -Remains afebrile has received 4 days of IV ceftriaxone. Will transition to ceftin  5.  Neutropenia. -A.m. lab work showing white count of 2000 -Neutropenia likely secondary to chemotherapy -Medical oncology following, on Granix -Thus far she has not spiked any temperatures -Labs on 05/21/2015 showing neutrophil count of 0.9  6.  Hypokalemia. -Improved with Kdur.   7-DVT: -SCD's   Code Status: Full Family Communication: multiple family members at bedside updated on patient's condition Disposition Plan: Patient undergoing diverging colostomy on 05/18/2015, plan to follow surgery's recommendations on advancement of diet   Consultants:  Oncology service (Dr. Marko Plume)   CCS   Procedures:  CT scan shows: Widely metastatic cervical carcinoma with unchanged appearance compared to 04/26/2015. Bulky tumor narrows and likely invades the rectum/distal sigmoid with progressive proximal stool retention. She has hepatic metastasis, multiple intraabdominal nodes, and retroperitoneal adenopathy, on the current CT.   Diverting colostomy on 05/18/15  Antibiotics:  Rocephin 3/12  HPI/Subjective: She appears to be doing well. Reported N/V yesterday  Objective: Filed Vitals:   05/20/15 2110 05/21/15 0519  BP: 145/65 131/66  Pulse: 103 99  Temp: 99.4 F (37.4 C) 98.9 F (37.2 C)  Resp: 16 16    Intake/Output Summary (Last 24 hours) at 05/21/15 1506 Last data filed at 05/21/15 1038  Gross per 24 hour  Intake 895.83 ml  Output   1950 ml  Net -1054.17 ml   Filed Weights   05/15/15 1857 05/16/15 0057  Weight: 76.658 kg (169 lb) 74.844 kg (165 lb)    Exam:   General:  Patient is calm and cooperative. Nontoxic apearing  Cardiovascular: S1 and S2, no rubs or gallops  Respiratory: good air movement, no wheezing  Abdomen: Status post colostomy, mild  generalized abdominal pain to palpation on  today's exam  Musculoskeletal: no edema, no cyanosis   Data Reviewed: Basic Metabolic Panel:  Recent Labs Lab 05/16/15 0413 05/18/15 0454 05/19/15 0427 05/20/15 0411 05/21/15 0508  NA 140 140 136 139 138  K 4.5 3.7 3.6 3.2* 4.4  CL 103 103 100* 104 109  CO2 28 25 26  21* 23  GLUCOSE 115* 97 91 60* 142*  BUN 11 11 8 6  <5*  CREATININE 0.68 0.54 0.64 0.45 0.48  CALCIUM 8.8* 9.0 8.7* 8.7* 8.5*   Liver Function Tests:  Recent Labs Lab 05/15/15 2109  AST 35  ALT 36  ALKPHOS 96  BILITOT 1.0  PROT 7.0  ALBUMIN 3.8   CBC:  Recent Labs Lab 05/15/15 2109 05/16/15 0413 05/18/15 0454 05/19/15 0427 05/20/15 0411 05/21/15 0508  WBC 7.3 6.0 4.5 2.0* 2.1* 8.3  NEUTROABS 6.0  --  3.5 0.6* 0.0* 0.9*  HGB 13.8 13.8 13.0 12.0 11.7* 11.7*  HCT 42.7 42.0 38.9 36.0 35.2* 34.9*  MCV 87.3 87.1 82.4 85.5 85.6 84.7  PLT 222 191 126* 121* 128* 150   CBG: No results for input(s): GLUCAP in the last 168 hours.  Recent Results (from the past 240 hour(s))  Urine culture     Status: None   Collection Time: 05/15/15  9:23 PM  Result Value Ref Range Status   Specimen Description URINE, CLEAN CATCH  Final   Special Requests NONE  Final   Culture   Final    MULTIPLE SPECIES PRESENT, SUGGEST RECOLLECTION Performed at [facility]    Report Status 05/17/2015 FINAL  Final  Surgical pcr screen     Status: None   Collection Time: 05/18/15  5:09 AM  Result Value Ref Range Status   MRSA, PCR NEGATIVE NEGATIVE Final   Staphylococcus aureus NEGATIVE NEGATIVE Final    Comment:        The Xpert SA Assay (FDA approved for NASAL specimens in patients over 53 years of age), is one component of a comprehensive surveillance program.  Test performance has been validated by [facility] for patients greater than or equal to 32 year old. It is not intended to diagnose infection nor to guide or monitor treatment.      Studies: No results  found.  Scheduled Meds: . acetaminophen  1,000 mg Oral 4 times per day  . acyclovir  200 mg Oral TID  . cefTRIAXone (ROCEPHIN)  IV  1 g Intravenous Q24H  . enoxaparin (LOVENOX) injection  40 mg Subcutaneous Q24H  . loratadine  10 mg Oral Daily  . ondansetron (ZOFRAN) IV  4 mg Intravenous Once  . pantoprazole (PROTONIX) IV  40 mg Intravenous Daily   Continuous Infusions: . dextrose 5 %-0.9% nacl with kcl      Principal Problem:   Constipation by outlet obstruction Active Problems:   International Federation of Gynecology and Obstetrics (FIGO) stage IVB malignant neoplasm of cervix ([place])   Urinary tract infection, site not specified   Small bowel obstruction ([employer])   Chemotherapy induced neutropenia ([place])    Time spent: 15 minutes    Kelvin Cellar  [facility] Pager [phone]. If 7PM-7AM, please contact night-coverage at [email or web address], password [facility] 05/21/2015, 3:06 PM  LOS: 6 days

## 2015-05-21 NOTE — Progress Notes (Signed)
Patient ID: Katherine Boone, female   DOB: 1945/06/22, 70 y.o.   MRN: [id number] 3 Days Post-Op  Subjective: Nauseated yesterday and last night without vomiting.  Has noted a lot of gurgling in her abdomen. No significant pain, just sore  Objective: Vital signs in last 24 hours: Temp:  [98.3 F (36.8 C)-99.4 F (37.4 C)] 98.9 F (37.2 C) (03/18 0519) Pulse Rate:  [84-111] 99 (03/18 0519) Resp:  [16] 16 (03/18 0519) BP: (128-145)/(61-76) 131/66 mmHg (03/18 0519) SpO2:  [93 %-97 %] 93 % (03/18 0519) Last BM Date: 05/09/15  Intake/Output from previous day: 03/17 0701 - 03/18 0700 In: 2225.8 [P.O.:180; I.V.:2045.8] Out: 2400 [Urine:2400] Intake/Output this shift:    General appearance: alert, cooperative and no distress GI: mild distention but soft  and only mild tenderness around her ostomy. Small amount of stool in ostomy bag. Incision/Wound: clean and dry without evidence of infection  Lab Results:   Recent Labs  05/20/15 0411 05/21/15 0508  WBC 2.1* 8.3  HGB 11.7* 11.7*  HCT 35.2* 34.9*  PLT 128* 150   BMET  Recent Labs  05/20/15 0411 05/21/15 0508  NA 139 138  K 3.2* 4.4  CL 104 109  CO2 21* 23  GLUCOSE 60* 142*  BUN 6 <5*  CREATININE 0.45 0.48  CALCIUM 8.7* 8.5*     Studies/Results: No results found.  Anti-infectives: Anti-infectives    Start     Dose/Rate Route Frequency Ordered Stop   05/17/15 1000  cefoTEtan (CEFOTAN) 2 g in dextrose 5 % 50 mL IVPB  Status:  Discontinued     2 g 100 mL/hr over 30 Minutes Intravenous On call to O.R. 05/17/15 0931 05/18/15 1427   05/17/15 0000  cefTRIAXone (ROCEPHIN) 1 g in dextrose 5 % 50 mL IVPB  Status:  Discontinued     1 g 100 mL/hr over 30 Minutes Intravenous Every 24 hours 05/16/15 0047 05/16/15 0048   05/16/15 2300  cefTRIAXone (ROCEPHIN) 1 g in dextrose 5 % 50 mL IVPB     1 g 100 mL/hr over 30 Minutes Intravenous Every 24 hours 05/16/15 0048     05/15/15 2330  cefTRIAXone (ROCEPHIN) 1 g in dextrose 5 %  50 mL IVPB     1 g 100 mL/hr over 30 Minutes Intravenous  Once 05/15/15 2318 05/16/15 0027      Assessment/Plan: s/p Procedure(s): LAPAROSCOPIC DIVERTED LOOP COLOSTOMY  Stable postop.  Still with some nausea. Continue clear liquids today.Ambulation encouraged.   LOS: 6 days    Katherine Boone T 05/21/2015

## 2015-05-21 NOTE — Progress Notes (Signed)
MEDICAL ONCOLOGY May 21, 2015, 9:48 AM  Hospital day 7 Post op day 3 diverting sigmoid loop colostomy Antibiotics: rocephin Chemotherapy: day 10 cycle 1 carbo taxol Granix day 6, will DC after today  Outpatient MDs: D.ClarkePearson, J.Gutierrez (PCP), L.Daliya Parchment    ANC up to 0.9 today, almost out of neutropenic range.  Patient seen, then also discussed with RN at bedside   Subjective: Nausea much of day yesterday, which she relates to taking liquid tylenol. No vomiting. Sipped water only yesterday due to nausea, IV zofran 4 mg x 1 was helpful.  Soreness lateral to ostomy, not severe, does not want other pain meds but will take tylenol as tablet. Woke once in night "could not catch my breath", sat up and that resolved, some cough then. No bone pain from granix, which will be DCd after dose today. Some liquid stool in ostomy bag now. Feels pressure at rectum, likely with pelvic involvement from cancer and residual stool in that portion of bowel, not passing anything from rectum. Up in room only yesterday with WBC so low, but ok today with counts improved to walk in hall with mask. No bleeding. Voiding easily. No LE swelling. Clear rhinorrhea, would like claritin. No pain at resolving herpetic lesions at coccyx. Not lightheaded when sits on side of bed. RUE swollen yesterday with IV on that side, some better today.   ONCOLOGIC HISTORY Patient was in her usual very good health until new cramping abdominal pain intermittently for last few months. She had some constipation with this, particularly severe when she saw PCP for this reason on 04-26-15. She has had no vaginal bleeding. She had no bowel obstruction by xray and bowels moved with miralax. CT AP 04-26-15 had 10.3 x 13.8 mixed cystic and solid mass involving uterus and extending posteriorly into cul de sac and into bilateral adnexal regions, sigmoid colon tethered to mass, no ascites, scattered necrotic lymph nodes in mesentery and omentum,  no hydroureter, 3 lesions in liver up to 3.4 cm, and numerous bilateral pulmonary nodules in lung bases. CEA was 1.1 and CA 125 was 105; CBC and CMET 04-26-15 were entirely normal. 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 She had CT biopsy of pelvic mass in region of left psoas on 3-1-1 7. Pathology [phone], showed high grade poorly differentiated carcinoma with immunohistochemical stains suggesting cervical primary, which did not fit clinical picture well. CT chest 05-10-15 showed multiple bilateral pulmonary nodules (>40), 3 liver lesions up to 3.7 cm, left supraclavicular node and left hilar nodes. Chemotherapy was begun with first carboplatin and taxol on 05-12-15. Admitted with colonic obstruction 05-15-15, diverting sigmoid loop colostomy 05-18-15. Neutropenic 05-19-15 (day 8 cycle 1) despite granix begun day 5.   Objective: Vital signs in last 24 hours: Blood pressure 131/66, pulse 99, temperature 98.9 F (37.2 C), temperature source Oral, resp. rate 16, height 5\' 7"  (1.702 m), weight 165 lb (74.844 kg), SpO2 93 %. Awake, alert, respirations not labored RA, able to sit up to SOB from supine with minimal assistance. Oral mucosa moist and clear. Lungs without wheezes or rales, slightly diminished BS left base posteriorly. Heart RRR. RUE mildly swollen at upper forearm without cords or tenderness, no erythema. Tape irritation at antecubital on right. IV infusing now left forearm, site ok. Abdomen somewhat distended but soft, active BS in upper quadrants, liquid light brown stool in ostomy bag. Somewhat tender left abdomen lateral to ostomy. LE no edema, cords, tenderness.  Crusted herpetic lesions at coccyx, covered with clean dressing.  Speech fluent and appropriate.   Intake/Output from previous day: 03/17 0701 - 03/18 0700 In: 2225.8 [P.O.:180; I.V.:2045.8] Out: 2400 [Urine:2400] Intake/Output this shift:   IVF has been D5NS with 30 KCl at  125 for past 24 hrs, changed now to D5NS with [id number], tho certainly surgery or hospitalist can adjust if they prefer.   Lab Results:  Recent Labs  05/20/15 0411 05/21/15 0508  WBC 2.1* 8.3  HGB 11.7* 11.7*  HCT 35.2* 34.9*  PLT 128* 150   BMET  Recent Labs  05/20/15 0411 05/21/15 0508  NA 139 138  K 3.2* 4.4  CL 104 109  CO2 21* 23  GLUCOSE 60* 142*  BUN 6 <5*  CREATININE 0.45 0.48  CALCIUM 8.7* 8.5*    Studies/Results: No results found.   MEDICATIONS IV zofran 4 mg x1 now as she has some nausea still Tylenol now by tablet Claritin 10 mg po daily Resumed po acyclovir at 200 mg tid (recent recurrence of chronic herpetic lesions perirectal area) Granix 300 mcg x 1 today then DC IVF as above   DISCUSSION Improvement in [place] and platelets discussed. OK to walk in hall tho would still wear mask for this today. Will recheck CBC diff in AM.  Meds as above Per surgery, clear liquids today. Discussed HH assistance with ostomy and ostomy supplies initially after DC  Assessment/Plan: 1.post op day 3 diverting sigmoid loop colostomy for colonic obstruction: ostomy beginning to function 2. Gyn malignancy with large pelvic mass involving uterus and bilateral adnexal regions, adenopathy, possible liver involvement and numerous bilateral pulmonary mets. Pathology of peritoneal mass is high grade poorly differentiated carcinoma with immunohistochemical pattern suggesting cervical primary; clinically felt more consistent with ovarian primary. CT chest with multiple bilateral lung mets. Chemotherapy with carboplatin taxol begiun 05-12-15 in attempt to improve disease, but will not be curative. Not candidate for gyn oncology debulking. Intraoperative findings reviewed in Dr Marcello Moores' note.  3.chemotherapy neutropenia: ANC has improved rapidly in last 24 hours, now almost out of neutropenic range. Platelets increasing, so clearly past nadir, hemoglobin stable   Next chemo will be due  March 30 if ok from surgery standpoint by then.  4. Hypokalemia and hypoglycemia resolved with change in IVF yesterday. Orders changed now, other services can adjust if they have other preferences. 5.up to date mammograms. Osteoporosis by last DEXA 6.GERD improved with prilosec 7 no advance directives 8. 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 Resume prophylactic acyclovir. Areas improving, now crusted. 9.flu vaccine 05-06-15 10. Nutrition: hopefully will tolerate more clear liquids today. 11.IV infiltration RUE: improving per patient since that IV DCd, elevate. She may need central line in future as we continue outpatient chemo 12. Environmental allergies: resume usual claritin   Please page me if needed between my rounds [phone], or med onc on call [place]

## 2015-05-22 LAB — CBC WITH DIFFERENTIAL/PLATELET
BASOS PCT: 0 %
Basophils Absolute: 0 10*3/uL (ref 0.0–0.1)
Eosinophils Absolute: 0.3 10*3/uL (ref 0.0–0.7)
Eosinophils Relative: 1 %
HEMATOCRIT: 34.9 % — AB (ref 36.0–46.0)
HEMOGLOBIN: 11.7 g/dL — AB (ref 12.0–15.0)
LYMPHS PCT: 12 %
Lymphs Abs: 3.1 10*3/uL (ref 0.7–4.0)
MCH: 28.3 pg (ref 26.0–34.0)
MCHC: 33.5 g/dL (ref 30.0–36.0)
MCV: 84.3 fL (ref 78.0–100.0)
MONOS PCT: 9 %
Monocytes Absolute: 2.3 10*3/uL — ABNORMAL HIGH (ref 0.1–1.0)
NEUTROS ABS: 20.1 10*3/uL — AB (ref 1.7–7.7)
Neutrophils Relative %: 78 %
Platelets: 155 10*3/uL (ref 150–400)
RBC: 4.14 MIL/uL (ref 3.87–5.11)
RDW: 13 % (ref 11.5–15.5)
WBC: 25.8 10*3/uL — ABNORMAL HIGH (ref 4.0–10.5)

## 2015-05-22 LAB — BASIC METABOLIC PANEL
Anion gap: 9 (ref 5–15)
CHLORIDE: 109 mmol/L (ref 101–111)
CO2: 23 mmol/L (ref 22–32)
CREATININE: 0.53 mg/dL (ref 0.44–1.00)
Calcium: 8.6 mg/dL — ABNORMAL LOW (ref 8.9–10.3)
GFR calc Af Amer: >60 mL/min (ref >60)
GFR calc non Af Amer: >60 mL/min (ref >60)
GLUCOSE: 105 mg/dL — AB (ref 65–99)
Potassium: 3.9 mmol/L (ref 3.5–5.1)
Sodium: 141 mmol/L (ref 135–145)

## 2015-05-22 NOTE — Progress Notes (Signed)
[facility] PROGRESS NOTE  Katherine Boone [id number] DOB: [birth year] DOA: 05/15/2015 PCP: [name], MD  Brief history of admission and interim summary 70 y.o. female with recently diagnosed (04/26/2015) Metastatic Cervical Cancer S/P first Chemo Rx 4 days ago who presents to the ED with complaints of Constipation x 6 days. She has had Nausea mainly and had some but denies any Vomiting. She has had ABD distension and Bloating. She took Miralax without Relief. She denies any Fevers or Chills. A Ct Scan of the ABD/Pelvis revealed widely Metastatic Cervical Cancer unchanged from 04/26/2015.    Assessment/Plan: 1-Nausea/abd pain and constipation: secondary to SBO/outlet obstruction -A CT scan of abdomen and pelvis performed on 05/15/2015 showed widely metastatic cervical carcinoma with bulky tumor narrowing and invading rectum/distal sigmoid with progressive proximal stool retention. -Gen. surgery was consulted -On 05/18/2015 she underwent laparoscopic diverging sigmoid loop colostomy, procedure performed by Dr. Marcello Moores -She tolerated procedure well there were no immediate complications -Continue maintenance fluids with normal saline run at 75 mL/hour -Patient started on sips of clears today. -Meanwhile ostomy site looks clean, however, no output thus far -Diet was advanced to full liquids. Overnight reported having several episodes of N/V after taking tylenol -on 05/22/2015 she continues to show improvement, now having ostomy output. He diet was advanced to regular diet.   2-Cervical cancer with metastasis and with narrowing/invasion distal rectum/sigmoid colon: -oncology service consulted -actively receiving chemotherapy -As mentioned above patient undergoing laparoscopic diverging sigmoid loop colostomy on 05/18/2015 -Seen by Dr Marko Plume, next chemotherapy 06/02/2015  3-GERD -will continue IV PPI for now  4-UTI: -empirically started on rocephin -reported  frequency and dysuria prior to admission -Patient currently without any dysuria or complaints. She remained afebrile and with normal WBCs. -Urine culture showing multiple species -Remains afebrile has received 4 days of IV ceftriaxone. She was transitioned to ceftin  5.  Neutropenia. -Resolved -A.m. Lab on 05/22/2015 work showing white count of 25,800 likely resulted from colony stimulating factor. She is nontoic and looks much better actually.  -Neutropenia likely secondary to chemotherapy -Medical oncology following, on Granix -Thus far she has not spiked any temperatures  6.  Hypokalemia. -Improved with Kdur.   7-DVT: -SCD's   Code Status: Full Family Communication: multiple family members at bedside updated on patient's condition Disposition Plan: anticipate discharge home in the next 24-48 hours once cleared by surgery   Consultants:  Oncology service (Dr. Marko Plume)   CCS   Procedures:  CT scan shows: Widely metastatic cervical carcinoma with unchanged appearance compared to 04/26/2015. Bulky tumor narrows and likely invades the rectum/distal sigmoid with progressive proximal stool retention. She has hepatic metastasis, multiple intraabdominal nodes, and retroperitoneal adenopathy, on the current CT.   Diverting colostomy on 05/18/15  Antibiotics:  Rocephin 3/12  HPI/Subjective: She appears to be doing well. Reported N/V yesterday  Objective: Filed Vitals:   05/22/15 1004 05/22/15 1436  BP: 109/74 118/62  Pulse: 103 94  Temp: 98.3 F (36.8 C) 98.2 F (36.8 C)  Resp: 16 16    Intake/Output Summary (Last 24 hours) at 05/22/15 1634 Last data filed at 05/22/15 1004  Gross per 24 hour  Intake 1128.75 ml  Output    700 ml  Net 428.75 ml   Filed Weights   05/15/15 1857 05/16/15 0057  Weight: 76.658 kg (169 lb) 74.844 kg (165 lb)    Exam:   General:  Patient is calm and cooperative. Nontoxic apearing  Cardiovascular: S1 and S2, no rubs or  gallops  Respiratory: good air movement, no wheezing  Abdomen: Status post colostomy, mild generalized abdominal pain to palpation on today's exam  Musculoskeletal: no edema, no cyanosis   Data Reviewed: Basic Metabolic Panel:  Recent Labs Lab 05/18/15 0454 05/19/15 0427 05/20/15 0411 05/21/15 0508 05/22/15 0522  NA 140 136 139 138 141  K 3.7 3.6 3.2* 4.4 3.9  CL 103 100* 104 109 109  CO2 25 26 21* 23 23  GLUCOSE 97 91 60* 142* 105*  BUN 11 8 6  <5* <5*  CREATININE 0.54 0.64 0.45 0.48 0.53  CALCIUM 9.0 8.7* 8.7* 8.5* 8.6*   Liver Function Tests:  Recent Labs Lab 05/15/15 2109  AST 35  ALT 36  ALKPHOS 96  BILITOT 1.0  PROT 7.0  ALBUMIN 3.8   CBC:  Recent Labs Lab 05/18/15 0454 05/19/15 0427 05/20/15 0411 05/21/15 0508 05/22/15 0522  WBC 4.5 2.0* 2.1* 8.3 25.8*  NEUTROABS 3.5 0.6* 0.0* 0.9* 20.1*  HGB 13.0 12.0 11.7* 11.7* 11.7*  HCT 38.9 36.0 35.2* 34.9* 34.9*  MCV 82.4 85.5 85.6 84.7 84.3  PLT 126* 121* 128* 150 155   CBG: No results for input(s): GLUCAP in the last 168 hours.  Recent Results (from the past 240 hour(s))  Urine culture     Status: None   Collection Time: 05/15/15  9:23 PM  Result Value Ref Range Status   Specimen Description URINE, CLEAN CATCH  Final   Special Requests NONE  Final   Culture   Final    MULTIPLE SPECIES PRESENT, SUGGEST RECOLLECTION Performed at [facility]    Report Status 05/17/2015 FINAL  Final  Surgical pcr screen     Status: None   Collection Time: 05/18/15  5:09 AM  Result Value Ref Range Status   MRSA, PCR NEGATIVE NEGATIVE Final   Staphylococcus aureus NEGATIVE NEGATIVE Final    Comment:        The Xpert SA Assay (FDA approved for NASAL specimens in patients over 50 years of age), is one component of a comprehensive surveillance program.  Test performance has been validated by [facility] for patients greater than or equal to 38 year old. It is not intended to diagnose infection nor  to guide or monitor treatment.      Studies: No results found.  Scheduled Meds: . acetaminophen  1,000 mg Oral 4 times per day  . acyclovir  200 mg Oral TID  . cefUROXime  500 mg Oral BID WC  . enoxaparin (LOVENOX) injection  40 mg Subcutaneous Q24H  . loratadine  10 mg Oral Daily  . ondansetron (ZOFRAN) IV  4 mg Intravenous Once  . pantoprazole (PROTONIX) IV  40 mg Intravenous Daily   Continuous Infusions: . dextrose 5 %-0.9% nacl with kcl 75 mL/hr at 05/22/15 1253    Principal Problem:   Constipation by outlet obstruction Active Problems:   [employer] Gyne[employer]st[employer] (FIGO) stage IVB malignant neoplasm of cervix ([place])   Urinary tract infection, site not specified   Small bowel obstruction ([place])   Chemotherapy induced neutropenia ([place])    Time spent: 15 minutes    Kelvin Cellar  [facility] Pager [phone]. If 7PM-7AM, please contact night-coverage at [email or web address], password [facility] 05/22/2015, 4:34 PM  LOS: 7 days

## 2015-05-22 NOTE — Progress Notes (Signed)
Patient ID: Katherine Boone, female   DOB: 1945-03-19, 70 y.o.   MRN: [id number] 4 Days Post-Op  Subjective: She feels much better today. Nausea resolved. She has had a lot of output from her colostomy. Denies abdominal pain. Has tolerated full liquids well.  Objective: Vital signs in last 24 hours: Temp:  [98 F (36.7 C)-99.1 F (37.3 C)] 98 F (36.7 C) (03/19 0531) Pulse Rate:  [97-106] 106 (03/19 0531) Resp:  [14-16] 16 (03/19 0531) BP: (116-146)/(59-71) 146/68 mmHg (03/19 0531) SpO2:  [92 %-94 %] 94 % (03/19 0531) Last BM Date: 05/21/15 (stool in ostomy pouch)  Intake/Output from previous day: 03/18 0701 - 03/19 0700 In: 948.8 [P.O.:60; I.V.:888.8] Out: 1700 [Urine:1700] Intake/Output this shift:    General appearance: alert, cooperative and no distress GI: normal findings: soft, non-tender and nondistended  Lab Results:   Recent Labs  05/21/15 0508 05/22/15 0522  WBC 8.3 25.8*  HGB 11.7* 11.7*  HCT 34.9* 34.9*  PLT 150 155   BMET  Recent Labs  05/21/15 0508 05/22/15 0522  NA 138 141  K 4.4 3.9  CL 109 109  CO2 23 23  GLUCOSE 142* 105*  BUN <5* <5*  CREATININE 0.48 0.53  CALCIUM 8.5* 8.6*     Studies/Results: No results found.  Anti-infectives: Anti-infectives    Start     Dose/Rate Route Frequency Ordered Stop   05/21/15 1700  cefUROXime (CEFTIN) tablet 500 mg     500 mg Oral 2 times daily with meals 05/21/15 1510     05/21/15 1200  acyclovir (ZOVIRAX) 200 MG capsule 200 mg     200 mg Oral 3 times daily 05/21/15 1004     05/17/15 1000  cefoTEtan (CEFOTAN) 2 g in dextrose 5 % 50 mL IVPB  Status:  Discontinued     2 g 100 mL/hr over 30 Minutes Intravenous On call to O.R. 05/17/15 0931 05/18/15 1427   05/17/15 0000  cefTRIAXone (ROCEPHIN) 1 g in dextrose 5 % 50 mL IVPB  Status:  Discontinued     1 g 100 mL/hr over 30 Minutes Intravenous Every 24 hours 05/16/15 0047 05/16/15 0048   05/16/15 2300  cefTRIAXone (ROCEPHIN) 1 g in dextrose 5 % 50 mL IVPB   Status:  Discontinued     1 g 100 mL/hr over 30 Minutes Intravenous Every 24 hours 05/16/15 0048 05/21/15 1509   05/15/15 2330  cefTRIAXone (ROCEPHIN) 1 g in dextrose 5 % 50 mL IVPB     1 g 100 mL/hr over 30 Minutes Intravenous  Once 05/15/15 2318 05/16/15 0027      Assessment/Plan: s/p Procedure(s): LAPAROSCOPIC DIVERTED LOOP COLOSTOMY  Pelvic mass, cervical cancer with obstruction Much improved today. Colostomy functioning. Leukocytosis but clinically looks fine and I suspect this is just rebound post chemotherapy. Advance diet as tolerated.   LOS: 7 days    Jasdeep Dejarnett T 05/22/2015

## 2015-05-23 ENCOUNTER — Encounter: Payer: Self-pay | Admitting: Gynecologic Oncology

## 2015-05-23 LAB — CBC WITH DIFFERENTIAL/PLATELET
BASOS PCT: 0 %
Basophils Absolute: 0 10*3/uL (ref 0.0–0.1)
EOS PCT: 1 %
Eosinophils Absolute: 0.2 10*3/uL (ref 0.0–0.7)
HEMATOCRIT: 33.3 % — AB (ref 36.0–46.0)
HEMOGLOBIN: 11 g/dL — AB (ref 12.0–15.0)
Lymphocytes Relative: 11 %
Lymphs Abs: 2.5 10*3/uL (ref 0.7–4.0)
MCH: 28.3 pg (ref 26.0–34.0)
MCHC: 33 g/dL (ref 30.0–36.0)
MCV: 85.6 fL (ref 78.0–100.0)
MONO ABS: 3 10*3/uL — AB (ref 0.1–1.0)
Monocytes Relative: 13 %
NEUTROS PCT: 75 %
Neutro Abs: 17 10*3/uL — ABNORMAL HIGH (ref 1.7–7.7)
PLATELETS: 146 10*3/uL — AB (ref 150–400)
RBC: 3.89 MIL/uL (ref 3.87–5.11)
RDW: 13.6 % (ref 11.5–15.5)
WBC MORPHOLOGY: INCREASED
WBC: 22.7 10*3/uL — ABNORMAL HIGH (ref 4.0–10.5)

## 2015-05-23 MED ORDER — ACETAMINOPHEN 500 MG PO TABS: 500.0000 mg | ORAL_TABLET | Freq: Four times a day (QID) | ORAL | Status: DC | PRN

## 2015-05-23 MED ORDER — CEFUROXIME AXETIL 500 MG PO TABS: 500.0000 mg | ORAL_TABLET | Freq: Two times a day (BID) | ORAL | Status: DC

## 2015-05-23 MED ORDER — OXYCODONE HCL 5 MG PO TABS: 5.0000 mg | ORAL_TABLET | Freq: Four times a day (QID) | ORAL | Status: DC | PRN

## 2015-05-23 MED ORDER — PANTOPRAZOLE SODIUM 40 MG PO TBEC
40.0000 mg | DELAYED_RELEASE_TABLET | Freq: Every day | ORAL | Status: DC
  Administered 2015-05-23: 40 mg via ORAL
  Filled 2015-05-23: qty 1

## 2015-05-23 NOTE — Progress Notes (Signed)
5 Days Post-Op  Subjective: SHE feels fine.  Colostomy is working and she had some stool from her rectum.  She is ready to go home in her opinion and anxious to go.    Objective: Vital signs in last 24 hours: Temp:  [98 F (36.7 C)-98.4 F (36.9 C)] 98.2 F (36.8 C) (03/20 0920) Pulse Rate:  [86-94] 87 (03/20 0920) Resp:  [14-20] 20 (03/20 0920) BP: (115-139)/(58-65) 139/64 mmHg (03/20 0920) SpO2:  [94 %-98 %] 98 % (03/20 0920) Last BM Date: 05/22/15 290 PO  Regular diet  voided x 8 BM x 6 recorded no volume. Afebrile, VSS WBC 22.7K Platelets 146K  H/H=11/33     Intake/Output from previous day: 03/19 0701 - 03/20 0700 In: 2022.5 [P.O.:290; I.V.:1732.5] Out: -  Intake/Output this shift:    General appearance: alert, cooperative and no distress GI: soft, sore, sites all look good.  Ostomy working well.  Bridge in place.  Lab Results:   Recent Labs  05/22/15 0522 05/23/15 0417  WBC 25.8* 22.7*  HGB 11.7* 11.0*  HCT 34.9* 33.3*  PLT 155 146*    BMET  Recent Labs  05/21/15 0508 05/22/15 0522  NA 138 141  K 4.4 3.9  CL 109 109  CO2 23 23  GLUCOSE 142* 105*  BUN <5* <5*  CREATININE 0.48 0.53  CALCIUM 8.5* 8.6*   PT/INR No results for input(s): LABPROT, INR in the last 72 hours.  No results for input(s): AST, ALT, ALKPHOS, BILITOT, PROT, ALBUMIN in the last 168 hours.   Lipase     Component Value Date/Time   LIPASE 37 04/26/2015 1705     Studies/Results: No results found.  Medications: . acyclovir  200 mg Oral TID  . cefUROXime  500 mg Oral BID WC  . enoxaparin (LOVENOX) injection  40 mg Subcutaneous Q24H  . loratadine  10 mg Oral Daily  . ondansetron (ZOFRAN) IV  4 mg Intravenous Once  . pantoprazole  40 mg Oral Daily    Assessment/Plan METASTATIC CERVICAL CANCER WITH OBSTRUCTION S/p LAPAROSCOPIC DIVERTING SIGMOID LOOP COLOSTOMY, [id number], Dr. Leighton Ruff Hx of GERD Remote history of  Migraines Hypolalemia [id number] Neutropenia/thrombcytopenia  Antibiotics: Day 6 Rocephin completed 05/21/15; cefuroxaime tabs/acyclovir PO x 3 days DVT: SCD/Lovenox restarted last PM after discussion with Dr. Marko Plume   Plan:  I think from our standpoint she can go today or tomorrow.  She has not been walking in the hall yet.  If she does well with that i think she can go later today.  Dr. Harlow Asa will see shortly.  i have put follow up in AVS from our standpoint.  I have ask Case manager to see.       LOS: 8 days    Katherine Boone 05/23/2015

## 2015-05-23 NOTE — Discharge Summary (Signed)
Physician Discharge Summary  Katherine Boone [id number] DOB: 20-Apr-1945 DOA: 05/15/2015  PCP: [name], MD  Admit date: 05/15/2015 Discharge date: 05/23/2015  Time spent: 35 minutes  Recommendations for Outpatient Follow-up:  1. Prior to discharge she was set up with home health services  2. She will follow-up at the cancer center with palliative chemotherapy 3. Please follow-up CBC on hospital follow-up visit   Discharge Diagnoses:  Principal Problem:   Constipation by outlet obstruction Active Problems:   International Federation of Gynecology and Obstetrics (FIGO) stage IVB malignant neoplasm of cervix ([place])   Urinary tract infection, site not specified   Small bowel obstruction ([place])   Chemotherapy induced neutropenia ([place])   Discharge Condition: Stable  Diet recommendation: Regular diet  Filed Weights   05/15/15 1857 05/16/15 0057  Weight: 76.658 kg (169 lb) 74.844 kg (165 lb)    History of present illness:  Katherine Boone is a 70 y.o. female with recently diagnosed (04/26/2015) Metastatic Cervical Cancer S/P first Chemo Rx 4 days ago who presents to the ED with complaints of Constipation x 6 days. She has had Nausea mainly and had some but denies any Vomiting. She has had ABD distension and Bloating. She took Miralax without Relief. She denies any Fevers or Chills. A Ct Scan of the ABD/Pelvis revealed widely Metastatic Cervical Cancer unchanged from 04/26/2015. General Surgery was consulted and to see in AM, recommended that patient be made NPO for possible procedure in AM due to Mechanical Obstruction  Hospital Course:  1-Nausea/abd pain and constipation: secondary to SBO/outlet obstruction -A CT scan of abdomen and pelvis performed on 05/15/2015 showed widely metastatic cervical carcinoma with bulky tumor narrowing and invading rectum/distal sigmoid with progressive proximal stool retention. -Gen. surgery was consulted -On 05/18/2015 she  underwent laparoscopic diverging sigmoid loop colostomy, procedure performed by Dr. Marcello Moores -She tolerated procedure well there were no immediate complications -Diet was gradually advanced which she tolerated. -She was having ostomy output  2-Cervical cancer with metastasis and with narrowing/invasion distal rectum/sigmoid colon: -oncology service consulted -actively receiving chemotherapy -As mentioned above patient undergoing laparoscopic diverging sigmoid loop colostomy on 05/18/2015 -Seen by Dr Marko Plume, next chemotherapy scheduled on 06/02/2015  4-UTI: -Initially started empirically started on rocephin -reported frequency and dysuria prior to admission -Urine culture showing multiple species -Remains afebrile, received 4 days of IV ceftriaxone. She was transitioned to ceftin -Plan to discharge on Ceftin 500 mg by mouth twice a day  5. Neutropenia. -Resolved -A.m. Lab on 05/22/2015 work showing white count of 25,800 likely resulted from colony stimulating factor. She was nontoxic and looked much better.  -Neutropenia likely secondary to chemotherapy -Medical oncology following, she was treated with Granix -Thus far she has not spiked any temperatures  6. Hypokalemia. -Resolved after potassium supplementation with Kdur.   Procedures:  CT scan shows: Widely metastatic cervical carcinoma with unchanged appearance compared to 04/26/2015. Bulky tumor narrows and likely invades the rectum/distal sigmoid with progressive proximal stool retention. She has hepatic metastasis, multiple intraabdominal nodes, and retroperitoneal adenopathy, on the current CT.   Diverting colostomy on 05/18/15  Consultations:  Medical oncology  General surgery  Discharge Exam: Filed Vitals:   05/23/15 0516 05/23/15 0920  BP: 125/58 139/64  Pulse: 91 87  Temp: 98.4 F (36.9 C) 98.2 F (36.8 C)  Resp: 14 20     General: Patient is calm and cooperative. Nontoxic apearing, he ambulated down  the hallway  Cardiovascular: S1 and S2, no rubs or gallops  Respiratory: good  air movement, no wheezing  Abdomen: Status post colostomy, mild generalized abdominal pain to palpation on today's exam  Musculoskeletal: no edema, no cyanosis  Discharge Instructions   Discharge Instructions    Call MD for:  difficulty breathing, headache or visual disturbances    Complete by:  As directed      Call MD for:  extreme fatigue    Complete by:  As directed      Call MD for:  hives    Complete by:  As directed      Call MD for:  persistant dizziness or light-headedness    Complete by:  As directed      Call MD for:  persistant nausea and vomiting    Complete by:  As directed      Call MD for:  redness, tenderness, or signs of infection (pain, swelling, redness, odor or green/yellow discharge around incision site)    Complete by:  As directed      Call MD for:  severe uncontrolled pain    Complete by:  As directed      Call MD for:  temperature >100.4    Complete by:  As directed      Call MD for:    Complete by:  As directed      Diet - low sodium heart healthy    Complete by:  As directed      Increase activity slowly    Complete by:  As directed           Current Discharge Medication List    START taking these medications   Details  cefUROXime (CEFTIN) 500 MG tablet Take 1 tablet (500 mg total) by mouth 2 (two) times daily with a meal. Qty: 8 tablet, Refills: 0    oxyCODONE (OXY IR/ROXICODONE) 5 MG immediate release tablet Take 1 tablet (5 mg total) by mouth every 6 (six) hours as needed for moderate pain. Qty: 15 tablet, Refills: 0      CONTINUE these medications which have NOT CHANGED   Details  dexamethasone (DECADRON) 4 MG tablet Take 5 tablets (20mg ) with food 12 hours and 6 hours before taxol Qty: 10 tablet, Refills: 0   Associated Diagnoses: Malignant neoplasm of cervix, unspecified site (HCC)    omeprazole (PRILOSEC) 20 MG capsule Take 1 capsule (20 mg total) by  mouth daily. Qty: 30 capsule, Refills: 3    ondansetron (ZOFRAN) 8 MG tablet Take 1 tablet (8 mg total) by mouth every 8 (eight) hours as needed for nausea or vomiting. Will not make drowsy Qty: 30 tablet, Refills: 0   Associated Diagnoses: Malignant neoplasm of cervix, unspecified site (HCC)    alendronate (FOSAMAX) 70 MG tablet Take 1 tablet (70 mg total) by mouth every 7 (seven) days. Take with a full glass of water on an empty stomach. Qty: 4 tablet, Refills: 11    Calcium Carbonate-Vit D-Min 1200-1000 MG-UNIT CHEW Chew 1 tablet by mouth daily.    Cholecalciferol (VITAMIN D3) 1000 UNITS CAPS Take 1 capsule (1,000 Units total) by mouth daily. Qty: 30 capsule    cyanocobalamin 100 MCG tablet Take 100 mcg by mouth daily. Reported on 04/29/2015    LORazepam (ATIVAN) 0.5 MG tablet Take 1 tablet (0.5 mg total) by mouth every 6 (six) hours as needed (nausea). Or under the tongue. Will make drowsy. Qty: 20 tablet, Refills: 0   Associated Diagnoses: Malignant neoplasm of cervix, unspecified site (HCC)    valACYclovir (VALTREX) 1000 MG tablet Take 1  tablet (1,000 mg total) by mouth daily. Change to prophylactic dose after initial high dose. Qty: 30 tablet, Refills: 1   Associated Diagnoses: Rash      STOP taking these medications     polyethylene glycol (MIRALAX / GLYCOLAX) packet      sennosides-docusate sodium (SENOKOT-S) 8.6-50 MG tablet        No Known Allergies Follow-up Information    Follow up with Rosario Adie., MD. Schedule an appointment as soon as possible for a visit in 2 weeks.   Specialty:  General Surgery   Contact information:   [place] [id number] [phone]        The results of significant diagnostics from this hospitalization (including imaging, microbiology, ancillary and laboratory) are listed below for reference.    Significant Diagnostic Studies: Ct Chest W Contrast  05/10/2015  CLINICAL DATA:  Metastatic poorly  differentiated gynecologic carcinoma, favor cervical origin per biopsy results. Pulmonary nodules visualized at the lung bases on recent CT abdomen study. EXAM: CT CHEST WITH CONTRAST TECHNIQUE: Multidetector CT imaging of the chest was performed during intravenous contrast administration. CONTRAST:  42mL OMNIPAQUE IOHEXOL 300 MG/ML  SOLN COMPARISON:  04/26/2015 CT abdomen/pelvis. FINDINGS: Mediastinum/Nodes: Normal heart size. No pericardial fluid/thickening. Great vessels are normal in course and caliber. No central pulmonary emboli. Heterogeneously hypodense 1.4 cm posterior right thyroid lobe nodule. Normal esophagus. No axillary adenopathy. Mildly enlarged 1.0 cm left level 4 neck node (series 2/ image 11). No pathologically enlarged mediastinal or right hilar nodes. Mildly enlarged 1.1 cm left hilar node (series 2/ image 29). Lungs/Pleura: No pneumothorax. No pleural effusion. There are innumerable (> than 40) pulmonary nodules of various sizes randomly distributed throughout both lungs, with representative nodules including a 0.9 cm right upper lobe nodule (series 5/ image 21), a 1.1 cm basilar right lower lobe nodule (series 5/ image 44) and a 1.1 cm left lower lobe pulmonary nodule (series 5/ image 44). No acute consolidative airspace disease. Mildly thickened parenchymal band in the left upper lobe in keeping with scarring or atelectasis. Upper abdomen: There are 3 scattered hypodense liver masses in the visualized liver, largest 3.7 cm in segment 4A of the left liver lobe. Musculoskeletal: No aggressive appearing focal osseous lesions. Stable mild compression deformity of the superior T11 endplate. Moderate degenerative changes in the thoracic spine. IMPRESSION: 1. Innumerable (> than 40) pulmonary nodules randomly distributed throughout both lungs, largest 1.1 cm, in keeping with pulmonary metastases. 2. Left supraclavicular and left hilar nodal metastases. 3. Re- demonstration of liver metastases. 4.  Right thyroid lobe 1.4 cm pulmonary nodule, for which no further evaluation is recommended. This follows ACR consensus guidelines: Managing Incidental Thyroid Nodules Detected on Imaging: White Paper of the ACR Incidental Thyroid Findings Committee. J Am Coll Radiol 2015; 12:143-150. 5. Stable mild superior T11 vertebral compression deformity. Electronically Signed   By: Ilona Sorrel M.D.   On: 05/10/2015 09:42   Ct Abdomen Pelvis W Contrast  05/15/2015  CLINICAL DATA:  History of metastatic cervical cancer. Left lower quadrant pain. EXAM: CT ABDOMEN AND PELVIS WITH CONTRAST TECHNIQUE: Multidetector CT imaging of the abdomen and pelvis was performed using the standard protocol following bolus administration of intravenous contrast. CONTRAST:  182mL OMNIPAQUE IOHEXOL 300 MG/ML  SOLN COMPARISON:  04/26/2015 FINDINGS: Lower chest and abdominal wall: Pulmonary nodules from metastatic disease. No superimposed acute finding. Hepatobiliary: Hepatic metastases, largest in segment 4B measuring 38 mm. There is associated perfusion anomaly and peripheral biliary  duct obstruction around this largest metastasis. Pancreas: Unremarkable. Spleen: Unremarkable. Adrenals/Urinary Tract: Negative adrenals. No hydronephrosis despite bulky pelvic mass. Left renal cyst. Unremarkable bladder. Reproductive:Bulky multi cystic pelvic mass with grossly similar appearance, again measuring up to 14 cm in maximal dimension. Based on biopsy this is cervical carcinoma. There is extensive contact and probable invasion of the rectum and lower sigmoid. Lower pelvic peritoneal nodules, the largest in the low left pericolic gutter measuring 28 mm and contacting the descending sigmoid junction. Another prominent nodule contacts the cecum. Retroperitoneal malignant adenopathy highest at the right para-aortic station at the level of the renal artery, measuring 30 mm. Stomach/Bowel: Diffuse narrowing of the rectum and distal sigmoid related to tumor  deposits. Above the sigmoid there is diffuse stool. No evidence of perforation or abscess. Vascular/Lymphatic: No acute vascular abnormality. Adenopathy as described above. Musculoskeletal: No acute abnormalities. No metastatic deposit identified. Remote T11 compression fracture. IMPRESSION: Widely metastatic cervical carcinoma with unchanged appearance compared to 04/26/2015. Bulky tumor narrows and likely invades the rectum/distal sigmoid with progressive proximal stool retention. Electronically Signed   By: Monte Fantasia M.D.   On: 05/15/2015 23:09   Ct Abdomen Pelvis W Contrast  04/26/2015  ADDENDUM REPORT: 04/26/2015 20:36 ADDENDUM: I discussed these findings by telephone with Dr. Quay Burow, on call for Dr. Danise Mina, at approximately 2034 hours on 04/26/2015. Electronically Signed   By: Misty Stanley M.D.   On: 04/26/2015 20:36  04/26/2015  CLINICAL DATA:  Abdominal pain with nausea vomiting. EXAM: CT ABDOMEN AND PELVIS WITH CONTRAST TECHNIQUE: Multidetector CT imaging of the abdomen and pelvis was performed using the standard protocol following bolus administration of intravenous contrast. CONTRAST:  140mL OMNIPAQUE IOHEXOL 300 MG/ML  SOLN COMPARISON:  None. FINDINGS: Lower chest: Numerous bilateral pulmonary nodules are evident measuring in the 5-10 mm size range. 10 mm index nodule is seen in the posterior right lung base (image 11 series for. Index nodule in the left posterior lung base measures 11 mm on image 7 series 4. Hepatobiliary: 3.4 cm ill-defined lesion is identified in the medial segment left liver with attenuation higher than would be expected for a simple cyst. 11 mm ill-defined low-density lesion is seen in the inferior right liver on image 25 series 2. 6 mm low-density lesion seen in the medial right liver on image 23. There is no evidence for gallstones, gallbladder wall thickening, or pericholecystic fluid. No intrahepatic or extrahepatic biliary dilation. Pancreas: No focal mass lesion.  No dilatation of the main duct. No intraparenchymal cyst. No peripancreatic edema. Spleen: No splenomegaly. No focal mass lesion. Adrenals/Urinary Tract: No adrenal nodule or mass. 5 mm low-density lesion in the upper pole right kidney is too small to characterize. 14 mm well-defined water density lesion in the interpolar left kidney is likely a cyst. No evidence for hydroureter. The urinary bladder appears normal for the degree of distention. Stomach/Bowel: Stomach is nondistended. No gastric wall thickening. No evidence of outlet obstruction. Duodenum is normally positioned as is the ligament of Treitz. No small bowel wall thickening. No small bowel dilatation. The terminal ileum is normal. The appendix is not visualized, but there is no edema or inflammation in the region of the cecum. Sigmoid colon is tethered to a large a amorphous cystic and solid mass in the central anatomic pelvis. Vascular/Lymphatic: No abdominal aortic aneurysm. No abdominal atherosclerotic calcification. 1.9 x 3.0 cm necrotic right para-aortic lymph node is seen on image 30 series 2. Other necrotic retroperitoneal lymphadenopathy is seen towards the aortic bifurcation.  Small lymph nodes are seen scattered in the pelvic sidewall bilaterally. Reproductive: 10.3 x 13.8 cm mixed cystic and solid mass lesion involves the uterus and extends posteriorly into the cul-de-sac and laterally into both adnexal regions. Other: Scattered necrotic nodules in lymph nodes are seen in the mesentery and omentum. 2.9 cm necrotic nodule is seen in the proximal sigmoid mesocolon (image 63 series 2). No substantial intraperitoneal free fluid Musculoskeletal: Bone windows reveal no worrisome lytic or sclerotic osseous lesions. Superior endplate compression deformity is seen at T11 but there is no evidence of this represents a pathologic fracture. IMPRESSION: 1. Numerous bilateral pulmonary nodules consistent with metastatic disease are associated with liver  lesions, omental nodules, mesenteric nodules, necrotic retroperitoneal lymphadenopathy in a mixed cystic and solid mass in the central pelvis involving the uterus extending into the cul-de-sac and both adnexal regions. Uterine primary (likely endometrial) is favored over an ovarian primary malignancy given the lack of ascites, associated intrahepatic metastases and pulmonary involvement. Electronically Signed: By: Misty Stanley M.D. On: 04/26/2015 19:41   Ct Biopsy  05/04/2015  CLINICAL DATA:  Pelvic mass. Omental, retroperitoneal, hepatic, pulmonary metastases. EXAM: CT GUIDED CORE BIOPSY OF LEFT PELVIC MASS ANESTHESIA/SEDATION: Intravenous Fentanyl and Versed were administered as conscious sedation during continuous monitoring of the patient's level of consciousness and physiological / cardiorespiratory status by the radiology RN, with a total moderate sedation time of eleven minutes. PROCEDURE: The procedure risks, benefits, and alternatives were explained to the patient. Questions regarding the procedure were encouraged and answered. The patient understands and consents to the procedure. Select axial scans through the pelvis were obtained. The left pelvic mass anterior to the distal psoas muscle was localized and an appropriate skin entry site was determined. The operative field was prepped with chlorhexidinein a sterile fashion, and a sterile drape was applied covering the operative field. A sterile gown and sterile gloves were used for the procedure. Local anesthesia was provided with 1% Lidocaine. Under CT fluoroscopic guidance, a 17 gauge trocar needle was advanced to the margin of the lesion. Once needle tip position was confirmed, coaxial 18-gauge core biopsy samples were obtained, submitted in formalin to surgical pathology. The guide needle was removed. Postprocedure scans show no hemorrhage or other apparent complication. COMPLICATIONS: None immediate FINDINGS: The 2.6 cm pelvic mass anterior to the  distal left psoas muscle was again localized. Core biopsy samples obtained under CT fluoroscopic guidance. IMPRESSION: 1. Technically successful CT-guided core biopsy of left pelvic mass. Electronically Signed   By: Lucrezia Europe M.D.   On: 05/04/2015 15:06   Dg Abd 2 Views  05/16/2015  CLINICAL DATA:  No bowel movement for 1 week EXAM: ABDOMEN - 2 VIEW COMPARISON:  05/15/2015 FINDINGS: Scattered large and small bowel gas is noted. Fecal material is noted throughout the colon without definitive obstructive change. This is consistent with constipation and may be related to the patient's underlying cervical mass and apparent narrowing of the colon. Degenerative changes of lumbar spine are seen. No other focal abnormality is noted. IMPRESSION: Increased fecal burden within the colon likely related to degree of constipation secondary to the known pelvic mass. Electronically Signed   By: Inez Catalina M.D.   On: 05/16/2015 10:26   Dg Abd 2 Views  04/26/2015  CLINICAL DATA:  Constipation, vomiting, abdominal pain EXAM: ABDOMEN - 2 VIEW COMPARISON:  No recent studies in [facility] FINDINGS: The colonic stool burden is moderate. There is no small or large bowel obstructive pattern. No free extraluminal gas collections  are observed. There are phleboliths within the pelvis. The lung bases are clear. The bony structures exhibit no acute abnormality. IMPRESSION: There is no evidence of bowel obstruction or ileus. The colonic stool burden is moderate and could reflect clinical constipation. Electronically Signed   By: David  Martinique M.D.   On: 04/26/2015 15:26    Microbiology: Recent Results (from the past 240 hour(s))  Urine culture     Status: None   Collection Time: 05/15/15  9:23 PM  Result Value Ref Range Status   Specimen Description URINE, CLEAN CATCH  Final   Special Requests NONE  Final   Culture   Final    MULTIPLE SPECIES PRESENT, SUGGEST RECOLLECTION Performed at [facility]    Report Status  05/17/2015 FINAL  Final  Surgical pcr screen     Status: None   Collection Time: 05/18/15  5:09 AM  Result Value Ref Range Status   MRSA, PCR NEGATIVE NEGATIVE Final   Staphylococcus aureus NEGATIVE NEGATIVE Final    Comment:        The Xpert SA Assay (FDA approved for NASAL specimens in patients over 43 years of age), is one component of a comprehensive surveillance program.  Test performance has been validated by [facility] for patients greater than or equal to 64 year old. It is not intended to diagnose infection nor to guide or monitor treatment.      Labs: Basic Metabolic Panel:  Recent Labs Lab 05/18/15 0454 05/19/15 0427 05/20/15 0411 05/21/15 0508 05/22/15 0522  NA 140 136 139 138 141  K 3.7 3.6 3.2* 4.4 3.9  CL 103 100* 104 109 109  CO2 25 26 21* 23 23  GLUCOSE 97 91 60* 142* 105*  BUN 11 8 6  <5* <5*  CREATININE 0.54 0.64 0.45 0.48 0.53  CALCIUM 9.0 8.7* 8.7* 8.5* 8.6*   Liver Function Tests: No results for input(s): AST, ALT, ALKPHOS, BILITOT, PROT, ALBUMIN in the last 168 hours. No results for input(s): LIPASE, AMYLASE in the last 168 hours. No results for input(s): AMMONIA in the last 168 hours. CBC:  Recent Labs Lab 05/19/15 0427 05/20/15 0411 05/21/15 0508 05/22/15 0522 05/23/15 0417  WBC 2.0* 2.1* 8.3 25.8* 22.7*  NEUTROABS 0.6* 0.0* 0.9* 20.1* 17.0*  HGB 12.0 11.7* 11.7* 11.7* 11.0*  HCT 36.0 35.2* 34.9* 34.9* 33.3*  MCV 85.5 85.6 84.7 84.3 85.6  PLT 121* 128* 150 155 146*   Cardiac Enzymes: No results for input(s): CKTOTAL, CKMB, CKMBINDEX, TROPONINI in the last 168 hours. BNP: BNP (last 3 results) No results for input(s): BNP in the last 8760 hours.  ProBNP (last 3 results) No results for input(s): PROBNP in the last 8760 hours.  CBG: No results for input(s): GLUCAP in the last 168 hours.     Signed:  Kelvin Cellar MD.  [facility] 05/23/2015, 11:14 AM

## 2015-05-23 NOTE — Care Management Note (Signed)
Case Management Note  Patient Details  Name: KAYLANII FAHERTY MRN: [id number] Date of Birth: [birth year]  Subjective/Objective:    Admitted with obstruction, s/p LAPAROSCOPIC DIVERTED LOOP COLOSTOMY                Action/Plan: Discharge planning, spoke with patient at beside. Chose [facility] for [facility] services, contacted [facility] for referral.    Expected Discharge Date:                  Expected Discharge Plan:  [place]  In-House Referral:  Clinical Social Work  Discharge planning Services  CM Consult  Post Acute Care Choice:  Home Health Choice offered to:  Patient  DME Arranged:  N/A DME Agency:  NA  HH Arranged:  RN [place] Agency:  [place]  Status of Service:  Completed, signed off  Medicare Important Message Given:  Yes Date Medicare IM Given:    Medicare IM give by:    Date Additional Medicare IM Given:    Additional Medicare Important Message give by:     If discussed at [place] of Stay Meetings, dates discussed:    Additional Comments:  Guadalupe Maple, RN 05/23/2015, 11:22 AM [phone]

## 2015-05-23 NOTE — Progress Notes (Signed)
Gynecologic Oncology Multi-Disciplinary Disposition Conference Note  Date of the Conference: May 23, 2015  Patient Name: Katherine Boone  Referring Provider: Dr. Danise Mina Primary GYN Oncologist: Dr. Marti Sleigh  Stage/Disposition:  Stage IV poorly differentiated cervical carcinoma.  Disposition is to palliative intent chemotherapy with cisplatin and taxol with additional of Avastin with the third cycle due to GI diverting procedure performed on 05/18/15.   This Multidisciplinary conference took place involving physicians from [place], [place], Radiation Oncology, Pathology, Radiology along with the Gynecologic Oncology Nurse Practitioner and RN.  Comprehensive assessment of the patient's malignancy, staging, need for surgery, chemotherapy, radiation therapy, and need for further testing were reviewed. Supportive measures, both inpatient and following discharge were also discussed. The recommended plan of care is documented. Greater than 35 minutes were spent correlating and coordinating this patient's care.  Information above reviewed and agreed upon by Dr. Everitt Amber.

## 2015-05-23 NOTE — Discharge Instructions (Signed)
[facility] ______[facility] °LAPAROSCOPIC SURGERY: POST OP INSTRUCTIONS °Always review your discharge instruction sheet given to you by the facility where your surgery was performed. °IF YOU HAVE DISABILITY OR FAMILY LEAVE FORMS, YOU MUST BRING THEM TO THE OFFICE FOR PROCESSING.   °DO NOT GIVE THEM TO YOUR DOCTOR. ° °1. A prescription for pain medication may be given to you upon discharge.  Take your pain medication as prescribed, if needed.  If narcotic pain medicine is not needed, then you may take acetaminophen (Tylenol) or ibuprofen (Advil) as needed. °2. Take your usually prescribed medications unless otherwise directed. °3. If you need a refill on your pain medication, please contact your pharmacy.  They will contact our office to request authorization. Prescriptions will not be filled after 5pm or on week-ends. °4. You should follow a light diet the first few days after arrival home, such as soup and crackers, etc.  Be sure to include lots of fluids daily. °5. Most patients will experience some swelling and bruising in the area of the incisions.  Ice packs will help.  Swelling and bruising can take several days to resolve.  °6. It is common to experience some constipation if taking pain medication after surgery.  Increasing fluid intake and taking a stool softener (such as Colace) will usually help or prevent this problem from occurring.  A mild laxative (Milk of Magnesia or Miralax) should be taken according to package instructions if there are no bowel movements after 48 hours. °7. Unless discharge instructions indicate otherwise, you may remove your bandages 24-48 hours after surgery, and you may shower at that time.  You may have steri-strips (small skin tapes) in place directly over the incision.  These strips should be left on the skin for 7-10 days.  If your surgeon used skin glue on the incision, you may shower in 24 hours.  The glue will flake off over the next 2-3 weeks.  Any sutures or  staples will be removed at the office during your follow-up visit. °8. ACTIVITIES:  You may resume regular (light) daily activities beginning the next day--such as daily self-care, walking, climbing stairs--gradually increasing activities as tolerated.  You may have sexual intercourse when it is comfortable.  Refrain from any heavy lifting or straining until approved by your doctor. °a. You may drive when you are no longer taking prescription pain medication, you can comfortably wear a seatbelt, and you can safely maneuver your car and apply brakes. °b. RETURN TO WORK:  __________________________________________________________ °9. You should see your doctor in the office for a follow-up appointment approximately 2-3 weeks after your surgery.  Make sure that you call for this appointment within a day or two after you arrive home to insure a convenient appointment time. °10. OTHER INSTRUCTIONS: __________________________________________________________________________________________________________________________ __________________________________________________________________________________________________________________________ °WHEN TO CALL YOUR DOCTOR: °1. Fever over 101.0 °2. Inability to urinate °3. Continued bleeding from incision. °4. Increased pain, redness, or drainage from the incision. °5. Increasing abdominal pain ° °The clinic staff is available to answer your questions during regular business hours.  Please don’t hesitate to call and ask to speak to one of the nurses for clinical concerns.  If you have a medical emergency, go to the nearest emergency room or call 911.  A surgeon from [facility] is always on call at the hospital. °[address], [address], [place], [place]  [address] ? [address] [address], [place],    [address] °[phone] ? [phone] ? FAX [phone] °Web site:   [email or web address]  Colostomy Home Guide A colostomy is an opening for stool  to leave your body when a medical condition prevents it from leaving through the usual opening (rectum). During a surgery, a piece of large intestine (colon) is brought through a hole in the abdominal wall. The new opening is called a stoma or ostomy. A bag or pouch fits over the stoma to catch stool and gas. Your stool may be liquid, somewhat pasty, or formed. CARING FOR YOUR STOMA  Normally, the stoma looks a lot like the inside of your cheek: pink, red, and moist. At first it may be swollen, but this swelling will decrease within 6 weeks. Keep the skin around your stoma clean and dry. You can gently wash your stoma and the skin around your stoma in the shower with a clean, soft washcloth. If you develop any skin irritation, your caregiver may give you a stoma powder or ointment to help heal the area. Do not use any products other than those specifically given to you by your caregiver.  Your stoma should not be uncomfortable. If you notice any stinging or burning, your pouch may be leaking, and the skin around your stoma may be coming into contact with stool. This can cause skin irritation. If you notice stinging, replace your pouch with a new one and discard the old one. OSTOMY POUCHES  The pouch that fits over the ostomy can be made up of either 1 or 2 pieces. A one-piece pouch has a skin barrier piece and the pouch itself in one unit. A two-piece pouch has a skin barrier with a separate pouch that snaps on and off of the skin barrier. Either way, you should empty the pouch when it is only  to  full. Do not let more stool or gas build up. This could cause the pouch to leak. Some ostomy bags have a built-in gas release valve. Ostomy deodorizer (5 drops) can be put into the pouch to prevent odor. Some people use ostomy lubricant drops inside the pouch to help the stool slide out of the bag more easily and completely.  EMPTYING YOUR OSTOMY POUCH  You may get lessons on how to empty your pouch from a  wound-ostomy nurse before you leave the hospital. Here are the basic steps:  Wash your hands with soap and water.  Sit far back on the toilet.  Put several pieces of toilet paper into the toilet water. This will prevent splashing as you empty the stool into the toilet bowl.  Unclip or unvelcro the tail end of the pouch.  Unroll the tail and empty stool into the toilet.  Clean the tail with toilet paper.  Reroll the tail, and clip or velcro it closed.  Wash your hands again. CHANGING YOUR OSTOMY POUCH  Change your ostomy pouch about every 3 to 4 days for the first 6 weeks, then every 5 to7 days. Always change the bag sooner if there is any leakage or you begin to notice any discomfort or irritation of the skin around the stoma. When possible, plan to change your ostomy pouch before eating or drinking as this will lessen the chance of stool coming out during the pouch change. A wound-ostomy nurse may teach you how to change your pouch before you leave the hospital. Here are the basic steps:  Lay out your supplies.  Wash your hands with soap and water.  Carefully remove the old pouch.  Wash the stoma and allow it to dry. Men  may be advised to shave any hair around the stoma very carefully. This will make the adhesive stick better.  Use the stoma measuring guide that comes with your pouch set to decide what size hole you will need to cut in the skin barrier piece. Choose the smallest possible size that will hold the stoma but will not touch it.  Use the guide to trace the circle on the back of the skin barrier piece. Cut out the hole.  Hold the skin barrier piece over the stoma to make sure the hole is the correct size.  Remove the adhesive paper backing from the skin barrier piece.  Squeeze stoma paste around the opening of the skin barrier piece.  Clean and dry the skin around the stoma again.  Carefully fit the skin barrier piece over your stoma.  If you are using a two-piece  pouch, snap the pouch onto the skin barrier piece.  Close the tail of the pouch.  Put your hand over the top of the skin barrier piece to help warm it for about 5 minutes, so that it conforms to your body better.  Wash your hands again. DIET TIPS   Continue to follow your usual diet.  Drink about eight 8 oz glasses of water each day.  You can prevent gas by eating slowly and chewing your food thoroughly.  If you feel concerned that you have too much gas, you can cut back on gas-producing foods, such as:  Spicy foods.  Onions and garlic.  Cruciferous vegetables (cabbage, broccoli, cauliflower, Brussels sprouts).  Beans and legumes.  Some cheeses.  Eggs.  Fish.  Bubbly (carbonated) drinks.  Chewing gum. GENERAL TIPS   You can shower with or without the bag in place.  Always keep the bag on if you are bathing or swimming.  If your bag gets wet, you can dry it with a blow-dryer set to cool.  Avoid wearing tight clothing directly over your stoma so that it does not become irritated or bleed. Tight clothing can also prevent stool from draining into the pouch.  It is helpful to always have an extra skin barrier and pouch with you when traveling. Do not leave them anywhere too warm, as parts of them can melt.  Do not let your seat belt rest on your stoma. Try to keep the seat belt either above or below your stoma, or use a tiny pillow to cushion it.  You can still participate in sports, but you should avoid activities in which there is a risk of getting hit in the abdomen.  You can still have sex. It is a good idea to empty your pouch prior to sex. Some people and their partners feel very comfortable seeing the pouch during sex. Others choose to wear lingerie or a T-shirt that covers the device. SEEK IMMEDIATE MEDICAL CARE IF:  You notice a change in the size or color of the stoma, especially if it becomes very red, purple, black, or pale white.  You have bloody stools  or bleeding from the stoma.  You have abdominal pain, nausea, vomiting, or bloating.  There is anything unusual protruding from the stoma.  You have irritation or red skin around the stoma.  No stool is passing from the stoma.  You have diarrhea (requiring more frequent than normal pouch emptying).   This information is not intended to replace advice given to you by your health care provider. Make sure you discuss any questions you have with your  health care provider.   Document Released: 02/22/2003 Document Revised: 05/14/2011 Document Reviewed: 07/19/2010 Elsevier Interactive Patient Education [employer].

## 2015-05-23 NOTE — Consult Note (Signed)
WOC ostomy follow up Stoma type/location: LLQ Colostomy with red rubber cath retention rod.  Stomal assessment/size: 1 1/2" round, os is directed downward.  Abdomen less edematous.  Stoma less edematous Peristomal assessment: Intact.  Less creasing at 3 and 9 o'clock.  Will continue barrier ring Treatment options for stomal/peristomal skin: barrier ring Output Soft brown stool Ostomy pouching: 2pc. 2 3/4" pouch with barrier ring   Education provided: Patient completed pouch change today with minimal assist.  Anticipates home with [facility] today for ongoing education.  Enrolled patient in [place] Start Discharge program: Yes\ Will not follow at this time.  Please re-consult if needed.  Domenic Moras RN BSN [place] Pager [phone]

## 2015-05-23 NOTE — Progress Notes (Signed)
Discharge instructions gone over with pt and companion. Two scripts given to pt and also 3 ostomy bags. Home Health will be called when pt gets home to set up an appointment with them. Pt discharged to companion via wheelchair.

## 2015-05-23 NOTE — Progress Notes (Signed)
  May 23, 2015, 8:17 AM  Hospital day 9 Day 5 post diverting sigmoid loop colostomy Chemotherapy: day 12 cycle 1 carbo taxol  WBC/ ANC well recovered from first chemotherapy, elevation consistent with gCSF used during initial days of hospitalization  Subjective:  Feeling much better, tolerating regular diet since 3-19, good output from ostomy. No nausea, minimal soreness from surgery. Up in room easily. No SOB or cough.  Objective: Vital signs in last 24 hours: Blood pressure 125/58, pulse 91, temperature 98.4 F (36.9 C), temperature source Oral, resp. rate 14, height 5\' 7"  (1.702 m), weight 165 lb (74.844 kg), SpO2 96 %. Awake, alert, sitting up in bed, respirations not labored. Oral mucosa moist and clear. Lungs without wheezes or crackles. Heart RRR. Peripheral IV LUE site ok. No swelling RUE. Abdomen less distended, soft stool in ostomy. LE without pitting edema, cords, tenderness. Repositions easily in bed. Intake/Output from previous day: 03/19 0701 - 03/20 0700 In: 2022.5 [P.O.:290; I.V.:1732.5] Out: -  Intake/Output this shift:  IVF still at 75   Lab Results:  Recent Labs  05/22/15 0522 05/23/15 0417  WBC 25.8* 22.7*  HGB 11.7* 11.0*  HCT 34.9* 33.3*  PLT 155 146*   BMET  Recent Labs  05/21/15 0508 05/22/15 0522  NA 138 141  K 4.4 3.9  CL 109 109  CO2 23 23  GLUCOSE 142* 105*  BUN <5* <5*  CREATININE 0.48 0.53  CALCIUM 8.5* 8.6*    Studies/Results: No results found.   DISCUSSION:  Case presented at gyn oncology multidisciplinary conference this AM: recommendation to change chemo to CDDP taxol for next cycle when stable to proceed from this surgery, then add avastin when out at least 4 weeks from surgery. Patient will need teaching when followed up at office.   Assessment/Plan: 1.post diverting sigmoid colostomy for colonic obstruction: recovering, likely home with [place] soon.  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 Counts recovering from first carbo taxol given 05-12-15 after chemo neutropenia just post op. I will see her back in office within the week, plan to change chemo to CDDP taxol when ok to continue per surgery, then likely add avastin when out far enough from surgery, as this may be somewhat more effective in controlling cervical/ endocervical primary. 3.peripheral IV access somewhat difficult, may need central line for continuing chemo 4. HSV perirectal, continue prophylactic acyclovir 5.poor nutritional status related to bowel obstruction: tolerating po's well now, will continue to address outpatient 6.GERD better with prilosec 7.environmental allergies better with claritin    LIVESAY,LENNIS P

## 2015-05-23 NOTE — Progress Notes (Addendum)
The patient is receiving Protonix by the intravenous route.  Based on criteria approved by the Pharmacy and [place], the medication is being converted to the equivalent oral dose form.  These criteria include: -No Active GI bleeding -Able to tolerate diet of full liquids (or better) or tube feeding -Able to tolerate other medications by the oral or enteral route - Ostomy output, no further nausea noted  If you have any questions about this conversion, please contact the Pharmacy Department (phone [phone]).  Thank you.  Minda Ditto PharmD Pager [phone] 05/23/2015, 8:54 AM

## 2015-05-23 NOTE — Care Management Important Message (Signed)
Important Message  Patient Details IM Letter given to Suzanne/Case Manager to present to Patient Name: REMONIA DIPIAZZA MRN: [id number] Date of Birth: 03/15/1945   Medicare Important Message Given:  Yes    Camillo Flaming 05/23/2015, 11:46 AMImportant Message  Patient Details  Name: ABRI DAYAL MRN: [id number] Date of Birth: 05-17-1945   Medicare Important Message Given:  Yes    Camillo Flaming 05/23/2015, 11:45 AM

## 2015-05-24 DIAGNOSIS — M810 Age-related osteoporosis without current pathological fracture: Secondary | ICD-10-CM | POA: Diagnosis not present

## 2015-05-24 DIAGNOSIS — C539 Malignant neoplasm of cervix uteri, unspecified: Secondary | ICD-10-CM | POA: Diagnosis not present

## 2015-05-24 DIAGNOSIS — K5669 Other intestinal obstruction: Secondary | ICD-10-CM | POA: Diagnosis not present

## 2015-05-24 DIAGNOSIS — K219 Gastro-esophageal reflux disease without esophagitis: Secondary | ICD-10-CM | POA: Diagnosis not present

## 2015-05-24 DIAGNOSIS — N390 Urinary tract infection, site not specified: Secondary | ICD-10-CM | POA: Diagnosis not present

## 2015-05-24 DIAGNOSIS — D701 Agranulocytosis secondary to cancer chemotherapy: Secondary | ICD-10-CM | POA: Diagnosis not present

## 2015-05-24 DIAGNOSIS — Z433 Encounter for attention to colostomy: Secondary | ICD-10-CM | POA: Diagnosis not present

## 2015-05-24 DIAGNOSIS — Z48815 Encounter for surgical aftercare following surgery on the digestive system: Secondary | ICD-10-CM | POA: Diagnosis not present

## 2015-05-24 DIAGNOSIS — T451X5D Adverse effect of antineoplastic and immunosuppressive drugs, subsequent encounter: Secondary | ICD-10-CM | POA: Diagnosis not present

## 2015-05-26 ENCOUNTER — Ambulatory Visit (HOSPITAL_BASED_OUTPATIENT_CLINIC_OR_DEPARTMENT_OTHER): Payer: Commercial Managed Care - HMO | Admitting: Oncology

## 2015-05-26 ENCOUNTER — Encounter: Payer: Self-pay | Admitting: Oncology

## 2015-05-26 ENCOUNTER — Other Ambulatory Visit (HOSPITAL_BASED_OUTPATIENT_CLINIC_OR_DEPARTMENT_OTHER): Payer: Commercial Managed Care - HMO

## 2015-05-26 VITALS — BP 109/69 | HR 84 | Temp 98.0°F | Resp 18 | Ht 67.0 in | Wt 163.6 lb

## 2015-05-26 DIAGNOSIS — K56609 Unspecified intestinal obstruction, unspecified as to partial versus complete obstruction: Secondary | ICD-10-CM

## 2015-05-26 DIAGNOSIS — C7801 Secondary malignant neoplasm of right lung: Secondary | ICD-10-CM | POA: Diagnosis not present

## 2015-05-26 DIAGNOSIS — C801 Malignant (primary) neoplasm, unspecified: Secondary | ICD-10-CM | POA: Diagnosis not present

## 2015-05-26 DIAGNOSIS — D696 Thrombocytopenia, unspecified: Secondary | ICD-10-CM

## 2015-05-26 DIAGNOSIS — C799 Secondary malignant neoplasm of unspecified site: Secondary | ICD-10-CM

## 2015-05-26 DIAGNOSIS — D701 Agranulocytosis secondary to cancer chemotherapy: Secondary | ICD-10-CM

## 2015-05-26 DIAGNOSIS — C7989 Secondary malignant neoplasm of other specified sites: Secondary | ICD-10-CM

## 2015-05-26 DIAGNOSIS — E46 Unspecified protein-calorie malnutrition: Secondary | ICD-10-CM

## 2015-05-26 DIAGNOSIS — Z933 Colostomy status: Secondary | ICD-10-CM

## 2015-05-26 DIAGNOSIS — B009 Herpesviral infection, unspecified: Secondary | ICD-10-CM

## 2015-05-26 DIAGNOSIS — C787 Secondary malignant neoplasm of liver and intrahepatic bile duct: Secondary | ICD-10-CM

## 2015-05-26 DIAGNOSIS — C539 Malignant neoplasm of cervix uteri, unspecified: Secondary | ICD-10-CM

## 2015-05-26 DIAGNOSIS — T451X5A Adverse effect of antineoplastic and immunosuppressive drugs, initial encounter: Secondary | ICD-10-CM

## 2015-05-26 DIAGNOSIS — C7802 Secondary malignant neoplasm of left lung: Secondary | ICD-10-CM | POA: Diagnosis not present

## 2015-05-26 LAB — COMPREHENSIVE METABOLIC PANEL
ALBUMIN: 3.3 g/dL — AB (ref 3.5–5.0)
ALK PHOS: 170 U/L — AB (ref 40–150)
ALT: 109 U/L — ABNORMAL HIGH (ref 0–55)
AST: 71 U/L — AB (ref 5–34)
Anion Gap: 7 mEq/L (ref 3–11)
BILIRUBIN TOTAL: 0.4 mg/dL (ref 0.20–1.20)
BUN: 13.4 mg/dL (ref 7.0–26.0)
CALCIUM: 8.8 mg/dL (ref 8.4–10.4)
CO2: 30 mEq/L — ABNORMAL HIGH (ref 22–29)
Chloride: 106 mEq/L (ref 98–109)
Creatinine: 0.7 mg/dL (ref 0.6–1.1)
EGFR: >90 mL/min/{1.73_m2} (ref >90)
GLUCOSE: 108 mg/dL (ref 70–140)
POTASSIUM: 4.2 meq/L (ref 3.5–5.1)
SODIUM: 143 meq/L (ref 136–145)
TOTAL PROTEIN: 6.8 g/dL (ref 6.4–8.3)

## 2015-05-26 LAB — CBC WITH DIFFERENTIAL/PLATELET
BASO%: 0.6 % (ref 0.0–2.0)
BASOS ABS: 0 10*3/uL (ref 0.0–0.1)
EOS ABS: 0 10*3/uL (ref 0.0–0.5)
EOS%: 0.5 % (ref 0.0–7.0)
HEMATOCRIT: 36.2 % (ref 34.8–46.6)
HEMOGLOBIN: 11.8 g/dL (ref 11.6–15.9)
LYMPH#: 1.5 10*3/uL (ref 0.9–3.3)
LYMPH%: 19 % (ref 14.0–49.7)
MCH: 27.6 pg (ref 25.1–34.0)
MCHC: 32.5 g/dL (ref 31.5–36.0)
MCV: 84.8 fL (ref 79.5–101.0)
MONO#: 0.8 10*3/uL (ref 0.1–0.9)
MONO%: 9.9 % (ref 0.0–14.0)
NEUT%: 70 % (ref 38.4–76.8)
NEUTROS ABS: 5.7 10*3/uL (ref 1.5–6.5)
Platelets: 133 10*3/uL — ABNORMAL LOW (ref 145–400)
RBC: 4.27 10*6/uL (ref 3.70–5.45)
RDW: 13.3 % (ref 11.2–14.5)
WBC: 8.2 10*3/uL (ref 3.9–10.3)

## 2015-05-26 NOTE — Progress Notes (Signed)
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 As disease seemed clinically more consistent with ovarian primary at presentation, she had first chemotherapy with carboplatin taxol on 05-12-15. Patient was hospitalized 3-12 thru 05-23-15 with distal bowel obstruction from pelvic involvement. She had diverting sigmoid loop colostomy by Dr Marcello Moores on 05-18-15. She began granix on day 5 cycle 1 , but was neutropenic by day 8 (05-19-15).  She is to see Dr Marcello Moores on 06-06-15  Patient has been recovering well at home, with good output from ostomy and less abdominal discomfort. She is not using pain medication. Appetite is much better, aware of need for good nutritiion now, drinking fluids well. She denies SOB, cough, chest pain. No nausea or vomiting. She passed small amount of stool from rectum. She has had no fever or symptoms of infection, no LE swelling, no bleeding. Energy is improving. Herpetic rash at buttocks essentially resolved, continues valacyclovir as prophylactic with chemo and chemo steroids planned. No problems now at sites of peripheral IVs in hospital. She has difficulty falling asleep and wakens frequently, ongoing x months, has tried nothing for this. No peripheral neuropathy. Remainder of 10 point Review of Systems negative/ unchanged.   No PAC Flu vaccine 05-06-15  She [place], leaving 4-13 or 06-17-15.  ONCOLOGIC HISTORY Patient was in her usual very good health until new cramping abdominal pain intermittently for last few months. She had some constipation with this, particularly severe when she saw PCP for this reason on 04-26-15. She has had no vaginal bleeding. She had no bowel obstruction by xray and bowels moved with miralax. CT  AP 04-26-15 had 10.3 x 13.8 mixed cystic and solid mass involving uterus and extending posteriorly into cul de sac and into bilateral adnexal regions, sigmoid colon tethered to mass, no ascites, scattered necrotic lymph nodes in mesentery and omentum, no hydroureter, 3 lesions in liver up to 3.4 cm, and numerous bilateral pulmonary nodules in lung bases. CEA was 1.1 and CA 125 was 105; CBC and CMET 04-26-15 were entirely normal. 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 She had CT biopsy of pelvic mass in region of left psoas on 3-1-1 7. Pathology [phone], showed high grade poorly differentiated carcinoma with immunohistochemical stains suggesting cervical primary, which did not fit clinical picture well. CT chest 05-10-15 showed multiple bilateral pulmonary nodules (>40), 3 liver lesions up to 3.7 cm, left supraclavicular node and left hilar nodes. Chemotherapy was begun with first carboplatin and taxol on 05-12-15. Admitted with colonic obstruction 05-15-15, diverting sigmoid loop colostomy 05-18-15. Neutropenic 05-19-15 (day 8 cycle 1) despite granix begun day 5.   Objective:  Vital signs in last 24 hours:  BP 109/69 mmHg  Pulse 84  Temp(Src) 98 F (36.7 C) (Oral)  Resp 18  Ht _0  (1.702 m)  Wt 163 lb 9.6 oz (74.208 kg)  BMI 25.62 kg/m2  SpO2 99% Weight down 3 lbs from 05-06-15 Alert, oriented and appropriate. Ambulatory without difficulty. Looks comfortable, respirations not labored RA.  No alopecia  HEENT:PERRL, sclerae not icteric. Oral mucosa moist without lesions, posterior pharynx clear.  Neck supple. No JVD.  Lymphatics:no cervical,supraclavicular adenopathy Resp: clear to auscultation bilaterally and normal percussion bilaterally  Cardio: regular rate and rhythm. No gallop. GI: soft, nontender, less distended, soft brown stool in ostomy bag. Normally active bowel sounds. Musculoskeletal/ Extremities: without pitting edema,  cords, tenderness Neuro: no peripheral neuropathy. Otherwise nonfocal. PSYCH appropriate mood and affect Skin without rash, ecchymosis, petechiae   Lab Results:  Results for orders placed or performed in visit on 05/26/15  CBC with Differential  Result Value Ref Range   WBC 8.2 3.9 - 10.3 10e3/uL   NEUT# 5.7 1.5 - 6.5 10e3/uL   HGB 11.8 11.6 - 15.9 g/dL   HCT 36.2 34.8 - 46.6 %   Platelets 133 (L) 145 - 400 10e3/uL   MCV 84.8 79.5 - 101.0 fL   MCH 27.6 25.1 - 34.0 pg   MCHC 32.5 31.5 - 36.0 g/dL   RBC 4.27 3.70 - 5.45 10e6/uL   RDW 13.3 11.2 - 14.5 %   lymph# 1.5 0.9 - 3.3 10e3/uL   MONO# 0.8 0.1 - 0.9 10e3/uL   Eosinophils Absolute 0.0 0.0 - 0.5 10e3/uL   Basophils Absolute 0.0 0.0 - 0.1 10e3/uL   NEUT% 70.0 38.4 - 76.8 %   LYMPH% 19.0 14.0 - 49.7 %   MONO% 9.9 0.0 - 14.0 %   EOS% 0.5 0.0 - 7.0 %   BASO% 0.6 0.0 - 2.0 %  Comprehensive metabolic panel  Result Value Ref Range   Sodium 143 136 - 145 mEq/L   Potassium 4.2 3.5 - 5.1 mEq/L   Chloride 106 98 - 109 mEq/L   CO2 30 (H) 22 - 29 mEq/L   Glucose 108 70 - 140 mg/dl   BUN 13.4 7.0 - 26.0 mg/dL   Creatinine 0.7 0.6 - 1.1 mg/dL   Total Bilirubin 0.40 0.20 - 1.20 mg/dL   Alkaline Phosphatase 170 (H) 40 - 150 U/L   AST 71 (H) 5 - 34 U/L   ALT 109 (H) 0 - 55 U/L   Total Protein 6.8 6.4 - 8.3 g/dL   Albumin 3.3 (L) 3.5 - 5.0 g/dL   Calcium 8.8 8.4 - 10.4 mg/dL   Anion Gap 7 3 - 11 mEq/L   EGFR >90 >90 ml/min/1.73 m2     Studies/Results:  CT ABDOMEN AND PELVIS WITH CONTRAST  TECHNIQUE: Multidetector CT imaging of the abdomen and pelvis was performed using the standard protocol following bolus administration of intravenous contrast.  CONTRAST: 167m OMNIPAQUE IOHEXOL 300 MG/ML SOLN  COMPARISON: 04/26/2015  FINDINGS: Lower chest and abdominal wall: Pulmonary nodules from metastatic disease. No superimposed acute finding.  Hepatobiliary: Hepatic metastases, largest in segment 4B measuring 38 mm.  There is associated perfusion anomaly and peripheral biliary duct obstruction around this largest metastasis.  Pancreas: Unremarkable.  Spleen: Unremarkable.  Adrenals/Urinary Tract: Negative adrenals. No hydronephrosis despite bulky pelvic mass. Left renal cyst. Unremarkable bladder.  Reproductive:Bulky multi cystic pelvic mass with grossly similar appearance, again measuring up to 14 cm in maximal dimension. Based on biopsy this is cervical carcinoma. There is extensive contact and probable invasion of the rectum and lower sigmoid. Lower pelvic peritoneal nodules, the largest in the low left pericolic gutter measuring 28 mm and contacting the descending sigmoid junction. Another prominent nodule contacts the cecum. Retroperitoneal malignant adenopathy highest at the right para-aortic station at the level of the renal artery, measuring 30 mm.  Stomach/Bowel: Diffuse narrowing of the rectum and distal sigmoid related to tumor deposits. Above the sigmoid there is diffuse stool. No evidence of perforation or abscess.  Vascular/Lymphatic: No acute vascular abnormality. Adenopathy as described  above.  Musculoskeletal: No acute abnormalities. No metastatic deposit identified. Remote T11 compression fracture.  IMPRESSION: Widely metastatic cervical carcinoma with unchanged appearance compared to 04/26/2015. Bulky tumor narrows and likely invades the rectum/distal sigmoid with progressive proximal stool retention.  GOG doses for this regimen: CDDP 50 mg/m2 with taxol 135 - 175 mg/m2 and avastin 15 mg/kg every 3 weeks. Due to time constraints in infusion, have had to give avastin on separate day .  ' Medications: I have reviewed the patient's current medications. She has ativan available, will try this at hs for now and will let me know if not helpful with sleeping; would try benadryl or restoril if ativan not sufficient.  Refill decadron for taxol, 4 mg #20 for 2  cycles.  DISCUSSION  Reviewed with patient that case was presented at gyn oncology multidisciplinary conference, with recommendation to change chemo to CDDP taxol for next cycle. Then, when healed from surgery and at least 4 weeks out, consider adding avastin to that regimen. She has had teaching for CDDP by RN now, with written information given for oral prehydration. Verbal consent given Teaching for avastin not done today.   Assessment/Plan: 1. 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 Treatment is in attempt to shrink and control disease as best possible, not in curative attempt. Will resume chemotherapy with change to CDDP + taxol every 3 weeks starting ~ 06-09-15 if OK with Dr Marcello Moores when she sees patient again on 06-06-15. Consider adding avastin with subsequent treatments. 2.post diverting sigmoid loop colostomy 05-18-15 for distal bowel obstruction from pelvic malignancy. Ostomy functioning well now, and passing some small amount from rectum, not unexpected. To see Dr Marcello Moores on 06-06-15. 3.peripheral IV access somewhat difficult in hospital, may need PAC and would be best to do this prior to avastin if so. 4.chemo neutropenia: resolved with gCSF, which she will need with subsequent treatments. Taxol aches were not severe, so may prefer neulasta as on pro injector if insurance will allow. 5.nutritional status: protein calorie malnutrition as NPO x a number of days associated with the bowel obstruction. Encouraged her to optimize intake now.  6.perirectal HSV: continue prophylactic acyclovir as she will have chemo and will use steroids with taxol 7.environmental allergies controlled with claritin 8.sleep problems: try hs ativan now 9. Mild thrombocytopenia today likely still chemo related, not on prophylactic heparin now. Follow.  No bleeding. 10.does not have advance  directives: need to continue to address 11. Post appendectomy. No prior gyn surgery. No colonoscopy.   All questions answered and she knows to call prior to next visit if needed. Chemo orders changed to CDDP taxol q 3 weeks, avastin also ordered for cycle beginning ~ 4-27 and neulasta, so that preauthorization can be done. Time spent 40 min including >50% counseling and coordination of care. Cc Dr Marcello Moores with message re timing of next chemo.   Gordy Levan, MD   05/26/2015, 5:56 PM

## 2015-05-26 NOTE — Progress Notes (Signed)
Met with patient after dr. visit. Patient came in and brought proof of income for [place]. Emailed Adonis Huguenin to see if funds are available for [employer]. She is offsite today. Patient has a copy of award as well as expenses it covers along with our outpatient pharmacy contact. Applied for copay assistance through [employer]. Patient approved for assistance. I will send physician form to be signed and faxed to [employer].

## 2015-05-27 ENCOUNTER — Telehealth: Payer: Self-pay | Admitting: Oncology

## 2015-05-27 ENCOUNTER — Encounter: Payer: Self-pay | Admitting: Oncology

## 2015-05-27 DIAGNOSIS — Z48815 Encounter for surgical aftercare following surgery on the digestive system: Secondary | ICD-10-CM | POA: Diagnosis not present

## 2015-05-27 DIAGNOSIS — K219 Gastro-esophageal reflux disease without esophagitis: Secondary | ICD-10-CM | POA: Diagnosis not present

## 2015-05-27 DIAGNOSIS — Z433 Encounter for attention to colostomy: Secondary | ICD-10-CM | POA: Diagnosis not present

## 2015-05-27 DIAGNOSIS — T451X5D Adverse effect of antineoplastic and immunosuppressive drugs, subsequent encounter: Secondary | ICD-10-CM | POA: Diagnosis not present

## 2015-05-27 DIAGNOSIS — C539 Malignant neoplasm of cervix uteri, unspecified: Secondary | ICD-10-CM | POA: Diagnosis not present

## 2015-05-27 DIAGNOSIS — M810 Age-related osteoporosis without current pathological fracture: Secondary | ICD-10-CM | POA: Diagnosis not present

## 2015-05-27 DIAGNOSIS — N390 Urinary tract infection, site not specified: Secondary | ICD-10-CM | POA: Diagnosis not present

## 2015-05-27 DIAGNOSIS — K5669 Other intestinal obstruction: Secondary | ICD-10-CM | POA: Diagnosis not present

## 2015-05-27 DIAGNOSIS — D701 Agranulocytosis secondary to cancer chemotherapy: Secondary | ICD-10-CM | POA: Diagnosis not present

## 2015-05-27 NOTE — Telephone Encounter (Signed)
s.w. pt and advised on April appt.....pt ok and aware °

## 2015-05-27 NOTE — Progress Notes (Signed)
Patient also approved for $400 [employer] grant.

## 2015-05-27 NOTE — Progress Notes (Signed)
Delivered physician form to [facility]) for Dr,Livesay when she returns to complete and return back to me. Emailed award letter and POE to billing.

## 2015-05-28 ENCOUNTER — Other Ambulatory Visit: Payer: Self-pay | Admitting: Oncology

## 2015-05-28 ENCOUNTER — Other Ambulatory Visit: Payer: Self-pay

## 2015-05-28 DIAGNOSIS — C539 Malignant neoplasm of cervix uteri, unspecified: Secondary | ICD-10-CM

## 2015-05-28 DIAGNOSIS — K56609 Unspecified intestinal obstruction, unspecified as to partial versus complete obstruction: Secondary | ICD-10-CM

## 2015-05-28 DIAGNOSIS — C7989 Secondary malignant neoplasm of other specified sites: Secondary | ICD-10-CM | POA: Insufficient documentation

## 2015-05-28 DIAGNOSIS — E46 Unspecified protein-calorie malnutrition: Secondary | ICD-10-CM | POA: Insufficient documentation

## 2015-05-28 DIAGNOSIS — C787 Secondary malignant neoplasm of liver and intrahepatic bile duct: Secondary | ICD-10-CM | POA: Insufficient documentation

## 2015-05-28 HISTORY — DX: Unspecified intestinal obstruction, unspecified as to partial versus complete obstruction: K56.609

## 2015-05-28 MED ORDER — DEXAMETHASONE 4 MG PO TABS: ORAL_TABLET | ORAL | Status: DC

## 2015-05-30 ENCOUNTER — Encounter: Payer: Self-pay | Admitting: Oncology

## 2015-05-30 DIAGNOSIS — K5669 Other intestinal obstruction: Secondary | ICD-10-CM | POA: Diagnosis not present

## 2015-05-30 DIAGNOSIS — N390 Urinary tract infection, site not specified: Secondary | ICD-10-CM | POA: Diagnosis not present

## 2015-05-30 DIAGNOSIS — T451X5D Adverse effect of antineoplastic and immunosuppressive drugs, subsequent encounter: Secondary | ICD-10-CM | POA: Diagnosis not present

## 2015-05-30 DIAGNOSIS — Z48815 Encounter for surgical aftercare following surgery on the digestive system: Secondary | ICD-10-CM | POA: Diagnosis not present

## 2015-05-30 DIAGNOSIS — Z433 Encounter for attention to colostomy: Secondary | ICD-10-CM | POA: Diagnosis not present

## 2015-05-30 DIAGNOSIS — K219 Gastro-esophageal reflux disease without esophagitis: Secondary | ICD-10-CM | POA: Diagnosis not present

## 2015-05-30 DIAGNOSIS — M810 Age-related osteoporosis without current pathological fracture: Secondary | ICD-10-CM | POA: Diagnosis not present

## 2015-05-30 DIAGNOSIS — C539 Malignant neoplasm of cervix uteri, unspecified: Secondary | ICD-10-CM | POA: Diagnosis not present

## 2015-05-30 DIAGNOSIS — D701 Agranulocytosis secondary to cancer chemotherapy: Secondary | ICD-10-CM | POA: Diagnosis not present

## 2015-05-30 NOTE — Progress Notes (Signed)
Received message from [place], treatment plan had changed and needed new form for [employer]. Obtained signature from Dr.Livesay on revised [employer] physician form. Faxed to [employer]. Fax received ok per confirmation sheet.

## 2015-06-03 ENCOUNTER — Telehealth: Payer: Self-pay

## 2015-06-03 NOTE — Telephone Encounter (Signed)
Reviewed decadron pre med at 1130 pm 06-08-15 and 0530 06-09-15 for treatment 1130 06-09-15. Pre hydration for CDDP-5  eight ounce glasses of water the night prior to and repeat the am of CDDP. This will be 4-5 pm and 4-6 am Teach Back Method used.  Ms Lindenmuth wrote instruction down as well. Reviewed the 3 different ways patient can receive injections to stimulate her WBC after chemotherapy 06-09-15. Ms. Lacy stated that she had fairly intense aches in her legs after Taxol last time.  Does take Claritin.   Told her that she can further discuss the options with Dr. Marko Plume at appointment 06-09-15.  The infusion room nurse can show her the On Pro device and review it prior to her making a decision. She know to call Dr. Marko Plume to inform her if Dr. Marcello Moores clears her for chemotherapy 06-09-15 after her follow up 0n 06-06-15.

## 2015-06-03 NOTE — Telephone Encounter (Signed)
-----   Message from Gordy Levan, MD sent at 05/28/2015  2:00 PM EDT ----- Regarding: script and RN phone call prior to 06-09-15 treatment Please send script for decadron premed for taxol, 4 mg  Five tabs=20 mg with food 12 hrs and 6 hrs before taxol #20 for 2 cycles 1 RF  Please talk with her by phone prior to chemo 4-6. Review oral prehydration for CDDP and timing of decadron. Please talk with her about neulasta by on pro injector, which will save her several trips to this office for granix. She did have some taxol aches, but did not seem very severe, so likely would tolerate the full dose neulasta.  Please ask her to call us after she sees Dr Leighton Ruff on 4-3, to let us know if ok for treatment on 4-6. I expect Dr Marcello Moores will also let me know, but having patient call may be quickest way to hear.  thanks

## 2015-06-06 ENCOUNTER — Telehealth: Payer: Self-pay

## 2015-06-06 NOTE — Telephone Encounter (Signed)
Pt called to let us know it is OK to receive chemo on Thursday.

## 2015-06-06 NOTE — Telephone Encounter (Signed)
-----   Message from Gordy Levan, MD sent at 05/28/2015  2:00 PM EDT ----- Regarding: script and RN phone call prior to 06-09-15 treatment Please send script for decadron premed for taxol, 4 mg  Five tabs=20 mg with food 12 hrs and 6 hrs before taxol #20 for 2 cycles 1 RF  Please talk with her by phone prior to chemo 4-6. Review oral prehydration for CDDP and timing of decadron. Please talk with her about neulasta by on pro injector, which will save her several trips to this office for granix. She did have some taxol aches, but did not seem very severe, so likely would tolerate the full dose neulasta.  Please ask her to call us after she sees Dr Leighton Ruff on 4-3, to let us know if ok for treatment on 4-6. I expect Dr Marcello Moores will also let me know, but having patient call may be quickest way to hear.  thanks

## 2015-06-07 ENCOUNTER — Other Ambulatory Visit: Payer: Self-pay | Admitting: Oncology

## 2015-06-07 ENCOUNTER — Telehealth: Payer: Self-pay

## 2015-06-07 NOTE — Telephone Encounter (Signed)
S/w pt. She is worried about the timing b/c chemo is scheduled for 6th and she is going to the beach on 13th. Discussed that she should be on the upswing of feeling poorly after chemo. Discussed prehydration. She mentioned her bottle of h2o are 16 oz and her solo cup is 16 oz and she will work on getting fluid in. Discussed onpro vs neulasta shot at [facility]. She was concerned mostly that some RN's hurt giving the shot and others did not. She was not so much concerned about the next 2-3 days after neulasta shot and body aches. She then mentioned taking decadron and the schedule for that.

## 2015-06-08 ENCOUNTER — Other Ambulatory Visit: Payer: Self-pay | Admitting: Oncology

## 2015-06-08 DIAGNOSIS — Z48815 Encounter for surgical aftercare following surgery on the digestive system: Secondary | ICD-10-CM | POA: Diagnosis not present

## 2015-06-08 DIAGNOSIS — N390 Urinary tract infection, site not specified: Secondary | ICD-10-CM | POA: Diagnosis not present

## 2015-06-08 DIAGNOSIS — K5669 Other intestinal obstruction: Secondary | ICD-10-CM | POA: Diagnosis not present

## 2015-06-08 DIAGNOSIS — M810 Age-related osteoporosis without current pathological fracture: Secondary | ICD-10-CM | POA: Diagnosis not present

## 2015-06-08 DIAGNOSIS — C539 Malignant neoplasm of cervix uteri, unspecified: Secondary | ICD-10-CM | POA: Diagnosis not present

## 2015-06-08 DIAGNOSIS — D701 Agranulocytosis secondary to cancer chemotherapy: Secondary | ICD-10-CM | POA: Diagnosis not present

## 2015-06-08 DIAGNOSIS — T451X5D Adverse effect of antineoplastic and immunosuppressive drugs, subsequent encounter: Secondary | ICD-10-CM | POA: Diagnosis not present

## 2015-06-08 DIAGNOSIS — K219 Gastro-esophageal reflux disease without esophagitis: Secondary | ICD-10-CM | POA: Diagnosis not present

## 2015-06-08 DIAGNOSIS — Z433 Encounter for attention to colostomy: Secondary | ICD-10-CM | POA: Diagnosis not present

## 2015-06-09 ENCOUNTER — Encounter: Payer: Self-pay | Admitting: Oncology

## 2015-06-09 ENCOUNTER — Telehealth: Payer: Self-pay | Admitting: *Deleted

## 2015-06-09 ENCOUNTER — Other Ambulatory Visit: Payer: Self-pay | Admitting: Radiology

## 2015-06-09 ENCOUNTER — Other Ambulatory Visit (HOSPITAL_BASED_OUTPATIENT_CLINIC_OR_DEPARTMENT_OTHER): Payer: Commercial Managed Care - HMO

## 2015-06-09 ENCOUNTER — Ambulatory Visit (HOSPITAL_BASED_OUTPATIENT_CLINIC_OR_DEPARTMENT_OTHER): Payer: Commercial Managed Care - HMO | Admitting: Oncology

## 2015-06-09 ENCOUNTER — Ambulatory Visit: Payer: Commercial Managed Care - HMO

## 2015-06-09 ENCOUNTER — Telehealth: Payer: Self-pay | Admitting: Oncology

## 2015-06-09 ENCOUNTER — Other Ambulatory Visit: Payer: Self-pay | Admitting: Physician Assistant

## 2015-06-09 DIAGNOSIS — C787 Secondary malignant neoplasm of liver and intrahepatic bile duct: Secondary | ICD-10-CM | POA: Diagnosis not present

## 2015-06-09 DIAGNOSIS — D696 Thrombocytopenia, unspecified: Secondary | ICD-10-CM

## 2015-06-09 DIAGNOSIS — K56609 Unspecified intestinal obstruction, unspecified as to partial versus complete obstruction: Secondary | ICD-10-CM

## 2015-06-09 DIAGNOSIS — C7802 Secondary malignant neoplasm of left lung: Secondary | ICD-10-CM

## 2015-06-09 DIAGNOSIS — C7801 Secondary malignant neoplasm of right lung: Secondary | ICD-10-CM | POA: Diagnosis not present

## 2015-06-09 DIAGNOSIS — I878 Other specified disorders of veins: Secondary | ICD-10-CM

## 2015-06-09 DIAGNOSIS — C799 Secondary malignant neoplasm of unspecified site: Secondary | ICD-10-CM

## 2015-06-09 DIAGNOSIS — B009 Herpesviral infection, unspecified: Secondary | ICD-10-CM

## 2015-06-09 DIAGNOSIS — C801 Malignant (primary) neoplasm, unspecified: Secondary | ICD-10-CM | POA: Diagnosis not present

## 2015-06-09 DIAGNOSIS — Z933 Colostomy status: Secondary | ICD-10-CM

## 2015-06-09 DIAGNOSIS — C539 Malignant neoplasm of cervix uteri, unspecified: Secondary | ICD-10-CM

## 2015-06-09 DIAGNOSIS — C7989 Secondary malignant neoplasm of other specified sites: Secondary | ICD-10-CM

## 2015-06-09 LAB — MAGNESIUM: MAGNESIUM: 1.7 mg/dL (ref 1.5–2.5)

## 2015-06-09 LAB — COMPREHENSIVE METABOLIC PANEL
ALK PHOS: 114 U/L (ref 40–150)
ALT: 25 U/L (ref 0–55)
ANION GAP: 10 meq/L (ref 3–11)
AST: 18 U/L (ref 5–34)
Albumin: 3.5 g/dL (ref 3.5–5.0)
BILIRUBIN TOTAL: 0.47 mg/dL (ref 0.20–1.20)
BUN: 9.1 mg/dL (ref 7.0–26.0)
CALCIUM: 9.6 mg/dL (ref 8.4–10.4)
CO2: 25 meq/L (ref 22–29)
CREATININE: 0.8 mg/dL (ref 0.6–1.1)
Chloride: 106 mEq/L (ref 98–109)
EGFR: 78 mL/min/{1.73_m2} — AB (ref >90)
Glucose: 211 mg/dl — ABNORMAL HIGH (ref 70–140)
Potassium: 3.6 mEq/L (ref 3.5–5.1)
Sodium: 140 mEq/L (ref 136–145)
TOTAL PROTEIN: 7.6 g/dL (ref 6.4–8.3)

## 2015-06-09 LAB — CBC WITH DIFFERENTIAL/PLATELET
BASO%: 0.1 % (ref 0.0–2.0)
BASOS ABS: 0 10*3/uL (ref 0.0–0.1)
EOS%: 0 % (ref 0.0–7.0)
Eosinophils Absolute: 0 10*3/uL (ref 0.0–0.5)
HEMATOCRIT: 35.6 % (ref 34.8–46.6)
HGB: 12 g/dL (ref 11.6–15.9)
LYMPH%: 8.2 % — ABNORMAL LOW (ref 14.0–49.7)
MCH: 28.5 pg (ref 25.1–34.0)
MCHC: 33.6 g/dL (ref 31.5–36.0)
MCV: 84.8 fL (ref 79.5–101.0)
MONO#: 0 10*3/uL — ABNORMAL LOW (ref 0.1–0.9)
MONO%: 0.6 % (ref 0.0–14.0)
NEUT%: 91.1 % — ABNORMAL HIGH (ref 38.4–76.8)
NEUTROS ABS: 4 10*3/uL (ref 1.5–6.5)
PLATELETS: 254 10*3/uL (ref 145–400)
RBC: 4.2 10*6/uL (ref 3.70–5.45)
RDW: 15.1 % — AB (ref 11.2–14.5)
WBC: 4.4 10*3/uL (ref 3.9–10.3)
lymph#: 0.4 10*3/uL — ABNORMAL LOW (ref 0.9–3.3)

## 2015-06-09 MED ORDER — TEMAZEPAM 15 MG PO CAPS: 15.0000 mg | ORAL_CAPSULE | Freq: Every evening | ORAL | Status: DC | PRN

## 2015-06-09 NOTE — Patient Instructions (Signed)
Here is some information for when you have your port inserted:  Implanted Port Home Guide An implanted port is a type of central line that is placed under the skin. Central lines are used to provide IV access when treatment or nutrition needs to be given through a person's veins. Implanted ports are used for long-term IV access. An implanted port may be placed because:   You need IV medicine that would be irritating to the small veins in your hands or arms.   You need long-term IV medicines, such as antibiotics.   You need IV nutrition for a long period.   You need frequent blood draws for lab tests.   You need dialysis.  Implanted ports are usually placed in the chest area, but they can also be placed in the upper arm, the abdomen, or the leg. An implanted port has two main parts:   Reservoir. The reservoir is round and will appear as a small, raised area under your skin. The reservoir is the part where a needle is inserted to give medicines or draw blood.   Catheter. The catheter is a thin, flexible tube that extends from the reservoir. The catheter is placed into a large vein. Medicine that is inserted into the reservoir goes into the catheter and then into the vein.  HOW WILL I CARE FOR MY INCISION SITE? Do not get the incision site wet. Bathe or shower as directed by your health care provider.  HOW IS MY PORT ACCESSED? Special steps must be taken to access the port:   Before the port is accessed, a numbing cream can be placed on the skin. This helps numb the skin over the port site.   Your health care provider uses a sterile technique to access the port.  Your health care provider must put on a mask and sterile gloves.  The skin over your port is cleaned carefully with an antiseptic and allowed to dry.  The port is gently pinched between sterile gloves, and a needle is inserted into the port.  Only "non-coring" port needles should be used to access the port. Once the  port is accessed, a blood return should be checked. This helps ensure that the port is in the vein and is not clogged.   If your port needs to remain accessed for a constant infusion, a clear (transparent) bandage will be placed over the needle site. The bandage and needle will need to be changed every week, or as directed by your health care provider.   Keep the bandage covering the needle clean and dry. Do not get it wet. Follow your health care provider's instructions on how to take a shower or bath while the port is accessed.   If your port does not need to stay accessed, no bandage is needed over the port.  WHAT IS FLUSHING? Flushing helps keep the port from getting clogged. Follow your health care provider's instructions on how and when to flush the port. Ports are usually flushed with saline solution or a medicine called heparin. The need for flushing will depend on how the port is used.   If the port is used for intermittent medicines or blood draws, the port will need to be flushed:   After medicines have been given.   After blood has been drawn.   As part of routine maintenance.   If a constant infusion is running, the port may not need to be flushed.  HOW LONG WILL MY PORT STAY IMPLANTED?   The port can stay in for as long as your health care provider thinks it is needed. When it is time for the port to come out, surgery will be done to remove it. The procedure is similar to the one performed when the port was put in.  WHEN SHOULD I SEEK IMMEDIATE MEDICAL CARE? When you have an implanted port, you should seek immediate medical care if:   You notice a bad smell coming from the incision site.   You have swelling, redness, or drainage at the incision site.   You have more swelling or pain at the port site or the surrounding area.   You have a fever that is not controlled with medicine.   This information is not intended to replace advice given to you by your health  care provider. Make sure you discuss any questions you have with your health care provider.   Document Released: 02/19/2005 Document Revised: 12/10/2012 Document Reviewed: 10/27/2012 Elsevier Interactive Patient Education 2016 [employer]  

## 2015-06-09 NOTE — Progress Notes (Signed)
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 After discussion of PICC now vs holding chemo and placing PAC, she prefers to hold treatment today and have PAC placed.  Patient had done all of oral prehydration for anticipated chemotherapy today. She has been discharged by Dr Marcello Moores, as the diverting colostomy is doing well. Dr Marcello Moores was aware of plan for resuming chemo today, ok with that from surgery standpoint. Patient is managing the colostomy without difficulty and has not felt badly this week. She denies SOB or cough, no chest pain. She is eating fairly well, no new or different pain, no LE swelling, no bleeding. She is not sleeping at night (tho can nap during day), ativan did not help and we will try temazepam. Voiding ok. No fever or symptoms of infection. Remainder of 10 point Review of Systems negative.   She has had teaching re PAC by nursing today. She plans to go to beach on 4-13 thru ~ 06-21-15. We will try to get PAC placed prior to leaving town, then reschedule this chemo to be done after the trip.   Flu vaccine 05-06-15  ONCOLOGIC HISTORY Patient was in her usual very good health until new cramping abdominal pain intermittently for last few months. She had some constipation with this, particularly severe when she saw PCP for this reason on 04-26-15. She has had no vaginal bleeding. She had no bowel obstruction by xray and bowels moved with miralax. CT AP 04-26-15 had 10.3 x 13.8 mixed cystic and solid mass involving uterus and extending posteriorly into cul de sac and into bilateral adnexal regions, sigmoid colon tethered to mass, no ascites, scattered necrotic lymph nodes in mesentery and  omentum, no hydroureter, 3 lesions in liver up to 3.4 cm, and numerous bilateral pulmonary nodules in lung bases. CEA was 1.1 and CA 125 was 105; CBC and CMET 04-26-15 were entirely normal. 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 She had CT biopsy of pelvic mass in region of left psoas on 3-1-1 7. Pathology [phone], showed high grade poorly differentiated carcinoma with immunohistochemical stains suggesting cervical primary, which did not fit clinical picture well. CT chest 05-10-15 showed multiple bilateral pulmonary nodules (>40), 3 liver lesions up to 3.7 cm, left supraclavicular node and left hilar nodes. Chemotherapy was begun with first carboplatin and taxol on 05-12-15. Admitted with colonic obstruction 05-15-15, diverting sigmoid loop colostomy 05-18-15. Neutropenic 05-19-15 (day 8 cycle 1) despite granix begun day 5. Chemo changed to CDDP taxol with review of the pathology information, cycle 1 delayed due to inadequate IV access on 06-09-15.     Objective:  Vital signs in last 24 hours: Vitals reviewed on infusion flowsheets.  Alert, oriented and appropriate. Respirations not labored RA. Third RN has just tried 6th attempt at peripheral IV access now, not successful  HEENT:PERRL, sclerae not icteric. Oral mucosa moist without lesions, posterior pharynx clear.  Resp: clear to auscultation bilaterally  Cardio: regular rate and rhythm. No gallop. GI: soft, nontender, not distended, no mass or organomegaly. Normally active bowel sounds. Soft stool in ostomy bag Musculoskeletal/ Extremities: without pitting edema, cords, tenderness Neuro: no peripheral neuropathy. Otherwise nonfocal. PSYCH appropriate mood  and affect Skin without rash, ecchymosis, petechiae  Lab Results:  Results for orders placed or performed in visit on 06/09/15  CBC with Differential  Result Value Ref Range   WBC 4.4 3.9 - 10.3 10e3/uL   NEUT# 4.0 1.5 - 6.5 10e3/uL    HGB 12.0 11.6 - 15.9 g/dL   HCT 35.6 34.8 - 46.6 %   Platelets 254 145 - 400 10e3/uL   MCV 84.8 79.5 - 101.0 fL   MCH 28.5 25.1 - 34.0 pg   MCHC 33.6 31.5 - 36.0 g/dL   RBC 4.20 3.70 - 5.45 10e6/uL   RDW 15.1 (H) 11.2 - 14.5 %   lymph# 0.4 (L) 0.9 - 3.3 10e3/uL   MONO# 0.0 (L) 0.1 - 0.9 10e3/uL   Eosinophils Absolute 0.0 0.0 - 0.5 10e3/uL   Basophils Absolute 0.0 0.0 - 0.1 10e3/uL   NEUT% 91.1 (H) 38.4 - 76.8 %   LYMPH% 8.2 (L) 14.0 - 49.7 %   MONO% 0.6 0.0 - 14.0 %   EOS% 0.0 0.0 - 7.0 %   BASO% 0.1 0.0 - 2.0 %  Comprehensive metabolic panel  Result Value Ref Range   Sodium 140 136 - 145 mEq/L   Potassium 3.6 3.5 - 5.1 mEq/L   Chloride 106 98 - 109 mEq/L   CO2 25 22 - 29 mEq/L   Glucose 211 (H) 70 - 140 mg/dl   BUN 9.1 7.0 - 26.0 mg/dL   Creatinine 0.8 0.6 - 1.1 mg/dL   Total Bilirubin 0.47 0.20 - 1.20 mg/dL   Alkaline Phosphatase 114 40 - 150 U/L   AST 18 5 - 34 U/L   ALT 25 0 - 55 U/L   Total Protein 7.6 6.4 - 8.3 g/dL   Albumin 3.5 3.5 - 5.0 g/dL   Calcium 9.6 8.4 - 10.4 mg/dL   Anion Gap 10 3 - 11 mEq/L   EGFR 78 (L) >90 ml/min/1.73 m2  Magnesium  Result Value Ref Range   Magnesium 1.7 1.5 - 2.5 mg/dl     Studies/Results:  No results found.  Medications: I have reviewed the patient's current medications. Try Restoril 15 mg at hs, can increase to 30 mg at hs if needed (dose increase not discussed with her now) EMLA  DISCUSSION PICC vs PAC as above, rescheduling treatment as above.  Patient had been anxious about chemo side effects while at beach, so overall is pleased with this plan.    Assessment/Plan:  1. 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 Treatment is in attempt to shrink and control disease as best possible, not in curative attempt. Will resume chemotherapy with change to CDDP + taxol every 3 weeks. Consider adding  avastin with subsequent treatments. 2.post diverting sigmoid loop colostomy 05-18-15 for distal bowel obstruction from pelvic malignancy. Ostomy functioning well now, and passing some small amount from rectum, not unexpected. 3.peripheral IV access progressively more difficult and could not be obtained today. Will request PAC by IR  4.chemo neutropenia: resolved with gCSF, which she will need with subsequent treatments. Taxol aches were not severe, so will try neulasta as on pro injector (insurance approved) 5.nutritional status: protein calorie malnutrition as NPO x a number of days associated with the bowel obstruction. Encouraged her to optimize intake now.  6.perirectal HSV: continue prophylactic acyclovir as she will have chemo and will use steroids with taxol 7.environmental allergies controlled with claritin 8.sleep problems:  try hs ativan now 9. Mild thrombocytopenia today likely still chemo related, not on prophylactic heparin now. Follow. No bleeding. 10.does not have advance directives: need to continue to address 11. Post appendectomy. No prior gyn surgery. No colonoscopy.   All questions answered and patient is in agreement with recommendations and plans. PAC by IR requested, chemo and next MD rescheduled. Time spent 25 min including >50% counseling and coordination of care.   Gordy Levan, MD   06/09/2015, 12:31 PM

## 2015-06-09 NOTE — Progress Notes (Signed)
Six attempts by 3 nurses for PIV placement unsuccessful.  Dr Marko Plume notified.  Treatment for today cancelled.  Pt to schedule port placement.

## 2015-06-09 NOTE — Telephone Encounter (Signed)
appt made and avs printed. Message sent to infusion scheduler for 4/20 appt date

## 2015-06-09 NOTE — Telephone Encounter (Signed)
Per staff message and POF I have scheduled appts. Advised scheduler of appts. JMW  

## 2015-06-10 ENCOUNTER — Encounter (HOSPITAL_COMMUNITY): Payer: Self-pay

## 2015-06-10 ENCOUNTER — Ambulatory Visit (HOSPITAL_COMMUNITY)
Admission: RE | Admit: 2015-06-10 | Discharge: 2015-06-10 | Disposition: A | Discharge status: Home / Self Care | Payer: Commercial Managed Care - HMO | Source: Ambulatory Visit | Attending: Oncology | Admitting: Oncology

## 2015-06-10 ENCOUNTER — Other Ambulatory Visit: Payer: Self-pay | Admitting: Oncology

## 2015-06-10 DIAGNOSIS — C799 Secondary malignant neoplasm of unspecified site: Secondary | ICD-10-CM

## 2015-06-10 DIAGNOSIS — C801 Malignant (primary) neoplasm, unspecified: Secondary | ICD-10-CM | POA: Insufficient documentation

## 2015-06-10 DIAGNOSIS — Z452 Encounter for adjustment and management of vascular access device: Secondary | ICD-10-CM | POA: Diagnosis not present

## 2015-06-10 DIAGNOSIS — Z79899 Other long term (current) drug therapy: Secondary | ICD-10-CM | POA: Diagnosis not present

## 2015-06-10 DIAGNOSIS — Z933 Colostomy status: Secondary | ICD-10-CM | POA: Insufficient documentation

## 2015-06-10 LAB — CBC WITH DIFFERENTIAL/PLATELET
BASOS ABS: 0 10*3/uL (ref 0.0–0.1)
Basophils Relative: 0 %
EOS ABS: 0 10*3/uL (ref 0.0–0.7)
EOS PCT: 0 %
HCT: 34 % — ABNORMAL LOW (ref 36.0–46.0)
HEMOGLOBIN: 11.4 g/dL — AB (ref 12.0–15.0)
LYMPHS ABS: 1.6 10*3/uL (ref 0.7–4.0)
Lymphocytes Relative: 10 %
MCH: 28.5 pg (ref 26.0–34.0)
MCHC: 33.5 g/dL (ref 30.0–36.0)
MCV: 85 fL (ref 78.0–100.0)
Monocytes Absolute: 1.2 10*3/uL — ABNORMAL HIGH (ref 0.1–1.0)
Monocytes Relative: 8 %
NEUTROS PCT: 82 %
Neutro Abs: 12.7 10*3/uL — ABNORMAL HIGH (ref 1.7–7.7)
PLATELETS: 285 10*3/uL (ref 150–400)
RBC: 4 MIL/uL (ref 3.87–5.11)
RDW: 15.6 % — ABNORMAL HIGH (ref 11.5–15.5)
WBC: 15.4 10*3/uL — AB (ref 4.0–10.5)

## 2015-06-10 LAB — PROTIME-INR
INR: 1.07 (ref 0.00–1.49)
PROTHROMBIN TIME: 13.7 s (ref 11.6–15.2)

## 2015-06-10 MED ORDER — CEFAZOLIN SODIUM-DEXTROSE 2-4 GM/100ML-% IV SOLN
2.0000 g | INTRAVENOUS | Status: AC
  Administered 2015-06-10: 2 g via INTRAVENOUS
  Filled 2015-06-10: qty 100

## 2015-06-10 MED ORDER — MIDAZOLAM HCL 2 MG/2ML IJ SOLN
INTRAMUSCULAR | Status: AC | PRN
  Administered 2015-06-10: 0.5 mg via INTRAVENOUS
  Administered 2015-06-10: 1 mg via INTRAVENOUS
  Administered 2015-06-10: 0.5 mg via INTRAVENOUS

## 2015-06-10 MED ORDER — MIDAZOLAM HCL 2 MG/2ML IJ SOLN
INTRAMUSCULAR | Status: AC
  Filled 2015-06-10: qty 4

## 2015-06-10 MED ORDER — HEPARIN SOD (PORK) LOCK FLUSH 100 UNIT/ML IV SOLN
INTRAVENOUS | Status: AC | PRN
  Administered 2015-06-10: 500 [IU]

## 2015-06-10 MED ORDER — SODIUM CHLORIDE 0.9 % IV SOLN
INTRAVENOUS | Status: DC
  Administered 2015-06-10: 13:00:00 via INTRAVENOUS

## 2015-06-10 MED ORDER — FENTANYL CITRATE (PF) 100 MCG/2ML IJ SOLN
INTRAMUSCULAR | Status: AC | PRN
  Administered 2015-06-10: 50 ug via INTRAVENOUS
  Administered 2015-06-10 (×2): 25 ug via INTRAVENOUS

## 2015-06-10 MED ORDER — FENTANYL CITRATE (PF) 100 MCG/2ML IJ SOLN
INTRAMUSCULAR | Status: AC
Start: 2015-06-10 — End: 2015-06-11
  Filled 2015-06-10: qty 2

## 2015-06-10 MED ORDER — HEPARIN SOD (PORK) LOCK FLUSH 100 UNIT/ML IV SOLN
INTRAVENOUS | Status: AC
  Filled 2015-06-10: qty 5

## 2015-06-10 MED ORDER — LIDOCAINE HCL 1 % IJ SOLN
INTRAMUSCULAR | Status: AC
  Filled 2015-06-10: qty 20

## 2015-06-10 NOTE — Sedation Documentation (Signed)
Patient denies pain and is resting comfortably.  

## 2015-06-10 NOTE — Discharge Instructions (Signed)

## 2015-06-10 NOTE — H&P (Signed)
Referring Physician(s): Livesay,Lennis P  Supervising Physician: Marybelle Killings  Chief Complaint: "I'm here to have a port put in"  Subjective: Patient familiar to IR service from prior core biopsy of a left pelvic mass on 05/04/15. She has a known history of high-grade poorly differentiated GYN carcinoma and is status post diverting sigmoid loop colostomy on 3/15 for distal bowel obstruction from pelvic malignancy. She also has poor venous access and presents today for Port-A-Cath placement for chemotherapy. She is currently asymptomatic.   Allergies: Review of patient's allergies indicates no known allergies.  Medications: Prior to Admission medications   Medication Sig Start Date End Date Taking? Authorizing Provider  alendronate (FOSAMAX) 70 MG tablet Take 1 tablet (70 mg total) by mouth every 7 (seven) days. Take with a full glass of water on an empty stomach. Patient not taking: Reported on 05/26/2015 07/06/14   [name], MD  Calcium Carbonate-Vit D-Min 1200-1000 MG-UNIT CHEW Chew 1 tablet by mouth daily. Patient not taking: Reported on 05/26/2015 02/12/11   [name], MD  cefUROXime (CEFTIN) 500 MG tablet Take 1 tablet (500 mg total) by mouth 2 (two) times daily with a meal. 05/23/15   Kelvin Cellar, MD  Cholecalciferol (VITAMIN D3) 1000 UNITS CAPS Take 1 capsule (1,000 Units total) by mouth daily. Patient not taking: Reported on 05/26/2015 08/02/14   [name], MD  cyanocobalamin 100 MCG tablet Take 100 mcg by mouth daily. Reported on 05/26/2015    Historical Provider, MD  dexamethasone (DECADRON) 4 MG tablet Take 5 tablets (20mg ) with food 12 hours and 6 hours before taxol 05/28/15   Lennis P Livesay, MD  LORazepam (ATIVAN) 0.5 MG tablet Take 1 tablet (0.5 mg total) by mouth every 6 (six) hours as needed (nausea). Or under the tongue. Will make drowsy. 05/09/15   Lennis Marion Downer, MD  omeprazole (PRILOSEC) 20 MG capsule Take 1 capsule (20 mg total) by mouth  daily. 04/28/15   [name], MD  ondansetron (ZOFRAN) 8 MG tablet Take 1 tablet (8 mg total) by mouth every 8 (eight) hours as needed for nausea or vomiting. Will not make drowsy 05/09/15   Lennis Marion Downer, MD  oxyCODONE (OXY IR/ROXICODONE) 5 MG immediate release tablet Take 1 tablet (5 mg total) by mouth every 6 (six) hours as needed for moderate pain. 05/23/15   Kelvin Cellar, MD  temazepam (RESTORIL) 15 MG capsule Take 1 capsule (15 mg total) by mouth at bedtime as needed for sleep. 06/09/15   Lennis Marion Downer, MD  valACYclovir (VALTREX) 1000 MG tablet Take 1 tablet (1,000 mg total) by mouth daily. Change to prophylactic dose after initial high dose. 05/12/15   Susanne Borders, NP     Vital Signs: BP 119/65 mmHg  Pulse 65  Temp(Src) 97.6 F (36.4 C) (Oral)  Resp 18  SpO2 100%  Physical Exam awake, alert. Chest clear to auscultation bilaterally. Heart with regular rate and rhythm. Abdomen soft, intact colostomy, positive bowel sounds, nontender. Lower extremities with no significant edema  Imaging: No results found.  Labs:  CBC:  Recent Labs  05/22/15 0522 05/23/15 0417 05/26/15 1426 06/09/15 1051  WBC 25.8* 22.7* 8.2 4.4  HGB 11.7* 11.0* 11.8 12.0  HCT 34.9* 33.3* 36.2 35.6  PLT 155 146* 133* 254    COAGS:  Recent Labs  05/04/15 1130 05/18/15 0454  INR 1.03 1.08  APTT 28 25    BMP:  Recent Labs  05/19/15 0427 05/20/15 0411 05/21/15 0508 05/22/15 0522 05/26/15  1426 06/09/15 1051  NA 136 139 138 141 143 140  K 3.6 3.2* 4.4 3.9 4.2 3.6  CL 100* 104 109 109  --   --   CO2 26 21* 23 23 30* 25  GLUCOSE 91 60* 142* 105* 108 211*  BUN 8 6 <5* <5* 13.4 9.1  CALCIUM 8.7* 8.7* 8.5* 8.6* 8.8 9.6  CREATININE 0.64 0.45 0.48 0.53 0.7 0.8  GFRNONAA >60 >60 >60 >60  --   --   GFRAA >60 >60 >60 >60  --   --     LIVER FUNCTION TESTS:  Recent Labs  05/06/15 0835 05/15/15 2109 05/26/15 1426 06/09/15 1051  BILITOT 0.46 1.0 0.40 0.47  AST 19 35 71* 18  ALT  12 36 109* 25  ALKPHOS 121 96 170* 114  PROT 7.3 7.0 6.8 7.6  ALBUMIN 3.7 3.8 3.3* 3.5    Assessment and Plan: Patient with recently diagnosed high-grade poorly differentiated GYN carcinoma; poor venous access. Request made for Port-A-Cath placement for chemotherapy.  She presents today for the above procedure. Risks and benefits discussed with the patient including, but not limited to bleeding, infection, pneumothorax, or fibrin sheath development and need for additional procedures.All of the patient's questions were answered, patient is agreeable to proceed.Consent signed and in chart. WBC today is elevated at 15.4. Pt recently had decadron and is AF.       Electronically Signed: D. Rowe Robert 06/10/2015, 1:30 PM   I spent a total of 20 minutes at the the patient's bedside AND on the patient's hospital floor or unit, greater than 50% of which was counseling/coordinating care for port a cath placement

## 2015-06-10 NOTE — Procedures (Signed)
RIJV PAC SVC RA No comp/EBL 

## 2015-06-11 DIAGNOSIS — Z933 Colostomy status: Secondary | ICD-10-CM | POA: Insufficient documentation

## 2015-06-11 DIAGNOSIS — K56609 Unspecified intestinal obstruction, unspecified as to partial versus complete obstruction: Secondary | ICD-10-CM | POA: Insufficient documentation

## 2015-06-11 DIAGNOSIS — I878 Other specified disorders of veins: Secondary | ICD-10-CM | POA: Insufficient documentation

## 2015-06-14 ENCOUNTER — Other Ambulatory Visit: Payer: Self-pay

## 2015-06-14 NOTE — Patient Outreach (Signed)
[facility]) Care Management  06/14/2015  Katherine Boone May 01, 1945 [id number]   Telephone Screen  Referral Date: 06/13/15 Referral Source: [employer]) Referral Reason: "disease and symptom management"  Outreach attempt #1 to patient. No answer at present. RN CM left HIPAA compliant voicemail message along with contact info.   Plan: RN CM will make outreach attempt call to patient within a week.   Enzo Montgomery, RN,BSN,CCM [place] [facility] Telephonic Care Management Coordinator Direct Phone: [phone] Toll Free: [phone] Fax: [phone]

## 2015-06-15 DIAGNOSIS — K5669 Other intestinal obstruction: Secondary | ICD-10-CM | POA: Diagnosis not present

## 2015-06-15 DIAGNOSIS — T451X5D Adverse effect of antineoplastic and immunosuppressive drugs, subsequent encounter: Secondary | ICD-10-CM | POA: Diagnosis not present

## 2015-06-15 DIAGNOSIS — Z48815 Encounter for surgical aftercare following surgery on the digestive system: Secondary | ICD-10-CM | POA: Diagnosis not present

## 2015-06-15 DIAGNOSIS — N390 Urinary tract infection, site not specified: Secondary | ICD-10-CM | POA: Diagnosis not present

## 2015-06-15 DIAGNOSIS — M810 Age-related osteoporosis without current pathological fracture: Secondary | ICD-10-CM | POA: Diagnosis not present

## 2015-06-15 DIAGNOSIS — D701 Agranulocytosis secondary to cancer chemotherapy: Secondary | ICD-10-CM | POA: Diagnosis not present

## 2015-06-15 DIAGNOSIS — Z433 Encounter for attention to colostomy: Secondary | ICD-10-CM | POA: Diagnosis not present

## 2015-06-15 DIAGNOSIS — K219 Gastro-esophageal reflux disease without esophagitis: Secondary | ICD-10-CM | POA: Diagnosis not present

## 2015-06-15 DIAGNOSIS — C539 Malignant neoplasm of cervix uteri, unspecified: Secondary | ICD-10-CM | POA: Diagnosis not present

## 2015-06-16 ENCOUNTER — Ambulatory Visit: Payer: Commercial Managed Care - HMO | Admitting: Oncology

## 2015-06-16 ENCOUNTER — Other Ambulatory Visit: Payer: Commercial Managed Care - HMO

## 2015-06-19 ENCOUNTER — Other Ambulatory Visit: Payer: Self-pay | Admitting: Oncology

## 2015-06-20 ENCOUNTER — Other Ambulatory Visit: Payer: Self-pay

## 2015-06-20 ENCOUNTER — Ambulatory Visit: Payer: Self-pay

## 2015-06-20 NOTE — Patient Outreach (Signed)
[facility]) Care Management  06/20/2015  Katherine Boone April 06, 1945 [id number]    Telephone Screen  Referral Date: 06/13/15 Referral Source: [employer]) Referral Reason: "disease and symptom management"   Outreach attempt #2 to patient. No answer- HIPAA compliant voicemail message left.    Plan: RN CM will make outreach call to patient within a week.  Enzo Montgomery, RN,BSN,CCM [place] [facility] Telephonic Care Management Coordinator Direct Phone: [phone] Toll Free: [phone] Fax: [phone]

## 2015-06-23 ENCOUNTER — Ambulatory Visit (HOSPITAL_BASED_OUTPATIENT_CLINIC_OR_DEPARTMENT_OTHER): Payer: Commercial Managed Care - HMO | Admitting: Oncology

## 2015-06-23 ENCOUNTER — Other Ambulatory Visit (HOSPITAL_BASED_OUTPATIENT_CLINIC_OR_DEPARTMENT_OTHER): Payer: Commercial Managed Care - HMO

## 2015-06-23 ENCOUNTER — Encounter: Payer: Self-pay | Admitting: Oncology

## 2015-06-23 ENCOUNTER — Ambulatory Visit (HOSPITAL_BASED_OUTPATIENT_CLINIC_OR_DEPARTMENT_OTHER): Payer: Commercial Managed Care - HMO

## 2015-06-23 VITALS — BP 105/62 | HR 67 | Temp 97.9°F | Resp 18

## 2015-06-23 DIAGNOSIS — E46 Unspecified protein-calorie malnutrition: Secondary | ICD-10-CM

## 2015-06-23 DIAGNOSIS — C801 Malignant (primary) neoplasm, unspecified: Secondary | ICD-10-CM

## 2015-06-23 DIAGNOSIS — C7801 Secondary malignant neoplasm of right lung: Secondary | ICD-10-CM

## 2015-06-23 DIAGNOSIS — Z5111 Encounter for antineoplastic chemotherapy: Secondary | ICD-10-CM | POA: Diagnosis not present

## 2015-06-23 DIAGNOSIS — C539 Malignant neoplasm of cervix uteri, unspecified: Secondary | ICD-10-CM

## 2015-06-23 DIAGNOSIS — C787 Secondary malignant neoplasm of liver and intrahepatic bile duct: Secondary | ICD-10-CM

## 2015-06-23 DIAGNOSIS — C799 Secondary malignant neoplasm of unspecified site: Secondary | ICD-10-CM

## 2015-06-23 DIAGNOSIS — B009 Herpesviral infection, unspecified: Secondary | ICD-10-CM

## 2015-06-23 DIAGNOSIS — C7802 Secondary malignant neoplasm of left lung: Secondary | ICD-10-CM

## 2015-06-23 DIAGNOSIS — D701 Agranulocytosis secondary to cancer chemotherapy: Secondary | ICD-10-CM

## 2015-06-23 DIAGNOSIS — Z933 Colostomy status: Secondary | ICD-10-CM

## 2015-06-23 DIAGNOSIS — C7989 Secondary malignant neoplasm of other specified sites: Secondary | ICD-10-CM

## 2015-06-23 DIAGNOSIS — Z95828 Presence of other vascular implants and grafts: Secondary | ICD-10-CM

## 2015-06-23 DIAGNOSIS — T451X5A Adverse effect of antineoplastic and immunosuppressive drugs, initial encounter: Secondary | ICD-10-CM

## 2015-06-23 LAB — CBC WITH DIFFERENTIAL/PLATELET
BASO%: 0.3 % (ref 0.0–2.0)
BASOS ABS: 0 10*3/uL (ref 0.0–0.1)
EOS%: 0 % (ref 0.0–7.0)
Eosinophils Absolute: 0 10*3/uL (ref 0.0–0.5)
HEMATOCRIT: 39.7 % (ref 34.8–46.6)
HGB: 13 g/dL (ref 11.6–15.9)
LYMPH#: 0.4 10*3/uL — AB (ref 0.9–3.3)
LYMPH%: 4.9 % — AB (ref 14.0–49.7)
MCH: 28.5 pg (ref 25.1–34.0)
MCHC: 32.7 g/dL (ref 31.5–36.0)
MCV: 87.1 fL (ref 79.5–101.0)
MONO#: 0 10*3/uL — ABNORMAL LOW (ref 0.1–0.9)
MONO%: 0.3 % (ref 0.0–14.0)
NEUT#: 7.9 10*3/uL — ABNORMAL HIGH (ref 1.5–6.5)
NEUT%: 94.5 % — AB (ref 38.4–76.8)
Platelets: 190 10*3/uL (ref 145–400)
RBC: 4.56 10*6/uL (ref 3.70–5.45)
RDW: 16.2 % — ABNORMAL HIGH (ref 11.2–14.5)
WBC: 8.4 10*3/uL (ref 3.9–10.3)

## 2015-06-23 LAB — COMPREHENSIVE METABOLIC PANEL
ALT: 27 U/L (ref 0–55)
AST: 20 U/L (ref 5–34)
Albumin: 3.5 g/dL (ref 3.5–5.0)
Alkaline Phosphatase: 131 U/L (ref 40–150)
Anion Gap: 11 mEq/L (ref 3–11)
BUN: 8.5 mg/dL (ref 7.0–26.0)
CALCIUM: 9.6 mg/dL (ref 8.4–10.4)
CHLORIDE: 107 meq/L (ref 98–109)
CO2: 23 mEq/L (ref 22–29)
Creatinine: 0.8 mg/dL (ref 0.6–1.1)
EGFR: 74 mL/min/{1.73_m2} — ABNORMAL LOW (ref >90)
Glucose: 253 mg/dl — ABNORMAL HIGH (ref 70–140)
POTASSIUM: 4.1 meq/L (ref 3.5–5.1)
Sodium: 141 mEq/L (ref 136–145)
Total Bilirubin: 0.59 mg/dL (ref 0.20–1.20)
Total Protein: 7.6 g/dL (ref 6.4–8.3)

## 2015-06-23 LAB — MAGNESIUM: MAGNESIUM: 1.7 mg/dL (ref 1.5–2.5)

## 2015-06-23 MED ORDER — PALONOSETRON HCL INJECTION 0.25 MG/5ML
INTRAVENOUS | Status: AC
  Filled 2015-06-23: qty 5

## 2015-06-23 MED ORDER — POTASSIUM CHLORIDE 2 MEQ/ML IV SOLN
Freq: Once | INTRAVENOUS | Status: AC
  Administered 2015-06-23: 09:00:00 via INTRAVENOUS
  Filled 2015-06-23: qty 10

## 2015-06-23 MED ORDER — HEPARIN SOD (PORK) LOCK FLUSH 100 UNIT/ML IV SOLN
500.0000 [IU] | Freq: Once | INTRAVENOUS | Status: AC | PRN
  Administered 2015-06-23: 500 [IU]
  Filled 2015-06-23: qty 5

## 2015-06-23 MED ORDER — FAMOTIDINE IN NACL 20-0.9 MG/50ML-% IV SOLN
20.0000 mg | Freq: Once | INTRAVENOUS | Status: AC
  Administered 2015-06-23: 20 mg via INTRAVENOUS

## 2015-06-23 MED ORDER — SODIUM CHLORIDE 0.9 % IV SOLN
Freq: Once | INTRAVENOUS | Status: AC
  Administered 2015-06-23: 11:00:00 via INTRAVENOUS

## 2015-06-23 MED ORDER — SODIUM CHLORIDE 0.9 % IV SOLN
Freq: Once | INTRAVENOUS | Status: AC
  Administered 2015-06-23: 11:00:00 via INTRAVENOUS
  Filled 2015-06-23: qty 5

## 2015-06-23 MED ORDER — LIDOCAINE-PRILOCAINE 2.5-2.5 % EX CREA: TOPICAL_CREAM | CUTANEOUS | Status: DC

## 2015-06-23 MED ORDER — ACETAMINOPHEN 325 MG PO TABS
ORAL_TABLET | ORAL | Status: AC
  Filled 2015-06-23: qty 2

## 2015-06-23 MED ORDER — ACETAMINOPHEN 325 MG PO TABS
650.0000 mg | ORAL_TABLET | Freq: Once | ORAL | Status: AC
  Administered 2015-06-23: 650 mg via ORAL

## 2015-06-23 MED ORDER — FAMOTIDINE IN NACL 20-0.9 MG/50ML-% IV SOLN
INTRAVENOUS | Status: AC
  Filled 2015-06-23: qty 50

## 2015-06-23 MED ORDER — FUROSEMIDE 10 MG/ML IJ SOLN
INTRAMUSCULAR | Status: AC
  Filled 2015-06-23: qty 2

## 2015-06-23 MED ORDER — PROCHLORPERAZINE MALEATE 10 MG PO TABS: 10.0000 mg | ORAL_TABLET | Freq: Four times a day (QID) | ORAL | Status: DC | PRN

## 2015-06-23 MED ORDER — PACLITAXEL CHEMO INJECTION 300 MG/50ML: 135.0000 mg/m2 | Freq: Once | INTRAVENOUS | Status: DC

## 2015-06-23 MED ORDER — DIPHENHYDRAMINE HCL 50 MG/ML IJ SOLN
INTRAMUSCULAR | Status: AC
  Filled 2015-06-23: qty 1

## 2015-06-23 MED ORDER — SODIUM CHLORIDE 0.9% FLUSH
10.0000 mL | INTRAVENOUS | Status: DC | PRN
  Administered 2015-06-23: 10 mL
  Filled 2015-06-23: qty 10

## 2015-06-23 MED ORDER — PALONOSETRON HCL INJECTION 0.25 MG/5ML
0.2500 mg | Freq: Once | INTRAVENOUS | Status: AC
  Administered 2015-06-23: 0.25 mg via INTRAVENOUS

## 2015-06-23 MED ORDER — PACLITAXEL CHEMO INJECTION 300 MG/50ML
135.0000 mg/m2 | Freq: Once | INTRAVENOUS | Status: AC
  Administered 2015-06-23: 252 mg via INTRAVENOUS
  Filled 2015-06-23: qty 42

## 2015-06-23 MED ORDER — FUROSEMIDE 10 MG/ML IJ SOLN: 20.0000 mg | Freq: Once | INTRAMUSCULAR | Status: DC

## 2015-06-23 MED ORDER — DIPHENHYDRAMINE HCL 50 MG/ML IJ SOLN
50.0000 mg | Freq: Once | INTRAMUSCULAR | Status: AC
  Administered 2015-06-23: 50 mg via INTRAVENOUS

## 2015-06-23 MED ORDER — PEGFILGRASTIM 6 MG/0.6ML ~~LOC~~ PSKT
6.0000 mg | PREFILLED_SYRINGE | Freq: Once | SUBCUTANEOUS | Status: AC
  Administered 2015-06-23: 6 mg via SUBCUTANEOUS
  Filled 2015-06-23: qty 0.6

## 2015-06-23 MED ORDER — SODIUM CHLORIDE 0.9 % IV SOLN
50.0000 mg/m2 | Freq: Once | INTRAVENOUS | Status: AC
  Administered 2015-06-23: 94 mg via INTRAVENOUS
  Filled 2015-06-23: qty 94

## 2015-06-23 NOTE — Progress Notes (Signed)
OFFICE PROGRESS NOTE   June 23, 2015   Physicians: D.ClarkePearson, [name] (PCP), Leighton Ruff  INTERVAL HISTORY:  Patient is seen, together with friend, in infusion area as she is receiving first CDDP taxol for advanced gyn carcinoma, with pathology most consistent with cervical or endocervical primary. Plan is to add avastin when she is further out from recent diverting colostomy (05-18-15) and PAC placement (06-10-15) This chemo was delayed from 06-09-15 as peripheral IV access could not be obtained; patient then took planned beach trip over Easter.   Note first chemo was carbo taxol on 05-12-15. She was neutropenic by day 8 despite granix begun day 5, that also at time of urgent diverting colostomy.  She had no problems with PAC placement, some minimal soreness at site after placement which has resolved. The colostomy has functioned well. Energy was fine at [employer], very active. Appetite has been excellent, no nausea. She has had no abdominal or pelvic pain, bladder ok, no bleeding, no LE swelling, no fever or symptoms of infection.  Did all of oral prehydration as instructed. Remainder of 10 point Review of Systems negative.  Flu vaccine 05-06-15 PAC by IR 06-10-15  ONCOLOGIC HISTORY Patient was in her usual very good health until new cramping abdominal pain intermittently for last few months. She had some constipation with this, particularly severe when she saw PCP for this reason on 04-26-15. She has had no vaginal bleeding. She had no bowel obstruction by xray and bowels moved with miralax. CT AP 04-26-15 had 10.3 x 13.8 mixed cystic and solid mass involving uterus and extending posteriorly into cul de sac and into bilateral adnexal regions, sigmoid colon tethered to mass, no ascites, scattered necrotic lymph nodes in mesentery and omentum, no hydroureter, 3 lesions in liver up to 3.4 cm, and numerous bilateral pulmonary nodules in lung bases. CEA was 1.1 and CA 125 was 105; CBC and CMET  04-26-15 were entirely normal. 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 She had CT biopsy of pelvic mass in region of left psoas on 3-1-1 7. Pathology [phone], showed high grade poorly differentiated carcinoma with immunohistochemical stains suggesting cervical primary, which did not fit clinical picture well. CT chest 05-10-15 showed multiple bilateral pulmonary nodules (>40), 3 liver lesions up to 3.7 cm, left supraclavicular node and left hilar nodes. Chemotherapy was begun with first carboplatin and taxol on 05-12-15. Admitted with colonic obstruction 05-15-15, diverting sigmoid loop colostomy 05-18-15. Neutropenic 05-19-15 (day 8 cycle 1) despite granix begun day 5. Chemo changed to CDDP taxol with review of the pathology information, cycle 1 delayed due to inadequate IV access on 06-09-15.   Objective:  Vital signs in last 24 hours: 129/62, 85 regular, 18 not labored RA, 97.9, 96% sat. Looks comfortable in recliner with IVF in process. Alert, oriented and appropriate. Ambulatory into office without difficulty. Respirations not labored RA.   HEENT:PERRL, sclerae not icteric. Oral mucosa moist without lesions, posterior pharynx clear.  Lymphatics:no suraclavicular adenopathy Resp: clear to auscultation bilaterally  Cardio: regular rate and rhythm. No gallop. GI: soft, nontender, not distended. Normally active bowel sounds. Ostomy site ok Musculoskeletal/ Extremities: without pitting edema, cords, tenderness Neuro: no peripheral neuropathy. Otherwise nonfocal. PSYCH appropriate mood and affect Skin without rash, ecchymosis, petechiae Portacath-without erythema or tenderness, infusing easily  Lab Results:  Results for orders placed or performed in visit on 06/23/15  CBC with Differential  Result Value Ref Range   WBC 8.4 3.9 -  10.3 10e3/uL   NEUT# 7.9 (H) 1.5 - 6.5 10e3/uL   HGB 13.0 11.6 - 15.9 g/dL   HCT 39.7 34.8 - 46.6 %    Platelets 190 145 - 400 10e3/uL   MCV 87.1 79.5 - 101.0 fL   MCH 28.5 25.1 - 34.0 pg   MCHC 32.7 31.5 - 36.0 g/dL   RBC 4.56 3.70 - 5.45 10e6/uL   RDW 16.2 (H) 11.2 - 14.5 %   lymph# 0.4 (L) 0.9 - 3.3 10e3/uL   MONO# 0.0 (L) 0.1 - 0.9 10e3/uL   Eosinophils Absolute 0.0 0.0 - 0.5 10e3/uL   Basophils Absolute 0.0 0.0 - 0.1 10e3/uL   NEUT% 94.5 (H) 38.4 - 76.8 %   LYMPH% 4.9 (L) 14.0 - 49.7 %   MONO% 0.3 0.0 - 14.0 %   EOS% 0.0 0.0 - 7.0 %   BASO% 0.3 0.0 - 2.0 %  Comprehensive metabolic panel  Result Value Ref Range   Sodium 141 136 - 145 mEq/L   Potassium 4.1 3.5 - 5.1 mEq/L   Chloride 107 98 - 109 mEq/L   CO2 23 22 - 29 mEq/L   Glucose 253 (H) 70 - 140 mg/dl   BUN 8.5 7.0 - 26.0 mg/dL   Creatinine 0.8 0.6 - 1.1 mg/dL   Total Bilirubin 0.59 0.20 - 1.20 mg/dL   Alkaline Phosphatase 131 40 - 150 U/L   AST 20 5 - 34 U/L   ALT 27 0 - 55 U/L   Total Protein 7.6 6.4 - 8.3 g/dL   Albumin 3.5 3.5 - 5.0 g/dL   Calcium 9.6 8.4 - 10.4 mg/dL   Anion Gap 11 3 - 11 mEq/L   EGFR 74 (L) >90 ml/min/1.73 m2  Magnesium  Result Value Ref Range   Magnesium 1.7 1.5 - 2.5 mg/dl     Studies/Results:  No results found.  Medications: I have reviewed the patient's current medications. She will use on pro neulasta  DISCUSSION Interval history discussed. Reviewed possible aches from taxol (as she had cycle 1) and from neulasta, recommended starting claritin daily for now. Discussed need to contact this office promptly if on pro injector seems not to work correctly.   Assessment/Plan:  1. 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 Treatment is in attempt to shrink and control disease as best possible, not in curative attempt. Plan now is CDDP + taxol every 3 weeks, cycle 1 06-23-15, with neulasta. Consider adding avastin with subsequent treatments. Visit with labs 1 week.   2.post diverting sigmoid loop colostomy 05-18-15 for distal bowel obstruction from pelvic malignancy. Ostomy functioning well now 3.PAC by IR  4.chemo neutropenia with cycle 1 carbo taxol: will need with gCSF subsequent treatments. Taxol aches were not severe, so will try neulasta as on pro injector (insurance approved) 5.nutritional status: po intake much better since colostomy 6.perirectal HSV: continue prophylactic acyclovir as she will have chemo and will use steroids with taxol 7.environmental allergies controlled with claritin 8.sleep problems:  hs ativan prn 9. No advance directives:  continue to address 10 Post appendectomy   All questions answered, and she knows to call if concerns prior to scheudled visit.  Chemo orders confirmed. Time spent 25 min including >50% counseling and coordination of care.    Datrell Dunton P, MD   06/23/2015, 3:52 PM

## 2015-06-24 ENCOUNTER — Telehealth: Payer: Self-pay

## 2015-06-24 ENCOUNTER — Other Ambulatory Visit: Payer: Self-pay

## 2015-06-24 DIAGNOSIS — C763 Malignant neoplasm of pelvis: Secondary | ICD-10-CM

## 2015-06-24 NOTE — Telephone Encounter (Signed)
Ms. Katherine Boone stated that she is doing well today after the addition of the Cisplatin to her chemotherapy regime.  No n/v. Drinking fluids well.  She takes in at least 64 oz a day.  Colostomy working well. Reviewed the use of the compazine for nausea in the 72 hrs after receiving the Aloxi yesterday. Reviewed application of EMLA to PAC prior to access. Meds ready for p/u at the pharmacy. The On Pro injector is on her abdomen.  The green light is still on.  Told her tthat it would be ~7 pm when the neulasta would be injected as the on pro is programed to dispense the medication 27 hrs after it is placed on the skin and activated. Told her to call the office if she has any questions or issues with injector and keep the injector if she does have problems. Keep appointment on 06-30-15 as scheduled.

## 2015-06-24 NOTE — Telephone Encounter (Signed)
-----   Message from Gordy Levan, MD sent at 06/23/2015  1:17 PM EDT ----- Getting first CDDP (second taxol) today.  IV antiemetics now include aloxi. Pharmacy to go over aloxi and timing of oral zofran now. Please send in script for compazine 10 mg q 6 hr prn nausea #20  to use in the window after aloxi when she cannot use zofran.  RN please check on her by phone tomorrow follow up first CDDP. She will also have first on pro neulasta I told her to start claritin today  thanks

## 2015-06-24 NOTE — Patient Outreach (Addendum)
[facility]) Care Management  06/24/2015  BURKLEIGH HOR 10/18/1945 [id number]   Telephone Screen  Referral Date: 06/13/15 Referral Source: [employer]) Referral Reason: "disease and symptom management"    Outreach attempt #3 to patient. Patient reached. Screening completed.  Social: Patient resides in her home alone. She is ADLs/IADLs independent. Denies any recent falls. Patient able to drive herself or has a friend take her to appts.  Conditions: Patient states that she was healthy and not taking any meds up until recent diagnosis of stage 4 cancer to pelvis, stomach, lungs and liver. Patient states that MDs have told her "it doesn't look good." patient has a strong will and determination to fight this cancer and wants to live. She voices she is currently undergoing chemo txs about q3wks. She states side effects from treatments have improved. She is having issues with elevated blood sugars(she is not a diabetic) possibly related to steroid usage.  Medications: Patient voices some financial hardships with affording meds. She reports that she has to run up her "charge cards" in order to afford meds or rely on family/friends. She is taking less than 10 meds.  Consent: Patient gave verbal consent for [facility] services.  Plan: RN CM will notify [facility] administrative assistant of case status. RN CM provided patient with [facility] contact info. RN CM will contact patient within a month. RN CM will send [facility] pharmacy referral for possible med assistance. RN CM will send [facility] successful outreach letter and welcome package to patient.  Enzo Montgomery, RN,BSN,CCM [place] [facility] Telephonic Care Management Coordinator Direct Phone: [phone] Toll Free: [phone] Fax: [phone]

## 2015-06-25 ENCOUNTER — Other Ambulatory Visit: Payer: Self-pay | Admitting: Oncology

## 2015-06-25 DIAGNOSIS — Z95828 Presence of other vascular implants and grafts: Secondary | ICD-10-CM | POA: Insufficient documentation

## 2015-06-29 ENCOUNTER — Other Ambulatory Visit: Payer: Self-pay | Admitting: Oncology

## 2015-06-29 DIAGNOSIS — C539 Malignant neoplasm of cervix uteri, unspecified: Secondary | ICD-10-CM

## 2015-06-29 DIAGNOSIS — C799 Secondary malignant neoplasm of unspecified site: Secondary | ICD-10-CM

## 2015-06-29 DIAGNOSIS — C7989 Secondary malignant neoplasm of other specified sites: Secondary | ICD-10-CM

## 2015-06-30 ENCOUNTER — Ambulatory Visit: Payer: Commercial Managed Care - HMO

## 2015-06-30 ENCOUNTER — Telehealth: Payer: Self-pay | Admitting: Oncology

## 2015-06-30 ENCOUNTER — Other Ambulatory Visit: Payer: Commercial Managed Care - HMO

## 2015-06-30 ENCOUNTER — Ambulatory Visit (HOSPITAL_BASED_OUTPATIENT_CLINIC_OR_DEPARTMENT_OTHER): Payer: Commercial Managed Care - HMO | Admitting: Oncology

## 2015-06-30 ENCOUNTER — Other Ambulatory Visit (HOSPITAL_BASED_OUTPATIENT_CLINIC_OR_DEPARTMENT_OTHER): Payer: Commercial Managed Care - HMO

## 2015-06-30 ENCOUNTER — Ambulatory Visit: Payer: Commercial Managed Care - HMO | Admitting: Oncology

## 2015-06-30 ENCOUNTER — Encounter: Payer: Self-pay | Admitting: Oncology

## 2015-06-30 VITALS — BP 104/58 | HR 124 | Temp 98.0°F | Resp 19 | Ht 67.0 in | Wt 158.3 lb

## 2015-06-30 DIAGNOSIS — C7801 Secondary malignant neoplasm of right lung: Secondary | ICD-10-CM | POA: Diagnosis not present

## 2015-06-30 DIAGNOSIS — C7989 Secondary malignant neoplasm of other specified sites: Secondary | ICD-10-CM

## 2015-06-30 DIAGNOSIS — C799 Secondary malignant neoplasm of unspecified site: Secondary | ICD-10-CM

## 2015-06-30 DIAGNOSIS — Z933 Colostomy status: Secondary | ICD-10-CM

## 2015-06-30 DIAGNOSIS — C801 Malignant (primary) neoplasm, unspecified: Secondary | ICD-10-CM

## 2015-06-30 DIAGNOSIS — C787 Secondary malignant neoplasm of liver and intrahepatic bile duct: Secondary | ICD-10-CM

## 2015-06-30 DIAGNOSIS — C539 Malignant neoplasm of cervix uteri, unspecified: Secondary | ICD-10-CM

## 2015-06-30 DIAGNOSIS — D701 Agranulocytosis secondary to cancer chemotherapy: Secondary | ICD-10-CM

## 2015-06-30 DIAGNOSIS — C7802 Secondary malignant neoplasm of left lung: Secondary | ICD-10-CM

## 2015-06-30 DIAGNOSIS — B009 Herpesviral infection, unspecified: Secondary | ICD-10-CM

## 2015-06-30 DIAGNOSIS — T451X5A Adverse effect of antineoplastic and immunosuppressive drugs, initial encounter: Secondary | ICD-10-CM

## 2015-06-30 DIAGNOSIS — Z95828 Presence of other vascular implants and grafts: Secondary | ICD-10-CM

## 2015-06-30 DIAGNOSIS — E860 Dehydration: Secondary | ICD-10-CM

## 2015-06-30 LAB — COMPREHENSIVE METABOLIC PANEL
ALT: 33 U/L (ref 0–55)
ANION GAP: 8 meq/L (ref 3–11)
AST: 19 U/L (ref 5–34)
Albumin: 3.7 g/dL (ref 3.5–5.0)
Alkaline Phosphatase: 145 U/L (ref 40–150)
BILIRUBIN TOTAL: 0.55 mg/dL (ref 0.20–1.20)
BUN: 17.6 mg/dL (ref 7.0–26.0)
CALCIUM: 9.7 mg/dL (ref 8.4–10.4)
CO2: 29 meq/L (ref 22–29)
Chloride: 101 mEq/L (ref 98–109)
Creatinine: 1 mg/dL (ref 0.6–1.1)
EGFR: 59 mL/min/{1.73_m2} — AB (ref >90)
Glucose: 132 mg/dl (ref 70–140)
Potassium: 4.5 mEq/L (ref 3.5–5.1)
SODIUM: 137 meq/L (ref 136–145)
TOTAL PROTEIN: 7.3 g/dL (ref 6.4–8.3)

## 2015-06-30 LAB — CBC WITH DIFFERENTIAL/PLATELET
BASO%: 0.4 % (ref 0.0–2.0)
BASOS ABS: 0 10*3/uL (ref 0.0–0.1)
EOS ABS: 0.2 10*3/uL (ref 0.0–0.5)
EOS%: 4.1 % (ref 0.0–7.0)
HCT: 37 % (ref 34.8–46.6)
HGB: 12.5 g/dL (ref 11.6–15.9)
LYMPH%: 36.9 % (ref 14.0–49.7)
MCH: 28.9 pg (ref 25.1–34.0)
MCHC: 33.8 g/dL (ref 31.5–36.0)
MCV: 85.5 fL (ref 79.5–101.0)
MONO#: 1.2 10*3/uL — ABNORMAL HIGH (ref 0.1–0.9)
MONO%: 24.3 % — AB (ref 0.0–14.0)
NEUT#: 1.7 10*3/uL (ref 1.5–6.5)
NEUT%: 34.3 % — ABNORMAL LOW (ref 38.4–76.8)
Platelets: 121 10*3/uL — ABNORMAL LOW (ref 145–400)
RBC: 4.33 10*6/uL (ref 3.70–5.45)
RDW: 14.5 % (ref 11.2–14.5)
WBC: 4.8 10*3/uL (ref 3.9–10.3)
lymph#: 1.8 10*3/uL (ref 0.9–3.3)

## 2015-06-30 LAB — MAGNESIUM: MAGNESIUM: 1.6 mg/dL (ref 1.5–2.5)

## 2015-06-30 MED ORDER — SODIUM CHLORIDE 0.9% FLUSH
10.0000 mL | INTRAVENOUS | Status: AC | PRN
  Filled 2015-06-30: qty 10

## 2015-06-30 MED ORDER — TBO-FILGRASTIM 480 MCG/0.8ML ~~LOC~~ SOSY: 480.0000 ug | PREFILLED_SYRINGE | Freq: Once | SUBCUTANEOUS | Status: DC

## 2015-06-30 MED ORDER — HEPARIN SOD (PORK) LOCK FLUSH 100 UNIT/ML IV SOLN
500.0000 [IU] | Freq: Once | INTRAVENOUS | Status: AC
  Filled 2015-06-30: qty 5

## 2015-06-30 NOTE — Progress Notes (Signed)
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 She had cycle 1 carbo taxol prior to diverting sigmoid loop colostomy 05-19-15 , and had first CDDP taxol on 06-23-15. She was neutropenic with first chemo and had on pro neulasta with 06-23-15 treatment. Plan is to add avastin with second CDDP taxol.   Patient felt weak and somewhat lightheaded on days 4-5-6, probably not drinking fluids optimally. Nausea has not been severe, with aloxi and emend day of chemo. Bowels are moving via colostomy. No bleeding. Denies any pain. No significant peripheral neuropathy. She is able to sleep with Restoril 15 mg at hs. No abdominal or pelvic pain. She has lost hair. PAC functioned without difficulty. She denies SOB or cough. No other pain. No LE swelling.  She is not markedly fatigued. She does not complain of marked taxol aches. Remainder of 10 point Review of Systems negative / unchanged.   Flu vaccine 05-06-15 PAC by IR 06-10-15  ONCOLOGIC HISTORY  Patient was in her usual very good health until new cramping abdominal pain intermittently for last few months. She had some constipation with this, particularly severe when she saw PCP for this reason on 04-26-15. She has had no vaginal bleeding. She had no bowel obstruction by xray and bowels moved with miralax. CT AP 04-26-15 had 10.3 x 13.8 mixed cystic and solid mass involving uterus and extending posteriorly into cul de sac and into bilateral adnexal regions, sigmoid colon tethered to mass, no ascites, scattered necrotic lymph nodes in mesentery and omentum, no hydroureter, 3 lesions in liver up to 3.4 cm, and numerous bilateral pulmonary nodules in lung bases. CEA was 1.1 and CA 125 was 105; CBC and CMET 04-26-15  were entirely normal. 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 She had CT biopsy of pelvic mass in region of left psoas on 3-1-1 7. Pathology [phone], showed high grade poorly differentiated carcinoma with immunohistochemical stains suggesting cervical primary, which did not fit clinical picture well. CT chest 05-10-15 showed multiple bilateral pulmonary nodules (>40), 3 liver lesions up to 3.7 cm, left supraclavicular node and left hilar nodes. Chemotherapy was begun with first carboplatin and taxol on 05-12-15. Admitted with colonic obstruction 05-15-15, diverting sigmoid loop colostomy 05-18-15. Neutropenic 05-19-15 (day 8 cycle 1) despite granix begun day 5. Chemo changed to CDDP taxol with review of the pathology information, cycle 1 delayed due to inadequate IV access on 06-09-15. She had first CDDP taxol on 06-23-15.    Objective:  Vital signs in last 24 hours:  BP 104/58 mmHg  Pulse 124  Temp(Src) 98 F (36.7 C) (Oral)  Resp 19  Ht 5' 7"  (1.702 m)  Wt 158 lb 4.8 oz (71.804 kg)  BMI 24.79 kg/m2  SpO2 99%  Orthostatic vitals now:  Supine 104/64 HR 100,   Sitting 104/64  HR 120,   Standing 104/64   HR 124  weight down 5.5 lbs from 05-26-15 Alert, oriented and appropriate. Ambulatory without assistance in office.  Alopecia  HEENT:PERRL, sclerae not icteric. Oral mucosa moist without lesions, posterior pharynx clear.  Neck supple. No JVD.  Lymphatics:left supraclavicular adenopathy Resp: somewhat diminished BS but no wheezes or rales bilaterally and normal percussion bilaterally Cardio: regular rate and rhythm. No gallop. GI: soft,  nontender, not distended, ostomy with soft stool. Normally active bowel sounds.  Musculoskeletal/ Extremities: LE without pitting edema, cords, tenderness Neuro: no peripheral neuropathy. Otherwise nonfocal. PSYCH appropriate mood and affect Skin without rash, ecchymosis, petechiae Portacath-without  erythema or tenderness  Lab Results:  Results for orders placed or performed in visit on 06/30/15  CBC with Differential  Result Value Ref Range   WBC 4.8 3.9 - 10.3 10e3/uL   NEUT# 1.7 1.5 - 6.5 10e3/uL   HGB 12.5 11.6 - 15.9 g/dL   HCT 37.0 34.8 - 46.6 %   Platelets 121 (L) 145 - 400 10e3/uL   MCV 85.5 79.5 - 101.0 fL   MCH 28.9 25.1 - 34.0 pg   MCHC 33.8 31.5 - 36.0 g/dL   RBC 4.33 3.70 - 5.45 10e6/uL   RDW 14.5 11.2 - 14.5 %   lymph# 1.8 0.9 - 3.3 10e3/uL   MONO# 1.2 (H) 0.1 - 0.9 10e3/uL   Eosinophils Absolute 0.2 0.0 - 0.5 10e3/uL   Basophils Absolute 0.0 0.0 - 0.1 10e3/uL   NEUT% 34.3 (L) 38.4 - 76.8 %   LYMPH% 36.9 14.0 - 49.7 %   MONO% 24.3 (H) 0.0 - 14.0 %   EOS% 4.1 0.0 - 7.0 %   BASO% 0.4 0.0 - 2.0 %  Comprehensive metabolic panel  Result Value Ref Range   Sodium 137 136 - 145 mEq/L   Potassium 4.5 3.5 - 5.1 mEq/L   Chloride 101 98 - 109 mEq/L   CO2 29 22 - 29 mEq/L   Glucose 132 70 - 140 mg/dl   BUN 17.6 7.0 - 26.0 mg/dL   Creatinine 1.0 0.6 - 1.1 mg/dL   Total Bilirubin 0.55 0.20 - 1.20 mg/dL   Alkaline Phosphatase 145 40 - 150 U/L   AST 19 5 - 34 U/L   ALT 33 0 - 55 U/L   Total Protein 7.3 6.4 - 8.3 g/dL   Albumin 3.7 3.5 - 5.0 g/dL   Calcium 9.7 8.4 - 10.4 mg/dL   Anion Gap 8 3 - 11 mEq/L   EGFR 59 (L) >90 ml/min/1.73 m2  Magnesium  Result Value Ref Range   Magnesium 1.6 1.5 - 2.5 mg/dl     Studies/Results:  No results found.  Medications: I have reviewed the patient's current medications.  DISCUSSION History from from first CDDP taxol a week ago reviewed. Patient prefers to push po fluids rather than IVF today.  Counts may still drop with on pro neulasta used, but hopefully will recover quickly. May need recheck counts/ orthostatic vitals on 5-1 if fatigued or inadequate po fluids.   Has not had avastin teaching.   Assessment/Plan:  1. high grade poorly differentiated gyn carcinoma with pathology immunostains consistent with cervical or  endocervical primary, metastatic in pelvis and to lungs and liver. Treatment is in attempt to shrink and control disease as best possible, not in curative attempt. Plan now is CDDP + taxol every 3 weeks, cycle 1 06-23-15, with neulasta. Expect will add avastin beginning 07-14-15.   2.post diverting sigmoid loop colostomy 05-18-15 for distal bowel obstruction from pelvic malignancy. Ostomy functioning well now 3.PAC by IR  4.chemo neutropenia with cycle 1 carbo taxol: Had on pro neulasta with CDDP Taxol 06-23-15. .  5.nutritional status: po intake much better since colostomy 6.perirectal HSV: continue prophylactic acyclovir as she will have chemo and will use steroids with taxol 7.environmental allergies controlled with claritin 8.sleep problems:Restoril 15 mg effective now 9. No advance directives: continue to  address 10. Po fluid intake not optimal now, BP stable but HR with orthostatic changes consistent with some dehydration, renal function minimally higher tho still in normal range. Patient to push po fluids. She will let us know if unable to drink fluids adequately or if more symptoms prior to next visit.   All questions answered. TIme spent 25 min including >50% counseling and coordination of care. Route PCP. Cc Dr Josephina Shih for update.  Gordy Levan, MD   06/30/2015, 8:33 PM

## 2015-06-30 NOTE — Telephone Encounter (Signed)
Added appts °

## 2015-07-01 ENCOUNTER — Telehealth: Payer: Self-pay | Admitting: Oncology

## 2015-07-01 NOTE — Telephone Encounter (Signed)
s.w. pt and advised on 5.1 appt

## 2015-07-02 ENCOUNTER — Other Ambulatory Visit: Payer: Self-pay | Admitting: Oncology

## 2015-07-02 DIAGNOSIS — C539 Malignant neoplasm of cervix uteri, unspecified: Secondary | ICD-10-CM

## 2015-07-03 ENCOUNTER — Other Ambulatory Visit: Payer: Self-pay | Admitting: Oncology

## 2015-07-04 ENCOUNTER — Telehealth: Payer: Self-pay

## 2015-07-04 ENCOUNTER — Ambulatory Visit: Payer: Commercial Managed Care - HMO

## 2015-07-04 ENCOUNTER — Other Ambulatory Visit: Payer: Commercial Managed Care - HMO

## 2015-07-04 ENCOUNTER — Telehealth: Payer: Self-pay | Admitting: Oncology

## 2015-07-04 NOTE — Telephone Encounter (Signed)
s.w. pt and advised on 5.4 appt pt ok and aware

## 2015-07-04 NOTE — Telephone Encounter (Signed)
-----   Message from Gordy Levan, MD sent at 07/02/2015  9:13 AM EDT ----- I think this will be best plan:  RN please check on her by phone on 5-1. If fatigued, light headed, any question about adequate po fluids, she should have CBC BMET and orthostatic vitals checked at [facility] on 5-1 or at least 5-2.  I already sent POF.  I will see her with labs also on 5-4.  I have left visit with K.Curcio on 5-8 in case needed, will decide when I see her on 5-4 and can cancel then if not needed.  She needs avastin teaching by phone or at office, by desk RN or chemo education RN. We had mentioned possible avastin in addtion to CDDP Taxol, as that total combination can sometimes be most effective in controlling advanced, metastatic cervical cancers. I did not talk about avastin again when I saw her on 4-27;  I will talk with her about it when I see her again on 5-4.  Alleta/ Abigail Butts - may need your help with avastin teaching if has to be on Mon 5-1 or Thurs 5-4, as my clinics are packed those days   Thank you Lennis

## 2015-07-04 NOTE — Telephone Encounter (Signed)
Katherine Boone is drinking close to 64 oz since 07-01-15.  She felt much better Saturday 07-02-15.  She is not fatigued.  Occasionally light headed.   She is going to see Dr. Marko Plume on Thursday 07-07-15 with labs.  She really does not feel she needs to come in today for labs especially with appointment on Thursday. Discussed with Dr. Marko Plume. Told [name] that Dr. Marko Plume said that it is fine that she not come in earlier then 07-07-15 since she is feeling better.

## 2015-07-06 ENCOUNTER — Other Ambulatory Visit: Payer: Self-pay | Admitting: Oncology

## 2015-07-06 ENCOUNTER — Other Ambulatory Visit: Payer: Self-pay

## 2015-07-06 DIAGNOSIS — C539 Malignant neoplasm of cervix uteri, unspecified: Secondary | ICD-10-CM

## 2015-07-06 DIAGNOSIS — C7989 Secondary malignant neoplasm of other specified sites: Secondary | ICD-10-CM

## 2015-07-07 ENCOUNTER — Ambulatory Visit (HOSPITAL_BASED_OUTPATIENT_CLINIC_OR_DEPARTMENT_OTHER): Payer: Commercial Managed Care - HMO | Admitting: Oncology

## 2015-07-07 ENCOUNTER — Telehealth: Payer: Self-pay | Admitting: Oncology

## 2015-07-07 ENCOUNTER — Other Ambulatory Visit (HOSPITAL_BASED_OUTPATIENT_CLINIC_OR_DEPARTMENT_OTHER): Payer: Commercial Managed Care - HMO

## 2015-07-07 ENCOUNTER — Encounter: Payer: Self-pay | Admitting: Oncology

## 2015-07-07 VITALS — BP 115/65 | HR 78 | Temp 97.9°F | Resp 18 | Ht 67.0 in | Wt 163.1 lb

## 2015-07-07 DIAGNOSIS — Z95828 Presence of other vascular implants and grafts: Secondary | ICD-10-CM

## 2015-07-07 DIAGNOSIS — C801 Malignant (primary) neoplasm, unspecified: Secondary | ICD-10-CM | POA: Diagnosis not present

## 2015-07-07 DIAGNOSIS — C7989 Secondary malignant neoplasm of other specified sites: Secondary | ICD-10-CM

## 2015-07-07 DIAGNOSIS — C539 Malignant neoplasm of cervix uteri, unspecified: Secondary | ICD-10-CM

## 2015-07-07 DIAGNOSIS — C7802 Secondary malignant neoplasm of left lung: Secondary | ICD-10-CM

## 2015-07-07 DIAGNOSIS — B009 Herpesviral infection, unspecified: Secondary | ICD-10-CM

## 2015-07-07 DIAGNOSIS — C787 Secondary malignant neoplasm of liver and intrahepatic bile duct: Secondary | ICD-10-CM

## 2015-07-07 DIAGNOSIS — C7801 Secondary malignant neoplasm of right lung: Secondary | ICD-10-CM

## 2015-07-07 LAB — CBC WITH DIFFERENTIAL/PLATELET
BASO%: 0.7 % (ref 0.0–2.0)
Basophils Absolute: 0.1 10*3/uL (ref 0.0–0.1)
EOS%: 1 % (ref 0.0–7.0)
Eosinophils Absolute: 0.1 10*3/uL (ref 0.0–0.5)
HEMATOCRIT: 35.3 % (ref 34.8–46.6)
HGB: 11.6 g/dL (ref 11.6–15.9)
LYMPH#: 1.5 10*3/uL (ref 0.9–3.3)
LYMPH%: 18.4 % (ref 14.0–49.7)
MCH: 28.6 pg (ref 25.1–34.0)
MCHC: 33 g/dL (ref 31.5–36.0)
MCV: 86.8 fL (ref 79.5–101.0)
MONO#: 0.5 10*3/uL (ref 0.1–0.9)
MONO%: 6 % (ref 0.0–14.0)
NEUT#: 6.1 10*3/uL (ref 1.5–6.5)
NEUT%: 73.9 % (ref 38.4–76.8)
Platelets: 161 10*3/uL (ref 145–400)
RBC: 4.07 10*6/uL (ref 3.70–5.45)
RDW: 16.3 % — ABNORMAL HIGH (ref 11.2–14.5)
WBC: 8.3 10*3/uL (ref 3.9–10.3)

## 2015-07-07 NOTE — Telephone Encounter (Signed)
per pof to sch pt appt-gave pt copy of avs °

## 2015-07-07 NOTE — Progress Notes (Signed)
OFFICE PROGRESS NOTE   Jul 09, 2015   Physicians: D.ClarkePearson, [name] (PCP), Leighton Ruff  INTERVAL HISTORY:   Patient is seen, alone for visit, in continuing attention to treatment begun for metastatic cervical vs endocervical carcinoma, poorly differentiated high grade malignancy involving pelvis, lung and liver.  She had cycle 1 carbo taxol prior to diverting sigmoid loop colostomy 05-19-15 , then regiment changed to CDDP taxol on 06-23-15. She was neutropenic with first carbo taxol; first CDDP taxol on 06-23-15 used on pro neulasta, no documented neutropenia and only mild thrombocytopenia (120k). She did not drink fluid optimally for a few days after first CDDP taxol. Plan is to add avastin with second CDDP taxol.   Patient has felt very well for past week, with no nausea, good appetite and more po fluids, good energy and no respiratory symptoms. She has needed no anitemetics in several days. She denies any pain or bleeding. Colostomy has been functioning well. She denies any peripheral neuropathy. PAC is not uncomfortable. She is voiding easily. No LE swelling. No fever or symptoms of infection. Not light headed now. No problems with PAC. Remainder of 10 point Review of Systems negative/ unchanged.   Flu vaccine 05-06-15 PAC by IR 06-10-15  ONCOLOGIC HISTORY Patient was in her usual very good health until new cramping abdominal pain intermittently for last few months. She had some constipation with this, particularly severe when she saw PCP for this reason on 04-26-15. She has had no vaginal bleeding. She had no bowel obstruction by xray and bowels moved with miralax. CT AP 04-26-15 had 10.3 x 13.8 mixed cystic and solid mass involving uterus and extending posteriorly into cul de sac and into bilateral adnexal regions, sigmoid colon tethered to mass, no ascites, scattered necrotic lymph nodes in mesentery and omentum, no hydroureter, 3 lesions in liver up to 3.4 cm, and numerous  bilateral pulmonary nodules in lung bases. CEA was 1.1 and CA 125 was 105; CBC and CMET 04-26-15 were entirely normal. 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 She had CT biopsy of pelvic mass in region of left psoas on 3-1-1 7. Pathology [phone], showed high grade poorly differentiated carcinoma with immunohistochemical stains suggesting cervical primary, which did not fit clinical picture well. CT chest 05-10-15 showed multiple bilateral pulmonary nodules (>40), 3 liver lesions up to 3.7 cm, left supraclavicular node and left hilar nodes. Chemotherapy was begun with first carboplatin and taxol on 05-12-15. Admitted with colonic obstruction 05-15-15, diverting sigmoid loop colostomy 05-18-15. Neutropenic 05-19-15 (day 8 cycle 1) despite granix begun day 5. Chemo changed to CDDP taxol with review of the pathology information, cycle 1 delayed due to inadequate IV access on 06-09-15. She had first CDDP taxol on 06-23-15 with on pro neulasta, no documented neutropenia and only mild thrombocytopenia (120k).   Objective:  Vital signs in last 24 hours:  BP 115/65 mmHg  Pulse 78  Temp(Src) 97.9 F (36.6 C) (Oral)  Resp 18  Ht 5\' 7"  (1.702 m)  Wt 163 lb 1.6 oz (73.982 kg)  BMI 25.54 kg/m2  SpO2 100% Weight up 5 lbs. Alert, oriented and appropriate. Ambulatory without difficulty. Respirations not labored RA Alopecia  HEENT:PERRL, sclerae not icteric. Oral mucosa moist without lesions, posterior pharynx clear.  Neck supple. No JVD.  Lymphatics:no cervical,supraclavicular adenopathy Resp: clear to auscultation bilaterally and normal percussion bilaterally Cardio: regular rate and rhythm. No gallop. GI: soft, nontender, not distended, no organomegaly. Normally active  bowel sounds. Soft stool in ostomy bag.  Musculoskeletal/ Extremities: without pitting edema, cords, tenderness Neuro: no peripheral neuropathy. Otherwise nonfocal. PSYCH appropriate  mood or affect Skin without rash, ecchymosis, petechiae Portacath-without erythema or tenderness  Lab Results:  Results for orders placed or performed in visit on 07/07/15  CBC with Differential  Result Value Ref Range   WBC 8.3 3.9 - 10.3 10e3/uL   NEUT# 6.1 1.5 - 6.5 10e3/uL   HGB 11.6 11.6 - 15.9 g/dL   HCT 35.3 34.8 - 46.6 %   Platelets 161 145 - 400 10e3/uL   MCV 86.8 79.5 - 101.0 fL   MCH 28.6 25.1 - 34.0 pg   MCHC 33.0 31.5 - 36.0 g/dL   RBC 4.07 3.70 - 5.45 10e6/uL   RDW 16.3 (H) 11.2 - 14.5 %   lymph# 1.5 0.9 - 3.3 10e3/uL   MONO# 0.5 0.1 - 0.9 10e3/uL   Eosinophils Absolute 0.1 0.0 - 0.5 10e3/uL   Basophils Absolute 0.1 0.0 - 0.1 10e3/uL   NEUT% 73.9 38.4 - 76.8 %   LYMPH% 18.4 14.0 - 49.7 %   MONO% 6.0 0.0 - 14.0 %   EOS% 1.0 0.0 - 7.0 %   BASO% 0.7 0.0 - 2.0 %     Studies/Results:  No results found.  Medications: I have reviewed the patient's current medications. WIll use aloxi and emend as cycle 1, with on pro neulasta Teaching done by RN for avastin.   DISCUSSION  Patient is pleased with how well she has felt last several days. Reviewed rationale for adding avastin to CDDP and taxol, reviewed possible side effects . Verbal consent given for treatment. Reminded her to push po fluids after chemo.  She wants to go back to beach May 18-24, will scheduled appointments around that as possible.      Assessment/Plan:  1. 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 Treatment is in attempt to shrink and control disease as best possible, not in curative attempt. Plan CDDP + taxol every 3 weeks, cycle 2 on 07-14-15 with first avastin at 15 mg/kg and on pro neulasta. She will be treated on 07-14-15 as long as ANC >=1.5, plt >=100k, renal function and bili adequate, urine protein within parameters. Aloxi and emend with other antiemetics. If  unable to take po fluids adequately after treatment, will need IVF. 2.post diverting sigmoid loop colostomy 05-18-15 for distal bowel obstruction from pelvic malignancy. Ostomy functioning well now 3.PAC by IR  4.chemo neutropenia with cycle 1 carbo taxol: Had on pro neulasta with CDDP Taxol 06-23-15, no documented neutropenia and minimal decrease in platelets. 5.nutritional status: po intake much better since colostomy 6.perirectal HSV: continue prophylactic acyclovir as she will have chemo and will use steroids with taxol 7.environmental allergies controlled with claritin 8.sleep problems:Restoril 15 mg effective now 9. No advance directives: continue to address 10. Po fluid intake not optimal for several days after first CDDP taxol, some symptoms from dehydration but able then to push po fluids and she will be aware of this ongoing.    All questions answered. Chemo, avastin and on pro neulasta orders placed. Message to RN to check on her re possible IVF ~ 5-12 Time spent 25 min including >50% counseling and coordination of care. Route PCP   Gordy Levan, MD   07/09/2015, 12:44 PM

## 2015-07-11 ENCOUNTER — Telehealth: Payer: Self-pay

## 2015-07-11 ENCOUNTER — Ambulatory Visit: Payer: Self-pay

## 2015-07-11 ENCOUNTER — Ambulatory Visit: Payer: Commercial Managed Care - HMO | Admitting: Oncology

## 2015-07-11 ENCOUNTER — Other Ambulatory Visit: Payer: Self-pay

## 2015-07-11 ENCOUNTER — Other Ambulatory Visit: Payer: Commercial Managed Care - HMO

## 2015-07-11 DIAGNOSIS — Z598 Other problems related to housing and economic circumstances: Secondary | ICD-10-CM

## 2015-07-11 DIAGNOSIS — Z599 Problem related to housing and economic circumstances, unspecified: Secondary | ICD-10-CM

## 2015-07-11 NOTE — Telephone Encounter (Signed)
Pt called asking about her decadron the night prior to chemo. Went over the timing with her. Encouraged her to drink, she states she is b/c she does not want to come in with dehydration. Explained we will be calling her a few days after chemo to see how she is doing.

## 2015-07-11 NOTE — Patient Outreach (Signed)
I called Ms. Holder to discuss her need for assistance with medications.  She stated that she did not have a need with her medications but rather it was her co-pays to her providers.  She stated it costs $45 every time she has to go to the doctor.  I stated I could refer to the social worker to determine if there are any community resources that could assist her. She stated she would appreciate it.  I will close her to pharmacy since she has no pharmacy needs at this time.   Deanne Coffer, PharmD, [place] [phone]

## 2015-07-11 NOTE — Addendum Note (Signed)
Addended by: Deanne Coffer E on: 07/11/2015 04:14 PM   Modules accepted: Orders

## 2015-07-12 ENCOUNTER — Telehealth: Payer: Self-pay

## 2015-07-12 NOTE — Telephone Encounter (Signed)
-----   Message from Gordy Levan, MD sent at 07/09/2015 12:57 PM EDT ----- Please check with her by phone after second CDDP/ taxol and first avastin planned 07-14-15. She needs to drink fluids well, as was dehydrated after first CDDP taxol. Can give IVF ~ 5-13 or 5-15 if needed.  She plans to go to beach 5[place]  thanks

## 2015-07-14 ENCOUNTER — Other Ambulatory Visit (HOSPITAL_BASED_OUTPATIENT_CLINIC_OR_DEPARTMENT_OTHER): Payer: Commercial Managed Care - HMO

## 2015-07-14 ENCOUNTER — Ambulatory Visit (HOSPITAL_BASED_OUTPATIENT_CLINIC_OR_DEPARTMENT_OTHER): Payer: Commercial Managed Care - HMO

## 2015-07-14 ENCOUNTER — Other Ambulatory Visit: Payer: Self-pay

## 2015-07-14 ENCOUNTER — Encounter: Payer: Self-pay | Admitting: *Deleted

## 2015-07-14 ENCOUNTER — Ambulatory Visit: Payer: Commercial Managed Care - HMO

## 2015-07-14 VITALS — BP 113/65 | HR 81 | Temp 98.2°F | Resp 18

## 2015-07-14 DIAGNOSIS — C7989 Secondary malignant neoplasm of other specified sites: Secondary | ICD-10-CM

## 2015-07-14 DIAGNOSIS — C799 Secondary malignant neoplasm of unspecified site: Secondary | ICD-10-CM

## 2015-07-14 DIAGNOSIS — C539 Malignant neoplasm of cervix uteri, unspecified: Secondary | ICD-10-CM

## 2015-07-14 DIAGNOSIS — C7801 Secondary malignant neoplasm of right lung: Secondary | ICD-10-CM

## 2015-07-14 DIAGNOSIS — C787 Secondary malignant neoplasm of liver and intrahepatic bile duct: Secondary | ICD-10-CM | POA: Diagnosis not present

## 2015-07-14 DIAGNOSIS — C801 Malignant (primary) neoplasm, unspecified: Secondary | ICD-10-CM | POA: Diagnosis not present

## 2015-07-14 DIAGNOSIS — Z5111 Encounter for antineoplastic chemotherapy: Secondary | ICD-10-CM | POA: Diagnosis not present

## 2015-07-14 DIAGNOSIS — C7802 Secondary malignant neoplasm of left lung: Secondary | ICD-10-CM

## 2015-07-14 DIAGNOSIS — Z5112 Encounter for antineoplastic immunotherapy: Secondary | ICD-10-CM | POA: Diagnosis not present

## 2015-07-14 LAB — CBC WITH DIFFERENTIAL/PLATELET
BASO%: 0 % (ref 0.0–2.0)
BASOS ABS: 0 10*3/uL (ref 0.0–0.1)
EOS%: 0 % (ref 0.0–7.0)
Eosinophils Absolute: 0 10*3/uL (ref 0.0–0.5)
HEMATOCRIT: 34.7 % — AB (ref 34.8–46.6)
HEMOGLOBIN: 11.9 g/dL (ref 11.6–15.9)
LYMPH#: 0.6 10*3/uL — AB (ref 0.9–3.3)
LYMPH%: 9.7 % — ABNORMAL LOW (ref 14.0–49.7)
MCH: 29.5 pg (ref 25.1–34.0)
MCHC: 34.3 g/dL (ref 31.5–36.0)
MCV: 85.9 fL (ref 79.5–101.0)
MONO#: 0 10*3/uL — AB (ref 0.1–0.9)
MONO%: 0.2 % (ref 0.0–14.0)
NEUT#: 5.5 10*3/uL (ref 1.5–6.5)
NEUT%: 90.1 % — AB (ref 38.4–76.8)
Platelets: 252 10*3/uL (ref 145–400)
RBC: 4.04 10*6/uL (ref 3.70–5.45)
RDW: 15.7 % — AB (ref 11.2–14.5)
WBC: 6.1 10*3/uL (ref 3.9–10.3)

## 2015-07-14 LAB — COMPREHENSIVE METABOLIC PANEL
ALBUMIN: 3.9 g/dL (ref 3.5–5.0)
ALK PHOS: 122 U/L (ref 40–150)
ALT: 20 U/L (ref 0–55)
ANION GAP: 8 meq/L (ref 3–11)
AST: 19 U/L (ref 5–34)
BILIRUBIN TOTAL: 0.48 mg/dL (ref 0.20–1.20)
BUN: 12.6 mg/dL (ref 7.0–26.0)
CALCIUM: 9.9 mg/dL (ref 8.4–10.4)
CHLORIDE: 108 meq/L (ref 98–109)
CO2: 25 mEq/L (ref 22–29)
CREATININE: 0.8 mg/dL (ref 0.6–1.1)
EGFR: 75 mL/min/{1.73_m2} — ABNORMAL LOW (ref >90)
Glucose: 173 mg/dl — ABNORMAL HIGH (ref 70–140)
Potassium: 4.9 mEq/L (ref 3.5–5.1)
Sodium: 140 mEq/L (ref 136–145)
TOTAL PROTEIN: 7.8 g/dL (ref 6.4–8.3)

## 2015-07-14 LAB — UA PROTEIN, DIPSTICK - CHCC: PROTEIN: NEGATIVE mg/dL

## 2015-07-14 LAB — MAGNESIUM: MAGNESIUM: 1.8 mg/dL (ref 1.5–2.5)

## 2015-07-14 MED ORDER — FAMOTIDINE IN NACL 20-0.9 MG/50ML-% IV SOLN
INTRAVENOUS | Status: AC
  Filled 2015-07-14: qty 50

## 2015-07-14 MED ORDER — POTASSIUM CHLORIDE 2 MEQ/ML IV SOLN
Freq: Once | INTRAVENOUS | Status: AC
  Administered 2015-07-14: 10:00:00 via INTRAVENOUS
  Filled 2015-07-14: qty 10

## 2015-07-14 MED ORDER — FUROSEMIDE 10 MG/ML IJ SOLN: 20.0000 mg | INTRAMUSCULAR | Status: DC | PRN

## 2015-07-14 MED ORDER — SODIUM CHLORIDE 0.9 % IV SOLN
135.0000 mg/m2 | Freq: Once | INTRAVENOUS | Status: AC
  Administered 2015-07-14: 252 mg via INTRAVENOUS
  Filled 2015-07-14: qty 42

## 2015-07-14 MED ORDER — DIPHENHYDRAMINE HCL 50 MG/ML IJ SOLN
50.0000 mg | Freq: Once | INTRAMUSCULAR | Status: AC
  Administered 2015-07-14: 50 mg via INTRAVENOUS

## 2015-07-14 MED ORDER — SODIUM CHLORIDE 0.9 % IV SOLN
Freq: Once | INTRAVENOUS | Status: AC
  Administered 2015-07-14: 09:00:00 via INTRAVENOUS

## 2015-07-14 MED ORDER — SODIUM CHLORIDE 0.9% FLUSH
10.0000 mL | INTRAVENOUS | Status: DC | PRN
  Administered 2015-07-14: 10 mL
  Filled 2015-07-14: qty 10

## 2015-07-14 MED ORDER — DIPHENHYDRAMINE HCL 50 MG/ML IJ SOLN
INTRAMUSCULAR | Status: AC
  Filled 2015-07-14: qty 1

## 2015-07-14 MED ORDER — HEPARIN SOD (PORK) LOCK FLUSH 100 UNIT/ML IV SOLN
500.0000 [IU] | Freq: Once | INTRAVENOUS | Status: AC | PRN
  Administered 2015-07-14: 500 [IU]
  Filled 2015-07-14: qty 5

## 2015-07-14 MED ORDER — ACETAMINOPHEN 325 MG PO TABS
ORAL_TABLET | ORAL | Status: AC
  Filled 2015-07-14: qty 2

## 2015-07-14 MED ORDER — SODIUM CHLORIDE 0.9 % IV SOLN
Freq: Once | INTRAVENOUS | Status: DC
Start: 2015-07-14 — End: 2015-07-14

## 2015-07-14 MED ORDER — ACETAMINOPHEN 325 MG PO TABS
650.0000 mg | ORAL_TABLET | Freq: Once | ORAL | Status: AC
  Administered 2015-07-14: 650 mg via ORAL

## 2015-07-14 MED ORDER — SODIUM CHLORIDE 0.9 % IV SOLN
50.0000 mg/m2 | Freq: Once | INTRAVENOUS | Status: AC
  Administered 2015-07-14: 94 mg via INTRAVENOUS
  Filled 2015-07-14: qty 94

## 2015-07-14 MED ORDER — PEGFILGRASTIM 6 MG/0.6ML ~~LOC~~ PSKT
6.0000 mg | PREFILLED_SYRINGE | Freq: Once | SUBCUTANEOUS | Status: AC
  Administered 2015-07-14: 6 mg via SUBCUTANEOUS
  Filled 2015-07-14: qty 0.6

## 2015-07-14 MED ORDER — SODIUM CHLORIDE 0.9 % IV SOLN
Freq: Once | INTRAVENOUS | Status: AC
  Administered 2015-07-14: 10:00:00 via INTRAVENOUS
  Filled 2015-07-14: qty 5

## 2015-07-14 MED ORDER — PALONOSETRON HCL INJECTION 0.25 MG/5ML
0.2500 mg | Freq: Once | INTRAVENOUS | Status: AC
Start: 2015-07-14 — End: 2015-07-14
  Administered 2015-07-14: 0.25 mg via INTRAVENOUS

## 2015-07-14 MED ORDER — SODIUM CHLORIDE 0.9 % IV SOLN
14.8000 mg/kg | Freq: Once | INTRAVENOUS | Status: AC
  Administered 2015-07-14: 1100 mg via INTRAVENOUS
  Filled 2015-07-14: qty 36

## 2015-07-14 MED ORDER — FAMOTIDINE IN NACL 20-0.9 MG/50ML-% IV SOLN
20.0000 mg | Freq: Once | INTRAVENOUS | Status: AC
Start: 2015-07-14 — End: 2015-07-14
  Administered 2015-07-14: 20 mg via INTRAVENOUS

## 2015-07-14 MED ORDER — PALONOSETRON HCL INJECTION 0.25 MG/5ML
INTRAVENOUS | Status: AC
  Filled 2015-07-14: qty 5

## 2015-07-14 NOTE — Patient Instructions (Signed)
[facility] Discharge Instructions for Patients Receiving Chemotherapy  Today you received the following chemotherapy agents: Cisplatin, Taxol, and Avastin.  To help prevent nausea and vomiting after your treatment, we encourage you to take your nausea medication.   If you develop nausea and vomiting that is not controlled by your nausea medication, call the clinic.   BELOW ARE SYMPTOMS THAT SHOULD BE REPORTED IMMEDIATELY:  *FEVER GREATER THAN 100.5 F  *CHILLS WITH OR WITHOUT FEVER  NAUSEA AND VOMITING THAT IS NOT CONTROLLED WITH YOUR NAUSEA MEDICATION  *UNUSUAL SHORTNESS OF BREATH  *UNUSUAL BRUISING OR BLEEDING  TENDERNESS IN MOUTH AND THROAT WITH OR WITHOUT PRESENCE OF ULCERS  *URINARY PROBLEMS  *BOWEL PROBLEMS  UNUSUAL RASH Items with * indicate a potential emergency and should be followed up as soon as possible.  Feel free to call the clinic you have any questions or concerns. The clinic phone number is [phone].  Please show the [facility] at check-in to the Emergency Department and triage nurse.

## 2015-07-14 NOTE — Patient Outreach (Signed)
[facility]) Care Management  07/14/2015  Katherine Boone [birth year] [id number]   Telephone Assessment   Outreach call to patient. No answer. RN CM left HIPAA compliant voicemail message along with contact info.   Plan: RN CM will make outreach attempt to patient within a week.   Enzo Montgomery, RN,BSN,CCM [facility] Management Telephonic Care Management Coordinator Direct Phone: [phone] Toll Free: [phone] Fax: [phone]

## 2015-07-15 ENCOUNTER — Other Ambulatory Visit: Payer: Self-pay | Admitting: *Deleted

## 2015-07-15 ENCOUNTER — Telehealth: Payer: Self-pay

## 2015-07-15 NOTE — Telephone Encounter (Signed)
LM requesting that Katherine Boone to call back to the office at [phone] if she is experiencing any difficulty drinking fluids, N/V, bowel issues as she may need to come in for IVF or be seen and evaluated.

## 2015-07-15 NOTE — Telephone Encounter (Signed)
-----   Message from Gordy Levan, MD sent at 07/09/2015 12:57 PM EDT ----- Please check with her by phone after second CDDP/ taxol and first avastin planned 07-14-15. She needs to drink fluids well, as was dehydrated after first CDDP taxol. Can give IVF ~ 5-13 or 5-15 if needed.  She plans to go to beach 5[place]  thanks

## 2015-07-15 NOTE — Patient Outreach (Signed)
[facility]) Care Management  07/15/2015  NAFISA LEMMER 07-14-1945 [id number]   Phone call to patient to provide resources to assist with her co-pays for her treatment. The possibility of applying for medicaid explored, however per patient she was making a good salary until her diagnosis a few months ago and based on current assets would not qualify.  She has spoke to the financial counselor at the Cancer center who has provided patient with a grant to pay for gas and assistance to pay her light bill.  Patient states that she has a positive network of friends and family that assist with food, and transportation to medical appointments.  This Education officer, museum provided patient with the phone number to Loren Racer, Social Worker's at the [employer] center for any other possible community resources that patient may benefit from.  Patient verbalized no additional needs at this time.  Patient's case to be closed to social work.    Sheralyn Boatman [facility] Care Management [phone]

## 2015-07-18 ENCOUNTER — Other Ambulatory Visit: Payer: Self-pay

## 2015-07-18 ENCOUNTER — Ambulatory Visit: Payer: Commercial Managed Care - HMO

## 2015-07-18 NOTE — Patient Outreach (Addendum)
[facility]) Care Management  07/18/2015   Katherine Boone April 29, 1945 [id number]  S/O: Outreach call to patient. She states that she is not feeling too well today. She has cancer treatment on last Thursday. She reports that normally for a few days after treatments she is weak,tired and fatigued. She denies any GI symptoms. Appetite remains fairly well-only eating two meals per day. She states that she does not like any of the nutritional supplements. Wgt remains stable at around 162 lbs. She goes back to Md for blood work on next week which will determine when her next treatment will be. Patient states she is going to the beach later this week with friends for a few days. She is taking time out to do the things she enjoys and not putting them off. Advance care planning discussed. Patient was not interested in conversation at this time.   Current Medications:  Current Outpatient Prescriptions  Medication Sig Dispense Refill  . alendronate (FOSAMAX) 70 MG tablet Take 1 tablet (70 mg total) by mouth every 7 (seven) days. Take with a full glass of water on an empty stomach. (Patient not taking: Reported on 05/26/2015) 4 tablet 11  . Calcium Carbonate-Vit D-Min 1200-1000 MG-UNIT CHEW Chew 1 tablet by mouth daily. (Patient not taking: Reported on 05/26/2015)    . Cholecalciferol (VITAMIN D3) 1000 UNITS CAPS Take 1 capsule (1,000 Units total) by mouth daily. (Patient not taking: Reported on 05/26/2015) 30 capsule   . cyanocobalamin 100 MCG tablet Take 100 mcg by mouth daily. Reported on 06/30/2015    . dexamethasone (DECADRON) 4 MG tablet Take 5 tablets (20mg ) with food 12 hours and 6 hours before taxol (Patient not taking: Reported on 06/30/2015) 20 tablet 1  . lidocaine-prilocaine (EMLA) cream Apply to Portacath  1-2 hrs prior to access. 30 g 1  . LORazepam (ATIVAN) 0.5 MG tablet Take 1 tablet (0.5 mg total) by mouth every 6 (six) hours as needed (nausea). Or under the tongue. Will make  drowsy. (Patient not taking: Reported on 06/30/2015) 20 tablet 0  . omeprazole (PRILOSEC) 20 MG capsule Take 1 capsule (20 mg total) by mouth daily. 30 capsule 3  . ondansetron (ZOFRAN) 8 MG tablet Take 1 tablet (8 mg total) by mouth every 8 (eight) hours as needed for nausea or vomiting. Will not make drowsy (Patient not taking: Reported on 06/30/2015) 30 tablet 0  . oxyCODONE (OXY IR/ROXICODONE) 5 MG immediate release tablet Take 1 tablet (5 mg total) by mouth every 6 (six) hours as needed for moderate pain. 15 tablet 0  . prochlorperazine (COMPAZINE) 10 MG tablet Take 1 tablet (10 mg total) by mouth every 6 (six) hours as needed for nausea or vomiting. (Patient not taking: Reported on 06/30/2015) 20 tablet 0  . temazepam (RESTORIL) 15 MG capsule Take 1 capsule (15 mg total) by mouth at bedtime as needed for sleep. 30 capsule 0  . valACYclovir (VALTREX) 1000 MG tablet Take 1 tablet (1,000 mg total) by mouth daily. Change to prophylactic dose after initial high dose. (Patient not taking: Reported on 06/30/2015) 30 tablet 1   No current facility-administered medications for this visit.   Facility-Administered Medications Ordered in Other Visits  Medication Dose Route Frequency Provider Last Rate Last Dose  . heparin lock flush 100 unit/mL  500 Units Intravenous Once Lennis P Livesay, MD      . sodium chloride flush (NS) 0.9 % injection 10 mL  10 mL Intravenous PRN Lennis Marion Downer, MD  Functional Status:  In your present state of health, do you have any difficulty performing the following activities: 07/18/2015 05/17/2015  Hearing? N N  Vision? N N  Difficulty concentrating or making decisions? N N  Walking or climbing stairs? N Y  Dressing or bathing? N N  Doing errands, shopping? N -  Preparing Food and eating ? N -  Using the Toilet? N -  In the past six months, have you accidently leaked urine? N -  Do you have problems with loss of bowel control? N -  Managing your Medications? N -   Managing your Finances? N -  Housekeeping or managing your Housekeeping? N -    Fall/Depression Screening: PHQ 2/9 Scores 06/24/2015 07/06/2014  PHQ - 2 Score 0 0   Fall Risk  06/24/2015 07/06/2014  Falls in the past year? No No  Risk for fall due to : Medication side effect -    Assessment:   THN CM Care Plan Problem One        Most Recent Value   Care Plan Problem One  -- [patient experiencing SEs from cancer treatments]   Role Documenting the Problem One  Care Management Telephonic Coordinator   Care Plan for Problem One  Active   THN Long Term Goal (31-90 days)  patient will have no hospital admissions within the next 31days.   THN Long Term Goal Start Date  07/18/15   Interventions for Problem One Long Term Goal  RN CM provided edcuation of SEs and ways to manage   THN CM Short Term Goal #1 (0-30 days)  patient will report an acceptable level of pain for her within the next 30days   THN CM Short Term Goal #1 Start Date  07/18/15   Interventions for Short Term Goal #1  RN CM provided education of improtance of adequate pain mgmt.   THN CM Short Term Goal #2 (0-30 days)  patient will maintain weight and avoid significant weight loss during treatments   THN CM Short Term Goal #2 Start Date  07/18/15   Interventions for Short Term Goal #2  RN CM educated patient on importance of adequate nutritional intake, supplements and mgmt of weight       Plan:  RN CM will make outreach call to patient within a month. RN CM confirmed patient is aware of when to seek medical care for changes in condition. RN CM will send barriers letter to MD.   Enzo Montgomery, RN,BSN,CCM [place] Management Telephonic Care Management Coordinator Direct Phone: [phone] Toll Free: [phone] Fax: [phone]

## 2015-07-19 NOTE — Telephone Encounter (Signed)
Spoke with Ms Ashok Croon and she increase her fluid intake yesterday to ~50 oz.  Used heat and took an OxyIR 5 mg lst evening and slept well.  She is less achy today.  She would prefer to drink the fluids at home.  She will push to take in 64 oz the next couple of days.  She will call back to Dr. Mariana Kaufman office if she needs to come in for IVF prior to beach trip 07-21-15.

## 2015-07-25 ENCOUNTER — Ambulatory Visit: Payer: Commercial Managed Care - HMO | Admitting: Oncology

## 2015-07-25 ENCOUNTER — Other Ambulatory Visit: Payer: Commercial Managed Care - HMO

## 2015-07-27 ENCOUNTER — Telehealth: Payer: Self-pay | Admitting: Oncology

## 2015-07-27 ENCOUNTER — Other Ambulatory Visit: Payer: Self-pay | Admitting: Oncology

## 2015-07-27 DIAGNOSIS — C539 Malignant neoplasm of cervix uteri, unspecified: Secondary | ICD-10-CM

## 2015-07-27 NOTE — Telephone Encounter (Signed)
Opened in error

## 2015-07-28 ENCOUNTER — Telehealth: Payer: Self-pay | Admitting: *Deleted

## 2015-07-28 ENCOUNTER — Ambulatory Visit: Payer: Commercial Managed Care - HMO

## 2015-07-28 ENCOUNTER — Ambulatory Visit (HOSPITAL_BASED_OUTPATIENT_CLINIC_OR_DEPARTMENT_OTHER): Payer: Commercial Managed Care - HMO | Admitting: Oncology

## 2015-07-28 ENCOUNTER — Other Ambulatory Visit (HOSPITAL_BASED_OUTPATIENT_CLINIC_OR_DEPARTMENT_OTHER): Payer: Commercial Managed Care - HMO

## 2015-07-28 ENCOUNTER — Telehealth: Payer: Self-pay | Admitting: Oncology

## 2015-07-28 VITALS — BP 121/76 | HR 77 | Temp 98.6°F | Resp 18 | Ht 67.0 in | Wt 162.2 lb

## 2015-07-28 DIAGNOSIS — C801 Malignant (primary) neoplasm, unspecified: Secondary | ICD-10-CM

## 2015-07-28 DIAGNOSIS — C7802 Secondary malignant neoplasm of left lung: Secondary | ICD-10-CM

## 2015-07-28 DIAGNOSIS — Z95828 Presence of other vascular implants and grafts: Secondary | ICD-10-CM

## 2015-07-28 DIAGNOSIS — C787 Secondary malignant neoplasm of liver and intrahepatic bile duct: Secondary | ICD-10-CM | POA: Diagnosis not present

## 2015-07-28 DIAGNOSIS — R112 Nausea with vomiting, unspecified: Secondary | ICD-10-CM

## 2015-07-28 DIAGNOSIS — C7989 Secondary malignant neoplasm of other specified sites: Secondary | ICD-10-CM

## 2015-07-28 DIAGNOSIS — C539 Malignant neoplasm of cervix uteri, unspecified: Secondary | ICD-10-CM

## 2015-07-28 DIAGNOSIS — C799 Secondary malignant neoplasm of unspecified site: Secondary | ICD-10-CM

## 2015-07-28 DIAGNOSIS — D701 Agranulocytosis secondary to cancer chemotherapy: Secondary | ICD-10-CM

## 2015-07-28 DIAGNOSIS — T451X5A Adverse effect of antineoplastic and immunosuppressive drugs, initial encounter: Secondary | ICD-10-CM

## 2015-07-28 DIAGNOSIS — C7801 Secondary malignant neoplasm of right lung: Secondary | ICD-10-CM | POA: Diagnosis not present

## 2015-07-28 DIAGNOSIS — B009 Herpesviral infection, unspecified: Secondary | ICD-10-CM

## 2015-07-28 LAB — COMPREHENSIVE METABOLIC PANEL
ALBUMIN: 3.4 g/dL — AB (ref 3.5–5.0)
ALK PHOS: 137 U/L (ref 40–150)
ALT: 14 U/L (ref 0–55)
AST: 15 U/L (ref 5–34)
Anion Gap: 7 mEq/L (ref 3–11)
BILIRUBIN TOTAL: 0.3 mg/dL (ref 0.20–1.20)
BUN: 9.2 mg/dL (ref 7.0–26.0)
CO2: 27 meq/L (ref 22–29)
Calcium: 8.8 mg/dL (ref 8.4–10.4)
Chloride: 110 mEq/L — ABNORMAL HIGH (ref 98–109)
Creatinine: 0.7 mg/dL (ref 0.6–1.1)
EGFR: 82 mL/min/{1.73_m2} — ABNORMAL LOW (ref >90)
GLUCOSE: 94 mg/dL (ref 70–140)
Potassium: 4.9 mEq/L (ref 3.5–5.1)
SODIUM: 143 meq/L (ref 136–145)
TOTAL PROTEIN: 6.9 g/dL (ref 6.4–8.3)

## 2015-07-28 LAB — CBC WITH DIFFERENTIAL/PLATELET
BASO%: 0.2 % (ref 0.0–2.0)
Basophils Absolute: 0 10*3/uL (ref 0.0–0.1)
EOS%: 1.1 % (ref 0.0–7.0)
Eosinophils Absolute: 0.1 10*3/uL (ref 0.0–0.5)
HCT: 34.6 % — ABNORMAL LOW (ref 34.8–46.6)
HEMOGLOBIN: 11.5 g/dL — AB (ref 11.6–15.9)
LYMPH%: 20 % (ref 14.0–49.7)
MCH: 29.3 pg (ref 25.1–34.0)
MCHC: 33.2 g/dL (ref 31.5–36.0)
MCV: 88.3 fL (ref 79.5–101.0)
MONO#: 0.7 10*3/uL (ref 0.1–0.9)
MONO%: 7.9 % (ref 0.0–14.0)
NEUT%: 70.8 % (ref 38.4–76.8)
NEUTROS ABS: 6.3 10*3/uL (ref 1.5–6.5)
Platelets: 128 10*3/uL — ABNORMAL LOW (ref 145–400)
RBC: 3.92 10*6/uL (ref 3.70–5.45)
RDW: 16.3 % — AB (ref 11.2–14.5)
WBC: 9 10*3/uL (ref 3.9–10.3)
lymph#: 1.8 10*3/uL (ref 0.9–3.3)

## 2015-07-28 LAB — MAGNESIUM: Magnesium: 1.5 mg/dl (ref 1.5–2.5)

## 2015-07-28 MED ORDER — SODIUM CHLORIDE 0.9 % IJ SOLN
10.0000 mL | INTRAMUSCULAR | Status: DC | PRN
  Filled 2015-07-28: qty 10

## 2015-07-28 MED ORDER — HEPARIN SOD (PORK) LOCK FLUSH 100 UNIT/ML IV SOLN
500.0000 [IU] | INTRAVENOUS | Status: DC | PRN
  Filled 2015-07-28: qty 5

## 2015-07-28 NOTE — Telephone Encounter (Signed)
Per staff message and POF I have scheduled appts. Advised scheduler of appts. JMW  

## 2015-07-28 NOTE — Telephone Encounter (Signed)
appt made and avs printed °

## 2015-07-28 NOTE — Progress Notes (Signed)
Pt had labs drawn from vein per request. TO be done by Lab staff

## 2015-07-30 NOTE — Progress Notes (Signed)
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 Cycle 1 was carbo taxol on 05-19-15, then regimen changed to CDDP taxol given 06-23-15, and cycle 3 continuing CDDP taxol with addition of avastin. She had neulasta by on pro injector, having been neutropenic with cycle 1 chemo.   Patient had difficult time for over a week following most recent chemotherapy, with fatigue, nausea and generalized severe aches which were all worst on days 5-6-7. She did not contact our office with those symptoms, and likely did not take po fluids optimally during that time. She used claritin and some ibuprofen for aches. Aches have essentially resolved, tho she is aware of some discomfort across upper back Prn antiemetics seemed somewhat helpful. Bowels moved via colostomy, which is still working well. She had some low abdominal discomfort earlier this week, now resolved. She has had NP cough x few days with sneezing, tho does not feel ill and denies SOB, chest pain. She has had no bleeding. PAC is fine. She has no peripheral neuropathy, no LE swelling.   Remainder of 10 point Review of Systems negative/ unchanged.    Flu vaccine 05-06-15 PAC by IR 06-10-15    ONCOLOGIC HISTORY Patient was in her usual very good health until new cramping abdominal pain intermittently for last few months. She had some constipation with this, particularly severe when she saw PCP for this reason on 04-26-15. She has had no vaginal bleeding. She had no bowel obstruction by xray and bowels moved with miralax. CT AP 04-26-15 had 10.3 x 13.8 mixed cystic and solid mass involving uterus and extending posteriorly into cul de sac and into bilateral adnexal regions, sigmoid colon  tethered to mass, no ascites, scattered necrotic lymph nodes in mesentery and omentum, no hydroureter, 3 lesions in liver up to 3.4 cm, and numerous bilateral pulmonary nodules in lung bases. CEA was 1.1 and CA 125 was 105; CBC and CMET 04-26-15 were entirely normal. 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 She had CT biopsy of pelvic mass in region of left psoas on 3-1-1 7. Pathology [phone], showed high grade poorly differentiated carcinoma with immunohistochemical stains suggesting cervical primary, which did not fit clinical picture well. CT chest 05-10-15 showed multiple bilateral pulmonary nodules (>40), 3 liver lesions up to 3.7 cm, left supraclavicular node and left hilar nodes. Chemotherapy was begun with first carboplatin and taxol on 05-12-15. Admitted with colonic obstruction 05-15-15, diverting sigmoid loop colostomy 05-18-15. Neutropenic 05-19-15 (day 8 cycle 1) despite granix begun day 5. Chemo changed to CDDP taxol with review of the pathology information, cycle 1 delayed due to inadequate IV access on 06-09-15. She had first CDDP taxol on 06-23-15 with on pro neulasta, no documented neutropenia and only mild thrombocytopenia (120k). Avastin was added to CDDP taxol with treatment 07-14-15.    Objective:  Vital signs in last 24 hours:  BP 121/76 mmHg  Pulse 77  Temp(Src) 98.6 F (37 C) (Oral)  Resp 18  Ht 5' 7"  (1.702 m)  Wt 162 lb 3.2 oz (73.573 kg)  BMI 25.40 kg/m2  SpO2 99% Weight down 1 lb. Alert, oriented and appropriate. Ambulatory without difficulty, quickly and easily mobile. Respirations not labored, no cough during visit.  Alopecia  HEENT:PERRL, sclerae not icteric. Oral mucosa moist without lesions, posterior pharynx clear. Nasal turbinates not remarkable. Does not sound congested.  Neck supple. No JVD.  Lymphatics:no cervical,supraclavicular adenopathy Resp: somewhat diminished BS without wheezes or crackles, and  normal percussion bilaterally Cardio: regular rate and rhythm. No gallop. GI: soft, nontender, not distended, no mass or organomegaly. Normally active bowel sounds. Soft brown stool in ostomy bag Musculoskeletal/ Extremities: without pitting edema, cords, tenderness Neuro: no peripheral neuropathy. Otherwise nonfocal. PSYCH appropriate mood and affect Skin without rash, ecchymosis, petechiae Portacath-without erythema or tenderness  Lab Results:  Results for orders placed or performed in visit on 07/28/15  CBC with Differential  Result Value Ref Range   WBC 9.0 3.9 - 10.3 10e3/uL   NEUT# 6.3 1.5 - 6.5 10e3/uL   HGB 11.5 (L) 11.6 - 15.9 g/dL   HCT 34.6 (L) 34.8 - 46.6 %   Platelets 128 (L) 145 - 400 10e3/uL   MCV 88.3 79.5 - 101.0 fL   MCH 29.3 25.1 - 34.0 pg   MCHC 33.2 31.5 - 36.0 g/dL   RBC 3.92 3.70 - 5.45 10e6/uL   RDW 16.3 (H) 11.2 - 14.5 %   lymph# 1.8 0.9 - 3.3 10e3/uL   MONO# 0.7 0.1 - 0.9 10e3/uL   Eosinophils Absolute 0.1 0.0 - 0.5 10e3/uL   Basophils Absolute 0.0 0.0 - 0.1 10e3/uL   NEUT% 70.8 38.4 - 76.8 %   LYMPH% 20.0 14.0 - 49.7 %   MONO% 7.9 0.0 - 14.0 %   EOS% 1.1 0.0 - 7.0 %   BASO% 0.2 0.0 - 2.0 %  Comprehensive metabolic panel  Result Value Ref Range   Sodium 143 136 - 145 mEq/L   Potassium 4.9 3.5 - 5.1 mEq/L   Chloride 110 (H) 98 - 109 mEq/L   CO2 27 22 - 29 mEq/L   Glucose 94 70 - 140 mg/dl   BUN 9.2 7.0 - 26.0 mg/dL   Creatinine 0.7 0.6 - 1.1 mg/dL   Total Bilirubin 0.30 0.20 - 1.20 mg/dL   Alkaline Phosphatase 137 40 - 150 U/L   AST 15 5 - 34 U/L   ALT 14 0 - 55 U/L   Total Protein 6.9 6.4 - 8.3 g/dL   Albumin 3.4 (L) 3.5 - 5.0 g/dL   Calcium 8.8 8.4 - 10.4 mg/dL   Anion Gap 7 3 - 11 mEq/L   EGFR 82 (L) >90 ml/min/1.73 m2  Magnesium  Result Value Ref Range   Magnesium 1.5 1.5 - 2.5 mg/dl     Studies/Results:  We have reviewed PACs images of most recent CT CAP, as I have explained again the extent of disease and fact that treatment is  in attempt to control the disease as best possible.   Medications: I have reviewed the patient's current medications. OK to hold fosamax, calcium, vitamins if difficult to take now  DISCUSSION Patient does not feel she is able to have next chemo on 6-1 on scheudule, as she wants to be able to feel better for a bit longer before repeat chemo side effects. She agrees with recommendation to have IVF on day 5, and should call us prior if having difficulty prior to that.   We have discussed repeat scans after next cycle, to be sure that disease is improving or at least stable given how rigorous this regimen is.   I have explained that keeping chemo on schedule may give most benefit, however she is trying to balance quality time  and wants to go back to beach before next treatment.  After discussion, she is in agreement with having next cycle (=cycle 3 CDDP Taxol/ cycle 2 avastin) on June 15, which is 2 week delay.  I will see her on ~ June 12 with labs then.    Assessment/Plan:  1. 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 Treatment is in attempt to shrink and control disease as best possible, not in curative attempt. Regimen now is CDDP taxol avastin, optimally to be used  q 3 weeks. She will be treated on 08-18-15 as long as ANC >=1.5, plt >=100k, renal function and bili adequate, urine protein within parameters. Aloxi and emend with other antiemetics. Will give IVF + IV antiemetics on day 5 given problems with most recent cycle. Neulasta by on pro likely adding to aches, but did support counts well. Sharren Bridge will repeat imaging after next treatment.  2.post diverting sigmoid loop colostomy 05-18-15 for distal bowel obstruction from pelvic malignancy. Ostomy functioning well. Start of avastin held to allow wound healing. 3.PAC by IR  4.chemo neutropenia with cycle 1 carbo taxol:  Had on pro neulasta with CDDP Taxol 06-23-15, no documented neutropenia and minimal decrease in platelets. 5.nutritional status: po intake better since colostomy, tho difficult for ~ a week after chemo. Encouraged her to do as well as possible between now and next chemo 6.perirectal HSV: continue prophylactic acyclovir as she will have chemo and will use steroids with taxol 7.environmental allergies, cough and clear rhinorrhea: may be environmental allergies (tho also has pulmonary mets). Claritin and flonase/  Nasocort 8.sleep problems:Restoril 15 mg effective now 9. No advance directives: continue to address. She does seem to have somewhat better understanding of extent of disease and goal of treatment.    All questions answered. Chemo orders for June 15 with on pro neulasta confirmed. IVF and IV antiemetics entered for June19.  She knows that she can call at any time if problems. Time spent 25 min including >50% counseling and coordination of care.    Gordy Levan, MD

## 2015-07-31 ENCOUNTER — Encounter: Payer: Self-pay | Admitting: Oncology

## 2015-08-05 ENCOUNTER — Telehealth: Payer: Self-pay | Admitting: *Deleted

## 2015-08-05 NOTE — Telephone Encounter (Signed)
Filled and in Kim's box. 

## 2015-08-05 NOTE — Telephone Encounter (Signed)
Order form for ostomy supplies in your IN box.

## 2015-08-08 ENCOUNTER — Other Ambulatory Visit: Payer: Self-pay

## 2015-08-08 NOTE — Patient Outreach (Signed)
[facility]) Care Management  08/08/2015  Katherine Boone 06/30/1945 [id number]    Telephone Assessment   Outreach attempt #1 to patient. No answer at present. RN CM left HIPAA compliant voicemail along with contact info.    Plan: RN CM will make outreach attempt to patient within a week.  Enzo Montgomery, RN,BSN,CCM [place] Management Telephonic Care Management Coordinator Direct Phone: [phone] Toll Free: [phone] Fax: [phone]

## 2015-08-08 NOTE — Patient Outreach (Signed)
[facility] [facility]) Care Management  08/08/2015  BRECKLYN MAUZY [birth year] [id number]   Telephone Assessment  Incoming call from patient returning RN CM call. She states she is doing fine. She was able to recently take her beach trip which se enjoyed. She goes for her next chemo appt on next 08/18/15. Patient states that she is "nosey" and wants to know everything. She has been debating rather or not to discuss with MD about getting scan done to see if treatments are making any difference with her cancer. She remains "torn" about what to do. Support given to patient. She voices realistic expectations and how treatments are "buying her time." Patient states she is not ready to die and desires to live. She even desires to be able to return to work. However, due to SEs from treatments she is unable to do so. She states she is having an ongoing dry nonproductive cough. Discussed s/s and when to alert MD. She states that her pulse ox has been 95-98% RA. She denies any SOB. Patient advised to contact [facility] for any needs or concerns. She voiced understanding.    Plan: RN CM will make outreach call to patient within a month.   Enzo Montgomery, RN,BSN,CCM [place] Management Telephonic Care Management Coordinator Direct Phone: [phone] Toll Free: [phone] Fax: [phone]

## 2015-08-10 DIAGNOSIS — Z933 Colostomy status: Secondary | ICD-10-CM | POA: Diagnosis not present

## 2015-08-10 DIAGNOSIS — Z85038 Personal history of other malignant neoplasm of large intestine: Secondary | ICD-10-CM | POA: Diagnosis not present

## 2015-08-10 NOTE — Telephone Encounter (Signed)
i don't remember this pt.

## 2015-08-10 NOTE — Telephone Encounter (Signed)
Do you remember if you faxed this? It's not in my box. Thanks!

## 2015-08-10 NOTE — Telephone Encounter (Signed)
Ok. Thanks!

## 2015-08-12 ENCOUNTER — Ambulatory Visit: Payer: Self-pay

## 2015-08-14 ENCOUNTER — Other Ambulatory Visit: Payer: Self-pay | Admitting: Oncology

## 2015-08-15 ENCOUNTER — Encounter: Payer: Self-pay | Admitting: Oncology

## 2015-08-15 ENCOUNTER — Other Ambulatory Visit (HOSPITAL_BASED_OUTPATIENT_CLINIC_OR_DEPARTMENT_OTHER): Payer: Commercial Managed Care - HMO

## 2015-08-15 ENCOUNTER — Ambulatory Visit (HOSPITAL_BASED_OUTPATIENT_CLINIC_OR_DEPARTMENT_OTHER): Payer: Commercial Managed Care - HMO | Admitting: Oncology

## 2015-08-15 VITALS — BP 119/78 | HR 80 | Temp 98.2°F | Resp 18 | Ht 67.0 in | Wt 160.6 lb

## 2015-08-15 DIAGNOSIS — C539 Malignant neoplasm of cervix uteri, unspecified: Secondary | ICD-10-CM

## 2015-08-15 DIAGNOSIS — C801 Malignant (primary) neoplasm, unspecified: Secondary | ICD-10-CM | POA: Diagnosis not present

## 2015-08-15 DIAGNOSIS — C799 Secondary malignant neoplasm of unspecified site: Secondary | ICD-10-CM

## 2015-08-15 DIAGNOSIS — C7989 Secondary malignant neoplasm of other specified sites: Secondary | ICD-10-CM

## 2015-08-15 DIAGNOSIS — T451X5A Adverse effect of antineoplastic and immunosuppressive drugs, initial encounter: Secondary | ICD-10-CM

## 2015-08-15 DIAGNOSIS — B009 Herpesviral infection, unspecified: Secondary | ICD-10-CM

## 2015-08-15 DIAGNOSIS — C7801 Secondary malignant neoplasm of right lung: Secondary | ICD-10-CM

## 2015-08-15 DIAGNOSIS — C7802 Secondary malignant neoplasm of left lung: Secondary | ICD-10-CM

## 2015-08-15 DIAGNOSIS — C787 Secondary malignant neoplasm of liver and intrahepatic bile duct: Secondary | ICD-10-CM

## 2015-08-15 DIAGNOSIS — Z95828 Presence of other vascular implants and grafts: Secondary | ICD-10-CM

## 2015-08-15 DIAGNOSIS — R112 Nausea with vomiting, unspecified: Secondary | ICD-10-CM

## 2015-08-15 DIAGNOSIS — D701 Agranulocytosis secondary to cancer chemotherapy: Secondary | ICD-10-CM

## 2015-08-15 LAB — COMPREHENSIVE METABOLIC PANEL
ALT: 16 U/L (ref 0–55)
AST: 18 U/L (ref 5–34)
Albumin: 3.7 g/dL (ref 3.5–5.0)
Alkaline Phosphatase: 105 U/L (ref 40–150)
Anion Gap: 7 mEq/L (ref 3–11)
BUN: 16 mg/dL (ref 7.0–26.0)
CHLORIDE: 108 meq/L (ref 98–109)
CO2: 25 meq/L (ref 22–29)
CREATININE: 0.9 mg/dL (ref 0.6–1.1)
Calcium: 9.2 mg/dL (ref 8.4–10.4)
EGFR: 68 mL/min/{1.73_m2} — ABNORMAL LOW (ref >90)
GLUCOSE: 108 mg/dL (ref 70–140)
POTASSIUM: 4.6 meq/L (ref 3.5–5.1)
SODIUM: 139 meq/L (ref 136–145)
Total Bilirubin: 0.42 mg/dL (ref 0.20–1.20)
Total Protein: 7.2 g/dL (ref 6.4–8.3)

## 2015-08-15 LAB — CBC WITH DIFFERENTIAL/PLATELET
BASO%: 0.8 % (ref 0.0–2.0)
BASOS ABS: 0.1 10*3/uL (ref 0.0–0.1)
EOS%: 2.9 % (ref 0.0–7.0)
Eosinophils Absolute: 0.2 10*3/uL (ref 0.0–0.5)
HCT: 34.8 % (ref 34.8–46.6)
HGB: 11.7 g/dL (ref 11.6–15.9)
LYMPH#: 1.8 10*3/uL (ref 0.9–3.3)
LYMPH%: 28.4 % (ref 14.0–49.7)
MCH: 30.2 pg (ref 25.1–34.0)
MCHC: 33.6 g/dL (ref 31.5–36.0)
MCV: 90.1 fL (ref 79.5–101.0)
MONO#: 0.6 10*3/uL (ref 0.1–0.9)
MONO%: 8.7 % (ref 0.0–14.0)
NEUT#: 3.8 10*3/uL (ref 1.5–6.5)
NEUT%: 59.2 % (ref 38.4–76.8)
Platelets: 173 10*3/uL (ref 145–400)
RBC: 3.87 10*6/uL (ref 3.70–5.45)
RDW: 17.5 % — ABNORMAL HIGH (ref 11.2–14.5)
WBC: 6.4 10*3/uL (ref 3.9–10.3)

## 2015-08-15 LAB — UA PROTEIN, DIPSTICK - CHCC: Protein, ur: NEGATIVE mg/dL

## 2015-08-15 LAB — MAGNESIUM: MAGNESIUM: 1.6 mg/dL (ref 1.5–2.5)

## 2015-08-15 NOTE — Progress Notes (Signed)
OFFICE PROGRESS NOTE   August 15, 2015   Physicians: D.ClarkePearson, [name] (PCP), Leighton Ruff  INTERVAL HISTORY:   Patient is seen, alone for visit, continuing treatment for metastatic cervical vs endocervical carcinoma, having had cycle 2 CDDP taxol Avastin on 07-14-15 (this first avastin) with on pro neulasta. Patient had a first cycle of carbo taxol on 05-19-15. She requested delay of cycle 3 to allow her to feel well for other activities that she prioritized. Plan is for cycle 3 CDDP taxol avastin on 08-18-15 with on pro neulasta and IVF on 6-19, then restaging CT CAP.  Patient reports feeling very badly for a week after last treatment, including taxol and neulasta aches, GI symptoms, fatigue. She has felt much better for past 2 weeks, with no aches or nausea, appetite excellent, only a few twinges of pain left groin, no bleeding, on LE swelling, minimal throat-clearing cough and no SOB or chest pain. She does feel tired with activity, however this activity has been staining her deck for several hours at a time. No problems with PAC. Colostomy working well, stools more liquid for last few days. No fever or symptoms of infection. No peripheral neuropathy Remainder of 10 point Review of Systems negative   PAC by IR 06-10-15  ONCOLOGIC HISTORY Patient was in her usual very good health until new cramping abdominal pain intermittently for last few months. She had some constipation with this, particularly severe when she saw PCP for this reason on 04-26-15. She has had no vaginal bleeding. She had no bowel obstruction by xray and bowels moved with miralax. CT AP 04-26-15 had 10.3 x 13.8 mixed cystic and solid mass involving uterus and extending posteriorly into cul de sac and into bilateral adnexal regions, sigmoid colon tethered to mass, no ascites, scattered necrotic lymph nodes in mesentery and omentum, no hydroureter, 3 lesions in liver up to 3.4 cm, and numerous bilateral pulmonary  nodules in lung bases. CEA was 1.1 and CA 125 was 105; CBC and CMET 04-26-15 were entirely normal. 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 She had CT biopsy of pelvic mass in region of left psoas on 3-1-1 7. Pathology [id number], showed high grade poorly differentiated carcinoma with immunohistochemical stains suggesting cervical primary, which did not fit clinical picture well. CT chest 05-10-15 showed multiple bilateral pulmonary nodules (>40), 3 liver lesions up to 3.7 cm, left supraclavicular node and left hilar nodes. Chemotherapy was begun with first carboplatin and taxol on 05-12-15. Admitted with colonic obstruction 05-15-15, diverting sigmoid loop colostomy 05-18-15. Neutropenic 05-19-15 (day 8 cycle 1) despite granix begun day 5. Chemo changed to CDDP taxol with review of the pathology information, cycle 1 delayed due to inadequate IV access on 06-09-15. She had first CDDP taxol on 06-23-15 with on pro neulasta, no documented neutropenia and only mild thrombocytopenia (120k). Avastin was added to CDDP taxol with treatment 07-14-15.   .  Objective:  Vital signs in last 24 hours:  BP 119/78 mmHg  Pulse 80  Temp(Src) 98.2 F (36.8 C) (Oral)  Resp 18  Ht 5' 7"  (1.702 m)  Wt 160 lb 9.6 oz (72.848 kg)  BMI 25.15 kg/m2  SpO2 98% Weight down 2 lbs Alert, oriented and appropriate. Ambulatory without difficulty. Respirations not labored, no cough. Looks comfortable and well nourished. Complete alopecia  HEENT:PERRL, sclerae not icteric. Oral mucosa moist without lesions, posterior pharynx clear.  Neck supple. No JVD.  Lymphatics:Soft left supraclavicular fullness, no  cervical, axillary or inguinal adenopathy Resp: clear to auscultation bilaterally and normal percussion bilaterally Cardio: regular rate and rhythm. No gallop. GI: soft, nontender including LLQ and inguinal area, not distended, no mass or organomegaly. Normally active bowel  sounds. Stoma not remarkable, minimal soft stool in ostomy bag without apparent blood Musculoskeletal/ Extremities: without pitting edema, cords, tenderness Neuro: no peripheral neuropathy. Otherwise nonfocal. PSYCH appropriate mood and affect Skin without rash, ecchymosis, petechiae Portacath-without erythema or tenderness  Lab Results:  Results for orders placed or performed in visit on 08/15/15  CBC with Differential  Result Value Ref Range   WBC 6.4 3.9 - 10.3 10e3/uL   NEUT# 3.8 1.5 - 6.5 10e3/uL   HGB 11.7 11.6 - 15.9 g/dL   HCT 34.8 34.8 - 46.6 %   Platelets 173 145 - 400 10e3/uL   MCV 90.1 79.5 - 101.0 fL   MCH 30.2 25.1 - 34.0 pg   MCHC 33.6 31.5 - 36.0 g/dL   RBC 3.87 3.70 - 5.45 10e6/uL   RDW 17.5 (H) 11.2 - 14.5 %   lymph# 1.8 0.9 - 3.3 10e3/uL   MONO# 0.6 0.1 - 0.9 10e3/uL   Eosinophils Absolute 0.2 0.0 - 0.5 10e3/uL   Basophils Absolute 0.1 0.0 - 0.1 10e3/uL   NEUT% 59.2 38.4 - 76.8 %   LYMPH% 28.4 14.0 - 49.7 %   MONO% 8.7 0.0 - 14.0 %   EOS% 2.9 0.0 - 7.0 %   BASO% 0.8 0.0 - 2.0 %  Comprehensive metabolic panel  Result Value Ref Range   Sodium 139 136 - 145 mEq/L   Potassium 4.6 3.5 - 5.1 mEq/L   Chloride 108 98 - 109 mEq/L   CO2 25 22 - 29 mEq/L   Glucose 108 70 - 140 mg/dl   BUN 16.0 7.0 - 26.0 mg/dL   Creatinine 0.9 0.6 - 1.1 mg/dL   Total Bilirubin 0.42 0.20 - 1.20 mg/dL   Alkaline Phosphatase 105 40 - 150 U/L   AST 18 5 - 34 U/L   ALT 16 0 - 55 U/L   Total Protein 7.2 6.4 - 8.3 g/dL   Albumin 3.7 3.5 - 5.0 g/dL   Calcium 9.2 8.4 - 10.4 mg/dL   Anion Gap 7 3 - 11 mEq/L   EGFR 68 (L) >90 ml/min/1.73 m2  Magnesium  Result Value Ref Range   Magnesium 1.6 1.5 - 2.5 mg/dl  Urine protein by dipstick  Result Value Ref Range   Protein, ur Negative Negative- <30 mg/dL   CBC and urine protein results reviewed with patient at time of visit  Studies/Results:  No results found.  CT CAP requested for ~ July 10, timing to allow beach trip in early  July  Medications: I have reviewed the patient's current medications. Should be on acyclovir 400 mg bid preventative for genital herpes during chemo/ steroids.   DISCUSSION Interval history discussed as above. Discussed chemo, avastin and neulasta side effects. She is aware of necessary prehydration for CDDP. Patient not looking forward to treatment side effects but is willing to have cycle 3 CDDP taxol/ cycle 2 avastin on 08-18-15 as planned. She agrees to IVF on 6-19, NS 1 liter under sign and held orders with zofran and decadron.  She wants to go to beach either 6-29 or 6-30 thru week of July 4. I have adjusted my office schedule to see her with labs AM 6-29, then will get CT week of July 10 prior to return visit to MD on  July 13. Clinically she is doing very well, so we are hopeful that imaging will show good response or at least stable disease.  She knows to call prior to scheduled appointments if needed.    Assessment/Plan:  1. 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 Treatment is in attempt to shrink and control disease as best possible, not in curative attempt. Regimen now is CDDP taxol avastin, optimally to be used q 3 weeks. Labs good today to treat on 08-18-15  Aloxi and emend with other antiemetics. Will give IVF + IV antiemetics on day 5 given problems with prior cycles. Neulasta by on pro likely adding to aches, but did support counts well. Repeat imaging with CT CAP early July after this treatment.  2.post diverting sigmoid loop colostomy 05-18-15 for distal bowel obstruction from pelvic malignancy. Ostomy functioning well. (Start of avastin held to allow wound healing, first avastin 07-14-15). 3.PAC by IR  4.chemo neutropenia with cycle 1 carbo taxol: Had on pro neulasta with CDDP Taxol 06-23-15, no documented neutropenia and minimal decrease in  platelets. 5.nutritional status: po intake better since colostomy, tho difficult for ~ a week after chemo. Has been very good last 2 weeks. 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 RN to speak with her by phone about this medication as we did not address during visit 7.sleep problems:Restoril 15 mg effective now 8. No advance directives: continue to address. She does seem to have better understanding of extent of disease and goal of treatment; we are adjusting schedule at this office to allow her beach trips etc. Note patient cared for son with chronic medical problems (I believe cerebral palsy) until his death 42 years ago; daughter also deceased.   All questions answered and patient is in agreement with recommendations and plans. Chemo, avastin, neulasta, IVF, antiemetics orders placed and confirmed. CTs ordered, MD schedule adjusted for next visit. Msg to RN. Time spent 30 min including >50% counseling and coordination of care. Route PCP; I will update Dr Josephina Shih after upcoming scans.    Gordy Levan, MD   08/15/2015, 4:41 PM

## 2015-08-15 NOTE — Telephone Encounter (Signed)
This was faxed 08/08/15. In chart.

## 2015-08-16 ENCOUNTER — Telehealth: Payer: Self-pay

## 2015-08-16 ENCOUNTER — Telehealth: Payer: Self-pay | Admitting: Oncology

## 2015-08-16 DIAGNOSIS — B0089 Other herpesviral infection: Secondary | ICD-10-CM

## 2015-08-16 MED ORDER — ACYCLOVIR 400 MG PO TABS: 400.0000 mg | ORAL_TABLET | Freq: Two times a day (BID) | ORAL | Status: DC

## 2015-08-16 NOTE — Telephone Encounter (Signed)
Reviewed pre hydration for CDDP and decadron premedication for Taxol for 08-18-15. Sent prescription in for prophylactic Acyclovir with HX herpetic rash on sacrum Gave appointment date and time for visit on 09-01-15 and that CT scan to be scheduled ~09-12-15. Scheduling to notify  Her of CT scan appointment. Katherine Boone verbalized understanding.

## 2015-08-16 NOTE — Telephone Encounter (Signed)
appt made per LL 6/12 pof

## 2015-08-16 NOTE — Telephone Encounter (Signed)
-----   Message from Gordy Levan, MD sent at 08/16/2015  9:23 AM EDT ----- For Rx 6-15. Please remind her of oral prehydration for CDDP. Let her know that I have moved a patient AM 6-29 so that I can check her and labs before her beach trip, and asked schedulers to set up CT on ~ 7-10 prior to MD visit on 7-13. thanks

## 2015-08-16 NOTE — Telephone Encounter (Signed)
Patient Demographics     Patient Name Sex DOB SSN Address Phone    Katherine Boone, Katherine Boone Female August 06, 1945 [id number] [place]  [id number] [phone] (Home) *Preferred* [phone] (Mobile)      Message  Received: Today    Gordy Levan, MD  Baruch Merl, RN           Sorry, message 2 if you get this before you speak to her, please remind her of taxol premed steroids  + acyclovir 400 mg bid as we discussed  thanks

## 2015-08-18 ENCOUNTER — Ambulatory Visit: Payer: Commercial Managed Care - HMO

## 2015-08-18 ENCOUNTER — Ambulatory Visit (HOSPITAL_BASED_OUTPATIENT_CLINIC_OR_DEPARTMENT_OTHER): Payer: Commercial Managed Care - HMO

## 2015-08-18 VITALS — BP 140/80 | HR 62 | Temp 98.1°F | Resp 18

## 2015-08-18 DIAGNOSIS — Z5112 Encounter for antineoplastic immunotherapy: Secondary | ICD-10-CM | POA: Diagnosis not present

## 2015-08-18 DIAGNOSIS — C7989 Secondary malignant neoplasm of other specified sites: Secondary | ICD-10-CM

## 2015-08-18 DIAGNOSIS — C7801 Secondary malignant neoplasm of right lung: Secondary | ICD-10-CM

## 2015-08-18 DIAGNOSIS — Z5111 Encounter for antineoplastic chemotherapy: Secondary | ICD-10-CM | POA: Diagnosis not present

## 2015-08-18 DIAGNOSIS — C787 Secondary malignant neoplasm of liver and intrahepatic bile duct: Secondary | ICD-10-CM

## 2015-08-18 DIAGNOSIS — C7802 Secondary malignant neoplasm of left lung: Secondary | ICD-10-CM

## 2015-08-18 DIAGNOSIS — C801 Malignant (primary) neoplasm, unspecified: Secondary | ICD-10-CM

## 2015-08-18 DIAGNOSIS — C799 Secondary malignant neoplasm of unspecified site: Secondary | ICD-10-CM

## 2015-08-18 DIAGNOSIS — C539 Malignant neoplasm of cervix uteri, unspecified: Secondary | ICD-10-CM

## 2015-08-18 MED ORDER — SODIUM CHLORIDE 0.9% FLUSH
10.0000 mL | INTRAVENOUS | Status: DC | PRN
  Administered 2015-08-18: 10 mL
  Filled 2015-08-18: qty 10

## 2015-08-18 MED ORDER — PEGFILGRASTIM 6 MG/0.6ML ~~LOC~~ PSKT
6.0000 mg | PREFILLED_SYRINGE | Freq: Once | SUBCUTANEOUS | Status: AC
Start: 2015-08-18 — End: 2015-08-18
  Administered 2015-08-18: 6 mg via SUBCUTANEOUS
  Filled 2015-08-18: qty 0.6

## 2015-08-18 MED ORDER — PALONOSETRON HCL INJECTION 0.25 MG/5ML
0.2500 mg | Freq: Once | INTRAVENOUS | Status: AC
  Administered 2015-08-18: 0.25 mg via INTRAVENOUS

## 2015-08-18 MED ORDER — BEVACIZUMAB CHEMO INJECTION 400 MG/16ML
14.9000 mg/kg | Freq: Once | INTRAVENOUS | Status: AC
  Administered 2015-08-18: 1100 mg via INTRAVENOUS
  Filled 2015-08-18: qty 44

## 2015-08-18 MED ORDER — ACETAMINOPHEN 325 MG PO TABS
650.0000 mg | ORAL_TABLET | Freq: Once | ORAL | Status: AC
  Administered 2015-08-18: 650 mg via ORAL

## 2015-08-18 MED ORDER — FAMOTIDINE IN NACL 20-0.9 MG/50ML-% IV SOLN
INTRAVENOUS | Status: AC
  Filled 2015-08-18: qty 50

## 2015-08-18 MED ORDER — SODIUM CHLORIDE 0.9 % IV SOLN
Freq: Once | INTRAVENOUS | Status: AC
  Administered 2015-08-18: 12:00:00 via INTRAVENOUS
  Filled 2015-08-18: qty 5

## 2015-08-18 MED ORDER — FAMOTIDINE IN NACL 20-0.9 MG/50ML-% IV SOLN
20.0000 mg | Freq: Once | INTRAVENOUS | Status: AC
  Administered 2015-08-18: 20 mg via INTRAVENOUS

## 2015-08-18 MED ORDER — PALONOSETRON HCL INJECTION 0.25 MG/5ML
INTRAVENOUS | Status: AC
  Filled 2015-08-18: qty 5

## 2015-08-18 MED ORDER — HEPARIN SOD (PORK) LOCK FLUSH 100 UNIT/ML IV SOLN
500.0000 [IU] | Freq: Once | INTRAVENOUS | Status: AC | PRN
  Administered 2015-08-18: 500 [IU]
  Filled 2015-08-18: qty 5

## 2015-08-18 MED ORDER — DIPHENHYDRAMINE HCL 50 MG/ML IJ SOLN
50.0000 mg | Freq: Once | INTRAMUSCULAR | Status: AC
  Administered 2015-08-18: 50 mg via INTRAVENOUS

## 2015-08-18 MED ORDER — ACETAMINOPHEN 325 MG PO TABS
ORAL_TABLET | ORAL | Status: AC
  Filled 2015-08-18: qty 2

## 2015-08-18 MED ORDER — DIPHENHYDRAMINE HCL 50 MG/ML IJ SOLN
INTRAMUSCULAR | Status: AC
  Filled 2015-08-18: qty 1

## 2015-08-18 MED ORDER — SODIUM CHLORIDE 0.9 % IV SOLN
50.0000 mg/m2 | Freq: Once | INTRAVENOUS | Status: AC
  Administered 2015-08-18: 94 mg via INTRAVENOUS
  Filled 2015-08-18: qty 94

## 2015-08-18 MED ORDER — POTASSIUM CHLORIDE 2 MEQ/ML IV SOLN
Freq: Once | INTRAVENOUS | Status: AC
  Administered 2015-08-18: 09:00:00 via INTRAVENOUS
  Filled 2015-08-18: qty 10

## 2015-08-18 MED ORDER — SODIUM CHLORIDE 0.9 % IV SOLN
Freq: Once | INTRAVENOUS | Status: AC
  Administered 2015-08-18: 11:00:00 via INTRAVENOUS

## 2015-08-18 MED ORDER — SODIUM CHLORIDE 0.9 % IV SOLN
135.0000 mg/m2 | Freq: Once | INTRAVENOUS | Status: AC
  Administered 2015-08-18: 252 mg via INTRAVENOUS
  Filled 2015-08-18: qty 42

## 2015-08-18 NOTE — Patient Instructions (Signed)
[facility] Discharge Instructions for Patients Receiving Chemotherapy  Today you received the following chemotherapy agents: Cisplatin, Taxol, and Avastin.  To help prevent nausea and vomiting after your treatment, we encourage you to take your nausea medication.   If you develop nausea and vomiting that is not controlled by your nausea medication, call the clinic.   BELOW ARE SYMPTOMS THAT SHOULD BE REPORTED IMMEDIATELY:  *FEVER GREATER THAN 100.5 F  *CHILLS WITH OR WITHOUT FEVER  NAUSEA AND VOMITING THAT IS NOT CONTROLLED WITH YOUR NAUSEA MEDICATION  *UNUSUAL SHORTNESS OF BREATH  *UNUSUAL BRUISING OR BLEEDING  TENDERNESS IN MOUTH AND THROAT WITH OR WITHOUT PRESENCE OF ULCERS  *URINARY PROBLEMS  *BOWEL PROBLEMS  UNUSUAL RASH Items with * indicate a potential emergency and should be followed up as soon as possible.  Feel free to call the clinic you have any questions or concerns. The clinic phone number is [phone].  Please show the [facility] at check-in to the Emergency Department and triage nurse.

## 2015-08-22 ENCOUNTER — Other Ambulatory Visit: Payer: Self-pay | Admitting: *Deleted

## 2015-08-22 ENCOUNTER — Ambulatory Visit (HOSPITAL_BASED_OUTPATIENT_CLINIC_OR_DEPARTMENT_OTHER): Payer: Commercial Managed Care - HMO

## 2015-08-22 VITALS — BP 118/68 | HR 79 | Temp 98.5°F

## 2015-08-22 DIAGNOSIS — C7802 Secondary malignant neoplasm of left lung: Secondary | ICD-10-CM

## 2015-08-22 DIAGNOSIS — C7989 Secondary malignant neoplasm of other specified sites: Secondary | ICD-10-CM

## 2015-08-22 DIAGNOSIS — C787 Secondary malignant neoplasm of liver and intrahepatic bile duct: Secondary | ICD-10-CM | POA: Diagnosis not present

## 2015-08-22 DIAGNOSIS — R112 Nausea with vomiting, unspecified: Secondary | ICD-10-CM

## 2015-08-22 DIAGNOSIS — C801 Malignant (primary) neoplasm, unspecified: Secondary | ICD-10-CM | POA: Diagnosis not present

## 2015-08-22 DIAGNOSIS — C7801 Secondary malignant neoplasm of right lung: Secondary | ICD-10-CM

## 2015-08-22 DIAGNOSIS — T451X5A Adverse effect of antineoplastic and immunosuppressive drugs, initial encounter: Secondary | ICD-10-CM

## 2015-08-22 DIAGNOSIS — C799 Secondary malignant neoplasm of unspecified site: Secondary | ICD-10-CM

## 2015-08-22 DIAGNOSIS — C539 Malignant neoplasm of cervix uteri, unspecified: Secondary | ICD-10-CM

## 2015-08-22 MED ORDER — SODIUM CHLORIDE 0.9 % IJ SOLN
10.0000 mL | INTRAMUSCULAR | Status: AC | PRN
  Administered 2015-08-22: 10 mL
  Filled 2015-08-22: qty 10

## 2015-08-22 MED ORDER — HEPARIN SOD (PORK) LOCK FLUSH 100 UNIT/ML IV SOLN
500.0000 [IU] | INTRAVENOUS | Status: AC | PRN
  Administered 2015-08-22: 500 [IU]
  Filled 2015-08-22: qty 5

## 2015-08-22 MED ORDER — SODIUM CHLORIDE 0.9 % IV SOLN
INTRAVENOUS | Status: DC
  Administered 2015-08-22: 13:00:00 via INTRAVENOUS

## 2015-08-22 NOTE — Patient Instructions (Signed)

## 2015-08-28 ENCOUNTER — Other Ambulatory Visit: Payer: Self-pay | Admitting: Oncology

## 2015-08-28 DIAGNOSIS — C539 Malignant neoplasm of cervix uteri, unspecified: Secondary | ICD-10-CM

## 2015-09-01 ENCOUNTER — Encounter: Payer: Self-pay | Admitting: Oncology

## 2015-09-01 ENCOUNTER — Other Ambulatory Visit (HOSPITAL_BASED_OUTPATIENT_CLINIC_OR_DEPARTMENT_OTHER): Payer: Commercial Managed Care - HMO

## 2015-09-01 ENCOUNTER — Ambulatory Visit (HOSPITAL_BASED_OUTPATIENT_CLINIC_OR_DEPARTMENT_OTHER): Payer: Commercial Managed Care - HMO | Admitting: Oncology

## 2015-09-01 VITALS — BP 105/59 | HR 68 | Temp 98.2°F | Resp 18 | Ht 67.0 in | Wt 161.7 lb

## 2015-09-01 DIAGNOSIS — C701 Malignant neoplasm of spinal meninges: Secondary | ICD-10-CM

## 2015-09-01 DIAGNOSIS — C787 Secondary malignant neoplasm of liver and intrahepatic bile duct: Secondary | ICD-10-CM | POA: Diagnosis not present

## 2015-09-01 DIAGNOSIS — C7802 Secondary malignant neoplasm of left lung: Secondary | ICD-10-CM

## 2015-09-01 DIAGNOSIS — C7801 Secondary malignant neoplasm of right lung: Secondary | ICD-10-CM | POA: Diagnosis not present

## 2015-09-01 DIAGNOSIS — C801 Malignant (primary) neoplasm, unspecified: Secondary | ICD-10-CM

## 2015-09-01 DIAGNOSIS — C539 Malignant neoplasm of cervix uteri, unspecified: Secondary | ICD-10-CM

## 2015-09-01 DIAGNOSIS — B009 Herpesviral infection, unspecified: Secondary | ICD-10-CM

## 2015-09-01 LAB — COMPREHENSIVE METABOLIC PANEL
ALBUMIN: 3.5 g/dL (ref 3.5–5.0)
ALK PHOS: 130 U/L (ref 40–150)
ALT: 15 U/L (ref 0–55)
ANION GAP: 9 meq/L (ref 3–11)
AST: 15 U/L (ref 5–34)
BUN: 11.4 mg/dL (ref 7.0–26.0)
CO2: 26 meq/L (ref 22–29)
CREATININE: 0.9 mg/dL (ref 0.6–1.1)
Calcium: 8.9 mg/dL (ref 8.4–10.4)
Chloride: 107 mEq/L (ref 98–109)
EGFR: 67 mL/min/{1.73_m2} — AB (ref >90)
GLUCOSE: 110 mg/dL (ref 70–140)
Potassium: 4.2 mEq/L (ref 3.5–5.1)
SODIUM: 142 meq/L (ref 136–145)
TOTAL PROTEIN: 6.9 g/dL (ref 6.4–8.3)

## 2015-09-01 LAB — CBC WITH DIFFERENTIAL/PLATELET
BASO%: 0.1 % (ref 0.0–2.0)
BASOS ABS: 0 10*3/uL (ref 0.0–0.1)
EOS ABS: 0.2 10*3/uL (ref 0.0–0.5)
EOS%: 2.2 % (ref 0.0–7.0)
HCT: 33.9 % — ABNORMAL LOW (ref 34.8–46.6)
HEMOGLOBIN: 11.4 g/dL — AB (ref 11.6–15.9)
LYMPH#: 1.6 10*3/uL (ref 0.9–3.3)
LYMPH%: 23.5 % (ref 14.0–49.7)
MCH: 30.6 pg (ref 25.1–34.0)
MCHC: 33.6 g/dL (ref 31.5–36.0)
MCV: 90.9 fL (ref 79.5–101.0)
MONO#: 0.4 10*3/uL (ref 0.1–0.9)
MONO%: 6.5 % (ref 0.0–14.0)
NEUT#: 4.6 10*3/uL (ref 1.5–6.5)
NEUT%: 67.7 % (ref 38.4–76.8)
PLATELETS: 126 10*3/uL — AB (ref 145–400)
RBC: 3.73 10*6/uL (ref 3.70–5.45)
RDW: 14.8 % — AB (ref 11.2–14.5)
WBC: 6.8 10*3/uL (ref 3.9–10.3)

## 2015-09-01 LAB — MAGNESIUM: Magnesium: 1.5 mg/dl (ref 1.5–2.5)

## 2015-09-01 NOTE — Progress Notes (Signed)
OFFICE PROGRESS NOTE   September 01, 2015   Physicians: D.ClarkePearson, [name] (PCP), Leighton Ruff  INTERVAL HISTORY:   Patient is seen, alone for visit, in continuing attention to treatment in process for high grade cervical vs endocervical carcinoma metastatic in pelvis and to lungs and liver. She had first treatment with carbo taxol, and has now had 3 cycles of CDDP taxol avastin thru 08-18-15, with on pro neulasta support. She had IVF on 6-19.  Plan is for restaging scans prior to further treatment.   Patient felt generally weak for a full week after the treatment, with lightheadedness on standing despite pushing po fluids including gatorade. Note received IVF also on day 5. She had no nausea and less severe aches with most recent treatment. Bowels continued to move well via ostomy, appetite is good now, weakness has resolved and she denies any peripheral neuropathy or any bleeding. She has had no fever or symptoms of infection. She denies SOB, cough, hemoptysis, chest pain. She has no abdominal or pelvic pain. No LE swelling. No problems with PAC. Remainder of 10 point Review of Systems negative.    PAC in  She plans to go to beach this afternoon thru ~ 7-9 or 7-10. She prefers CT on 7-11.   ONCOLOGIC HISTORY Patient was in her usual very good health until new cramping abdominal pain intermittently for last few months. She had some constipation with this, particularly severe when she saw PCP for this reason on 04-26-15. She has had no vaginal bleeding. She had no bowel obstruction by xray and bowels moved with miralax. CT AP 04-26-15 had 10.3 x 13.8 mixed cystic and solid mass involving uterus and extending posteriorly into cul de sac and into bilateral adnexal regions, sigmoid colon tethered to mass, no ascites, scattered necrotic lymph nodes in mesentery and omentum, no hydroureter, 3 lesions in liver up to 3.4 cm, and numerous bilateral pulmonary nodules in lung bases. CEA was 1.1  and CA 125 was 105; CBC and CMET 04-26-15 were entirely normal. 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 She had CT biopsy of pelvic mass in region of left psoas on 3-1-1 7. Pathology [phone], showed high grade poorly differentiated carcinoma with immunohistochemical stains suggesting cervical primary, which did not fit clinical picture well. CT chest 05-10-15 showed multiple bilateral pulmonary nodules (>40), 3 liver lesions up to 3.7 cm, left supraclavicular node and left hilar nodes. Chemotherapy was begun with first carboplatin and taxol on 05-12-15. Admitted with colonic obstruction 05-15-15, diverting sigmoid loop colostomy 05-18-15. Neutropenic 05-19-15 (day 8 cycle 1) despite granix begun day 5. Chemo changed to CDDP taxol with review of the pathology information, cycle 1 delayed due to inadequate IV access on 06-09-15. She had first CDDP taxol on 06-23-15 with on pro neulasta, no documented neutropenia and only mild thrombocytopenia (120k). Avastin was added to CDDP taxol with treatment 07-14-15.    Objective:  Vital signs in last 24 hours:  BP 105/59 mmHg  Pulse 68  Temp(Src) 98.2 F (36.8 C) (Oral)  Resp 18  Ht 5' 7"  (1.702 m)  Wt 161 lb 11.2 oz (73.347 kg)  BMI 25.32 kg/m2  SpO2 100% Weight up 1 lb Alert, oriented and appropriate. Ambulatory without difficulty, easily mobile in exam room. Respirations not labored RA. Looks comfortable, very pleasant.  Complete alopecia  HEENT:PERRL, sclerae not icteric. Oral mucosa moist without lesions, posterior pharynx clear.  Neck supple. No JVD.  Lymphatics:no cervical,supraclavicular,  axillary or inguinal adenopathy Resp: clear to auscultation bilaterally and normal percussion bilaterally Cardio: regular rate and rhythm. No gallop. GI: soft, nontender, not distended, no mass or organomegaly. Normally active bowel sounds. Soft stool in ostomy bag Musculoskeletal/ Extremities: without  pitting edema, cords, tenderness Neuro: no peripheral neuropathy. Otherwise nonfocal . PSYCH appropriate mood and affect Skin without rash, ecchymosis, petechiae Portacath-without erythema or tenderness  Lab Results:  Results for orders placed or performed in visit on 09/01/15  CBC with Differential  Result Value Ref Range   WBC 6.8 3.9 - 10.3 10e3/uL   NEUT# 4.6 1.5 - 6.5 10e3/uL   HGB 11.4 (L) 11.6 - 15.9 g/dL   HCT 33.9 (L) 34.8 - 46.6 %   Platelets 126 (L) 145 - 400 10e3/uL   MCV 90.9 79.5 - 101.0 fL   MCH 30.6 25.1 - 34.0 pg   MCHC 33.6 31.5 - 36.0 g/dL   RBC 3.73 3.70 - 5.45 10e6/uL   RDW 14.8 (H) 11.2 - 14.5 %   lymph# 1.6 0.9 - 3.3 10e3/uL   MONO# 0.4 0.1 - 0.9 10e3/uL   Eosinophils Absolute 0.2 0.0 - 0.5 10e3/uL   Basophils Absolute 0.0 0.0 - 0.1 10e3/uL   NEUT% 67.7 38.4 - 76.8 %   LYMPH% 23.5 14.0 - 49.7 %   MONO% 6.5 0.0 - 14.0 %   EOS% 2.2 0.0 - 7.0 %   BASO% 0.1 0.0 - 2.0 %  Comprehensive metabolic panel  Result Value Ref Range   Sodium 142 136 - 145 mEq/L   Potassium 4.2 3.5 - 5.1 mEq/L   Chloride 107 98 - 109 mEq/L   CO2 26 22 - 29 mEq/L   Glucose 110 70 - 140 mg/dl   BUN 11.4 7.0 - 26.0 mg/dL   Creatinine 0.9 0.6 - 1.1 mg/dL   Total Bilirubin <0.30 0.20 - 1.20 mg/dL   Alkaline Phosphatase 130 40 - 150 U/L   AST 15 5 - 34 U/L   ALT 15 0 - 55 U/L   Total Protein 6.9 6.4 - 8.3 g/dL   Albumin 3.5 3.5 - 5.0 g/dL   Calcium 8.9 8.4 - 10.4 mg/dL   Anion Gap 9 3 - 11 mEq/L   EGFR 67 (L) >90 ml/min/1.73 m2  Magnesium  Result Value Ref Range   Magnesium 1.5 1.5 - 2.5 mg/dl     Studies/Results:  No results found.  Medications: I have reviewed the patient's current medications.  DISCUSSION  Clinically doing well despite widely metastatic disease, and she understands that some side effects really cannot be completely avoided given the chemo + avastin regimen, so that we are pleased that she is again feeling better overall out from treatment. Hopefully  restaging scans will show some improvement, or at least stable disease; I expect that she will be willing to continue treatment if helpful, tho she has not committed to this.  She appreciates appointments coordinated around her preferred activities.     Assessment/Plan: 1. 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 Treatment is in attempt to shrink and control disease as best possible, not in curative attempt. Regimen now is CDDP taxol avastin, optimally to be used q 3 weeks,  Aloxi and emend with other antiemetics,  IVF (+ IV antiemetics) on day 5 given problems with prior cycles. Neulasta by on pro has supported counts well tho adding to aches. Repeat imaging with CT  CAP early July after this treatment and I will see her back after scans.  2.post diverting sigmoid loop colostomy 05-18-15 for distal bowel obstruction from pelvic malignancy. Ostomy functioning well. (Start of avastin held to allow wound healing, first avastin 07-14-15). 3.PAC in  4.chemo neutropenia with cycle 1 carbo taxol: on pro neulasta  5.nutritional status: po intake better since colostomy, tho difficult for ~ a week after chemo. 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 RN to speak with her by phone about this medication as we did not address during visit 7.sleep problems:Restoril 15 mg effective now 8. No advance directives: continue to address. She does seem to have better understanding of extent of disease and goal of treatment; we are adjusting schedule at this office to allow her beach trips etc. Note patient cared for son with chronic medical problems (I believe cerebral palsy) until his death 54 years ago; daughter also deceased.    All questions answered and she knows to call if concerns prior to next  scheduled visit.  TIme spent 25 min including >50% counseling and coordination of care. Route PCP.     LIVESAY,LENNIS P, MD   09/01/2015, 1:46 PM

## 2015-09-04 ENCOUNTER — Encounter: Payer: Self-pay | Admitting: Family Medicine

## 2015-09-05 ENCOUNTER — Other Ambulatory Visit: Payer: Self-pay

## 2015-09-05 NOTE — Patient Outreach (Addendum)
[facility]) Care Management  09/05/2015  ELLYSE FAILLE 16-Jan-1946 [id number]   Telephone Assessment   Outreach attempt #1 to patient. No answer at present. Noted in records patient planned beach trip through 7/10.    Plan; RN CM will make outreach attempt to patient within three weeks.   Enzo Montgomery, RN,BSN,CCM [facility] Management Telephonic Care Management Coordinator Direct Phone: [phone] Toll Free: [phone] Fax: [phone]

## 2015-09-13 ENCOUNTER — Ambulatory Visit (HOSPITAL_COMMUNITY)
Admission: RE | Admit: 2015-09-13 | Discharge: 2015-09-13 | Disposition: A | Discharge status: Home / Self Care | Payer: Commercial Managed Care - HMO | Source: Ambulatory Visit | Attending: Oncology | Admitting: Oncology

## 2015-09-13 ENCOUNTER — Encounter (HOSPITAL_COMMUNITY): Payer: Self-pay

## 2015-09-13 DIAGNOSIS — C7800 Secondary malignant neoplasm of unspecified lung: Secondary | ICD-10-CM | POA: Diagnosis not present

## 2015-09-13 DIAGNOSIS — C787 Secondary malignant neoplasm of liver and intrahepatic bile duct: Secondary | ICD-10-CM | POA: Insufficient documentation

## 2015-09-13 DIAGNOSIS — R590 Localized enlarged lymph nodes: Secondary | ICD-10-CM | POA: Insufficient documentation

## 2015-09-13 DIAGNOSIS — Z933 Colostomy status: Secondary | ICD-10-CM | POA: Insufficient documentation

## 2015-09-13 DIAGNOSIS — R1909 Other intra-abdominal and pelvic swelling, mass and lump: Secondary | ICD-10-CM | POA: Insufficient documentation

## 2015-09-13 DIAGNOSIS — I700 Atherosclerosis of aorta: Secondary | ICD-10-CM | POA: Diagnosis not present

## 2015-09-13 DIAGNOSIS — M4854XA Collapsed vertebra, not elsewhere classified, thoracic region, initial encounter for fracture: Secondary | ICD-10-CM | POA: Diagnosis not present

## 2015-09-13 DIAGNOSIS — C539 Malignant neoplasm of cervix uteri, unspecified: Secondary | ICD-10-CM | POA: Insufficient documentation

## 2015-09-13 MED ORDER — IOPAMIDOL (ISOVUE-300) INJECTION 61%
100.0000 mL | Freq: Once | INTRAVENOUS | Status: AC | PRN
Start: 2015-09-13 — End: 2015-09-13
  Administered 2015-09-13: 100 mL via INTRAVENOUS

## 2015-09-14 ENCOUNTER — Other Ambulatory Visit: Payer: Self-pay | Admitting: Oncology

## 2015-09-15 ENCOUNTER — Other Ambulatory Visit: Payer: Self-pay

## 2015-09-15 ENCOUNTER — Other Ambulatory Visit (HOSPITAL_BASED_OUTPATIENT_CLINIC_OR_DEPARTMENT_OTHER): Payer: Commercial Managed Care - HMO

## 2015-09-15 ENCOUNTER — Ambulatory Visit (HOSPITAL_BASED_OUTPATIENT_CLINIC_OR_DEPARTMENT_OTHER): Payer: Commercial Managed Care - HMO | Admitting: Oncology

## 2015-09-15 ENCOUNTER — Telehealth: Payer: Self-pay | Admitting: Oncology

## 2015-09-15 ENCOUNTER — Encounter: Payer: Self-pay | Admitting: Oncology

## 2015-09-15 VITALS — BP 116/63 | HR 69 | Temp 97.9°F | Resp 18 | Ht 67.0 in | Wt 162.5 lb

## 2015-09-15 DIAGNOSIS — C539 Malignant neoplasm of cervix uteri, unspecified: Secondary | ICD-10-CM

## 2015-09-15 DIAGNOSIS — C801 Malignant (primary) neoplasm, unspecified: Secondary | ICD-10-CM

## 2015-09-15 DIAGNOSIS — C787 Secondary malignant neoplasm of liver and intrahepatic bile duct: Secondary | ICD-10-CM

## 2015-09-15 DIAGNOSIS — C7801 Secondary malignant neoplasm of right lung: Secondary | ICD-10-CM

## 2015-09-15 DIAGNOSIS — C7802 Secondary malignant neoplasm of left lung: Secondary | ICD-10-CM

## 2015-09-15 DIAGNOSIS — C701 Malignant neoplasm of spinal meninges: Secondary | ICD-10-CM | POA: Diagnosis not present

## 2015-09-15 DIAGNOSIS — T451X5A Adverse effect of antineoplastic and immunosuppressive drugs, initial encounter: Secondary | ICD-10-CM

## 2015-09-15 DIAGNOSIS — D701 Agranulocytosis secondary to cancer chemotherapy: Secondary | ICD-10-CM

## 2015-09-15 DIAGNOSIS — C799 Secondary malignant neoplasm of unspecified site: Secondary | ICD-10-CM

## 2015-09-15 DIAGNOSIS — Z5112 Encounter for antineoplastic immunotherapy: Secondary | ICD-10-CM

## 2015-09-15 DIAGNOSIS — C7989 Secondary malignant neoplasm of other specified sites: Secondary | ICD-10-CM

## 2015-09-15 LAB — CBC WITH DIFFERENTIAL/PLATELET
BASO%: 0.9 % (ref 0.0–2.0)
Basophils Absolute: 0.1 10*3/uL (ref 0.0–0.1)
EOS%: 1.2 % (ref 0.0–7.0)
Eosinophils Absolute: 0.1 10*3/uL (ref 0.0–0.5)
HCT: 36.7 % (ref 34.8–46.6)
HGB: 12.4 g/dL (ref 11.6–15.9)
LYMPH%: 28.9 % (ref 14.0–49.7)
MCH: 30.5 pg (ref 25.1–34.0)
MCHC: 33.6 g/dL (ref 31.5–36.0)
MCV: 90.7 fL (ref 79.5–101.0)
MONO#: 0.6 10*3/uL (ref 0.1–0.9)
MONO%: 9.1 % (ref 0.0–14.0)
NEUT#: 3.7 10*3/uL (ref 1.5–6.5)
NEUT%: 59.9 % (ref 38.4–76.8)
Platelets: 197 10*3/uL (ref 145–400)
RBC: 4.05 10*6/uL (ref 3.70–5.45)
RDW: 15.6 % — ABNORMAL HIGH (ref 11.2–14.5)
WBC: 6.1 10*3/uL (ref 3.9–10.3)
lymph#: 1.8 10*3/uL (ref 0.9–3.3)

## 2015-09-15 LAB — COMPREHENSIVE METABOLIC PANEL
ALT: 13 U/L (ref 0–55)
ANION GAP: 8 meq/L (ref 3–11)
AST: 18 U/L (ref 5–34)
Albumin: 3.8 g/dL (ref 3.5–5.0)
Alkaline Phosphatase: 116 U/L (ref 40–150)
BUN: 17.5 mg/dL (ref 7.0–26.0)
CALCIUM: 9.5 mg/dL (ref 8.4–10.4)
CHLORIDE: 105 meq/L (ref 98–109)
CO2: 25 mEq/L (ref 22–29)
Creatinine: 0.9 mg/dL (ref 0.6–1.1)
EGFR: 67 mL/min/{1.73_m2} — AB (ref >90)
Glucose: 85 mg/dl (ref 70–140)
POTASSIUM: 4.6 meq/L (ref 3.5–5.1)
Sodium: 138 mEq/L (ref 136–145)
Total Bilirubin: 0.45 mg/dL (ref 0.20–1.20)
Total Protein: 7.5 g/dL (ref 6.4–8.3)

## 2015-09-15 LAB — MAGNESIUM: MAGNESIUM: 1.7 mg/dL (ref 1.5–2.5)

## 2015-09-15 LAB — UA PROTEIN, DIPSTICK - CHCC: PROTEIN: NEGATIVE mg/dL

## 2015-09-15 MED ORDER — DEXAMETHASONE 4 MG PO TABS
ORAL_TABLET | ORAL | Status: DC
Start: 2015-09-15 — End: 2016-02-24

## 2015-09-15 NOTE — Telephone Encounter (Signed)
lvm to inform pt of scheduled appts date/time per 7/13 pof. OK per LL to schedule infusion 7/21 due to being capped

## 2015-09-15 NOTE — Progress Notes (Signed)
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 She had 1 initial cycle of carbo taxol 05-12-15, then 3 cycles of CDDP taxol avastin thru 08-18-15, with aloxi, emend and on pro neulasta.  Patient experiences fatigue and weakness for ~ a week after each chemo, with IVF helpful as she has difficulty getting in enough po fluids that week (particularly with fluid losses from colosotmy).  Patient has felt well last 2+ weeks, enjoying another trip to the beach. Appetite is good without nausea or vomiting, diverting colostomy working well, no peripheral neuropathy, no bleeding, no SOB or significant cough, no abdominal or pelvic pain, no problems with PAC, no LE swelling. She has tendency to urinary incontinence unchanged, no symptoms of UTI, prefers to manage this with pads than any intervention. She felt a little weak after 3+ hour car ride home from beach this week, better with gatorade then. She has not had taxol/ neulasta aches since the week after chemo, tho some aching in feet and legs when she climbs on ladder, doing work at home. Remainder of 10 point Review of Systems negative.   PAC in  [place] Patient was in her usual very good health until new cramping abdominal pain intermittently for last few months. She had some constipation with this, particularly severe when she saw PCP for this reason on 04-26-15. She has had no vaginal bleeding. She had no bowel obstruction by xray and bowels moved with miralax. CT AP 04-26-15 had 10.3 x 13.8 mixed cystic and solid mass involving uterus and extending posteriorly into cul de sac and into bilateral adnexal regions, sigmoid colon  tethered to mass, no ascites, scattered necrotic lymph nodes in mesentery and omentum, no hydroureter, 3 lesions in liver up to 3.4 cm, and numerous bilateral pulmonary nodules in lung bases. CEA was 1.1 and CA 125 was 105; CBC and CMET 04-26-15 were entirely normal. 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 She had CT biopsy of pelvic mass in region of left psoas on 3-1-1 7. Pathology [phone], showed high grade poorly differentiated carcinoma with immunohistochemical stains suggesting cervical primary, which did not fit clinical picture well. CT chest 05-10-15 showed multiple bilateral pulmonary nodules (>40), 3 liver lesions up to 3.7 cm, left supraclavicular node and left hilar nodes. Chemotherapy was begun with first carboplatin and taxol on 05-12-15. Admitted with colonic obstruction 05-15-15, diverting sigmoid loop colostomy 05-18-15. Neutropenic 05-19-15 (day 8 cycle 1) despite granix begun day 5. Chemo changed to CDDP taxol with review of the pathology information, cycle 1 delayed due to inadequate IV access on 06-09-15. She had first CDDP taxol on 06-23-15 with on pro neulasta, no documented neutropenia and only mild thrombocytopenia (120k). Avastin was added to CDDP taxol with treatment 07-14-15. CT CAP 09-13-15, after 3 cycles CDDP taxol avastin, had significant decrease in pulmonary, liver, nodal and pelvic involvement with no new areas of disease.    Objective:  Vital signs in last 24 hours:  BP 116/63 mmHg  Pulse 69  Temp(Src) 97.9 F (36.6 C) (Oral)  Resp 18  Ht 5' 7"  (1.702 m)  Wt 162 lb 8 oz (73.71 kg)  BMI 25.45  kg/m2  SpO2 100% Weight up 1 lb  Alert, oriented and appropriate. Ambulatory without difficulty.  Alopecia  HEENT:PERRL, sclerae not icteric. Oral mucosa moist without lesions, posterior pharynx clear.  Neck supple. No JVD.  Lymphatics:no supraclavicular or inguinal adenopathy Resp: clear to auscultation bilaterally  and normal percussion bilaterally Cardio: regular rate and rhythm. No gallop. GI: soft, nontender, not distended, no mass or organomegaly. Normally active bowel sounds. Ostomy not remarkable. Musculoskeletal/ Extremities: without pitting edema, cords, tenderness Neuro: no peripheral neuropathy. Otherwise nonfocal. PSYCH appropriate mood and affect Skin without rash, ecchymosis, petechiae Portacath-without erythema or tenderness  Lab Results:  Results for orders placed or performed in visit on 09/15/15  CBC with Differential  Result Value Ref Range   WBC 6.1 3.9 - 10.3 10e3/uL   NEUT# 3.7 1.5 - 6.5 10e3/uL   HGB 12.4 11.6 - 15.9 g/dL   HCT 36.7 34.8 - 46.6 %   Platelets 197 145 - 400 10e3/uL   MCV 90.7 79.5 - 101.0 fL   MCH 30.5 25.1 - 34.0 pg   MCHC 33.6 31.5 - 36.0 g/dL   RBC 4.05 3.70 - 5.45 10e6/uL   RDW 15.6 (H) 11.2 - 14.5 %   lymph# 1.8 0.9 - 3.3 10e3/uL   MONO# 0.6 0.1 - 0.9 10e3/uL   Eosinophils Absolute 0.1 0.0 - 0.5 10e3/uL   Basophils Absolute 0.1 0.0 - 0.1 10e3/uL   NEUT% 59.9 38.4 - 76.8 %   LYMPH% 28.9 14.0 - 49.7 %   MONO% 9.1 0.0 - 14.0 %   EOS% 1.2 0.0 - 7.0 %   BASO% 0.9 0.0 - 2.0 %  Comprehensive metabolic panel  Result Value Ref Range   Sodium 138 136 - 145 mEq/L   Potassium 4.6 3.5 - 5.1 mEq/L   Chloride 105 98 - 109 mEq/L   CO2 25 22 - 29 mEq/L   Glucose 85 70 - 140 mg/dl   BUN 17.5 7.0 - 26.0 mg/dL   Creatinine 0.9 0.6 - 1.1 mg/dL   Total Bilirubin 0.45 0.20 - 1.20 mg/dL   Alkaline Phosphatase 116 40 - 150 U/L   AST 18 5 - 34 U/L   ALT 13 0 - 55 U/L   Total Protein 7.5 6.4 - 8.3 g/dL   Albumin 3.8 3.5 - 5.0 g/dL   Calcium 9.5 8.4 - 10.4 mg/dL   Anion Gap 8 3 - 11 mEq/L   EGFR 67 (L) >90 ml/min/1.73 m2  Magnesium  Result Value Ref Range   Magnesium 1.7 1.5 - 2.5 mg/dl  Urine protein by dipstick  Result Value Ref Range   Protein, ur Negative Negative- <30 mg/dL     Studies/Results: EXAM: CT CHEST, ABDOMEN, AND PELVIS WITH CONTRAST   09-13-15  TECHNIQUE: Multidetector CT imaging of the chest, abdomen and pelvis was performed following the standard protocol during bolus administration of intravenous contrast.  CONTRAST: 111m ISOVUE-300 IOPAMIDOL (ISOVUE-300) INJECTION 61%  COMPARISON: 05/10/2015 CT chest. 05/15/2015 CT abdomen/pelvis.  FINDINGS: CT CHEST  Mediastinum/Nodes: Normal heart size. No significant pericardial fluid/thickening. Right internal jugular MediPort terminates at the cavoatrial junction. Mildly atherosclerotic nonaneurysmal thoracic aorta. Normal caliber pulmonary arteries. No central pulmonary emboli. Stable hypodense 1.4 cm right thyroid lobe nodule. Unremarkable esophagus. No axillary adenopathy. Left supraclavicular 0.6 cm lymph node (series 2/image 80), decreased from 1.0 cm. Left hilar 0.5 cm node (series 2/image 20) is decreased from 1.1 cm. No pathologically enlarged mediastinal or hilar nodes.  Lungs/Pleura: No pneumothorax. No pleural effusion. Pulmonary nodules (greater  than 20) have all decreased in size, with representative nodules as follows:  - right upper lobe 4 mm nodule (series 4/image 40), decreased from 9 mm  - right lower lobe 5 mm nodule (series 4/ image 112), decreased from 11 mm  - left lower lobe 7 mm nodule (series 4/ image 107), decreased from 11 mm  No acute consolidative airspace disease or appreciable new significant pulmonary nodules.  Musculoskeletal: No aggressive appearing focal osseous lesions. Moderate thoracic spondylosis. Stable mild chronic superior T12 compression fracture.  CT ABDOMEN AND PELVIS  Hepatobiliary: Hypodense liver metastases has significantly decreased in size as follows:  - segment 4A left liver lobe 1.2 x 0.9 cm mass (series 2/ image 52), decreased from 3.8 x 2.7 cm  - segment 5 right liver lobe 0.5 x 0.5 cm mass (series 2/ image 62), decreased from 1.1 x 1.0 cm  No new liver masses. Hypodense 0.6 cm  posterior medial right liver lobe lesion (series 2/ image 60) is too small to characterize and is unchanged back to 04/26/2015, favor a benign lesion. Normal gallbladder with no radiopaque cholelithiasis. No biliary ductal dilatation.  Pancreas: Normal, with no mass or duct dilation.  Spleen: Normal size. No mass.  Adrenals/Urinary Tract: Normal adrenals. Simple 1.2 cm renal cyst in the medial lower left kidney. Otherwise unremarkable kidneys with no hydronephrosis. Normal bladder.  Stomach/Bowel: Grossly normal stomach. Normal caliber small bowel with no small bowel wall thickening. Appendectomy. Status post diverting sigmoid colostomy in the left lower quadrant . No large bowel wall thickening or pericolonic fat stranding.  Vascular/Lymphatic: Normal caliber abdominal aorta. Patent portal, splenic and renal veins. No pathologically enlarged lymph nodes in the abdomen or pelvis.  Reproductive: Mixed attenuation mass encompassing the uterus and bilateral adnexa regions has significantly decreased in size, measuring 11.0 x 6.8 cm (series 2/ image 109) in maximum axial dimensions, previously 14.3 x 812.0 cm using similar measurement technique. There is decreased encasement of the distal sigmoid colon by the mass.  Other: No pneumoperitoneum. No ascites. No focal fluid collections. The numerous previously described omental nodules, mesenteric nodules and bilateral lower quadrant/pelvic peritoneal nodules are no longer visualized. No new peritoneal implants.  Musculoskeletal: No aggressive appearing focal osseous lesions. Mild lumbar spondylosis.  IMPRESSION: 1. Significant interval treatment response. Pulmonary and liver metastases have significantly decreased in size. Thoracic adenopathy has decreased in size. Mixed attenuation mass encompassing the uterus and bilateral adnexal regions has significantly decreased in size. No residual visualized peritoneal tumor  implants. No new or progressive metastatic disease. 2. No evidence of bowel obstruction or acute bowel inflammation status post diverting sigmoid colostomy. 3. Additional findings include mild aortic atherosclerosis and Stable mild chronic superior T12 compression fracture.    PACs images reviewed with patient at time of visit  Medications: I have reviewed the patient's current medications.  DISCUSSION Interval history and information from restaging scans reviewed. I have explained that it would be very reasonable from treatment standpoint to continue present regimen, trying to get as much benefit as possible.Tho she very much dislikes feeling badly for a week after treatment, given obvious improvement she is in agreement with continuing now. She prefers treatments on Thursdays and agrees to IVF on ~ day 5. She tells me that she plans to try to push oral hydration moreso, can call to cancel IVF if she is really doing well with po fluids and not symptomatic. She understands that fluid losses from ostomy are exacerbating this problem.    Assessment/Plan: 1.  high grade poorly differentiated gyn carcinoma with pathology immunostains consistent with cervical (or endocervical) primary, metastatic in pelvis and to lungs and liver. Good partial response by CT CAP 09-13-15 , after 3 cycles CDDP taxol avastin in palliative attempt, optimally to be used q 3 weeks, Aloxi and emend with other antiemetics, IVF ~ day 5,  Neulasta by on pro. Continue present treatment.  2.post diverting sigmoid loop colostomy 05-18-15 for distal bowel obstruction from pelvic malignancy. Ostomy functioning well. (Start of avastin held to allow wound healing, first avastin 07-14-15). 3.PAC in  4.chemo neutropenia with cycle 1 carbo taxol: on pro neulasta  5.nutritional status: po intake better since colostomy, tho difficult for ~ a week after chemo. 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 No advance directives: continue to address. She does seem to have better understanding of extent of disease and goal of treatment; we are adjusting schedule at this office to allow her beach trips etc. 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    All questions answered. Chemo, IVF, neulasta orders placed and confirmed. I will see her ~ 2 weeks after next treatment. Route PCP, update Dr Josephina Shih TIme spent 25 min including >50% counseling and coordination of care.     Evlyn Clines, MD   09/17/2015, 12:55 PM

## 2015-09-20 ENCOUNTER — Other Ambulatory Visit: Payer: Self-pay

## 2015-09-20 NOTE — Patient Outreach (Signed)
[facility]) Care Management  09/20/2015  Katherine Boone 1945/11/07 [id number]   Telephone Assessment   Outreach attempt # 2 to patient. No answer at present and unable to leave message.    Plan: RN CM will make outreach attempt to patient within a week.   Enzo Montgomery, RN,BSN,CCM [place] Management Telephonic Care Management Coordinator Direct Phone: [phone] Toll Free: [phone] Fax: [phone]

## 2015-09-23 ENCOUNTER — Other Ambulatory Visit (HOSPITAL_BASED_OUTPATIENT_CLINIC_OR_DEPARTMENT_OTHER): Payer: Commercial Managed Care - HMO

## 2015-09-23 ENCOUNTER — Ambulatory Visit: Payer: Commercial Managed Care - HMO

## 2015-09-23 ENCOUNTER — Ambulatory Visit (HOSPITAL_BASED_OUTPATIENT_CLINIC_OR_DEPARTMENT_OTHER): Payer: Commercial Managed Care - HMO

## 2015-09-23 VITALS — BP 113/67 | HR 74 | Temp 98.0°F | Resp 18

## 2015-09-23 DIAGNOSIS — C801 Malignant (primary) neoplasm, unspecified: Secondary | ICD-10-CM | POA: Diagnosis not present

## 2015-09-23 DIAGNOSIS — Z5111 Encounter for antineoplastic chemotherapy: Secondary | ICD-10-CM

## 2015-09-23 DIAGNOSIS — C799 Secondary malignant neoplasm of unspecified site: Secondary | ICD-10-CM

## 2015-09-23 DIAGNOSIS — C787 Secondary malignant neoplasm of liver and intrahepatic bile duct: Secondary | ICD-10-CM | POA: Diagnosis not present

## 2015-09-23 DIAGNOSIS — C701 Malignant neoplasm of spinal meninges: Secondary | ICD-10-CM | POA: Diagnosis not present

## 2015-09-23 DIAGNOSIS — C7801 Secondary malignant neoplasm of right lung: Secondary | ICD-10-CM

## 2015-09-23 DIAGNOSIS — C539 Malignant neoplasm of cervix uteri, unspecified: Secondary | ICD-10-CM

## 2015-09-23 DIAGNOSIS — C7802 Secondary malignant neoplasm of left lung: Secondary | ICD-10-CM | POA: Diagnosis not present

## 2015-09-23 LAB — COMPREHENSIVE METABOLIC PANEL
ALK PHOS: 129 U/L (ref 40–150)
ALT: 15 U/L (ref 0–55)
ANION GAP: 11 meq/L (ref 3–11)
AST: 15 U/L (ref 5–34)
Albumin: 3.8 g/dL (ref 3.5–5.0)
BILIRUBIN TOTAL: 0.38 mg/dL (ref 0.20–1.20)
BUN: 13.1 mg/dL (ref 7.0–26.0)
CALCIUM: 9.7 mg/dL (ref 8.4–10.4)
CO2: 20 meq/L — AB (ref 22–29)
CREATININE: 1.1 mg/dL (ref 0.6–1.1)
Chloride: 108 mEq/L (ref 98–109)
EGFR: 53 mL/min/{1.73_m2} — ABNORMAL LOW (ref >90)
Glucose: 258 mg/dl — ABNORMAL HIGH (ref 70–140)
Potassium: 4.9 mEq/L (ref 3.5–5.1)
Sodium: 139 mEq/L (ref 136–145)
TOTAL PROTEIN: 7.8 g/dL (ref 6.4–8.3)

## 2015-09-23 LAB — CBC WITH DIFFERENTIAL/PLATELET
BASO%: 0 % (ref 0.0–2.0)
Basophils Absolute: 0 10*3/uL (ref 0.0–0.1)
EOS%: 0 % (ref 0.0–7.0)
Eosinophils Absolute: 0 10*3/uL (ref 0.0–0.5)
HEMATOCRIT: 38.3 % (ref 34.8–46.6)
HGB: 13.1 g/dL (ref 11.6–15.9)
LYMPH#: 0.5 10*3/uL — AB (ref 0.9–3.3)
LYMPH%: 5.9 % — AB (ref 14.0–49.7)
MCH: 31 pg (ref 25.1–34.0)
MCHC: 34.2 g/dL (ref 31.5–36.0)
MCV: 90.5 fL (ref 79.5–101.0)
MONO#: 0 10*3/uL — AB (ref 0.1–0.9)
MONO%: 0.1 % (ref 0.0–14.0)
NEUT%: 94 % — AB (ref 38.4–76.8)
NEUTROS ABS: 7.9 10*3/uL — AB (ref 1.5–6.5)
PLATELETS: 175 10*3/uL (ref 145–400)
RBC: 4.23 10*6/uL (ref 3.70–5.45)
RDW: 14.1 % (ref 11.2–14.5)
WBC: 8.4 10*3/uL (ref 3.9–10.3)

## 2015-09-23 LAB — MAGNESIUM: MAGNESIUM: 1.8 mg/dL (ref 1.5–2.5)

## 2015-09-23 MED ORDER — SODIUM CHLORIDE 0.9 % IV SOLN
Freq: Once | INTRAVENOUS | Status: AC
  Administered 2015-09-23: 10:00:00 via INTRAVENOUS

## 2015-09-23 MED ORDER — SODIUM CHLORIDE 0.9 % IV SOLN
14.8000 mg/kg | Freq: Once | INTRAVENOUS | Status: AC
  Administered 2015-09-23: 1100 mg via INTRAVENOUS
  Filled 2015-09-23: qty 44

## 2015-09-23 MED ORDER — CISPLATIN CHEMO INJECTION 100MG/100ML
50.0000 mg/m2 | Freq: Once | INTRAVENOUS | Status: AC
  Administered 2015-09-23: 94 mg via INTRAVENOUS
  Filled 2015-09-23: qty 94

## 2015-09-23 MED ORDER — DIPHENHYDRAMINE HCL 50 MG/ML IJ SOLN
50.0000 mg | Freq: Once | INTRAMUSCULAR | Status: AC
  Administered 2015-09-23: 50 mg via INTRAVENOUS

## 2015-09-23 MED ORDER — ACETAMINOPHEN 325 MG PO TABS
650.0000 mg | ORAL_TABLET | Freq: Once | ORAL | Status: AC
  Administered 2015-09-23: 650 mg via ORAL

## 2015-09-23 MED ORDER — HEPARIN SOD (PORK) LOCK FLUSH 100 UNIT/ML IV SOLN
500.0000 [IU] | Freq: Once | INTRAVENOUS | Status: AC | PRN
  Administered 2015-09-23: 500 [IU]
  Filled 2015-09-23: qty 5

## 2015-09-23 MED ORDER — POTASSIUM CHLORIDE 2 MEQ/ML IV SOLN
Freq: Once | INTRAVENOUS | Status: AC
  Administered 2015-09-23: 10:00:00 via INTRAVENOUS
  Filled 2015-09-23: qty 10

## 2015-09-23 MED ORDER — SODIUM CHLORIDE 0.9% FLUSH
10.0000 mL | INTRAVENOUS | Status: DC | PRN
  Administered 2015-09-23: 10 mL
  Filled 2015-09-23: qty 10

## 2015-09-23 MED ORDER — PALONOSETRON HCL INJECTION 0.25 MG/5ML
0.2500 mg | Freq: Once | INTRAVENOUS | Status: AC
  Administered 2015-09-23: 0.25 mg via INTRAVENOUS

## 2015-09-23 MED ORDER — DIPHENHYDRAMINE HCL 50 MG/ML IJ SOLN
INTRAMUSCULAR | Status: AC
  Filled 2015-09-23: qty 1

## 2015-09-23 MED ORDER — PALONOSETRON HCL INJECTION 0.25 MG/5ML
INTRAVENOUS | Status: AC
  Filled 2015-09-23: qty 5

## 2015-09-23 MED ORDER — SODIUM CHLORIDE 0.9 % IV SOLN
Freq: Once | INTRAVENOUS | Status: AC
  Administered 2015-09-23: 12:00:00 via INTRAVENOUS
  Filled 2015-09-23: qty 5

## 2015-09-23 MED ORDER — SODIUM CHLORIDE 0.9 % IV SOLN
135.0000 mg/m2 | Freq: Once | INTRAVENOUS | Status: AC
  Administered 2015-09-23: 252 mg via INTRAVENOUS
  Filled 2015-09-23: qty 42

## 2015-09-23 MED ORDER — ACETAMINOPHEN 325 MG PO TABS
ORAL_TABLET | ORAL | Status: AC
  Filled 2015-09-23: qty 2

## 2015-09-23 MED ORDER — PEGFILGRASTIM 6 MG/0.6ML ~~LOC~~ PSKT
6.0000 mg | PREFILLED_SYRINGE | Freq: Once | SUBCUTANEOUS | Status: AC
  Administered 2015-09-23: 6 mg via SUBCUTANEOUS
  Filled 2015-09-23: qty 0.6

## 2015-09-23 MED ORDER — FAMOTIDINE IN NACL 20-0.9 MG/50ML-% IV SOLN
20.0000 mg | Freq: Once | INTRAVENOUS | Status: AC
  Administered 2015-09-23: 20 mg via INTRAVENOUS

## 2015-09-23 MED ORDER — FAMOTIDINE IN NACL 20-0.9 MG/50ML-% IV SOLN
INTRAVENOUS | Status: AC
  Filled 2015-09-23: qty 50

## 2015-09-23 NOTE — Progress Notes (Signed)
Per Tammi RN per Dr. Alvy Bimler okay to run post hydration fluids over one hour along with Cisplatin. Pt aware and states she has no known heart condition and verbalizes understanding and agrees with plan.  1655: Patient educated to let a nurse know if she feels as if she starts to feel short of breathe or any other Symptoms, pt also educated to force fluids especially water especially over the next 48-72 hours.  Pt verblizes understanding, report given to Hilario Quarry RN.

## 2015-09-23 NOTE — Patient Instructions (Signed)
[facility] Discharge Instructions for Patients Receiving Chemotherapy  Today you received the following chemotherapy agents: Taxol, Cisplatin, Avastin   To help prevent nausea and vomiting after your treatment, we encourage you to take your nausea medication as directed.    If you develop nausea and vomiting that is not controlled by your nausea medication, call the clinic.   BELOW ARE SYMPTOMS THAT SHOULD BE REPORTED IMMEDIATELY:  *FEVER GREATER THAN 100.5 F  *CHILLS WITH OR WITHOUT FEVER  NAUSEA AND VOMITING THAT IS NOT CONTROLLED WITH YOUR NAUSEA MEDICATION  *UNUSUAL SHORTNESS OF BREATH  *UNUSUAL BRUISING OR BLEEDING  TENDERNESS IN MOUTH AND THROAT WITH OR WITHOUT PRESENCE OF ULCERS  *URINARY PROBLEMS  *BOWEL PROBLEMS  UNUSUAL RASH Items with * indicate a potential emergency and should be followed up as soon as possible.  Feel free to call the clinic you have any questions or concerns. The clinic phone number is [phone].  Please show the [place] at check-in to the Emergency Department and triage nurse.

## 2015-09-26 ENCOUNTER — Other Ambulatory Visit: Payer: Self-pay

## 2015-09-26 NOTE — Patient Outreach (Signed)
[facility]) Care Management  09/26/2015  Katherine Boone January 28, 1946 [id number]   Telephone Assessment Attempt   Outreach attempt #3 to patient. No answer. Voicemail message left.     Plan: RN CM will send unsuccessful outreach letter to patient. RN CM will close case if no response from patient within 10 business days.   Enzo Montgomery, RN,BSN,CCM [place] Management Telephonic Care Management Coordinator Direct Phone: [phone] Toll Free: [phone] Fax: [phone]

## 2015-09-27 ENCOUNTER — Ambulatory Visit: Payer: Commercial Managed Care - HMO

## 2015-10-05 ENCOUNTER — Other Ambulatory Visit: Payer: Self-pay | Admitting: Oncology

## 2015-10-05 DIAGNOSIS — C799 Secondary malignant neoplasm of unspecified site: Secondary | ICD-10-CM

## 2015-10-06 ENCOUNTER — Telehealth: Payer: Self-pay | Admitting: Oncology

## 2015-10-06 ENCOUNTER — Other Ambulatory Visit (HOSPITAL_BASED_OUTPATIENT_CLINIC_OR_DEPARTMENT_OTHER): Payer: Commercial Managed Care - HMO

## 2015-10-06 ENCOUNTER — Ambulatory Visit (HOSPITAL_BASED_OUTPATIENT_CLINIC_OR_DEPARTMENT_OTHER): Payer: Commercial Managed Care - HMO | Admitting: Oncology

## 2015-10-06 ENCOUNTER — Encounter: Payer: Self-pay | Admitting: Oncology

## 2015-10-06 VITALS — BP 108/57 | HR 66 | Temp 98.1°F | Resp 18 | Ht 67.0 in | Wt 163.0 lb

## 2015-10-06 DIAGNOSIS — C539 Malignant neoplasm of cervix uteri, unspecified: Secondary | ICD-10-CM

## 2015-10-06 DIAGNOSIS — C787 Secondary malignant neoplasm of liver and intrahepatic bile duct: Secondary | ICD-10-CM | POA: Diagnosis not present

## 2015-10-06 DIAGNOSIS — Z95828 Presence of other vascular implants and grafts: Secondary | ICD-10-CM

## 2015-10-06 DIAGNOSIS — B009 Herpesviral infection, unspecified: Secondary | ICD-10-CM

## 2015-10-06 DIAGNOSIS — T451X5A Adverse effect of antineoplastic and immunosuppressive drugs, initial encounter: Secondary | ICD-10-CM

## 2015-10-06 DIAGNOSIS — C801 Malignant (primary) neoplasm, unspecified: Secondary | ICD-10-CM

## 2015-10-06 DIAGNOSIS — C7989 Secondary malignant neoplasm of other specified sites: Secondary | ICD-10-CM | POA: Diagnosis not present

## 2015-10-06 DIAGNOSIS — C7802 Secondary malignant neoplasm of left lung: Secondary | ICD-10-CM

## 2015-10-06 DIAGNOSIS — C7801 Secondary malignant neoplasm of right lung: Secondary | ICD-10-CM

## 2015-10-06 DIAGNOSIS — D701 Agranulocytosis secondary to cancer chemotherapy: Secondary | ICD-10-CM

## 2015-10-06 DIAGNOSIS — C799 Secondary malignant neoplasm of unspecified site: Secondary | ICD-10-CM

## 2015-10-06 LAB — CBC WITH DIFFERENTIAL/PLATELET
BASO%: 0.2 % (ref 0.0–2.0)
BASOS ABS: 0 10*3/uL (ref 0.0–0.1)
EOS ABS: 0.1 10*3/uL (ref 0.0–0.5)
EOS%: 1.1 % (ref 0.0–7.0)
HCT: 34 % — ABNORMAL LOW (ref 34.8–46.6)
HEMOGLOBIN: 11.3 g/dL — AB (ref 11.6–15.9)
LYMPH%: 22.3 % (ref 14.0–49.7)
MCH: 30.6 pg (ref 25.1–34.0)
MCHC: 33.2 g/dL (ref 31.5–36.0)
MCV: 92.1 fL (ref 79.5–101.0)
MONO#: 0.7 10*3/uL (ref 0.1–0.9)
MONO%: 7.5 % (ref 0.0–14.0)
NEUT#: 6.3 10*3/uL (ref 1.5–6.5)
NEUT%: 68.9 % (ref 38.4–76.8)
Platelets: 133 10*3/uL — ABNORMAL LOW (ref 145–400)
RBC: 3.69 10*6/uL — AB (ref 3.70–5.45)
RDW: 14.1 % (ref 11.2–14.5)
WBC: 9.1 10*3/uL (ref 3.9–10.3)
lymph#: 2 10*3/uL (ref 0.9–3.3)

## 2015-10-06 LAB — UA PROTEIN, DIPSTICK - CHCC: Protein, ur: NEGATIVE mg/dL

## 2015-10-06 LAB — COMPREHENSIVE METABOLIC PANEL
ALBUMIN: 3.5 g/dL (ref 3.5–5.0)
ALK PHOS: 135 U/L (ref 40–150)
ALT: 16 U/L (ref 0–55)
AST: 15 U/L (ref 5–34)
Anion Gap: 8 mEq/L (ref 3–11)
BUN: 11.1 mg/dL (ref 7.0–26.0)
CHLORIDE: 109 meq/L (ref 98–109)
CO2: 25 meq/L (ref 22–29)
Calcium: 9.2 mg/dL (ref 8.4–10.4)
Creatinine: 0.9 mg/dL (ref 0.6–1.1)
EGFR: 66 mL/min/{1.73_m2} — AB (ref >90)
GLUCOSE: 97 mg/dL (ref 70–140)
POTASSIUM: 5.1 meq/L (ref 3.5–5.1)
SODIUM: 142 meq/L (ref 136–145)
Total Bilirubin: 0.31 mg/dL (ref 0.20–1.20)
Total Protein: 6.9 g/dL (ref 6.4–8.3)

## 2015-10-06 LAB — MAGNESIUM: Magnesium: 1.7 mg/dl (ref 1.5–2.5)

## 2015-10-06 NOTE — Telephone Encounter (Signed)
per pof to sch pt appt-gave pt copy of avs/cal °

## 2015-10-06 NOTE — Progress Notes (Signed)
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 She has been treated to date with 1 cycle of carbo taxol on 05-12-15, then 4 cycles of CDDP taxol avastin thru 09-23-15. CT CAP 09-13-15 had significant improvement in all areas. Treatment schedule has been adjusted to allow beach trips etc.   Patient has weakness and fatigue for 7-10 days after each chemotherapy treatment, after which her energy is very good and she feels well. She has no peripheral neuropathy and has had no nausea. She was able to push po fluids including sports drinks after most recent chemo, preferred this to IVF. She has had 3-4 episodes of sharp, brief LLQ pain, not associated with position or activity, otherwise no discomfort there. No bleeding. No SOB. She had constipation x 3 days after chemo, subsequently ostomy functioning well and I have suggested she try stool softener or MOM after next treatment.  Ostomy stoma is less prominent but appliance still working well. No fever or symptoms of infection. No bleeding. No LE swelling. No other abdominal or pelvic pain. Remainder of 10 point Review of Systems negative  PAC in   ONCOLOGIC HISTORY Patient was in her usual very good health until new cramping abdominal pain intermittently for last few months. She had some constipation with this, particularly severe when she saw PCP for this reason on 04-26-15. She has had no vaginal bleeding. She had no bowel obstruction by xray and bowels moved with miralax. CT AP 04-26-15 had 10.3 x 13.8 mixed cystic and solid mass involving uterus and extending posteriorly into cul de sac and into bilateral adnexal regions, sigmoid colon tethered to mass, no ascites, scattered necrotic lymph nodes in mesentery and omentum,  no hydroureter, 3 lesions in liver up to 3.4 cm, and numerous bilateral pulmonary nodules in lung bases. CEA was 1.1 and CA 125 was 105; CBC and CMET 04-26-15 were entirely normal. 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 She had CT biopsy of pelvic mass in region of left psoas on 3-1-1 7. Pathology [phone], showed high grade poorly differentiated carcinoma with immunohistochemical stains suggesting cervical primary, which did not fit clinical picture well. CT chest 05-10-15 showed multiple bilateral pulmonary nodules (>40), 3 liver lesions up to 3.7 cm, left supraclavicular node and left hilar nodes. Chemotherapy was begun with first carboplatin and taxol on 05-12-15. Admitted with colonic obstruction 05-15-15, diverting sigmoid loop colostomy 05-18-15. Neutropenic 05-19-15 (day 8 cycle 1) despite granix begun day 5. Chemo changed to CDDP taxol with review of the pathology information, cycle 1 delayed due to inadequate IV access on 06-09-15. She had first CDDP taxol on 06-23-15 with on pro neulasta, no documented neutropenia and only mild thrombocytopenia (120k). Avastin was added to CDDP taxol with treatment 07-14-15.    Objective:  Vital signs in last 24 hours:  BP (!) 108/57 (BP Location: Left Arm, Patient Position: Sitting)   Pulse 66   Temp 98.1 F (36.7 C) (Oral)   Resp 18   Ht 5' 7"  (1.702 m)   Wt 163 lb (73.9 kg)   SpO2 100%   BMI 25.53 kg/m  Weight up 0.5 lb Alert, oriented and appropriate. Ambulatory without assistance difficulty.  Complete alopecia  HEENT:PERRL, sclerae not icteric. Oral mucosa  moist without lesions, posterior pharynx clear.  Neck supple. No JVD.  Lymphatics:no cervical,supraclavicular or inguinal adenopathy Resp: clear to auscultation bilaterally and normal percussion bilaterally Cardio: regular rate and rhythm. No gallop. GI: soft, nontender including LLQ, not distended, no mass or organomegaly. Normally  active bowel sounds. Soft stool in ostomy bag, stoma pink. Musculoskeletal/ Extremities: without pitting edema, cords, tenderness Neuro: no peripheral neuropathy. Otherwise nonfocal. PSYCH appropriate mood and affect Skin without rash, ecchymosis, petechiae Portacath-without erythema or tenderness  Lab Results:  Results for orders placed or performed in visit on 10/06/15  Urine protein by dipstick  Result Value Ref Range   Protein, ur Negative Negative- <30 mg/dL  CBC with Differential  Result Value Ref Range   WBC 9.1 3.9 - 10.3 10e3/uL   NEUT# 6.3 1.5 - 6.5 10e3/uL   HGB 11.3 (L) 11.6 - 15.9 g/dL   HCT 34.0 (L) 34.8 - 46.6 %   Platelets 133 (L) 145 - 400 10e3/uL   MCV 92.1 79.5 - 101.0 fL   MCH 30.6 25.1 - 34.0 pg   MCHC 33.2 31.5 - 36.0 g/dL   RBC 3.69 (L) 3.70 - 5.45 10e6/uL   RDW 14.1 11.2 - 14.5 %   lymph# 2.0 0.9 - 3.3 10e3/uL   MONO# 0.7 0.1 - 0.9 10e3/uL   Eosinophils Absolute 0.1 0.0 - 0.5 10e3/uL   Basophils Absolute 0.0 0.0 - 0.1 10e3/uL   NEUT% 68.9 38.4 - 76.8 %   LYMPH% 22.3 14.0 - 49.7 %   MONO% 7.5 0.0 - 14.0 %   EOS% 1.1 0.0 - 7.0 %   BASO% 0.2 0.0 - 2.0 %  Comprehensive metabolic panel  Result Value Ref Range   Sodium 142 136 - 145 mEq/L   Potassium 5.1 3.5 - 5.1 mEq/L   Chloride 109 98 - 109 mEq/L   CO2 25 22 - 29 mEq/L   Glucose 97 70 - 140 mg/dl   BUN 11.1 7.0 - 26.0 mg/dL   Creatinine 0.9 0.6 - 1.1 mg/dL   Total Bilirubin 0.31 0.20 - 1.20 mg/dL   Alkaline Phosphatase 135 40 - 150 U/L   AST 15 5 - 34 U/L   ALT 16 0 - 55 U/L   Total Protein 6.9 6.4 - 8.3 g/dL   Albumin 3.5 3.5 - 5.0 g/dL   Calcium 9.2 8.4 - 10.4 mg/dL   Anion Gap 8 3 - 11 mEq/L   EGFR 66 (L) >90 ml/min/1.73 m2  Magnesium  Result Value Ref Range   Magnesium 1.7 1.5 - 2.5 mg/dl     Studies/Results:  No results found.  Medications: I have reviewed the patient's current medications. Prn colace or MOM as noted  DISCUSSION All of interval history reviewed.   She is now  willing to continue treatment even with 7-10 days of poor quality time after each, but wants to be sure to be able to go to beach for Labor Day weekend. Will delay next cycle for 1 week, treatment ~ 8-17 and then after Labor Day.   Assessment/Plan:  1. 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 Treatment is in attempt to shrink and control disease as best possible, not in curative attempt. Regimen now is CDDP taxol avastin, optimally to be used q 3 weeks,  Aloxi and emend with other antiemetics,  IVF (+ IV antiemetics) after rx if unable to do oral hydration adequately. Neulasta by  on pro has supported counts well tho adding to aches.  Cycle 5 CDDP taxol avastin ~ 10-20-15 2.post diverting sigmoid loop colostomy 05-18-15 for distal bowel obstruction from pelvic malignancy. Some constipation for a few days after treatment , plan as above, ostomy functioning well now. She does not have follow up with Dr Marcello Moores 3.PAC in  4.chemo neutropenia with cycle 1 carbo taxol: on pro neulasta  5.nutritional status: po intake better since colostomy, tho difficult for ~ a week after chemo. 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 RN to speak with her by phone about this medication as we did not address during visit 7.sleep problems:Restoril 15 mg effective now 8. No advance directives: continue to address. She does seem to have better understanding of extent of disease and goal of treatment; we are adjusting schedule at this office to allow her beach trips etc. Note patient cared for son with chronic medical problems (I believe cerebral palsy) until his death 57 years ago; daughter also deceased.     All questions answered. Chemo, avastin, neulasta orders will be confirmed by labs shortly prior to  treatment. She knows to call if needed prior to next scheduled appointment. Route PCP    Evlyn Clines, MD   10/06/2015, 11:32 AM

## 2015-10-10 ENCOUNTER — Other Ambulatory Visit: Payer: Self-pay

## 2015-10-10 NOTE — Patient Outreach (Signed)
[facility]) Care Management  10/10/2015  Katherine Boone 05-04-1945 [id number]    Case Closure   Multiple attempts to establish contact with patient. No response from letter mailed to patient. Case is being closed at this time.   Plan: RN CM will notify [facility] administrative assistant of case closure. RN CM will send MD case closure letter.    Enzo Montgomery, RN,BSN,CCM [place] Management Telephonic Care Management Coordinator Direct Phone: [phone] Toll Free: [phone] Fax: [phone]

## 2015-10-21 ENCOUNTER — Ambulatory Visit (HOSPITAL_BASED_OUTPATIENT_CLINIC_OR_DEPARTMENT_OTHER): Payer: Commercial Managed Care - HMO

## 2015-10-21 ENCOUNTER — Other Ambulatory Visit (HOSPITAL_BASED_OUTPATIENT_CLINIC_OR_DEPARTMENT_OTHER): Payer: Commercial Managed Care - HMO

## 2015-10-21 VITALS — BP 136/80 | HR 76 | Temp 97.8°F | Resp 18

## 2015-10-21 DIAGNOSIS — C801 Malignant (primary) neoplasm, unspecified: Secondary | ICD-10-CM

## 2015-10-21 DIAGNOSIS — C799 Secondary malignant neoplasm of unspecified site: Secondary | ICD-10-CM

## 2015-10-21 DIAGNOSIS — Z5112 Encounter for antineoplastic immunotherapy: Secondary | ICD-10-CM | POA: Diagnosis not present

## 2015-10-21 DIAGNOSIS — Z5111 Encounter for antineoplastic chemotherapy: Secondary | ICD-10-CM

## 2015-10-21 DIAGNOSIS — C539 Malignant neoplasm of cervix uteri, unspecified: Secondary | ICD-10-CM

## 2015-10-21 DIAGNOSIS — C7989 Secondary malignant neoplasm of other specified sites: Secondary | ICD-10-CM

## 2015-10-21 DIAGNOSIS — C7801 Secondary malignant neoplasm of right lung: Secondary | ICD-10-CM | POA: Diagnosis not present

## 2015-10-21 DIAGNOSIS — C787 Secondary malignant neoplasm of liver and intrahepatic bile duct: Secondary | ICD-10-CM

## 2015-10-21 DIAGNOSIS — C7802 Secondary malignant neoplasm of left lung: Secondary | ICD-10-CM

## 2015-10-21 LAB — COMPREHENSIVE METABOLIC PANEL
ALBUMIN: 3.8 g/dL (ref 3.5–5.0)
ALK PHOS: 119 U/L (ref 40–150)
ALT: 14 U/L (ref 0–55)
AST: 16 U/L (ref 5–34)
Anion Gap: 9 mEq/L (ref 3–11)
BILIRUBIN TOTAL: 0.44 mg/dL (ref 0.20–1.20)
BUN: 19.4 mg/dL (ref 7.0–26.0)
CALCIUM: 10 mg/dL (ref 8.4–10.4)
CO2: 22 mEq/L (ref 22–29)
CREATININE: 1 mg/dL (ref 0.6–1.1)
Chloride: 108 mEq/L (ref 98–109)
EGFR: 56 mL/min/{1.73_m2} — ABNORMAL LOW (ref >90)
GLUCOSE: 194 mg/dL — AB (ref 70–140)
POTASSIUM: 4.8 meq/L (ref 3.5–5.1)
SODIUM: 139 meq/L (ref 136–145)
TOTAL PROTEIN: 7.6 g/dL (ref 6.4–8.3)

## 2015-10-21 LAB — CBC WITH DIFFERENTIAL/PLATELET
BASO%: 0.2 % (ref 0.0–2.0)
Basophils Absolute: 0 10*3/uL (ref 0.0–0.1)
EOS ABS: 0 10*3/uL (ref 0.0–0.5)
EOS%: 0 % (ref 0.0–7.0)
HCT: 39 % (ref 34.8–46.6)
HEMOGLOBIN: 13 g/dL (ref 11.6–15.9)
LYMPH%: 8.2 % — AB (ref 14.0–49.7)
MCH: 30.6 pg (ref 25.1–34.0)
MCHC: 33.3 g/dL (ref 31.5–36.0)
MCV: 91.8 fL (ref 79.5–101.0)
MONO#: 0 10*3/uL — AB (ref 0.1–0.9)
MONO%: 0.2 % (ref 0.0–14.0)
NEUT%: 91.4 % — ABNORMAL HIGH (ref 38.4–76.8)
NEUTROS ABS: 6.7 10*3/uL — AB (ref 1.5–6.5)
PLATELETS: 203 10*3/uL (ref 145–400)
RBC: 4.25 10*6/uL (ref 3.70–5.45)
RDW: 14.4 % (ref 11.2–14.5)
WBC: 7.4 10*3/uL (ref 3.9–10.3)
lymph#: 0.6 10*3/uL — ABNORMAL LOW (ref 0.9–3.3)

## 2015-10-21 LAB — MAGNESIUM: MAGNESIUM: 1.9 mg/dL (ref 1.5–2.5)

## 2015-10-21 MED ORDER — FAMOTIDINE IN NACL 20-0.9 MG/50ML-% IV SOLN
20.0000 mg | Freq: Once | INTRAVENOUS | Status: AC
  Administered 2015-10-21: 20 mg via INTRAVENOUS

## 2015-10-21 MED ORDER — ACETAMINOPHEN 325 MG PO TABS
650.0000 mg | ORAL_TABLET | Freq: Once | ORAL | Status: AC
  Administered 2015-10-21: 650 mg via ORAL

## 2015-10-21 MED ORDER — SODIUM CHLORIDE 0.9 % IV SOLN
50.0000 mg/m2 | Freq: Once | INTRAVENOUS | Status: AC
  Administered 2015-10-21: 94 mg via INTRAVENOUS
  Filled 2015-10-21: qty 94

## 2015-10-21 MED ORDER — POTASSIUM CHLORIDE 2 MEQ/ML IV SOLN
Freq: Once | INTRAVENOUS | Status: AC
  Administered 2015-10-21: 11:00:00 via INTRAVENOUS
  Filled 2015-10-21: qty 10

## 2015-10-21 MED ORDER — DIPHENHYDRAMINE HCL 50 MG/ML IJ SOLN
50.0000 mg | Freq: Once | INTRAMUSCULAR | Status: AC
  Administered 2015-10-21: 25 mg via INTRAVENOUS

## 2015-10-21 MED ORDER — SODIUM CHLORIDE 0.9 % IV SOLN
Freq: Once | INTRAVENOUS | Status: AC
  Administered 2015-10-21: 10:00:00 via INTRAVENOUS
  Filled 2015-10-21: qty 5

## 2015-10-21 MED ORDER — SODIUM CHLORIDE 0.9 % IV SOLN
135.0000 mg/m2 | Freq: Once | INTRAVENOUS | Status: DC
  Filled 2015-10-21: qty 42

## 2015-10-21 MED ORDER — SODIUM CHLORIDE 0.9 % IV SOLN
Freq: Once | INTRAVENOUS | Status: AC
  Administered 2015-10-21: 09:00:00 via INTRAVENOUS

## 2015-10-21 MED ORDER — PALONOSETRON HCL INJECTION 0.25 MG/5ML
0.2500 mg | Freq: Once | INTRAVENOUS | Status: AC
  Administered 2015-10-21: 0.25 mg via INTRAVENOUS

## 2015-10-21 MED ORDER — FAMOTIDINE IN NACL 20-0.9 MG/50ML-% IV SOLN
INTRAVENOUS | Status: AC
  Filled 2015-10-21: qty 50

## 2015-10-21 MED ORDER — DIPHENHYDRAMINE HCL 50 MG/ML IJ SOLN
INTRAMUSCULAR | Status: AC
Start: 2015-10-21 — End: 2015-10-21
  Filled 2015-10-21: qty 1

## 2015-10-21 MED ORDER — SODIUM CHLORIDE 0.9 % IV SOLN
14.8800 mg/kg | Freq: Once | INTRAVENOUS | Status: AC
  Administered 2015-10-21: 1100 mg via INTRAVENOUS
  Filled 2015-10-21: qty 32

## 2015-10-21 MED ORDER — SODIUM CHLORIDE 0.9% FLUSH
10.0000 mL | INTRAVENOUS | Status: DC | PRN
  Administered 2015-10-21: 10 mL
  Filled 2015-10-21: qty 10

## 2015-10-21 MED ORDER — ACETAMINOPHEN 325 MG PO TABS
ORAL_TABLET | ORAL | Status: AC
  Filled 2015-10-21: qty 2

## 2015-10-21 MED ORDER — PEGFILGRASTIM 6 MG/0.6ML ~~LOC~~ PSKT
6.0000 mg | PREFILLED_SYRINGE | Freq: Once | SUBCUTANEOUS | Status: AC
  Administered 2015-10-21: 6 mg via SUBCUTANEOUS
  Filled 2015-10-21: qty 0.6

## 2015-10-21 MED ORDER — SODIUM CHLORIDE 0.9 % IV SOLN
135.0000 mg/m2 | Freq: Once | INTRAVENOUS | Status: AC
  Administered 2015-10-21: 252 mg via INTRAVENOUS
  Filled 2015-10-21: qty 42

## 2015-10-21 MED ORDER — HEPARIN SOD (PORK) LOCK FLUSH 100 UNIT/ML IV SOLN
500.0000 [IU] | Freq: Once | INTRAVENOUS | Status: AC | PRN
  Administered 2015-10-21: 500 [IU]
  Filled 2015-10-21: qty 5

## 2015-10-21 MED ORDER — PALONOSETRON HCL INJECTION 0.25 MG/5ML
INTRAVENOUS | Status: AC
  Filled 2015-10-21: qty 5

## 2015-10-21 NOTE — Patient Instructions (Signed)
[facility] Discharge Instructions for Patients Receiving Chemotherapy  Today you received the following chemotherapy agents:  Cisplatin, Taxol, and Avastin.  To help prevent nausea and vomiting after your treatment, we encourage you to take your nausea medication as directed.   If you develop nausea and vomiting that is not controlled by your nausea medication, call the clinic.   BELOW ARE SYMPTOMS THAT SHOULD BE REPORTED IMMEDIATELY:  *FEVER GREATER THAN 100.5 F  *CHILLS WITH OR WITHOUT FEVER  NAUSEA AND VOMITING THAT IS NOT CONTROLLED WITH YOUR NAUSEA MEDICATION  *UNUSUAL SHORTNESS OF BREATH  *UNUSUAL BRUISING OR BLEEDING  TENDERNESS IN MOUTH AND THROAT WITH OR WITHOUT PRESENCE OF ULCERS  *URINARY PROBLEMS  *BOWEL PROBLEMS  UNUSUAL RASH Items with * indicate a potential emergency and should be followed up as soon as possible.  Feel free to call the clinic you have any questions or concerns. The clinic phone number is [phone].  Please show the [facility] at check-in to the Emergency Department and triage nurse.

## 2015-10-27 ENCOUNTER — Telehealth: Payer: Self-pay | Admitting: *Deleted

## 2015-10-27 NOTE — Telephone Encounter (Signed)
TC to PT to schedule AWV. Spoke to Husband and is supposed to have her call us back.

## 2015-10-29 ENCOUNTER — Other Ambulatory Visit: Payer: Self-pay | Admitting: Oncology

## 2015-10-29 DIAGNOSIS — C539 Malignant neoplasm of cervix uteri, unspecified: Secondary | ICD-10-CM

## 2015-10-31 ENCOUNTER — Telehealth: Payer: Self-pay | Admitting: Oncology

## 2015-10-31 ENCOUNTER — Other Ambulatory Visit: Payer: Commercial Managed Care - HMO

## 2015-10-31 ENCOUNTER — Ambulatory Visit (HOSPITAL_BASED_OUTPATIENT_CLINIC_OR_DEPARTMENT_OTHER): Payer: Commercial Managed Care - HMO | Admitting: Oncology

## 2015-10-31 ENCOUNTER — Other Ambulatory Visit (HOSPITAL_BASED_OUTPATIENT_CLINIC_OR_DEPARTMENT_OTHER): Payer: Commercial Managed Care - HMO

## 2015-10-31 ENCOUNTER — Encounter: Payer: Self-pay | Admitting: Oncology

## 2015-10-31 VITALS — BP 125/78 | HR 82 | Temp 98.6°F | Resp 18 | Ht 67.0 in | Wt 164.6 lb

## 2015-10-31 DIAGNOSIS — C7989 Secondary malignant neoplasm of other specified sites: Secondary | ICD-10-CM

## 2015-10-31 DIAGNOSIS — C7801 Secondary malignant neoplasm of right lung: Secondary | ICD-10-CM | POA: Diagnosis not present

## 2015-10-31 DIAGNOSIS — C787 Secondary malignant neoplasm of liver and intrahepatic bile duct: Secondary | ICD-10-CM | POA: Diagnosis not present

## 2015-10-31 DIAGNOSIS — C539 Malignant neoplasm of cervix uteri, unspecified: Secondary | ICD-10-CM | POA: Diagnosis not present

## 2015-10-31 DIAGNOSIS — C7802 Secondary malignant neoplasm of left lung: Secondary | ICD-10-CM

## 2015-10-31 DIAGNOSIS — C801 Malignant (primary) neoplasm, unspecified: Secondary | ICD-10-CM | POA: Diagnosis not present

## 2015-10-31 DIAGNOSIS — Z933 Colostomy status: Secondary | ICD-10-CM

## 2015-10-31 DIAGNOSIS — D701 Agranulocytosis secondary to cancer chemotherapy: Secondary | ICD-10-CM

## 2015-10-31 DIAGNOSIS — B009 Herpesviral infection, unspecified: Secondary | ICD-10-CM

## 2015-10-31 DIAGNOSIS — Z5112 Encounter for antineoplastic immunotherapy: Secondary | ICD-10-CM

## 2015-10-31 LAB — COMPREHENSIVE METABOLIC PANEL
ALBUMIN: 3.4 g/dL — AB (ref 3.5–5.0)
ALK PHOS: 122 U/L (ref 40–150)
ALT: 17 U/L (ref 0–55)
AST: 14 U/L (ref 5–34)
Anion Gap: 11 mEq/L (ref 3–11)
BUN: 7.6 mg/dL (ref 7.0–26.0)
CALCIUM: 8.9 mg/dL (ref 8.4–10.4)
CHLORIDE: 109 meq/L (ref 98–109)
CO2: 26 mEq/L (ref 22–29)
Creatinine: 0.9 mg/dL (ref 0.6–1.1)
EGFR: 65 mL/min/{1.73_m2} — AB (ref >90)
Glucose: 82 mg/dl (ref 70–140)
POTASSIUM: 4 meq/L (ref 3.5–5.1)
SODIUM: 145 meq/L (ref 136–145)
Total Bilirubin: <0.3 mg/dL (ref 0.20–1.20)
Total Protein: 6.7 g/dL (ref 6.4–8.3)

## 2015-10-31 LAB — CBC WITH DIFFERENTIAL/PLATELET
BASO%: 0.4 % (ref 0.0–2.0)
BASOS ABS: 0 10*3/uL (ref 0.0–0.1)
EOS ABS: 0.1 10*3/uL (ref 0.0–0.5)
EOS%: 0.7 % (ref 0.0–7.0)
HEMATOCRIT: 33.4 % — AB (ref 34.8–46.6)
HEMOGLOBIN: 11.1 g/dL — AB (ref 11.6–15.9)
LYMPH%: 19.6 % (ref 14.0–49.7)
MCH: 30.7 pg (ref 25.1–34.0)
MCHC: 33.2 g/dL (ref 31.5–36.0)
MCV: 92.5 fL (ref 79.5–101.0)
MONO#: 1.2 10*3/uL — ABNORMAL HIGH (ref 0.1–0.9)
MONO%: 11.6 % (ref 0.0–14.0)
NEUT#: 7.2 10*3/uL — ABNORMAL HIGH (ref 1.5–6.5)
NEUT%: 67.7 % (ref 38.4–76.8)
Platelets: 114 10*3/uL — ABNORMAL LOW (ref 145–400)
RBC: 3.61 10*6/uL — ABNORMAL LOW (ref 3.70–5.45)
RDW: 13.4 % (ref 11.2–14.5)
WBC: 10.7 10*3/uL — ABNORMAL HIGH (ref 3.9–10.3)
lymph#: 2.1 10*3/uL (ref 0.9–3.3)

## 2015-10-31 LAB — MAGNESIUM: MAGNESIUM: 1.3 mg/dL — AB (ref 1.5–2.5)

## 2015-10-31 LAB — UA PROTEIN, DIPSTICK - CHCC: PROTEIN: NEGATIVE mg/dL

## 2015-10-31 MED ORDER — MAGNESIUM 400 MG PO CAPS
ORAL_CAPSULE | ORAL | 0 refills | Status: DC
Start: 2015-10-31 — End: 2015-10-31

## 2015-10-31 MED ORDER — MAGNESIUM 400 MG PO CAPS: 400.0000 mg | ORAL_CAPSULE | ORAL | 0 refills | Status: DC

## 2015-10-31 NOTE — Progress Notes (Signed)
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 She has had one cycle to [place] taxol 05-12-15, then 5 cycles CDDP/ taxol/ avastin thru 10-21-15. She will have follow up visit with Dr Josephina Shih on 12-02-15  Patient again felt very weak and badly in general for ~ 10 days after most recent chemotherapy, despite trying to push po fluids. She is feeling better now, but not yet back to her preferred status. Nausea was controlled and she did not have vomiting. The ostomy continues to function without difficulty, frequently fairly liquid output. She feels rectal pressure as tho she needs to move bowels, tried enema without results, not pain or bleeding. Only peripheral neuropathy has been intermittent tingling in right foot; she mentions transient numbness left leg and perioral just after chemo. Bladder ok. No SOB or cough, no chest pain. No LE swelling. No bleeding. No HA or other neurologic symptoms. No problems with PAC. Remainder of 10 point Review of Systems negative  PAC flushed 10-21-15 She plans to go to beach with family for Labor Day weekend  [place]  Patient was in her usual very good health until new cramping abdominal pain intermittently for last few months. She had some constipation with this, particularly severe when she saw PCP for this reason on 04-26-15. She has had no vaginal bleeding. She had no bowel obstruction by xray and bowels moved with miralax. CT AP 04-26-15 had 10.3 x 13.8 mixed cystic and solid mass involving uterus and extending posteriorly into cul de sac and into bilateral adnexal regions, sigmoid colon tethered to mass, no ascites, scattered necrotic lymph nodes in mesentery  and omentum, no hydroureter, 3 lesions in liver up to 3.4 cm, and numerous bilateral pulmonary nodules in lung bases. CEA was 1.1 and CA 125 was 105; CBC and CMET 04-26-15 were entirely normal. 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 She had CT biopsy of pelvic mass in region of left psoas on 3-1-1 7. Pathology [phone], showed high grade poorly differentiated carcinoma with immunohistochemical stains suggesting cervical primary, which did not fit clinical picture well. CT chest 05-10-15 showed multiple bilateral pulmonary nodules (>40), 3 liver lesions up to 3.7 cm, left supraclavicular node and left hilar nodes. Chemotherapy was begun with first carboplatin and taxol on 05-12-15. Admitted with colonic obstruction 05-15-15, diverting sigmoid loop colostomy 05-18-15. Neutropenic 05-19-15 (day 8 cycle 1) despite granix begun day 5. Chemo changed to CDDP taxol with review of the pathology information, cycle 1 delayed due to inadequate IV access on 06-09-15. She had first CDDP taxol on 06-23-15 with on pro neulasta, no documented neutropenia and only mild thrombocytopenia (120k). Avastin was added to CDDP taxol with treatment 07-14-15.    Objective:  Vital signs in last 24 hours:  BP 125/78 (BP Location: Left Arm, Patient Position: Sitting)   Pulse 82   Temp 98.6 F (37 C) (Oral)   Resp 18   Ht 5\' 7"  (1.702 m)   Wt 164 lb 9.6 oz (74.7 kg)   SpO2 100%   BMI 25.78 kg/m  Weight up 1.5 lbs. Looks comfortable, respirations not labored, easily mobile. Alert, oriented and appropriate, very pleasant as always.. Ambulatory without difficulty.  Alopecia  HEENT:PERRL, sclerae not icteric. Oral mucosa moist without lesions, posterior pharynx clear.  Neck supple. No JVD.  Lymphatics:no cervical,supraclavicular adenopathy Resp: clear to auscultation bilaterally and normal percussion bilaterally Cardio: regular rate and rhythm. No gallop. GI: soft,  nontender, not distended, no mass or organomegaly. Normally active bowel sounds.Ostomy site not remarkable, loose stool in ostomy bag. Surgical incision not remarkable. Musculoskeletal/ Extremities: without pitting edema, cords, tenderness Neuro: no significant peripheral neuropathy. Otherwise nonfocal. PSYCH appropriate mood and affect Skin without rash, ecchymosis, petechiae Portacath-without erythema or tenderness  Lab Results:  Results for orders placed or performed in visit on 10/31/15  CBC with Differential  Result Value Ref Range   WBC 10.7 (H) 3.9 - 10.3 10e3/uL   NEUT# 7.2 (H) 1.5 - 6.5 10e3/uL   HGB 11.1 (L) 11.6 - 15.9 g/dL   HCT 33.4 (L) 34.8 - 46.6 %   Platelets 114 (L) 145 - 400 10e3/uL   MCV 92.5 79.5 - 101.0 fL   MCH 30.7 25.1 - 34.0 pg   MCHC 33.2 31.5 - 36.0 g/dL   RBC 3.61 (L) 3.70 - 5.45 10e6/uL   RDW 13.4 11.2 - 14.5 %   lymph# 2.1 0.9 - 3.3 10e3/uL   MONO# 1.2 (H) 0.1 - 0.9 10e3/uL   Eosinophils Absolute 0.1 0.0 - 0.5 10e3/uL   Basophils Absolute 0.0 0.0 - 0.1 10e3/uL   NEUT% 67.7 38.4 - 76.8 %   LYMPH% 19.6 14.0 - 49.7 %   MONO% 11.6 0.0 - 14.0 %   EOS% 0.7 0.0 - 7.0 %   BASO% 0.4 0.0 - 2.0 %  Urine protein by dipstick  Result Value Ref Range   Protein, ur Negative Negative- <30 mg/dL   CMET normal with exception of albumin 3.4, including creat 0.9, K 4.0, LFTs and Ca normal Magnesium low at 1.3, having been 1.8 day of last chemo  Studies/Results:  No results found.  Medications: I have reviewed the patient's current medications. Add magnesium oxide 400 mg >=3x weekly if tolerated, tho would hold this if increases liquid stool by ostomy significantly.  DISCUSSION Interval history and meds as above.  Labs reviewed at time of visit. Suggested we give cycle 6 chemo after Labor Day/  prior to upcoming visit with Dr Josephina Shih, however patient requests taking a few weeks' break. As she had scans done after cycle 3 CDDP Taxol avastin, will not repeat  these at least prior to cycle 6.  We have discussed again fact that treatment is being used to keep disease controlled, which will probably require ongoing systemic therapy of some type. She would like to hear Dr Tamela Oddi thoughts on present regimen vs something less rigorous.   Assessment/Plan:  1. 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 Treatment is in attempt to shrink and control disease as best possible, not in curative attempt. Regimen now is CDDP taxol avastin, optimally to be used q 3 weeks, Aloxi and emend with other antiemetics, IVF (+ IV antiemetics) after rx if unable to do oral hydration adequately. Neulasta by on pro has supported counts well tho adding to aches.  Cycle 5 CDDP taxol avastin given 10-20-15. Last imaging after cycle 3, CT CAP 09-13-15. To see Dr Josephina Shih late Sept, other treatment from there to be decided. I will see her back early Oct. 2.post diverting sigmoid loop colostomy 05-18-15 for distal bowel obstruction from pelvic malignancy.  Ostomy functioning well .  3.PAC in  4.chemo neutropenia with cycle 1 carbo taxol: on pro neulasta  5.nutritional status: po intake better since colostomy, tho difficult for ~ a week after chemo. 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 RN to speak with her by phone about this medication as we did not address during visit 7.sleep problems:Restoril 15 mg effective now 8. No advance directives: continue to address. She does seem to have better understanding of extent of disease and goal of treatment; we are adjusting schedule at this office to allow her beach trips etc. Note patient cared for son with chronic medical problems (I believe cerebral palsy) until his death 23 years ago; one daughter also deceased.  9.  Hypomagnesemia related to CDDP: K fine. Try po magnesium if able to tolerate. Break from CDDP will also help   All questions answered and patient is comfortable with plans above. Time spent 25 min including >50% counseling and coordination of care.   Evlyn Clines, MD   10/31/2015, 9:17 AM

## 2015-10-31 NOTE — Telephone Encounter (Signed)
appt made and avs printed °

## 2015-11-02 ENCOUNTER — Telehealth: Payer: Self-pay

## 2015-11-02 NOTE — Telephone Encounter (Signed)
[facility] called stating pt was thinking she was low on potassium. Called pt back that her potassium is WNL, her magnesium is low. She does have her magoxide tabs.

## 2015-11-08 DIAGNOSIS — Z933 Colostomy status: Secondary | ICD-10-CM | POA: Diagnosis not present

## 2015-11-08 DIAGNOSIS — Z85038 Personal history of other malignant neoplasm of large intestine: Secondary | ICD-10-CM | POA: Diagnosis not present

## 2015-11-25 ENCOUNTER — Ambulatory Visit: Payer: Commercial Managed Care - HMO

## 2015-11-25 ENCOUNTER — Other Ambulatory Visit: Payer: Commercial Managed Care - HMO

## 2015-12-02 ENCOUNTER — Ambulatory Visit: Payer: Commercial Managed Care - HMO | Attending: Gynecology | Admitting: Gynecology

## 2015-12-02 ENCOUNTER — Encounter: Payer: Self-pay | Admitting: Gynecology

## 2015-12-02 VITALS — BP 123/62 | HR 75 | Temp 98.0°F | Resp 18 | Ht 67.0 in | Wt 163.5 lb

## 2015-12-02 DIAGNOSIS — Z800 Family history of malignant neoplasm of digestive organs: Secondary | ICD-10-CM | POA: Diagnosis not present

## 2015-12-02 DIAGNOSIS — R918 Other nonspecific abnormal finding of lung field: Secondary | ICD-10-CM | POA: Insufficient documentation

## 2015-12-02 DIAGNOSIS — C801 Malignant (primary) neoplasm, unspecified: Secondary | ICD-10-CM | POA: Diagnosis not present

## 2015-12-02 DIAGNOSIS — Z7983 Long term (current) use of bisphosphonates: Secondary | ICD-10-CM | POA: Diagnosis not present

## 2015-12-02 DIAGNOSIS — C539 Malignant neoplasm of cervix uteri, unspecified: Secondary | ICD-10-CM

## 2015-12-02 DIAGNOSIS — R590 Localized enlarged lymph nodes: Secondary | ICD-10-CM | POA: Diagnosis not present

## 2015-12-02 DIAGNOSIS — Z8249 Family history of ischemic heart disease and other diseases of the circulatory system: Secondary | ICD-10-CM | POA: Insufficient documentation

## 2015-12-02 DIAGNOSIS — Z825 Family history of asthma and other chronic lower respiratory diseases: Secondary | ICD-10-CM | POA: Insufficient documentation

## 2015-12-02 DIAGNOSIS — C530 Malignant neoplasm of endocervix: Secondary | ICD-10-CM | POA: Insufficient documentation

## 2015-12-02 DIAGNOSIS — M810 Age-related osteoporosis without current pathological fracture: Secondary | ICD-10-CM | POA: Diagnosis not present

## 2015-12-02 DIAGNOSIS — Z801 Family history of malignant neoplasm of trachea, bronchus and lung: Secondary | ICD-10-CM | POA: Diagnosis not present

## 2015-12-02 DIAGNOSIS — R1900 Intra-abdominal and pelvic swelling, mass and lump, unspecified site: Secondary | ICD-10-CM | POA: Diagnosis not present

## 2015-12-02 DIAGNOSIS — K219 Gastro-esophageal reflux disease without esophagitis: Secondary | ICD-10-CM | POA: Insufficient documentation

## 2015-12-02 DIAGNOSIS — C7802 Secondary malignant neoplasm of left lung: Secondary | ICD-10-CM

## 2015-12-02 DIAGNOSIS — Z79899 Other long term (current) drug therapy: Secondary | ICD-10-CM | POA: Diagnosis not present

## 2015-12-02 DIAGNOSIS — C7989 Secondary malignant neoplasm of other specified sites: Secondary | ICD-10-CM | POA: Diagnosis not present

## 2015-12-02 DIAGNOSIS — Z933 Colostomy status: Secondary | ICD-10-CM | POA: Diagnosis not present

## 2015-12-02 DIAGNOSIS — C787 Secondary malignant neoplasm of liver and intrahepatic bile duct: Secondary | ICD-10-CM

## 2015-12-02 DIAGNOSIS — C7801 Secondary malignant neoplasm of right lung: Secondary | ICD-10-CM

## 2015-12-02 NOTE — Progress Notes (Signed)
Consult Note: Gyn-Onc   Katherine Boone 70 y.o. female  Chief Complaint  Patient presents with  . pelvic mass in female    follow up visit    AssessmentAnd plan  The patient has had a remarkable response to chemotherapy. After 3 cycles CT scan showed significant reduction in tumor burden and all metastatic sites. Patient seems to be tolerating the therapy reasonably well with only while neuropathy has a complaint.  Prior to making further decisions regarding ongoing therapy I would suggest we obtain another CT scan of the chest abdomen and pelvis. The pros and cons of continuing chemotherapy (as long as the patient is responding) versus discontinuing chemotherapy were discussed. Patient seems to be inclined to continue therapy as long as it is not too toxic and is of benefit to her. We'll make that decision after seeing the CT scan. The patient scheduled to see Dr. Marko Plume October 12 and we will hope to have the scan prior to that so we can assist in decision making.    Interval history: Patient returns today having completed 5 cycles of cisplatin Taxol and a Avastin. After 3 cycles she had had an excellent partial response. Overall she sees be tolerating the chemotherapy well although she's fatigue for approximately week after. Her functional status is otherwise good. She denies any abdominal or pelvic pain pressure GI or GU symptoms. Her colostomy is functioning well.  HPI:70 year old white married female seen in consultation request of Dr.Javier Danise Mina for management of a newly diagnosed advanced carcinoma likely of gynecologic origin.  The patient initially presented on 04/26/2015 with GI symptoms especially constipation and difficulty having bowel movements and passing gas. In retrospect the patient reports that she's had "pain in her ovaries" since this past summer". CT scan showed the following findings: Numerous bilateral pulmonary nodules consistent with metastatic disease are  associated with liver lesions, omental nodules, mesenteric nodules, necrotic retroperitoneal lymphadenopathy in a mixed cystic and solid 10 x 13 cm mass in the central pelvis involving the uterus extending into the cul-de-sac and both adnexal regions.   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 (High-grade poorly differentiated) initial chemotherapy on 05/12/2015 was carboplatin and Taxol. The patient then presented with a colonic obstruction requiring a diverting loop colostomy on 05/18/2015. With pathology indicating an endocervical adenocarcinoma the patient's chemotherapy regimen was switched to cisplatin, Taxol, and a Avastin. After 3 cycles of chemotherapy ACT scan demonstrated impressive response and all metastatic sites.   Review of Systems:10 point review of systems is negative except as noted in interval history.   Vitals: Blood pressure 123/62, pulse 75, temperature 98 F (36.7 C), temperature source Oral, resp. rate 18, height 5\' 7"  (1.702 m), weight 163 lb 8 oz (74.2 kg), SpO2 99 %.  Physical Exam: General : The patient is a healthy woman in no acute distress.  HEENT: normocephalic, extraoccular movements normal; neck is supple without thyromegally  Lynphnodes: Supraclavicular and inguinal nodes not enlarged  Abdomen: Soft, non-tender, no ascites, no organomegally, no masses, no hernias  Pelvic:  EGBUS: Normal female  Vagina: Normal, no lesions  Urethra and Bladder: Normal, non-tender  Cervix: Normal  Uterus: Anterior normal shape size and consistency Bi-manual examination: Non-tender; no adenxal masses or nodularity  Rectal: normal sphincter tone, no masses, no blood  Lower extremities: No edema or varicosities. Normal range of motion      No Known Allergies  Past Medical History:  Diagnosis Date  .  Cancer (HCC)    Cervical  . Family history of cancer    stomach, colon, lung,  throat  . GERD (gastroesophageal reflux disease)    diet controlled  . Gout 03/2005   ?  Marland Kitchen Migraines    none recently  . Osteoporosis 02/2011   DEXA 12/12 - Lspine -2.6; femur -3.8    Past Surgical History:  Procedure Laterality Date  . APPENDECTOMY  1960  . LAPAROSCOPIC DIVERTED COLOSTOMY N/A 05/18/2015   Procedure: LAPAROSCOPIC DIVERTED LOOP COLOSTOMY ;  Surgeon: Leighton Ruff, MD;  Location: [facility];  Service: General;  Laterality: N/A;    Current Outpatient Prescriptions  Medication Sig Dispense Refill  . omeprazole (PRILOSEC) 20 MG capsule Take 1 capsule (20 mg total) by mouth daily. 30 capsule 3  . oxyCODONE (OXY IR/ROXICODONE) 5 MG immediate release tablet Take 1 tablet (5 mg total) by mouth every 6 (six) hours as needed for moderate pain. 15 tablet 0  . temazepam (RESTORIL) 15 MG capsule Take 1 capsule (15 mg total) by mouth at bedtime as needed for sleep. 30 capsule 0  . acyclovir (ZOVIRAX) 400 MG tablet Take 1 tablet (400 mg total) by mouth 2 (two) times daily. (Patient not taking: Reported on 12/02/2015) 60 tablet 2  . alendronate (FOSAMAX) 70 MG tablet Take 1 tablet (70 mg total) by mouth every 7 (seven) days. Take with a full glass of water on an empty stomach. (Patient not taking: Reported on 12/02/2015) 4 tablet 11  . Calcium Carbonate-Vit D-Min 1200-1000 MG-UNIT CHEW Chew 1 tablet by mouth daily. (Patient not taking: Reported on 12/02/2015)    . Cholecalciferol (VITAMIN D3) 1000 UNITS CAPS Take 1 capsule (1,000 Units total) by mouth daily. (Patient not taking: Reported on 12/02/2015) 30 capsule   . cyanocobalamin 100 MCG tablet Take 100 mcg by mouth daily. Reported on 09/15/2015    . dexamethasone (DECADRON) 4 MG tablet Take 5 tablets (20mg ) with food 12 hours and 6 hours before taxol (Patient not taking: Reported on 12/02/2015) 30 tablet 0  . lidocaine-prilocaine (EMLA) cream Apply to Portacath  1-2 hrs prior to access. (Patient not taking: Reported on 12/02/2015) 30 g 1  .  LORazepam (ATIVAN) 0.5 MG tablet Take 1 tablet (0.5 mg total) by mouth every 6 (six) hours as needed (nausea). Or under the tongue. Will make drowsy. (Patient not taking: Reported on 12/02/2015) 20 tablet 0  . Magnesium 400 MG CAPS Take 400 mg by mouth 3 (three) times a week. Hold if excessive diarrhea. (Patient not taking: Reported on 12/02/2015) 30 capsule 0  . ondansetron (ZOFRAN) 8 MG tablet Take 1 tablet (8 mg total) by mouth every 8 (eight) hours as needed for nausea or vomiting. Will not make drowsy (Patient not taking: Reported on 12/02/2015) 30 tablet 0  . prochlorperazine (COMPAZINE) 10 MG tablet Take 1 tablet (10 mg total) by mouth every 6 (six) hours as needed for nausea or vomiting. (Patient not taking: Reported on 12/02/2015) 20 tablet 0  . valACYclovir (VALTREX) 1000 MG tablet Take 1 tablet (1,000 mg total) by mouth daily. Change to prophylactic dose after initial high dose. (Patient not taking: Reported on 12/02/2015) 30 tablet 1   No current facility-administered medications for this visit.    Facility-Administered Medications Ordered in Other Visits  Medication Dose Route Frequency Provider Last Rate Last Dose  . heparin lock flush 100 unit/mL  500 Units Intravenous Once Lennis P Livesay, MD      . sodium chloride flush (NS) 0.9 %  injection 10 mL  10 mL Intravenous PRN Lennis Marion Downer, MD        Social History   Social History  . Marital status: Legally Separated    Spouse name: N/A  . Number of children: 2  . Years of education: N/A   Occupational History  .  [employer]   Social History Main Topics  . Smoking status: Never Smoker  . Smokeless tobacco: Never Used  . Alcohol use No  . Drug use: No  . Sexual activity: Not on file   Other Topics Concern  . 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 Heart disease Mother   . Hypertension Mother   . Cancer Mother 8    colon and stomach  . Cancer Brother     stomach and throat, smoker  . Hypertension Sister   . Cancer Brother     stomach and lung, smoker  . COPD Brother   . Cancer Father     stomach cancer  . Ovarian cancer      maternal neice  . Throat cancer Brother   . Stroke Sister   . Cancer Other     niece - stomach cancer  . Diabetes Neg Hx   . Coronary artery disease Neg Hx       Marti Sleigh, MD 12/02/2015, 2:06 PM       Consult Note: Gyn-Onc   Katherine Boone 71 y.o. female  Chief Complaint  Patient presents with  . pelvic mass in female    follow up visit    Assessment :  Plan:  Interval History:   HPI:  Review of Systems:10 point review of systems is negative except as noted in interval history.   Vitals: Blood pressure 123/62, pulse 75, temperature 98 F (36.7 C), temperature source Oral, resp. rate 18, height 5\' 7"  (1.702 m), weight 163 lb 8 oz (74.2 kg), SpO2 99 %.  Physical Exam: General : The patient is a healthy woman in no acute distress.  HEENT: normocephalic, extraoccular movements normal; neck is supple without thyromegally  Lynphnodes: Supraclavicular and inguinal nodes not enlarged  Abdomen: Soft, non-tender, no ascites, no organomegally, no masses, no hernias  Pelvic:  EGBUS: Normal female  Vagina: Normal, no lesions  Urethra and Bladder: Normal, non-tender  Cervix: Surgically absent  Uterus: Surgically absent  Bi-manual examination: Non-tender; no adenxal masses or nodularity  Rectal: normal sphincter tone, no masses, no blood  Lower extremities: No edema or varicosities. Normal range of motion      No  Known Allergies  Past Medical History:  Diagnosis Date  . Cancer (HCC)    Cervical  . Family history of cancer    stomach, colon, lung, throat  . GERD (gastroesophageal reflux disease)    diet controlled  . Gout 03/2005   ?  Marland Kitchen Migraines    none recently  . Osteoporosis 02/2011   DEXA 12/12 - Lspine -2.6; femur -3.8    Past Surgical History:  Procedure Laterality Date  .  APPENDECTOMY  1960  . LAPAROSCOPIC DIVERTED COLOSTOMY N/A 05/18/2015   Procedure: LAPAROSCOPIC DIVERTED LOOP COLOSTOMY ;  Surgeon: Leighton Ruff, MD;  Location: [facility];  Service: General;  Laterality: N/A;    Current Outpatient Prescriptions  Medication Sig Dispense Refill  . omeprazole (PRILOSEC) 20 MG capsule Take 1 capsule (20 mg total) by mouth daily. 30 capsule 3  . oxyCODONE (OXY IR/ROXICODONE) 5 MG immediate release tablet Take 1 tablet (5 mg total) by mouth every 6 (six) hours as needed for moderate pain. 15 tablet 0  . temazepam (RESTORIL) 15 MG capsule Take 1 capsule (15 mg total) by mouth at bedtime as needed for sleep. 30 capsule 0  . acyclovir (ZOVIRAX) 400 MG tablet Take 1 tablet (400 mg total) by mouth 2 (two) times daily. (Patient not taking: Reported on 12/02/2015) 60 tablet 2  . alendronate (FOSAMAX) 70 MG tablet Take 1 tablet (70 mg total) by mouth every 7 (seven) days. Take with a full glass of water on an empty stomach. (Patient not taking: Reported on 12/02/2015) 4 tablet 11  . Calcium Carbonate-Vit D-Min 1200-1000 MG-UNIT CHEW Chew 1 tablet by mouth daily. (Patient not taking: Reported on 12/02/2015)    . Cholecalciferol (VITAMIN D3) 1000 UNITS CAPS Take 1 capsule (1,000 Units total) by mouth daily. (Patient not taking: Reported on 12/02/2015) 30 capsule   . cyanocobalamin 100 MCG tablet Take 100 mcg by mouth daily. Reported on 09/15/2015    . dexamethasone (DECADRON) 4 MG tablet Take 5 tablets (20mg ) with food 12 hours and 6 hours before taxol (Patient not taking: Reported on 12/02/2015) 30 tablet 0   . lidocaine-prilocaine (EMLA) cream Apply to Portacath  1-2 hrs prior to access. (Patient not taking: Reported on 12/02/2015) 30 g 1  . LORazepam (ATIVAN) 0.5 MG tablet Take 1 tablet (0.5 mg total) by mouth every 6 (six) hours as needed (nausea). Or under the tongue. Will make drowsy. (Patient not taking: Reported on 12/02/2015) 20 tablet 0  . Magnesium 400 MG CAPS Take 400 mg by mouth 3 (three) times a week. Hold if excessive diarrhea. (Patient not taking: Reported on 12/02/2015) 30 capsule 0  . ondansetron (ZOFRAN) 8 MG tablet Take 1 tablet (8 mg total) by mouth every 8 (eight) hours as needed for nausea or vomiting. Will not make drowsy (Patient not taking: Reported on 12/02/2015) 30 tablet 0  . prochlorperazine (COMPAZINE) 10 MG tablet Take 1 tablet (10 mg total) by mouth every 6 (six) hours as needed for nausea or vomiting. (Patient not taking: Reported on 12/02/2015) 20 tablet 0  . valACYclovir (VALTREX) 1000 MG tablet Take 1 tablet (1,000 mg total) by mouth daily. Change to prophylactic dose after initial high dose. (Patient not taking: Reported on 12/02/2015) 30 tablet 1   No current facility-administered medications for this visit.    Facility-Administered Medications Ordered in Other Visits  Medication Dose Route Frequency Provider Last Rate Last Dose  . heparin lock flush 100 unit/mL  500 Units Intravenous Once Lennis P Livesay, MD      . sodium chloride flush (NS) 0.9 % injection 10 mL  10 mL Intravenous PRN Lennis Marion Downer, MD        Social History   Social History  . Marital status: Legally Separated    Spouse name: N/A  . Number of children: 2  . Years of education: N/A   Occupational History  .  [employer]   Social History Main Topics  . Smoking status: Never  Smoker  . Smokeless tobacco: Never Used  . Alcohol use No  . Drug use: No  . Sexual activity: Not on file   Other Topics Concern  . 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 Heart disease Mother   . Hypertension Mother   . Cancer Mother 58    colon and stomach  . Cancer Brother     stomach and throat, smoker  . Hypertension Sister   . Cancer Brother     stomach and lung, smoker  . COPD Brother   . Cancer Father     stomach cancer  . Ovarian cancer      maternal neice  . Throat cancer Brother   . Stroke Sister   . Cancer Other     niece - stomach cancer  . Diabetes Neg Hx   . Coronary artery disease Neg Hx       Marti Sleigh, MD 12/02/2015, 2:06 PM

## 2015-12-02 NOTE — Patient Instructions (Signed)
Plan to have a CT scan of the chest, abdomen, and pelvis prior to seeing Dr. Marko Plume in October.  Please call for any questions or concerns.

## 2015-12-05 ENCOUNTER — Other Ambulatory Visit: Payer: Self-pay | Admitting: Family Medicine

## 2015-12-05 DIAGNOSIS — Z1159 Encounter for screening for other viral diseases: Secondary | ICD-10-CM

## 2015-12-05 DIAGNOSIS — E785 Hyperlipidemia, unspecified: Secondary | ICD-10-CM

## 2015-12-05 DIAGNOSIS — M810 Age-related osteoporosis without current pathological fracture: Secondary | ICD-10-CM

## 2015-12-06 ENCOUNTER — Ambulatory Visit (INDEPENDENT_AMBULATORY_CARE_PROVIDER_SITE_OTHER): Payer: Commercial Managed Care - HMO

## 2015-12-06 VITALS — BP 118/70 | HR 75 | Temp 97.9°F | Ht 64.5 in | Wt 163.5 lb

## 2015-12-06 DIAGNOSIS — E785 Hyperlipidemia, unspecified: Secondary | ICD-10-CM | POA: Diagnosis not present

## 2015-12-06 DIAGNOSIS — Z1159 Encounter for screening for other viral diseases: Secondary | ICD-10-CM | POA: Diagnosis not present

## 2015-12-06 DIAGNOSIS — C799 Secondary malignant neoplasm of unspecified site: Secondary | ICD-10-CM | POA: Diagnosis not present

## 2015-12-06 DIAGNOSIS — M810 Age-related osteoporosis without current pathological fracture: Secondary | ICD-10-CM

## 2015-12-06 DIAGNOSIS — Z0000 Encounter for general adult medical examination without abnormal findings: Secondary | ICD-10-CM

## 2015-12-06 DIAGNOSIS — Z23 Encounter for immunization: Secondary | ICD-10-CM

## 2015-12-06 LAB — LIPID PANEL
Cholesterol: 208 mg/dL — ABNORMAL HIGH (ref 0–200)
HDL: 55.2 mg/dL (ref >39.00)
LDL Cholesterol: 115 mg/dL — ABNORMAL HIGH (ref 0–99)
NonHDL: 152.56
TRIGLYCERIDES: 189 mg/dL — AB (ref 0.0–149.0)
Total CHOL/HDL Ratio: 4
VLDL: 37.8 mg/dL (ref 0.0–40.0)

## 2015-12-06 LAB — VITAMIN D 25 HYDROXY (VIT D DEFICIENCY, FRACTURES): VITD: 20.91 ng/mL — ABNORMAL LOW (ref 30.00–100.00)

## 2015-12-06 LAB — MAGNESIUM: Magnesium: 1.5 mg/dL (ref 1.5–2.5)

## 2015-12-06 NOTE — Progress Notes (Signed)
Pre visit review using our clinic review tool, if applicable. No additional management support is needed unless otherwise documented below in the visit note. 

## 2015-12-06 NOTE — Progress Notes (Signed)
PCP notes:   Health maintenance:  Flu vaccine - administered PPSV23 - administered Shingles - not a candidate Colon cancer screening - will discuss with PCP Mammogram - will discuss with PCP  Abnormal screenings:   Hearing - failed  Patient concerns:   None  Nurse concerns:  None  Next PCP appt:   12/16/15 @ 1545

## 2015-12-06 NOTE — Progress Notes (Signed)
Subjective:   Katherine Boone is a 70 y.o. female who presents for Medicare Annual (Subsequent) preventive examination.  Review of Systems:  N/A Cardiac Risk Factors include: advanced age (>87men, >70 women)     Objective:     Vitals: BP 118/70 (BP Location: Right Arm, Patient Position: Sitting, Cuff Size: Normal)   Pulse 75   Temp 97.9 F (36.6 C) (Oral)   Ht 5' 4.5" (1.638 m) Comment: no shoes  Wt 163 lb 8 oz (74.2 kg)   SpO2 97%   BMI 27.63 kg/m   Body mass index is 27.63 kg/m.   Tobacco History  Smoking Status  . Never Smoker  Smokeless Tobacco  . Never Used     Counseling given: No   Past Medical History:  Diagnosis Date  . Cancer (HCC)    Cervical  . Family history of cancer    stomach, colon, lung, throat  . GERD (gastroesophageal reflux disease)    diet controlled  . Gout 03/2005   ?  Marland Kitchen Migraines    none recently  . Osteoporosis 02/2011   DEXA 12/12 - Lspine -2.6; femur -3.8   Past Surgical History:  Procedure Laterality Date  . APPENDECTOMY  1960  . LAPAROSCOPIC DIVERTED COLOSTOMY N/A 05/18/2015   Procedure: LAPAROSCOPIC DIVERTED LOOP COLOSTOMY ;  Surgeon: Leighton Ruff, MD;  Location: [facility];  Service: General;  Laterality: N/A;   Family History  Problem Relation Age of Onset  . Heart disease Mother   . Hypertension Mother   . Cancer Mother 77    colon and stomach  . Cancer Brother     stomach and throat, smoker  . Hypertension Sister   . Cancer Brother     stomach and lung, smoker  . COPD Brother   . Cancer Father     stomach cancer  . Ovarian cancer      maternal neice  . Throat cancer Brother   . Stroke Sister   . Cancer Other     niece - stomach cancer  . Diabetes Neg Hx   . Coronary artery disease Neg Hx    History  Sexual Activity  . Sexual activity: No    Outpatient Encounter Prescriptions as of 12/06/2015  Medication Sig  . dexamethasone (DECADRON) 4 MG tablet Take 5 tablets (20mg ) with food 12 hours and 6 hours  before taxol  . lidocaine-prilocaine (EMLA) cream Apply to Portacath  1-2 hrs prior to access.  . Magnesium 400 MG CAPS Take 400 mg by mouth 3 (three) times a week. Hold if excessive diarrhea.  Marland Kitchen omeprazole (PRILOSEC) 20 MG capsule Take 1 capsule (20 mg total) by mouth daily. (Patient taking differently: Take 20 mg by mouth daily as needed. )  . [DISCONTINUED] acyclovir (ZOVIRAX) 400 MG tablet Take 1 tablet (400 mg total) by mouth 2 (two) times daily. (Patient not taking: Reported on 12/02/2015)  . [DISCONTINUED] alendronate (FOSAMAX) 70 MG tablet Take 1 tablet (70 mg total) by mouth every 7 (seven) days. Take with a full glass of water on an empty stomach. (Patient not taking: Reported on 12/02/2015)  . [DISCONTINUED] Calcium Carbonate-Vit D-Min 1200-1000 MG-UNIT CHEW Chew 1 tablet by mouth daily. (Patient not taking: Reported on 12/02/2015)  . [DISCONTINUED] Cholecalciferol (VITAMIN D3) 1000 UNITS CAPS Take 1 capsule (1,000 Units total) by mouth daily. (Patient not taking: Reported on 12/02/2015)  . [DISCONTINUED] cyanocobalamin 100 MCG tablet Take 100 mcg by mouth daily. Reported on 09/15/2015  . [DISCONTINUED] LORazepam (ATIVAN)  0.5 MG tablet Take 1 tablet (0.5 mg total) by mouth every 6 (six) hours as needed (nausea). Or under the tongue. Will make drowsy. (Patient not taking: Reported on 12/02/2015)  . [DISCONTINUED] ondansetron (ZOFRAN) 8 MG tablet Take 1 tablet (8 mg total) by mouth every 8 (eight) hours as needed for nausea or vomiting. Will not make drowsy (Patient not taking: Reported on 12/02/2015)  . [DISCONTINUED] oxyCODONE (OXY IR/ROXICODONE) 5 MG immediate release tablet Take 1 tablet (5 mg total) by mouth every 6 (six) hours as needed for moderate pain.  . [DISCONTINUED] prochlorperazine (COMPAZINE) 10 MG tablet Take 1 tablet (10 mg total) by mouth every 6 (six) hours as needed for nausea or vomiting. (Patient not taking: Reported on 12/02/2015)  . [DISCONTINUED] temazepam (RESTORIL) 15 MG  capsule Take 1 capsule (15 mg total) by mouth at bedtime as needed for sleep.  . [DISCONTINUED] valACYclovir (VALTREX) 1000 MG tablet Take 1 tablet (1,000 mg total) by mouth daily. Change to prophylactic dose after initial high dose. (Patient not taking: Reported on 12/02/2015)   Facility-Administered Encounter Medications as of 12/06/2015  Medication  . heparin lock flush 100 unit/mL  . sodium chloride flush (NS) 0.9 % injection 10 mL    Activities of Daily Living In your present state of health, do you have any difficulty performing the following activities: 12/06/2015 07/18/2015  Hearing? N N  Vision? N N  Difficulty concentrating or making decisions? N N  Walking or climbing stairs? N N  Dressing or bathing? N N  Doing errands, shopping? N N  Preparing Food and eating ? N N  Using the Toilet? N N  In the past six months, have you accidently leaked urine? Y N  Do you have problems with loss of bowel control? N N  Managing your Medications? N N  Managing your Finances? N N  Housekeeping or managing your Housekeeping? N N  Some recent data might be hidden    Patient Care Team: [name], MD as PCP - General (Family Medicine) Gordy Levan, MD as Consulting Physician (Oncology) Carmel Sacramento, OD as Consulting Physician (Optometry)    Assessment:     Hearing Screening   125Hz  250Hz  500Hz  1000Hz  2000Hz  3000Hz  4000Hz  6000Hz  8000Hz   Right ear:   0 0 40  40    Left ear:   0 0 0  0      Visual Acuity Screening   Right eye Left eye Both eyes  Without correction: 20/25 20/40-1 20/25  With correction:     Comments: Last vision exam on June 15, 2014 with Dr. Jabier Mutton   Exercise Activities and Dietary recommendations Current Exercise Habits: The patient does not participate in regular exercise at present, Exercise limited by: neurologic condition(s) (neuropathy in feet)  Goals    . Increase water intake          Starting 12/06/2015, I will continue to drink at least 6  glasses of water.       Fall Risk Fall Risk  12/06/2015 06/24/2015 07/06/2014  Falls in the past year? No No No  Risk for fall due to : - Medication side effect -   Depression Screen PHQ 2/9 Scores 12/06/2015 06/24/2015 07/06/2014  PHQ - 2 Score 0 0 0     Cognitive Testing MMSE - Mini Mental State Exam 12/06/2015  Orientation to time 5  Orientation to Place 5  Registration 3  Attention/ Calculation 0  Recall 3  Language- name 2 objects 0  Language- repeat 1  Language- follow 3 step command 3  Language- read & follow direction 0  Write a sentence 0  Copy design 0  Total score 20   PLEASE NOTE: A Mini-Cog screen was completed. Maximum score is 20. A value of 0 denotes this part of [place] MMSE was not completed or the patient failed this part of the Mini-Cog screening.   Mini-Cog Screening Orientation to Time - Max 5 pts Orientation to Place - Max 5 pts Registration - Max 3 pts Recall - Max 3 pts Language Repeat - Max 1 pts Language Follow 3 Step Command - Max 3 pts  Immunization History  Administered Date(s) Administered  . Influenza,inj,Quad PF,36+ Mos 11/20/2012, 05/06/2015, 12/06/2015  . Pneumococcal Conjugate-13 07/06/2014  . Pneumococcal Polysaccharide-23 12/06/2015  . Td 12/10/2011   Screening Tests Health Maintenance  Topic Date Due  . MAMMOGRAM  03/04/2016 (Originally 05/27/2015)  . COLON CANCER SCREENING ANNUAL FOBT  03/04/2016 (Originally 07/30/2015)  . COLONOSCOPY  03/04/2016 (Originally 11/05/1995)  . DTaP/Tdap/Td (1 - Tdap) 12/09/2021 (Originally 12/11/2011)  . TETANUS/TDAP  12/09/2021  . INFLUENZA VACCINE  Completed  . DEXA SCAN  Completed  . Hepatitis C Screening  Completed  . PNA vac Low Risk Adult  Completed      Plan:     I have personally reviewed and addressed the Medicare Annual Wellness questionnaire and have noted the following in the patient's chart:  A. Medical and social history B. Use of alcohol, tobacco or illicit drugs  C. Current  medications and supplements D. Functional ability and status E.  Nutritional status F.  Physical activity G. Advance directives H. List of other physicians I.  Hospitalizations, surgeries, and ER visits in previous 12 months J.  [place] to include hearing, vision, cognitive, depression L. Referrals and appointments - none  In addition, I have reviewed and discussed with patient certain preventive protocols, quality metrics, and best practice recommendations. A written personalized care plan for preventive services as well as general preventive health recommendations were provided to patient.  See attached scanned questionnaire for additional information.   Signed,   Lindell Noe, MHA, BS, LPN Health Advisor

## 2015-12-06 NOTE — Patient Instructions (Addendum)
Katherine Boone , Thank you for taking time to come for your Medicare Wellness Visit. I appreciate your ongoing commitment to your health goals. Please review the following plan we discussed and let me know if I can assist you in the future.   These are the goals we discussed: Goals    . Increase water intake          Starting 12/06/2015, I will continue to drink at least 6 glasses of water.        This is a list of the screening recommended for you and due dates:  Health Maintenance  Topic Date Due  . Mammogram  03/04/2016*  . Stool Blood Test  03/04/2016*  . Colon Cancer Screening  03/04/2016*  . DTaP/Tdap/Td vaccine (1 - Tdap) 12/09/2021*  . Shingles Vaccine  12/05/2025*  . Tetanus Vaccine  12/09/2021  . Flu Shot  Completed  . DEXA scan (bone density measurement)  Completed  .  Hepatitis C: One time screening is recommended by Center for Disease Control  (CDC) for  adults born from [birth year] through 1965.   Completed  . Pneumonia vaccines  Completed  *Topic was postponed. The date shown is not the original due date.     Preventive Care for Adults  A healthy lifestyle and preventive care can promote health and wellness. Preventive health guidelines for adults include the following key practices.  . A routine yearly physical is a good way to check with your health care provider about your health and preventive screening. It is a chance to share any concerns and updates on your health and to receive a thorough exam.  . Visit your dentist for a routine exam and preventive care every 6 months. Brush your teeth twice a day and floss once a day. Good oral hygiene prevents tooth decay and gum disease.  . The frequency of eye exams is based on your age, health, family medical history, use  of contact lenses, and other factors. Follow your health care provider's ecommendations for frequency of eye exams.  . Eat a healthy diet. Foods like vegetables, fruits, whole grains, low-fat dairy  products, and lean protein foods contain the nutrients you need without too many calories. Decrease your intake of foods high in solid fats, added sugars, and salt. Eat the right amount of calories for you. Get information about a proper diet from your health care provider, if necessary.  . Regular physical exercise is one of the most important things you can do for your health. Most adults should get at least 150 minutes of moderate-intensity exercise (any activity that increases your heart rate and causes you to sweat) each week. In addition, most adults need muscle-strengthening exercises on 2 or more days a week.  Silver Sneakers may be a benefit available to you. To determine eligibility, you may visit the website: www.silversneakers.com or contact program at 863-039-6396 Mon-Fri between 8AM-8PM.   . Maintain a healthy weight. The body mass index (BMI) is a screening tool to identify possible weight problems. It provides an estimate of body fat based on height and weight. Your health care provider can find your BMI and can help you achieve or maintain a healthy weight.   For adults 20 years and older: ? A BMI below 18.5 is considered underweight. ? A BMI of 18.5 to 24.9 is normal. ? A BMI of 25 to 29.9 is considered overweight. ? A BMI of 30 and above is considered obese.   . Maintain normal blood  lipids and cholesterol levels by exercising and minimizing your intake of saturated fat. Eat a balanced diet with plenty of fruit and vegetables. Blood tests for lipids and cholesterol should begin at age 55 and be repeated every 5 years. If your lipid or cholesterol levels are high, you are over 50, or you are at high risk for heart disease, you may need your cholesterol levels checked more frequently. Ongoing high lipid and cholesterol levels should be treated with medicines if diet and exercise are not working.  . If you smoke, find out from your health care provider how to quit. If you do not  use tobacco, please do not start.  . If you choose to drink alcohol, please do not consume more than 2 drinks per day. One drink is considered to be 12 ounces (355 mL) of beer, 5 ounces (148 mL) of wine, or 1.5 ounces (44 mL) of liquor.  . If you are 85-73 years old, ask your health care provider if you should take aspirin to prevent strokes.  . Use sunscreen. Apply sunscreen liberally and repeatedly throughout the day. You should seek shade when your shadow is shorter than you. Protect yourself by wearing long sleeves, pants, a wide-brimmed hat, and sunglasses year round, whenever you are outdoors.  . Once a month, do a whole body skin exam, using a mirror to look at the skin on your back. Tell your health care provider of new moles, moles that have irregular borders, moles that are larger than a pencil eraser, or moles that have changed in shape or color.

## 2015-12-07 LAB — HEPATITIS C ANTIBODY: HCV Ab: NEGATIVE

## 2015-12-09 ENCOUNTER — Telehealth: Payer: Self-pay | Admitting: Gynecologic Oncology

## 2015-12-09 ENCOUNTER — Other Ambulatory Visit: Payer: Self-pay | Admitting: Gynecologic Oncology

## 2015-12-09 DIAGNOSIS — C539 Malignant neoplasm of cervix uteri, unspecified: Secondary | ICD-10-CM

## 2015-12-09 NOTE — Telephone Encounter (Signed)
Attempted to call patient and inform her that we would need to move her lab and flush appt around so that she has recent kidney function labs before having a CT scan.  Left message asking her to call the office.

## 2015-12-12 ENCOUNTER — Other Ambulatory Visit (HOSPITAL_BASED_OUTPATIENT_CLINIC_OR_DEPARTMENT_OTHER): Payer: Commercial Managed Care - HMO

## 2015-12-12 ENCOUNTER — Ambulatory Visit (HOSPITAL_BASED_OUTPATIENT_CLINIC_OR_DEPARTMENT_OTHER): Payer: Commercial Managed Care - HMO

## 2015-12-12 DIAGNOSIS — C7989 Secondary malignant neoplasm of other specified sites: Secondary | ICD-10-CM

## 2015-12-12 DIAGNOSIS — C7801 Secondary malignant neoplasm of right lung: Secondary | ICD-10-CM | POA: Diagnosis not present

## 2015-12-12 DIAGNOSIS — C7802 Secondary malignant neoplasm of left lung: Secondary | ICD-10-CM

## 2015-12-12 DIAGNOSIS — C801 Malignant (primary) neoplasm, unspecified: Secondary | ICD-10-CM

## 2015-12-12 DIAGNOSIS — C787 Secondary malignant neoplasm of liver and intrahepatic bile duct: Secondary | ICD-10-CM

## 2015-12-12 DIAGNOSIS — C799 Secondary malignant neoplasm of unspecified site: Secondary | ICD-10-CM

## 2015-12-12 DIAGNOSIS — C539 Malignant neoplasm of cervix uteri, unspecified: Secondary | ICD-10-CM

## 2015-12-12 LAB — COMPREHENSIVE METABOLIC PANEL
ALT: 10 U/L (ref 0–55)
AST: 15 U/L (ref 5–34)
Albumin: 3.7 g/dL (ref 3.5–5.0)
Alkaline Phosphatase: 105 U/L (ref 40–150)
Anion Gap: 8 mEq/L (ref 3–11)
BUN: 17.3 mg/dL (ref 7.0–26.0)
CHLORIDE: 109 meq/L (ref 98–109)
CO2: 23 mEq/L (ref 22–29)
Calcium: 9 mg/dL (ref 8.4–10.4)
Creatinine: 1 mg/dL (ref 0.6–1.1)
EGFR: 54 mL/min/{1.73_m2} — AB (ref >90)
GLUCOSE: 107 mg/dL (ref 70–140)
POTASSIUM: 4.2 meq/L (ref 3.5–5.1)
SODIUM: 140 meq/L (ref 136–145)
Total Bilirubin: 0.39 mg/dL (ref 0.20–1.20)
Total Protein: 6.9 g/dL (ref 6.4–8.3)

## 2015-12-12 LAB — CBC WITH DIFFERENTIAL/PLATELET
BASO%: 0.4 % (ref 0.0–2.0)
BASOS ABS: 0 10*3/uL (ref 0.0–0.1)
EOS%: 2.3 % (ref 0.0–7.0)
Eosinophils Absolute: 0.1 10*3/uL (ref 0.0–0.5)
HCT: 33.3 % — ABNORMAL LOW (ref 34.8–46.6)
HGB: 11.3 g/dL — ABNORMAL LOW (ref 11.6–15.9)
LYMPH%: 31 % (ref 14.0–49.7)
MCH: 30.6 pg (ref 25.1–34.0)
MCHC: 33.9 g/dL (ref 31.5–36.0)
MCV: 90.2 fL (ref 79.5–101.0)
MONO#: 0.5 10*3/uL (ref 0.1–0.9)
MONO%: 8.8 % (ref 0.0–14.0)
NEUT#: 3.3 10*3/uL (ref 1.5–6.5)
NEUT%: 57.5 % (ref 38.4–76.8)
Platelets: 136 10*3/uL — ABNORMAL LOW (ref 145–400)
RBC: 3.69 10*6/uL — ABNORMAL LOW (ref 3.70–5.45)
RDW: 13.3 % (ref 11.2–14.5)
WBC: 5.7 10*3/uL (ref 3.9–10.3)
lymph#: 1.8 10*3/uL (ref 0.9–3.3)

## 2015-12-12 LAB — MAGNESIUM: Magnesium: 1.6 mg/dl (ref 1.5–2.5)

## 2015-12-12 MED ORDER — HEPARIN SOD (PORK) LOCK FLUSH 100 UNIT/ML IV SOLN
500.0000 [IU] | INTRAVENOUS | Status: AC | PRN
  Administered 2015-12-12: 500 [IU]
  Filled 2015-12-12: qty 5

## 2015-12-12 MED ORDER — SODIUM CHLORIDE 0.9 % IJ SOLN
10.0000 mL | INTRAMUSCULAR | Status: AC | PRN
  Administered 2015-12-12: 10 mL
  Filled 2015-12-12: qty 10

## 2015-12-12 NOTE — Progress Notes (Signed)
I reviewed health advisor's note, was available for consultation, and agree with documentation and plan.  

## 2015-12-14 ENCOUNTER — Other Ambulatory Visit: Payer: Self-pay | Admitting: Oncology

## 2015-12-14 ENCOUNTER — Ambulatory Visit (HOSPITAL_COMMUNITY)
Admission: RE | Admit: 2015-12-14 | Discharge: 2015-12-14 | Disposition: A | Discharge status: Home / Self Care | Payer: Commercial Managed Care - HMO | Source: Ambulatory Visit | Attending: Gynecologic Oncology | Admitting: Gynecologic Oncology

## 2015-12-14 DIAGNOSIS — K769 Liver disease, unspecified: Secondary | ICD-10-CM | POA: Insufficient documentation

## 2015-12-14 DIAGNOSIS — R918 Other nonspecific abnormal finding of lung field: Secondary | ICD-10-CM | POA: Diagnosis not present

## 2015-12-14 DIAGNOSIS — C539 Malignant neoplasm of cervix uteri, unspecified: Secondary | ICD-10-CM | POA: Insufficient documentation

## 2015-12-14 MED ORDER — IOPAMIDOL (ISOVUE-300) INJECTION 61%
100.0000 mL | Freq: Once | INTRAVENOUS | Status: AC | PRN
  Administered 2015-12-14: 100 mL via INTRAVENOUS

## 2015-12-15 ENCOUNTER — Ambulatory Visit (HOSPITAL_BASED_OUTPATIENT_CLINIC_OR_DEPARTMENT_OTHER): Payer: Commercial Managed Care - HMO | Admitting: Oncology

## 2015-12-15 ENCOUNTER — Other Ambulatory Visit: Payer: Commercial Managed Care - HMO

## 2015-12-15 ENCOUNTER — Encounter: Payer: Self-pay | Admitting: Oncology

## 2015-12-15 VITALS — BP 101/65 | HR 82 | Temp 98.3°F | Resp 18 | Ht 64.5 in | Wt 161.5 lb

## 2015-12-15 DIAGNOSIS — T451X5A Adverse effect of antineoplastic and immunosuppressive drugs, initial encounter: Secondary | ICD-10-CM

## 2015-12-15 DIAGNOSIS — C7801 Secondary malignant neoplasm of right lung: Secondary | ICD-10-CM | POA: Diagnosis not present

## 2015-12-15 DIAGNOSIS — C787 Secondary malignant neoplasm of liver and intrahepatic bile duct: Secondary | ICD-10-CM

## 2015-12-15 DIAGNOSIS — C539 Malignant neoplasm of cervix uteri, unspecified: Secondary | ICD-10-CM

## 2015-12-15 DIAGNOSIS — C7802 Secondary malignant neoplasm of left lung: Secondary | ICD-10-CM

## 2015-12-15 DIAGNOSIS — C7989 Secondary malignant neoplasm of other specified sites: Secondary | ICD-10-CM

## 2015-12-15 DIAGNOSIS — Z933 Colostomy status: Secondary | ICD-10-CM

## 2015-12-15 DIAGNOSIS — G620 Drug-induced polyneuropathy: Secondary | ICD-10-CM

## 2015-12-15 DIAGNOSIS — D701 Agranulocytosis secondary to cancer chemotherapy: Secondary | ICD-10-CM

## 2015-12-15 DIAGNOSIS — C801 Malignant (primary) neoplasm, unspecified: Secondary | ICD-10-CM

## 2015-12-15 DIAGNOSIS — B009 Herpesviral infection, unspecified: Secondary | ICD-10-CM

## 2015-12-15 NOTE — Progress Notes (Signed)
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 She continues to respond clinically and by CT CAP 12-14-15 to CDDP taxol avastin, cycle 5 given 10-21-15.  She saw Dr Josephina Shih on 12-02-15; I will let him know results of the CT and our plans to continue treatment. She is not a surgical candidate, which she understands.  She is to see PCP Dr Danise Mina on 12-16-15.  Patient has significant fatigue for 1-2 weeks after chemo, but is feeling very well now. Taxol / on pro neulasta aches have same pattern; she noticed more neuropathy in feet since most recent treatment, none significant in hands. She does not have difficulty walking. She tolerates IV benadryl poorly including excessive sedation even with reduced dose, will change to po with subsequent treatments. Appetite is excellent, bowels moving well via colostomy. No abdominal or pelvic pain. No bleeding. No SOB or any significant cough, no chest pain. No problems with PAC. No LE swelling.  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  Remainder of 10 point Review of Systems negative.   PAC functioning without difficulty Flu vaccine 12-06-15  ONCOLOGIC HISTORY Patient was in her usual very good health until new cramping abdominal pain intermittently for last few months. She had some constipation with this, particularly severe when she saw PCP for this reason on 04-26-15. She has had no vaginal bleeding. She had no bowel obstruction by xray and bowels moved with miralax. CT AP 04-26-15 had 10.3 x 13.8 mixed cystic and solid mass involving uterus and extending posteriorly into cul de sac and into bilateral adnexal regions, sigmoid colon tethered to mass, no ascites, scattered necrotic lymph nodes in  mesentery and omentum, no hydroureter, 3 lesions in liver up to 3.4 cm, and numerous bilateral pulmonary nodules in lung bases. CEA was 1.1 and CA 125 was 105; CBC and CMET 04-26-15 were entirely normal. 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 She had CT biopsy of pelvic mass in region of left psoas on 3-1-1 7. Pathology [phone], showed high grade poorly differentiated carcinoma with immunohistochemical stains suggesting cervical primary, which did not fit clinical picture well. CT chest 05-10-15 showed multiple bilateral pulmonary nodules (>40), 3 liver lesions up to 3.7 cm, left supraclavicular node and left hilar nodes. Chemotherapy was begun with first carboplatin and taxol on 05-12-15. Admitted with colonic obstruction 05-15-15, diverting sigmoid loop colostomy 05-18-15. Neutropenic 05-19-15 (day 8 cycle 1) despite granix begun day 5. Chemo changed to CDDP taxol with review of the pathology information, cycle 1 delayed due to inadequate IV access on 06-09-15. She had first CDDP taxol on 06-23-15 with on pro neulasta, no documented neutropenia and only mild thrombocytopenia (120k). Avastin was added to CDDP taxol with treatment 07-14-15. CT CAP 12-14-15 compared with 09-2015 improved, with multiple bilateral pulmonary nodules stable in size to decreased, liver lesions are stable to decreased, area encompassing the uterus and adnexal region decreased in size, no new or progressive disease .   Objective:  Vital signs in last 24 hours:  BP 101/65 (BP Location: Left Arm, Patient Position: Sitting)   Pulse 82   Temp 98.3 F (36.8 C) (Oral)   Resp 18   Ht 5' 4.5" (1.638 m)   Wt 161 lb  8 oz (73.3 kg)   SpO2 100%   BMI 27.29 kg/m  Weight down 3 lbs Alert, oriented and appropriate. Ambulatory without difficulty, easily able to get on and off exam table. Looks comfortable, very pleasant as always  Alopecia  HEENT:PERRL, sclerae not icteric. Oral  mucosa moist without lesions, posterior pharynx clear.  Neck supple. No JVD.  Lymphatics:no cervical,supraclavicular or inguinal adenopathy Resp: clear to auscultation bilaterally and normal percussion bilaterally Cardio: regular rate and rhythm. No gallop. GI: soft, nontender, not distended, no mass or organomegaly. Normally active bowel sounds. Soft stool in ostomy bag Musculoskeletal/ Extremities: LE without pitting edema, cords, tenderness Neuro: mild peripheral neuropathy feet. Otherwise nonfocal. PSYCH appropriate mood and affect Skin without rash, ecchymosis, petechiae Portacath-without erythema or tenderness  Lab Results:  Results for orders placed or performed in visit on 12/12/15  Magnesium  Result Value Ref Range   Magnesium 1.6 1.5 - 2.5 mg/dl  CBC with Differential  Result Value Ref Range   WBC 5.7 3.9 - 10.3 10e3/uL   NEUT# 3.3 1.5 - 6.5 10e3/uL   HGB 11.3 (L) 11.6 - 15.9 g/dL   HCT 33.3 (L) 34.8 - 46.6 %   Platelets 136 (L) 145 - 400 10e3/uL   MCV 90.2 79.5 - 101.0 fL   MCH 30.6 25.1 - 34.0 pg   MCHC 33.9 31.5 - 36.0 g/dL   RBC 3.69 (L) 3.70 - 5.45 10e6/uL   RDW 13.3 11.2 - 14.5 %   lymph# 1.8 0.9 - 3.3 10e3/uL   MONO# 0.5 0.1 - 0.9 10e3/uL   Eosinophils Absolute 0.1 0.0 - 0.5 10e3/uL   Basophils Absolute 0.0 0.0 - 0.1 10e3/uL   NEUT% 57.5 38.4 - 76.8 %   LYMPH% 31.0 14.0 - 49.7 %   MONO% 8.8 0.0 - 14.0 %   EOS% 2.3 0.0 - 7.0 %   BASO% 0.4 0.0 - 2.0 %  Comprehensive metabolic panel  Result Value Ref Range   Sodium 140 136 - 145 mEq/L   Potassium 4.2 3.5 - 5.1 mEq/L   Chloride 109 98 - 109 mEq/L   CO2 23 22 - 29 mEq/L   Glucose 107 70 - 140 mg/dl   BUN 17.3 7.0 - 26.0 mg/dL   Creatinine 1.0 0.6 - 1.1 mg/dL   Total Bilirubin 0.39 0.20 - 1.20 mg/dL   Alkaline Phosphatase 105 40 - 150 U/L   AST 15 5 - 34 U/L   ALT 10 0 - 55 U/L   Total Protein 6.9 6.4 - 8.3 g/dL   Albumin 3.7 3.5 - 5.0 g/dL   Calcium 9.0 8.4 - 10.4 mg/dL   Anion Gap 8 3 - 11 mEq/L    EGFR 54 (L) >90 ml/min/1.73 m2     Studies/Results:  Ct Chest W Contrast  Result Date: 12/14/2015 CLINICAL DATA:  Malignant neoplasm of cervix. EXAM: CT CHEST, ABDOMEN, AND PELVIS WITH CONTRAST TECHNIQUE: Multidetector CT imaging of the chest, abdomen and pelvis was performed following the standard protocol during bolus administration of intravenous contrast. CONTRAST:  172m ISOVUE-300 IOPAMIDOL (ISOVUE-300) INJECTION 61% COMPARISON:  09/13/2015. FINDINGS: CT CHEST FINDINGS Cardiovascular: The heart size is normal. No pericardial effusion. No thoracic aortic aneurysm. Small nodule posterior right thyroid lobe is stable. Right Port-A-Cath tip is positioned at the SVC/ RA junction. Mediastinum/Nodes: 6 mm short axis prevascular lymph node is stable. As before, there is no mediastinal or hilar lymphadenopathy. The esophagus has normal imaging features. There is no axillary lymphadenopathy. Lungs/Pleura: Multiple pulmonary nodules again  noted. 4 mm index right upper lobe nodule (image 43 series 7) is unchanged at 4 mm. Index right lower lobe pulmonary nodule measured previously at 5 mm is now 4 mm (image 101). Left lower lobe index nodule measured previously at 7 mm is now 6 mm (image 96). Other similar scattered bilateral pulmonary nodules are also stable in the interval. No new or progressive pulmonary nodule on today's study. No focal airspace consolidation. No pulmonary edema or pleural effusion. Musculoskeletal: Bone windows reveal no worrisome lytic or sclerotic osseous lesions. CT ABDOMEN PELVIS FINDINGS Hepatobiliary: Segment IVA left liver lesion measured previously 1.2 x 0.9 cm now measures 1.2 x 0.9 cm (image 52 series 2). Subcapsular segment V lesion measured previously at 5 x 5 mm is no longer evident. 6 mm low-density lesion inferior right liver is unchanged, likely a cyst. Liver Parenchyma otherwise unremarkable. There is no evidence for gallstones, gallbladder wall thickening, or pericholecystic  fluid. No intrahepatic or extrahepatic biliary dilation. Pancreas: No focal mass lesion. No dilatation of the main duct. No intraparenchymal cyst. No peripancreatic edema. Spleen: No splenomegaly. No focal mass lesion. Adrenals/Urinary Tract: No adrenal nodule or mass. Right kidney unremarkable. 13 mm low-density lesion lower pole left kidney is stable and likely a cyst. No evidence for hydroureter. The urinary bladder appears normal for the degree of distention. Stomach/Bowel: Stomach is nondistended. No gastric wall thickening. No evidence of outlet obstruction. Duodenum is normally positioned as is the ligament of Treitz. No small bowel wall thickening. No small bowel dilatation. The terminal ileum is normal. The appendix is not visualized, but there is no edema or inflammation in the region of the cecum. Left lower quadrant sigmoid loop colostomy is again noted. Vascular/Lymphatic: No abdominal aortic aneurysm. No abdominal aortic atherosclerotic calcification. There is no gastrohepatic or hepatoduodenal ligament lymphadenopathy. No intraperitoneal or retroperitoneal lymphadenopathy. No pelvic sidewall lymphadenopathy. Reproductive: The mixed attenuation lesion encompassing the uterus and both adnexal regions shows continued further decrease in size, measuring 9.7 x 5.9 cm today compared to 11.0 x 6.8 cm previously. Other: No intraperitoneal free fluid. Musculoskeletal: Bone windows reveal no worrisome lytic or sclerotic osseous lesions. Mild T12 compression deformity is stable. IMPRESSION: 1. Stable to improved interval exam. Multiple bilateral pulmonary nodules are stable in size to decreased and the liver lesions are stable to decreased. Mixed attenuation lesion encompassing the uterus and adnexal region shows decrease in size. There is no evidence for new or progressive disease on today's exam. Electronically Signed   By: Misty Stanley M.D.   On: 12/14/2015 12:55   Ct Abdomen Pelvis W Contrast  Result  Date: 12/14/2015 CLINICAL DATA:  Malignant neoplasm of cervix. EXAM: CT CHEST, ABDOMEN, AND PELVIS WITH CONTRAST TECHNIQUE: Multidetector CT imaging of the chest, abdomen and pelvis was performed following the standard protocol during bolus administration of intravenous contrast. CONTRAST:  153m ISOVUE-300 IOPAMIDOL (ISOVUE-300) INJECTION 61% COMPARISON:  09/13/2015. FINDINGS: CT CHEST FINDINGS Cardiovascular: The heart size is normal. No pericardial effusion. No thoracic aortic aneurysm. Small nodule posterior right thyroid lobe is stable. Right Port-A-Cath tip is positioned at the SVC/ RA junction. Mediastinum/Nodes: 6 mm short axis prevascular lymph node is stable. As before, there is no mediastinal or hilar lymphadenopathy. The esophagus has normal imaging features. There is no axillary lymphadenopathy. Lungs/Pleura: Multiple pulmonary nodules again noted. 4 mm index right upper lobe nodule (image 43 series 7) is unchanged at 4 mm. Index right lower lobe pulmonary nodule measured previously at 5 mm is now 4 mm (  image 101). Left lower lobe index nodule measured previously at 7 mm is now 6 mm (image 96). Other similar scattered bilateral pulmonary nodules are also stable in the interval. No new or progressive pulmonary nodule on today's study. No focal airspace consolidation. No pulmonary edema or pleural effusion. Musculoskeletal: Bone windows reveal no worrisome lytic or sclerotic osseous lesions. CT ABDOMEN PELVIS FINDINGS Hepatobiliary: Segment IVA left liver lesion measured previously 1.2 x 0.9 cm now measures 1.2 x 0.9 cm (image 52 series 2). Subcapsular segment V lesion measured previously at 5 x 5 mm is no longer evident. 6 mm low-density lesion inferior right liver is unchanged, likely a cyst. Liver Parenchyma otherwise unremarkable. There is no evidence for gallstones, gallbladder wall thickening, or pericholecystic fluid. No intrahepatic or extrahepatic biliary dilation. Pancreas: No focal mass  lesion. No dilatation of the main duct. No intraparenchymal cyst. No peripancreatic edema. Spleen: No splenomegaly. No focal mass lesion. Adrenals/Urinary Tract: No adrenal nodule or mass. Right kidney unremarkable. 13 mm low-density lesion lower pole left kidney is stable and likely a cyst. No evidence for hydroureter. The urinary bladder appears normal for the degree of distention. Stomach/Bowel: Stomach is nondistended. No gastric wall thickening. No evidence of outlet obstruction. Duodenum is normally positioned as is the ligament of Treitz. No small bowel wall thickening. No small bowel dilatation. The terminal ileum is normal. The appendix is not visualized, but there is no edema or inflammation in the region of the cecum. Left lower quadrant sigmoid loop colostomy is again noted. Vascular/Lymphatic: No abdominal aortic aneurysm. No abdominal aortic atherosclerotic calcification. There is no gastrohepatic or hepatoduodenal ligament lymphadenopathy. No intraperitoneal or retroperitoneal lymphadenopathy. No pelvic sidewall lymphadenopathy. Reproductive: The mixed attenuation lesion encompassing the uterus and both adnexal regions shows continued further decrease in size, measuring 9.7 x 5.9 cm today compared to 11.0 x 6.8 cm previously. Other: No intraperitoneal free fluid. Musculoskeletal: Bone windows reveal no worrisome lytic or sclerotic osseous lesions. Mild T12 compression deformity is stable. IMPRESSION: 1. Stable to improved interval exam. Multiple bilateral pulmonary nodules are stable in size to decreased and the liver lesions are stable to decreased. Mixed attenuation lesion encompassing the uterus and adnexal region shows decrease in size. There is no evidence for new or progressive disease on today's exam. Electronically Signed   By: Misty Stanley M.D.   On: 12/14/2015 12:55    PACs images reviewed with patient at visit.  Medications: I have reviewed the patient's current medications. Premed  benadryl changed to 12.5 mg PO due to excessive sedation and restlessness with IV Taxol dose decreased from 135 mg/m2 to 100 mg/m2 due to neuropathy Flu and pneumonia vaccines done 12-06-15 She is off of a number of her supplements, also off of calcium and oral bisphosphonate. I have told her that it is fine from my standpoint for her to be on less medication, also that recommendations now are more to avoid oral calcium supplements. The oral bisphosphonate can aggravate chemo nausea if any GI irritation, so also OK with me to hold that.   DISCUSSION CT information reviewed Patient is in agreement with continuing treatment with adjustments in benadryl and in taxol. She understands that aim of chemo is to improve and control disease, which is not curable either by surgery or by systemic treatment, but still is responding well to present interventions, even with flexibility in timing of treatments to allow her to go about desired activities.   Assessment/Plan:  1. high grade poorly differentiated gyn carcinoma  with pathology immunostains consistent with cervical or endocervical primary, metastatic in pelvis and to lungs and liver. Treatment is in attempt to shrink and control disease as best possible, not in curative attempt. She has been reluctant to have CDDP taxol avastin every 3 weeks due to fatigue and other side effects, but is still responding to treatment as has been given. Aloxi and emend with other antiemetics, IVF (+ IV antiemetics) after rx if unable to do oral hydration adequately. Neulasta by on pro has supported counts well tho adding to aches. Cycle 5 CDDP taxol avastin given 10-20-15. CT CAP 12-14-15 shows stable to improved areas of metastatic disease including lungs and pelvis, no new involvement. She is feeling very well other than 1-2 weeks after each treatment. She agrees to continue, next treatment ~ 10-26 then late Nov, which will allow her to feel well for holidays.  2.post  diverting sigmoid loop colostomy 05-18-15 for distal bowel obstruction from pelvic malignancy.  Ostomy functioning well .  3.PAC in  4.chemo neutropenia with cycle 1 carbo taxol: on pro neulasta  5.nutritional status now excellent. 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 RN to speak with her by phone about this medication as we did not address during visit 7.flu vaccine done 12-06-15 8. No advance directives: continue to address. She does seem to have better understanding of extent of disease and goal of treatment; we are adjusting schedule at this office to allow her beach trips etc. 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  9. Hypomagnesemia related to CDDP: K fine, magnesium improved with oral + IV supplements, continue 10.chemo peripheral neuropathy: Taxol and CDDP. More noticeable in feet. Will decrease taxol to 100 mg/m2 which may also help aches. She understands that neuropathy may become limiting factor for continuing treatment but agrees to this plan for now given good improvement on this recent CT  All questions answered and patient is in agreement with recommendations and plans. Chemo orders for taxol and benadryl changed, continue avastin and on pro neulasta Time spent 30 min including >50% counseling and coordination of care.  Route PCP   Evlyn Clines, MD   12/15/2015, 9:13 AM

## 2015-12-16 ENCOUNTER — Ambulatory Visit (INDEPENDENT_AMBULATORY_CARE_PROVIDER_SITE_OTHER): Payer: Commercial Managed Care - HMO | Admitting: Family Medicine

## 2015-12-16 ENCOUNTER — Encounter: Payer: Self-pay | Admitting: Family Medicine

## 2015-12-16 VITALS — BP 110/72 | HR 72 | Temp 97.7°F | Wt 163.0 lb

## 2015-12-16 DIAGNOSIS — E785 Hyperlipidemia, unspecified: Secondary | ICD-10-CM

## 2015-12-16 DIAGNOSIS — Z7189 Other specified counseling: Secondary | ICD-10-CM | POA: Diagnosis not present

## 2015-12-16 DIAGNOSIS — C539 Malignant neoplasm of cervix uteri, unspecified: Secondary | ICD-10-CM

## 2015-12-16 DIAGNOSIS — Z0000 Encounter for general adult medical examination without abnormal findings: Secondary | ICD-10-CM | POA: Diagnosis not present

## 2015-12-16 DIAGNOSIS — M810 Age-related osteoporosis without current pathological fracture: Secondary | ICD-10-CM

## 2015-12-16 DIAGNOSIS — C799 Secondary malignant neoplasm of unspecified site: Secondary | ICD-10-CM

## 2015-12-16 DIAGNOSIS — E559 Vitamin D deficiency, unspecified: Secondary | ICD-10-CM

## 2015-12-16 DIAGNOSIS — Z933 Colostomy status: Secondary | ICD-10-CM

## 2015-12-16 NOTE — Assessment & Plan Note (Signed)
Continued

## 2015-12-16 NOTE — Progress Notes (Signed)
Pre visit review using our clinic review tool, if applicable. No additional management support is needed unless otherwise documented below in the visit note. 

## 2015-12-16 NOTE — Patient Instructions (Signed)
Check with Dr Marko Plume about vitamin D3.  Work on [employer]  Good to see you today, call us with questions.  Return as needed or in 1 year for next wellness visit.

## 2015-12-16 NOTE — Assessment & Plan Note (Signed)
Appreciate onc and GYN onc care of patient.

## 2015-12-16 NOTE — Assessment & Plan Note (Addendum)
Significant. Not on treatment. rec check with Dr Marko Plume regarding restarting vitamin D she has at home.

## 2015-12-16 NOTE — Assessment & Plan Note (Deleted)
Continued

## 2015-12-16 NOTE — Assessment & Plan Note (Signed)
Advised check with Dr Marko Plume about restarting vit D3 1000 IU she has at home.

## 2015-12-16 NOTE — Progress Notes (Signed)
BP 110/72   Pulse 72   Temp 97.7 F (36.5 C) (Oral)   Wt 163 lb (73.9 kg)   BMI 27.55 kg/m    CC: CPE Subjective:    Patient ID: Katherine Boone, female    DOB: 12/21/1945, 70 y.o.   MRN: [id number]  HPI: Katherine Boone is a 70 y.o. female presenting on 12/16/2015 for Annual Exam   Saw Katha Cabal for medicare wellness visit last week, note reviewed. Failed hearing screen. Declines audiology eval.   Unfortunately patient was diagnosed with metastatic GYN cancer of unclear origin ?endocervical vs ovarian adenocarcinoma (high-grade, poorly differentiated). Followed by Dr Marko Plume and Dr Fermin Schwab. Fortunately she had a remarkable response to chemotherapy with significant tumor burden reduction after just 3 cycles. Continues chemotherapy cycles and clse f/u with onc. She enjoys beach trips frequency.   Ongoing anger and irritability since diagnosis but manageable, declines treatment at this time.   Preventative: Colon cancer screening - iFOB normal 12/2010. Declines screening at this time.  Mammogram (diagnostic and bilat [place]) normal 05/2014. Declines screening at this time. Self breast exams at home. Declines clinical exam.  Well woman exam - done May 06, 2010 with normal pap smear.  Osteoporosis Date: 02/2011 DEXA 12/12 - Lspine -2.6; femur -3.8. Not on calcium or vitamin D.  Flu yearly prevnar May 06, 2014, pneumovax May 07, 2015 Td 2011/05/07 Prevnar today [place] - contraindicated at this time. Advanced directives - doesn't want prolonged life support. Has completed at home and will bring [place] copy. Has not decided about who would be HCPOA.  Seat belt use discussed Sunscreen use discussed. 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 Interim medical history since our last visit reviewed. Allergies and medications reviewed and updated. Current Outpatient Prescriptions on File Prior to Visit  Medication Sig  . Magnesium 400 MG CAPS Take 400 mg by mouth 3 (three) times a week. Hold if excessive diarrhea.  Marland Kitchen omeprazole (PRILOSEC) 20 MG capsule Take 1 capsule (20 mg total) by mouth daily. (Patient taking differently: Take 20 mg by mouth daily as needed. )  . dexamethasone (DECADRON) 4 MG tablet Take 5 tablets (38m) with food 12 hours and 6 hours before taxol (Patient not taking: Reported on 12/16/2015)  . lidocaine-prilocaine (EMLA) cream Apply to Portacath  1-2 hrs prior to access. (Patient not taking: Reported on 12/16/2015)   Current Facility-Administered Medications on File Prior to Visit  Medication  . heparin lock flush 100 unit/mL  . sodium chloride flush (NS) 0.9 % injection 10 mL    Review of Systems  Constitutional: Negative for activity change, appetite change, chills, fatigue, fever and unexpected weight change.  HENT: Negative for hearing loss.   Eyes: Negative for visual disturbance.  Respiratory: Positive for cough (dry cough). Negative for chest tightness, shortness of breath and wheezing.   Cardiovascular: Negative for chest pain, palpitations and leg swelling.  Gastrointestinal: Negative for abdominal distention, abdominal pain, blood in stool, constipation, diarrhea, nausea and vomiting.  Genitourinary: Negative for difficulty urinating and hematuria.  Musculoskeletal: Negative for arthralgias, myalgias and neck pain.  Skin: Negative for rash.  Neurological: Positive for numbness (neuropathy from chemo). Negative  for dizziness, seizures, syncope and headaches.  Hematological: Negative for adenopathy. Does not bruise/bleed easily.  Psychiatric/Behavioral: Negative for dysphoric mood. The  patient is not nervous/anxious.    Per HPI unless specifically indicated in ROS section     Objective:    BP 110/72   Pulse 72   Temp 97.7 F (36.5 C) (Oral)   Wt 163 lb (73.9 kg)   BMI 27.55 kg/m   Wt Readings from Last 3 Encounters:  12/16/15 163 lb (73.9 kg)  12/15/15 161 lb 8 oz (73.3 kg)  12/06/15 163 lb 8 oz (74.2 kg)    Physical Exam  Constitutional: She is oriented to person, place, and time. She appears well-developed and well-nourished. No distress.  HENT:  Head: Normocephalic and atraumatic.  Right Ear: Hearing, tympanic membrane, external ear and ear canal normal.  Left Ear: Hearing, tympanic membrane, external ear and ear canal normal.  Nose: Nose normal.  Mouth/Throat: Uvula is midline, oropharynx is clear and moist and mucous membranes are normal. No oropharyngeal exudate, posterior oropharyngeal edema or posterior oropharyngeal erythema.  Eyes: Conjunctivae and EOM are normal. Pupils are equal, round, and reactive to light. No scleral icterus.  Neck: Normal range of motion. Neck supple. Carotid bruit is not present. No thyromegaly present.  Cardiovascular: Normal rate, regular rhythm, normal heart sounds and intact distal pulses.   No murmur heard. Pulses:      Radial pulses are 2+ on the right side, and 2+ on the left side.  Pulmonary/Chest: Effort normal and breath sounds normal. No respiratory distress. She has no wheezes. She has no rales.  Abdominal: Soft. Bowel sounds are normal. She exhibits no distension and no mass. There is no tenderness. There is no rebound and no guarding.  Musculoskeletal: Normal range of motion. She exhibits no edema.  Lymphadenopathy:    She has no cervical adenopathy.  Neurological: She is alert and oriented to person, place, and time.  CN grossly intact, station and gait intact  Skin: Skin is warm and dry. No rash noted.  Psychiatric: She has a normal mood and affect. Her behavior is normal. Judgment and thought content  normal.  Nursing note and vitals reviewed.  Results for orders placed or performed in visit on 12/12/15  Magnesium  Result Value Ref Range   Magnesium 1.6 1.5 - 2.5 mg/dl  CBC with Differential  Result Value Ref Range   WBC 5.7 3.9 - 10.3 10e3/uL   NEUT# 3.3 1.5 - 6.5 10e3/uL   HGB 11.3 (L) 11.6 - 15.9 g/dL   HCT 33.3 (L) 34.8 - 46.6 %   Platelets 136 (L) 145 - 400 10e3/uL   MCV 90.2 79.5 - 101.0 fL   MCH 30.6 25.1 - 34.0 pg   MCHC 33.9 31.5 - 36.0 g/dL   RBC 3.69 (L) 3.70 - 5.45 10e6/uL   RDW 13.3 11.2 - 14.5 %   lymph# 1.8 0.9 - 3.3 10e3/uL   MONO# 0.5 0.1 - 0.9 10e3/uL   Eosinophils Absolute 0.1 0.0 - 0.5 10e3/uL   Basophils Absolute 0.0 0.0 - 0.1 10e3/uL   NEUT% 57.5 38.4 - 76.8 %   LYMPH% 31.0 14.0 - 49.7 %   MONO% 8.8 0.0 - 14.0 %   EOS% 2.3 0.0 - 7.0 %   BASO% 0.4 0.0 - 2.0 %  Comprehensive metabolic panel  Result Value Ref Range   Sodium 140 136 - 145 mEq/L   Potassium 4.2 3.5 - 5.1 mEq/L   Chloride 109 98 - 109  mEq/L   CO2 23 22 - 29 mEq/L   Glucose 107 70 - 140 mg/dl   BUN 17.3 7.0 - 26.0 mg/dL   Creatinine 1.0 0.6 - 1.1 mg/dL   Total Bilirubin 0.39 0.20 - 1.20 mg/dL   Alkaline Phosphatase 105 40 - 150 U/L   AST 15 5 - 34 U/L   ALT 10 0 - 55 U/L   Total Protein 6.9 6.4 - 8.3 g/dL   Albumin 3.7 3.5 - 5.0 g/dL   Calcium 9.0 8.4 - 10.4 mg/dL   Anion Gap 8 3 - 11 mEq/L   EGFR 54 (L) >90 ml/min/1.73 m2      Assessment & Plan:   Problem List Items Addressed This Visit    Advanced care planning/counseling discussion    Advanced directives - doesn't want prolonged life support. Has completed at home and will bring [place] copy. Has not decided about who would be HCPOA.       Colostomy in place [facility])    Continued.      Health maintenance examination - Primary    Preventative protocols reviewed and updated unless pt declined. Discussed healthy diet and lifestyle.       HLD (hyperlipidemia)    Chronic, mild. No need for treatment at this time. Encouraged  avoiding fried greasy foods.       International Federation of Gynecology and Obstetrics (FIGO) stage IVB malignant neoplasm of cervix ([place])    Appreciate onc and GYN onc care of patient.      Metastatic cancer ([place])    Appreciate onc and GYN onc care of patient.       Osteoporosis    Significant. Not on treatment. rec check with Dr Marko Plume regarding restarting vitamin D she has at home.       Vitamin D deficiency    Advised check with Dr Marko Plume about restarting vit D3 1000 IU she has at home.       Other Visit Diagnoses   None.      Follow up plan: Return in about 1 year (around 12/15/2016) for medicare wellness visit, annual exam, prior fasting for blood work.  [name], MD

## 2015-12-16 NOTE — Assessment & Plan Note (Signed)
Chronic, mild. No need for treatment at this time. Encouraged avoiding fried greasy foods.

## 2015-12-16 NOTE — Assessment & Plan Note (Addendum)
Advanced directives - doesn't want prolonged life support. Has completed at home and will bring [place] copy. Has not decided about who would be HCPOA.

## 2015-12-16 NOTE — Assessment & Plan Note (Signed)
Preventative protocols reviewed and updated unless pt declined. Discussed healthy diet and lifestyle.  

## 2015-12-17 DIAGNOSIS — G620 Drug-induced polyneuropathy: Secondary | ICD-10-CM | POA: Insufficient documentation

## 2015-12-17 DIAGNOSIS — T451X5A Adverse effect of antineoplastic and immunosuppressive drugs, initial encounter: Secondary | ICD-10-CM

## 2015-12-30 ENCOUNTER — Ambulatory Visit (HOSPITAL_BASED_OUTPATIENT_CLINIC_OR_DEPARTMENT_OTHER): Payer: Commercial Managed Care - HMO

## 2015-12-30 ENCOUNTER — Other Ambulatory Visit (HOSPITAL_BASED_OUTPATIENT_CLINIC_OR_DEPARTMENT_OTHER): Payer: Commercial Managed Care - HMO

## 2015-12-30 VITALS — BP 107/68 | HR 57 | Temp 97.9°F | Resp 18

## 2015-12-30 DIAGNOSIS — C787 Secondary malignant neoplasm of liver and intrahepatic bile duct: Secondary | ICD-10-CM

## 2015-12-30 DIAGNOSIS — C799 Secondary malignant neoplasm of unspecified site: Secondary | ICD-10-CM

## 2015-12-30 DIAGNOSIS — Z5112 Encounter for antineoplastic immunotherapy: Secondary | ICD-10-CM | POA: Diagnosis not present

## 2015-12-30 DIAGNOSIS — C7802 Secondary malignant neoplasm of left lung: Secondary | ICD-10-CM

## 2015-12-30 DIAGNOSIS — C801 Malignant (primary) neoplasm, unspecified: Secondary | ICD-10-CM | POA: Diagnosis not present

## 2015-12-30 DIAGNOSIS — C7989 Secondary malignant neoplasm of other specified sites: Secondary | ICD-10-CM | POA: Diagnosis not present

## 2015-12-30 DIAGNOSIS — Z5111 Encounter for antineoplastic chemotherapy: Secondary | ICD-10-CM | POA: Diagnosis not present

## 2015-12-30 DIAGNOSIS — C539 Malignant neoplasm of cervix uteri, unspecified: Secondary | ICD-10-CM

## 2015-12-30 DIAGNOSIS — C7801 Secondary malignant neoplasm of right lung: Secondary | ICD-10-CM

## 2015-12-30 LAB — CBC WITH DIFFERENTIAL/PLATELET
BASO%: 0 % (ref 0.0–2.0)
BASOS ABS: 0 10*3/uL (ref 0.0–0.1)
EOS%: 0 % (ref 0.0–7.0)
Eosinophils Absolute: 0 10*3/uL (ref 0.0–0.5)
HCT: 36.2 % (ref 34.8–46.6)
HEMOGLOBIN: 12.4 g/dL (ref 11.6–15.9)
LYMPH#: 0.5 10*3/uL — AB (ref 0.9–3.3)
LYMPH%: 8.3 % — ABNORMAL LOW (ref 14.0–49.7)
MCH: 30.3 pg (ref 25.1–34.0)
MCHC: 34.3 g/dL (ref 31.5–36.0)
MCV: 88.5 fL (ref 79.5–101.0)
MONO#: 0 10*3/uL — ABNORMAL LOW (ref 0.1–0.9)
MONO%: 0.2 % (ref 0.0–14.0)
NEUT%: 91.5 % — ABNORMAL HIGH (ref 38.4–76.8)
NEUTROS ABS: 5.6 10*3/uL (ref 1.5–6.5)
NRBC: 0 % (ref 0–0)
Platelets: 144 10*3/uL — ABNORMAL LOW (ref 145–400)
RBC: 4.09 10*6/uL (ref 3.70–5.45)
RDW: 12.7 % (ref 11.2–14.5)
WBC: 6.1 10*3/uL (ref 3.9–10.3)

## 2015-12-30 LAB — COMPREHENSIVE METABOLIC PANEL
ALBUMIN: 3.8 g/dL (ref 3.5–5.0)
ALK PHOS: 109 U/L (ref 40–150)
ALT: 14 U/L (ref 0–55)
AST: 17 U/L (ref 5–34)
Anion Gap: 8 mEq/L (ref 3–11)
BILIRUBIN TOTAL: 0.55 mg/dL (ref 0.20–1.20)
BUN: 21.2 mg/dL (ref 7.0–26.0)
CO2: 22 meq/L (ref 22–29)
CREATININE: 1 mg/dL (ref 0.6–1.1)
Calcium: 9.4 mg/dL (ref 8.4–10.4)
Chloride: 108 mEq/L (ref 98–109)
EGFR: 54 mL/min/{1.73_m2} — ABNORMAL LOW (ref >90)
GLUCOSE: 231 mg/dL — AB (ref 70–140)
Potassium: 4 mEq/L (ref 3.5–5.1)
SODIUM: 139 meq/L (ref 136–145)
TOTAL PROTEIN: 7.7 g/dL (ref 6.4–8.3)

## 2015-12-30 LAB — UA PROTEIN, DIPSTICK - CHCC: Protein, ur: NEGATIVE mg/dL

## 2015-12-30 LAB — MAGNESIUM: Magnesium: 1.7 mg/dl (ref 1.5–2.5)

## 2015-12-30 MED ORDER — PALONOSETRON HCL INJECTION 0.25 MG/5ML
0.2500 mg | Freq: Once | INTRAVENOUS | Status: AC
  Administered 2015-12-30: 0.25 mg via INTRAVENOUS

## 2015-12-30 MED ORDER — SODIUM CHLORIDE 0.9% FLUSH
10.0000 mL | INTRAVENOUS | Status: DC | PRN
  Filled 2015-12-30: qty 10

## 2015-12-30 MED ORDER — SODIUM CHLORIDE 0.9 % IV SOLN
50.0000 mg/m2 | Freq: Once | INTRAVENOUS | Status: AC
  Administered 2015-12-30: 94 mg via INTRAVENOUS
  Filled 2015-12-30: qty 94

## 2015-12-30 MED ORDER — DIPHENHYDRAMINE HCL 25 MG PO TABS
12.5000 mg | ORAL_TABLET | Freq: Once | ORAL | Status: DC
  Filled 2015-12-30: qty 0.5

## 2015-12-30 MED ORDER — ACETAMINOPHEN 325 MG PO TABS
ORAL_TABLET | ORAL | Status: AC
  Filled 2015-12-30: qty 2

## 2015-12-30 MED ORDER — PACLITAXEL CHEMO INJECTION 300 MG/50ML
100.0000 mg/m2 | Freq: Once | INTRAVENOUS | Status: AC
  Administered 2015-12-30: 186 mg via INTRAVENOUS
  Filled 2015-12-30: qty 31

## 2015-12-30 MED ORDER — SODIUM CHLORIDE 0.9 % IV SOLN
Freq: Once | INTRAVENOUS | Status: AC
  Administered 2015-12-30: 09:00:00 via INTRAVENOUS

## 2015-12-30 MED ORDER — PEGFILGRASTIM 6 MG/0.6ML ~~LOC~~ PSKT
6.0000 mg | PREFILLED_SYRINGE | Freq: Once | SUBCUTANEOUS | Status: AC
  Administered 2015-12-30: 6 mg via SUBCUTANEOUS
  Filled 2015-12-30: qty 0.6

## 2015-12-30 MED ORDER — HEPARIN SOD (PORK) LOCK FLUSH 100 UNIT/ML IV SOLN
500.0000 [IU] | Freq: Once | INTRAVENOUS | Status: DC | PRN
  Filled 2015-12-30: qty 5

## 2015-12-30 MED ORDER — SODIUM CHLORIDE 0.9 % IV SOLN
Freq: Once | INTRAVENOUS | Status: AC
  Administered 2015-12-30: 13:00:00 via INTRAVENOUS
  Filled 2015-12-30: qty 5

## 2015-12-30 MED ORDER — FAMOTIDINE IN NACL 20-0.9 MG/50ML-% IV SOLN
20.0000 mg | Freq: Once | INTRAVENOUS | Status: AC
  Administered 2015-12-30: 20 mg via INTRAVENOUS

## 2015-12-30 MED ORDER — SODIUM CHLORIDE 0.9 % IV SOLN: Freq: Once | INTRAVENOUS | Status: DC

## 2015-12-30 MED ORDER — PALONOSETRON HCL INJECTION 0.25 MG/5ML
INTRAVENOUS | Status: AC
Start: 2015-12-30 — End: 2015-12-30
  Filled 2015-12-30: qty 5

## 2015-12-30 MED ORDER — ACETAMINOPHEN 325 MG PO TABS
650.0000 mg | ORAL_TABLET | Freq: Once | ORAL | Status: AC
Start: 2015-12-30 — End: 2015-12-30
  Administered 2015-12-30: 650 mg via ORAL

## 2015-12-30 MED ORDER — BEVACIZUMAB CHEMO INJECTION 400 MG/16ML
14.8800 mg/kg | Freq: Once | INTRAVENOUS | Status: AC
  Administered 2015-12-30: 1100 mg via INTRAVENOUS
  Filled 2015-12-30: qty 32

## 2015-12-30 MED ORDER — DEXTROSE-NACL 5-0.45 % IV SOLN
Freq: Once | INTRAVENOUS | Status: AC
  Administered 2015-12-30: 10:00:00 via INTRAVENOUS
  Filled 2015-12-30: qty 10

## 2015-12-30 MED ORDER — HEPARIN SOD (PORK) LOCK FLUSH 100 UNIT/ML IV SOLN
500.0000 [IU] | Freq: Once | INTRAVENOUS | Status: AC | PRN
  Administered 2015-12-30: 500 [IU]
  Filled 2015-12-30: qty 5

## 2015-12-30 MED ORDER — FAMOTIDINE IN NACL 20-0.9 MG/50ML-% IV SOLN
INTRAVENOUS | Status: AC
  Filled 2015-12-30: qty 50

## 2015-12-30 MED ORDER — SODIUM CHLORIDE 0.9% FLUSH
10.0000 mL | INTRAVENOUS | Status: DC | PRN
  Administered 2015-12-30: 10 mL
  Filled 2015-12-30: qty 10

## 2015-12-30 MED ORDER — DIPHENHYDRAMINE HCL 12.5 MG/5ML PO ELIX
12.5000 mg | ORAL_SOLUTION | Freq: Once | ORAL | Status: AC
  Administered 2015-12-30: 12.5 mg via ORAL
  Filled 2015-12-30: qty 5

## 2015-12-30 NOTE — Patient Instructions (Signed)
[place] Discharge Instructions for Patients Receiving Chemotherapy  Today you received the following chemotherapy agents:  Cisplatin, Taxol, and Avastin.  To help prevent nausea and vomiting after your treatment, we encourage you to take your nausea medication as directed.   If you develop nausea and vomiting that is not controlled by your nausea medication, call the clinic.   BELOW ARE SYMPTOMS THAT SHOULD BE REPORTED IMMEDIATELY:  *FEVER GREATER THAN 100.5 F  *CHILLS WITH OR WITHOUT FEVER  NAUSEA AND VOMITING THAT IS NOT CONTROLLED WITH YOUR NAUSEA MEDICATION  *UNUSUAL SHORTNESS OF BREATH  *UNUSUAL BRUISING OR BLEEDING  TENDERNESS IN MOUTH AND THROAT WITH OR WITHOUT PRESENCE OF ULCERS  *URINARY PROBLEMS  *BOWEL PROBLEMS  UNUSUAL RASH Items with * indicate a potential emergency and should be followed up as soon as possible.  Feel free to call the clinic you have any questions or concerns. The clinic phone number is [phone].  Please show the [place] at check-in to the Emergency Department and triage nurse.

## 2016-01-15 ENCOUNTER — Other Ambulatory Visit: Payer: Self-pay | Admitting: Oncology

## 2016-01-15 DIAGNOSIS — C799 Secondary malignant neoplasm of unspecified site: Secondary | ICD-10-CM

## 2016-01-19 ENCOUNTER — Ambulatory Visit (HOSPITAL_BASED_OUTPATIENT_CLINIC_OR_DEPARTMENT_OTHER): Payer: Commercial Managed Care - HMO | Admitting: Oncology

## 2016-01-19 ENCOUNTER — Other Ambulatory Visit (HOSPITAL_BASED_OUTPATIENT_CLINIC_OR_DEPARTMENT_OTHER): Payer: Commercial Managed Care - HMO

## 2016-01-19 ENCOUNTER — Ambulatory Visit (HOSPITAL_BASED_OUTPATIENT_CLINIC_OR_DEPARTMENT_OTHER): Payer: Commercial Managed Care - HMO

## 2016-01-19 ENCOUNTER — Encounter: Payer: Self-pay | Admitting: Oncology

## 2016-01-19 VITALS — BP 126/61 | HR 60 | Temp 97.5°F | Resp 18 | Ht 64.5 in | Wt 165.1 lb

## 2016-01-19 DIAGNOSIS — G620 Drug-induced polyneuropathy: Secondary | ICD-10-CM

## 2016-01-19 DIAGNOSIS — C787 Secondary malignant neoplasm of liver and intrahepatic bile duct: Secondary | ICD-10-CM

## 2016-01-19 DIAGNOSIS — C7802 Secondary malignant neoplasm of left lung: Secondary | ICD-10-CM

## 2016-01-19 DIAGNOSIS — T451X5A Adverse effect of antineoplastic and immunosuppressive drugs, initial encounter: Secondary | ICD-10-CM

## 2016-01-19 DIAGNOSIS — Z933 Colostomy status: Secondary | ICD-10-CM

## 2016-01-19 DIAGNOSIS — Z5112 Encounter for antineoplastic immunotherapy: Secondary | ICD-10-CM

## 2016-01-19 DIAGNOSIS — C7801 Secondary malignant neoplasm of right lung: Secondary | ICD-10-CM | POA: Diagnosis not present

## 2016-01-19 DIAGNOSIS — C801 Malignant (primary) neoplasm, unspecified: Secondary | ICD-10-CM

## 2016-01-19 DIAGNOSIS — C7989 Secondary malignant neoplasm of other specified sites: Secondary | ICD-10-CM

## 2016-01-19 DIAGNOSIS — C799 Secondary malignant neoplasm of unspecified site: Secondary | ICD-10-CM

## 2016-01-19 DIAGNOSIS — E559 Vitamin D deficiency, unspecified: Secondary | ICD-10-CM

## 2016-01-19 DIAGNOSIS — C539 Malignant neoplasm of cervix uteri, unspecified: Secondary | ICD-10-CM

## 2016-01-19 DIAGNOSIS — B009 Herpesviral infection, unspecified: Secondary | ICD-10-CM

## 2016-01-19 DIAGNOSIS — D701 Agranulocytosis secondary to cancer chemotherapy: Secondary | ICD-10-CM

## 2016-01-19 LAB — COMPREHENSIVE METABOLIC PANEL
ALT: 15 U/L (ref 0–55)
ANION GAP: 8 meq/L (ref 3–11)
AST: 15 U/L (ref 5–34)
Albumin: 3.5 g/dL (ref 3.5–5.0)
Alkaline Phosphatase: 124 U/L (ref 40–150)
BUN: 18 mg/dL (ref 7.0–26.0)
CHLORIDE: 109 meq/L (ref 98–109)
CO2: 22 meq/L (ref 22–29)
CREATININE: 1 mg/dL (ref 0.6–1.1)
Calcium: 9.1 mg/dL (ref 8.4–10.4)
EGFR: 56 mL/min/{1.73_m2} — ABNORMAL LOW (ref >90)
GLUCOSE: 87 mg/dL (ref 70–140)
Potassium: 4.6 mEq/L (ref 3.5–5.1)
SODIUM: 140 meq/L (ref 136–145)
Total Bilirubin: 0.38 mg/dL (ref 0.20–1.20)
Total Protein: 7.1 g/dL (ref 6.4–8.3)

## 2016-01-19 LAB — CBC WITH DIFFERENTIAL/PLATELET
BASO%: 0.5 % (ref 0.0–2.0)
Basophils Absolute: 0 10*3/uL (ref 0.0–0.1)
EOS%: 0.8 % (ref 0.0–7.0)
Eosinophils Absolute: 0.1 10*3/uL (ref 0.0–0.5)
HCT: 33.4 % — ABNORMAL LOW (ref 34.8–46.6)
HGB: 11.2 g/dL — ABNORMAL LOW (ref 11.6–15.9)
LYMPH%: 23.3 % (ref 14.0–49.7)
MCH: 30.4 pg (ref 25.1–34.0)
MCHC: 33.5 g/dL (ref 31.5–36.0)
MCV: 90.5 fL (ref 79.5–101.0)
MONO#: 0.6 10*3/uL (ref 0.1–0.9)
MONO%: 9.5 % (ref 0.0–14.0)
NEUT#: 4.3 10*3/uL (ref 1.5–6.5)
NEUT%: 65.9 % (ref 38.4–76.8)
PLATELETS: 187 10*3/uL (ref 145–400)
RBC: 3.69 10*6/uL — AB (ref 3.70–5.45)
RDW: 13.7 % (ref 11.2–14.5)
WBC: 6.4 10*3/uL (ref 3.9–10.3)
lymph#: 1.5 10*3/uL (ref 0.9–3.3)

## 2016-01-19 LAB — UA PROTEIN, DIPSTICK - CHCC: PROTEIN: NEGATIVE mg/dL

## 2016-01-19 LAB — MAGNESIUM: Magnesium: 1.5 mg/dl (ref 1.5–2.5)

## 2016-01-19 MED ORDER — HEPARIN SOD (PORK) LOCK FLUSH 100 UNIT/ML IV SOLN
500.0000 [IU] | INTRAVENOUS | Status: AC | PRN
  Administered 2016-01-19: 500 [IU]
  Filled 2016-01-19: qty 5

## 2016-01-19 MED ORDER — SODIUM CHLORIDE 0.9 % IJ SOLN
10.0000 mL | INTRAMUSCULAR | Status: AC | PRN
  Administered 2016-01-19: 10 mL
  Filled 2016-01-19: qty 10

## 2016-01-19 NOTE — Progress Notes (Signed)
OFFICE PROGRESS NOTE   January 19, 2016   Physicians:  D.Lyna Poser (PCP), Leighton Ruff  INTERVAL HISTORY:  Patient is seen, alone for visit, in continuing attention to metastatic,  high grade, poorly differentiated cervical vs endocervical carcinoma. She has had ongoing response to CDDP taxol avastin, cycle 6 given 12-30-15. Last imaging was CT CAP 12-14-15.  She saw PCP Dr Danise Mina recently, prn follow up with him  Quality of life is priority for patient, as she understands that treatment is not curative. Note both of her children deceased, including son with cerebral palsy whom she cared for until he died 60 years ago. We have been flexible with chemo intervals to allow trips to beach etc.  Patient tolerated most recent treatment better with taxol dose reduced from 135 mg/m2 to 100 mg/m2, and premed benadryl decreased to 12.5 mg po. She still was mostly in bed for a week after treatment, for what she describes as general weakness, but otherwise had no significant taxol aches, was able to maintain oral hydration, no nausea. Occasional right foot numbness unchanged, no other peripheral neuropathy. She denies SOB or chest pain, abdominal discomfort, any bleeding, or LE swelling. Colostomy functions well. She has urinary incontinence when she first gets up in AMs, but manages this with regular voiding thru day. She has had no problems with PAC, no fever or symptoms of infection. She has been doing all regular activities since a week after treatment.  Remainder of 10 point Review of Systems negative.    PAC functioning without difficulty Flu vaccine 12-06-15  ONCOLOGIC HISTORY Patient was in her usual very good health until new cramping abdominal pain intermittently for last few months. She had some constipation with this, particularly severe when she saw PCP for this reason on 04-26-15. She has had no vaginal bleeding. She had no bowel obstruction by xray and bowels moved  with miralax. CT AP 04-26-15 had 10.3 x 13.8 mixed cystic and solid mass involving uterus and extending posteriorly into cul de sac and into bilateral adnexal regions, sigmoid colon tethered to mass, no ascites, scattered necrotic lymph nodes in mesentery and omentum, no hydroureter, 3 lesions in liver up to 3.4 cm, and numerous bilateral pulmonary nodules in lung bases. CEA was 1.1 and CA 125 was 105; CBC and CMET 04-26-15 were entirely normal. 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 She had CT biopsy of pelvic mass in region of left psoas on 3-1-1 7. Pathology [id number], showed high grade poorly differentiated carcinoma with immunohistochemical stains suggesting cervical primary, which did not fit clinical picture well. CT chest 05-10-15 showed multiple bilateral pulmonary nodules (>40), 3 liver lesions up to 3.7 cm, left supraclavicular node and left hilar nodes. Chemotherapy was begun with first carboplatin and taxol on 05-12-15. Admitted with colonic obstruction 05-15-15, diverting sigmoid loop colostomy 05-18-15. Neutropenic 05-19-15 (day 8 cycle 1) despite granix begun day 5. Chemo changed to CDDP taxol with review of the pathology information, cycle 1 delayed due to inadequate IV access on 06-09-15. She had first CDDP taxol on 06-23-15 with on pro neulasta, no documented neutropenia and only mild thrombocytopenia (120k). Avastin was added to CDDP taxol with treatment 07-14-15. CT CAP 12-14-15 compared with 09-2015 improved, with multiple bilateral pulmonary nodules stable in size to decreased, liver lesions are stable to decreased, area encompassing the uterus and adnexal region decreased in size, no new or progressive disease .   Objective:  Vital signs in  last 24 hours:  BP 126/61 (BP Location: Left Arm, Patient Position: Sitting)   Pulse 60   Temp 97.5 F (36.4 C) (Oral)   Resp 18   Ht 5' 4.5" (1.638 m)   Wt 165 lb 1.6 oz (74.9 kg)   SpO2  100%   BMI 27.90 kg/m  Weight up 4 lbs Alert, oriented and appropriate. Ambulatory without difficulty. Looks energetic and entirely comfortable, respirations not labored, just delightful as always. Alopecia  HEENT:PERRL, sclerae not icteric. Oral mucosa moist without lesions, posterior pharynx clear.  Neck supple. No JVD.  Lymphatics:no cervical,supraclavicular or inguinal adenopathy Resp: clear to auscultation bilaterally and normal percussion bilaterally Cardio: regular rate and rhythm. No gallop. GI: soft, nontender, not distended, no mass or organomegaly. Normally active bowel sounds. Ostomy site not remarkable Musculoskeletal/ Extremities: LE without pitting edema, cords, tenderness Neuro: no peripheral neuropathy. Otherwise nonfocal. PSYCH appropriate mood and affect Skin without rash, ecchymosis, petechiae Portacath-without erythema or tenderness  Lab Results:  Results for orders placed or performed in visit on 01/19/16  CBC with Differential  Result Value Ref Range   WBC 6.4 3.9 - 10.3 10e3/uL   NEUT# 4.3 1.5 - 6.5 10e3/uL   HGB 11.2 (L) 11.6 - 15.9 g/dL   HCT 33.4 (L) 34.8 - 46.6 %   Platelets 187 145 - 400 10e3/uL   MCV 90.5 79.5 - 101.0 fL   MCH 30.4 25.1 - 34.0 pg   MCHC 33.5 31.5 - 36.0 g/dL   RBC 3.69 (L) 3.70 - 5.45 10e6/uL   RDW 13.7 11.2 - 14.5 %   lymph# 1.5 0.9 - 3.3 10e3/uL   MONO# 0.6 0.1 - 0.9 10e3/uL   Eosinophils Absolute 0.1 0.0 - 0.5 10e3/uL   Basophils Absolute 0.0 0.0 - 0.1 10e3/uL   NEUT% 65.9 38.4 - 76.8 %   LYMPH% 23.3 14.0 - 49.7 %   MONO% 9.5 0.0 - 14.0 %   EOS% 0.8 0.0 - 7.0 %   BASO% 0.5 0.0 - 2.0 %  Comprehensive metabolic panel  Result Value Ref Range   Sodium 140 136 - 145 mEq/L   Potassium 4.6 3.5 - 5.1 mEq/L   Chloride 109 98 - 109 mEq/L   CO2 22 22 - 29 mEq/L   Glucose 87 70 - 140 mg/dl   BUN 18.0 7.0 - 26.0 mg/dL   Creatinine 1.0 0.6 - 1.1 mg/dL   Total Bilirubin 0.38 0.20 - 1.20 mg/dL   Alkaline Phosphatase 124 40 - 150  U/L   AST 15 5 - 34 U/L   ALT 15 0 - 55 U/L   Total Protein 7.1 6.4 - 8.3 g/dL   Albumin 3.5 3.5 - 5.0 g/dL   Calcium 9.1 8.4 - 10.4 mg/dL   Anion Gap 8 3 - 11 mEq/L   EGFR 56 (L) >90 ml/min/1.73 m2  Magnesium  Result Value Ref Range   Magnesium 1.5 1.5 - 2.5 mg/dl  Urine protein by dipstick  Result Value Ref Range   Protein, ur Negative Negative- <30 mg/dL     Studies/Results:  No results found.  Medications: I have reviewed the patient's current medications. VIt D low at 20, now on D3 1000 mg daily.  DISCUSSION Clinically she is doing very well with PS 0 other than ~ a week after each treatment. Patient feels that doses used for cycle 6 were more tolerable  We have carefully looked at timing of next treatment, however she does not want any chemo until after Christmas. She  understands that this may not be ideal for treatment, but is glad to be feeling so well now. I will see her with treatment late Dec. She is aware that another medical oncologist will take over her care beginning Jan.  She has not wanted urology evaluation for the urinary incontinence.  Assessment/Plan: 1. 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 . She has been reluctant to have CDDP taxol avastin every 3 weeks due to fatigue and other side effects, but is still responding to treatment as has been given. Aloxi and emend with other antiemetics, IVF (+ IV antiemetics) after rx if unable to do oral hydration adequately. Neulasta by on pro injector. At he request will hold treatment until shortly after Christmas.   2.post diverting sigmoid loop colostomy 05-18-15 for distal bowel obstruction from pelvic malignancy. Ostomy functioning well .  3.PAC in  4.chemo neutropenia with cycle 1 carbo taxol: on pro neulasta  5.nutritional status now excellent. 6.perirectal HSV:  continue  prophylactic acyclovir 7.flu vaccine done 12-06-15 8. No advance directives: continue to address. She understands goal of treatment is not curative 9. Hypomagnesemia related to CDDP: K fine, magnesium improved with oral + IV supplements, continue 10.chemo peripheral neuropathy: Taxol and CDDP. More noticeable in feet. Taxol to 100 mg/m2 better tolerated 11. Low VIt D on supplement by PCP  All questions answered and she knows to call if any concerns prior to next scheduled visit. Time spent 25 min including >50% counseling and coordination of care.    Evlyn Clines, MD   01/19/2016, 6:08 PM

## 2016-01-21 ENCOUNTER — Other Ambulatory Visit: Payer: Self-pay | Admitting: Oncology

## 2016-01-21 DIAGNOSIS — C539 Malignant neoplasm of cervix uteri, unspecified: Secondary | ICD-10-CM

## 2016-01-27 ENCOUNTER — Ambulatory Visit: Payer: Commercial Managed Care - HMO

## 2016-01-27 ENCOUNTER — Other Ambulatory Visit: Payer: Commercial Managed Care - HMO

## 2016-02-01 ENCOUNTER — Telehealth: Payer: Self-pay | Admitting: *Deleted

## 2016-02-01 DIAGNOSIS — C539 Malignant neoplasm of cervix uteri, unspecified: Secondary | ICD-10-CM

## 2016-02-01 MED ORDER — MAGNESIUM 400 MG PO CAPS: 400.0000 mg | ORAL_CAPSULE | ORAL | 1 refills | Status: DC

## 2016-02-01 NOTE — Telephone Encounter (Signed)
Received call @ 1120 in regards to a refill request  For MAGNESIUM 400mg 

## 2016-02-01 NOTE — Telephone Encounter (Signed)
Magnesium prescription sent to pharmacy

## 2016-02-16 ENCOUNTER — Other Ambulatory Visit: Payer: Commercial Managed Care - HMO

## 2016-02-16 ENCOUNTER — Ambulatory Visit: Payer: Commercial Managed Care - HMO | Admitting: Oncology

## 2016-02-17 ENCOUNTER — Ambulatory Visit: Payer: Commercial Managed Care - HMO

## 2016-02-17 ENCOUNTER — Other Ambulatory Visit: Payer: Commercial Managed Care - HMO

## 2016-02-17 DIAGNOSIS — Z85038 Personal history of other malignant neoplasm of large intestine: Secondary | ICD-10-CM | POA: Diagnosis not present

## 2016-02-17 DIAGNOSIS — Z933 Colostomy status: Secondary | ICD-10-CM | POA: Diagnosis not present

## 2016-02-24 ENCOUNTER — Telehealth: Payer: Self-pay

## 2016-02-24 DIAGNOSIS — C539 Malignant neoplasm of cervix uteri, unspecified: Secondary | ICD-10-CM

## 2016-02-24 MED ORDER — DEXAMETHASONE 4 MG PO TABS: ORAL_TABLET | ORAL | 0 refills | Status: DC

## 2016-02-24 NOTE — Telephone Encounter (Signed)
Incoming electronic refill request for decadron received. S/w pt and refilled for 1 cycle of taxol chemotherapy.

## 2016-02-28 ENCOUNTER — Other Ambulatory Visit: Payer: Self-pay | Admitting: Oncology

## 2016-03-01 ENCOUNTER — Ambulatory Visit (HOSPITAL_BASED_OUTPATIENT_CLINIC_OR_DEPARTMENT_OTHER): Payer: Commercial Managed Care - HMO | Admitting: Oncology

## 2016-03-01 ENCOUNTER — Other Ambulatory Visit (HOSPITAL_BASED_OUTPATIENT_CLINIC_OR_DEPARTMENT_OTHER): Payer: Commercial Managed Care - HMO

## 2016-03-01 ENCOUNTER — Encounter: Payer: Self-pay | Admitting: Oncology

## 2016-03-01 ENCOUNTER — Ambulatory Visit: Payer: Commercial Managed Care - HMO

## 2016-03-01 ENCOUNTER — Ambulatory Visit (HOSPITAL_BASED_OUTPATIENT_CLINIC_OR_DEPARTMENT_OTHER): Payer: Commercial Managed Care - HMO

## 2016-03-01 VITALS — BP 133/75 | HR 74 | Temp 97.5°F | Resp 18 | Ht 64.5 in | Wt 168.5 lb

## 2016-03-01 DIAGNOSIS — C7989 Secondary malignant neoplasm of other specified sites: Secondary | ICD-10-CM

## 2016-03-01 DIAGNOSIS — D701 Agranulocytosis secondary to cancer chemotherapy: Secondary | ICD-10-CM | POA: Diagnosis not present

## 2016-03-01 DIAGNOSIS — Z5112 Encounter for antineoplastic immunotherapy: Secondary | ICD-10-CM

## 2016-03-01 DIAGNOSIS — C7800 Secondary malignant neoplasm of unspecified lung: Secondary | ICD-10-CM | POA: Diagnosis not present

## 2016-03-01 DIAGNOSIS — C799 Secondary malignant neoplasm of unspecified site: Secondary | ICD-10-CM

## 2016-03-01 DIAGNOSIS — Z933 Colostomy status: Secondary | ICD-10-CM

## 2016-03-01 DIAGNOSIS — G620 Drug-induced polyneuropathy: Secondary | ICD-10-CM

## 2016-03-01 DIAGNOSIS — C787 Secondary malignant neoplasm of liver and intrahepatic bile duct: Secondary | ICD-10-CM

## 2016-03-01 DIAGNOSIS — C7802 Secondary malignant neoplasm of left lung: Secondary | ICD-10-CM

## 2016-03-01 DIAGNOSIS — C7801 Secondary malignant neoplasm of right lung: Secondary | ICD-10-CM

## 2016-03-01 DIAGNOSIS — E559 Vitamin D deficiency, unspecified: Secondary | ICD-10-CM

## 2016-03-01 DIAGNOSIS — T451X5A Adverse effect of antineoplastic and immunosuppressive drugs, initial encounter: Secondary | ICD-10-CM

## 2016-03-01 DIAGNOSIS — C539 Malignant neoplasm of cervix uteri, unspecified: Secondary | ICD-10-CM | POA: Diagnosis not present

## 2016-03-01 DIAGNOSIS — Z5111 Encounter for antineoplastic chemotherapy: Secondary | ICD-10-CM

## 2016-03-01 LAB — CBC WITH DIFFERENTIAL/PLATELET
BASO%: 0 % (ref 0.0–2.0)
BASOS ABS: 0 10*3/uL (ref 0.0–0.1)
EOS ABS: 0 10*3/uL (ref 0.0–0.5)
EOS%: 0 % (ref 0.0–7.0)
HEMATOCRIT: 37.6 % (ref 34.8–46.6)
HEMOGLOBIN: 12.6 g/dL (ref 11.6–15.9)
LYMPH#: 0.5 10*3/uL — AB (ref 0.9–3.3)
LYMPH%: 5.6 % — ABNORMAL LOW (ref 14.0–49.7)
MCH: 29.8 pg (ref 25.1–34.0)
MCHC: 33.5 g/dL (ref 31.5–36.0)
MCV: 88.9 fL (ref 79.5–101.0)
MONO#: 0 10*3/uL — ABNORMAL LOW (ref 0.1–0.9)
MONO%: 0.2 % (ref 0.0–14.0)
NEUT%: 94.2 % — ABNORMAL HIGH (ref 38.4–76.8)
NEUTROS ABS: 8.7 10*3/uL — AB (ref 1.5–6.5)
Platelets: 162 10*3/uL (ref 145–400)
RBC: 4.23 10*6/uL (ref 3.70–5.45)
RDW: 13 % (ref 11.2–14.5)
WBC: 9.3 10*3/uL (ref 3.9–10.3)

## 2016-03-01 LAB — COMPREHENSIVE METABOLIC PANEL
ALBUMIN: 3.8 g/dL (ref 3.5–5.0)
ALK PHOS: 149 U/L (ref 40–150)
ALT: 20 U/L (ref 0–55)
AST: 18 U/L (ref 5–34)
Anion Gap: 11 mEq/L (ref 3–11)
BILIRUBIN TOTAL: 0.43 mg/dL (ref 0.20–1.20)
BUN: 19.6 mg/dL (ref 7.0–26.0)
CALCIUM: 9.7 mg/dL (ref 8.4–10.4)
CO2: 20 mEq/L — ABNORMAL LOW (ref 22–29)
Chloride: 105 mEq/L (ref 98–109)
Creatinine: 1.3 mg/dL — ABNORMAL HIGH (ref 0.6–1.1)
EGFR: 42 mL/min/{1.73_m2} — AB (ref >90)
GLUCOSE: 249 mg/dL — AB (ref 70–140)
POTASSIUM: 4.4 meq/L (ref 3.5–5.1)
Sodium: 137 mEq/L (ref 136–145)
TOTAL PROTEIN: 7.9 g/dL (ref 6.4–8.3)

## 2016-03-01 LAB — UA PROTEIN, DIPSTICK - CHCC: Protein, ur: NEGATIVE mg/dL

## 2016-03-01 LAB — MAGNESIUM: MAGNESIUM: 1.6 mg/dL (ref 1.5–2.5)

## 2016-03-01 MED ORDER — DIPHENHYDRAMINE HCL 12.5 MG/5ML PO ELIX
12.5000 mg | ORAL_SOLUTION | Freq: Once | ORAL | Status: AC
  Administered 2016-03-01: 12.5 mg via ORAL
  Filled 2016-03-01: qty 5

## 2016-03-01 MED ORDER — HEPARIN SOD (PORK) LOCK FLUSH 100 UNIT/ML IV SOLN
500.0000 [IU] | Freq: Once | INTRAVENOUS | Status: AC | PRN
  Administered 2016-03-01: 500 [IU]
  Filled 2016-03-01: qty 5

## 2016-03-01 MED ORDER — SODIUM CHLORIDE 0.9 % IV SOLN
Freq: Once | INTRAVENOUS | Status: AC
  Administered 2016-03-01: 10:00:00 via INTRAVENOUS

## 2016-03-01 MED ORDER — BEVACIZUMAB CHEMO INJECTION 400 MG/16ML
14.8800 mg/kg | Freq: Once | INTRAVENOUS | Status: AC
  Administered 2016-03-01: 1100 mg via INTRAVENOUS
  Filled 2016-03-01: qty 32

## 2016-03-01 MED ORDER — ACETAMINOPHEN 325 MG PO TABS
650.0000 mg | ORAL_TABLET | Freq: Once | ORAL | Status: AC
  Administered 2016-03-01: 650 mg via ORAL

## 2016-03-01 MED ORDER — FAMOTIDINE IN NACL 20-0.9 MG/50ML-% IV SOLN
INTRAVENOUS | Status: AC
  Filled 2016-03-01: qty 50

## 2016-03-01 MED ORDER — PEGFILGRASTIM 6 MG/0.6ML ~~LOC~~ PSKT
6.0000 mg | PREFILLED_SYRINGE | Freq: Once | SUBCUTANEOUS | Status: AC
  Administered 2016-03-01: 6 mg via SUBCUTANEOUS
  Filled 2016-03-01: qty 0.6

## 2016-03-01 MED ORDER — FAMOTIDINE IN NACL 20-0.9 MG/50ML-% IV SOLN
20.0000 mg | Freq: Once | INTRAVENOUS | Status: AC
Start: 2016-03-01 — End: 2016-03-01
  Administered 2016-03-01: 20 mg via INTRAVENOUS

## 2016-03-01 MED ORDER — ONDANSETRON HCL 4 MG/2ML IJ SOLN
INTRAMUSCULAR | Status: AC
  Filled 2016-03-01: qty 2

## 2016-03-01 MED ORDER — SODIUM CHLORIDE 0.9% FLUSH
10.0000 mL | INTRAVENOUS | Status: DC | PRN
  Administered 2016-03-01: 10 mL
  Filled 2016-03-01: qty 10

## 2016-03-01 MED ORDER — DEXTROSE 5 % IV SOLN
100.0000 mg/m2 | Freq: Once | INTRAVENOUS | Status: AC
  Administered 2016-03-01: 186 mg via INTRAVENOUS
  Filled 2016-03-01: qty 31

## 2016-03-01 MED ORDER — SODIUM CHLORIDE 0.9 % IV SOLN
Freq: Once | INTRAVENOUS | Status: AC
  Administered 2016-03-01: 11:00:00 via INTRAVENOUS
  Filled 2016-03-01: qty 1000

## 2016-03-01 MED ORDER — SODIUM CHLORIDE 0.9 % IV SOLN
Freq: Once | INTRAVENOUS | Status: AC
  Administered 2016-03-01: 12:00:00 via INTRAVENOUS
  Filled 2016-03-01: qty 5

## 2016-03-01 MED ORDER — SODIUM CHLORIDE 0.9 % IJ SOLN
10.0000 mL | INTRAMUSCULAR | Status: AC | PRN
  Administered 2016-03-01: 10 mL
  Filled 2016-03-01: qty 10

## 2016-03-01 NOTE — Progress Notes (Signed)
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 Patient requested treatment break thru Thanksgiving and Christmas holidays; plan is to continue treatment now.  Patient has felt very well, with exception of much increased watery output from colostomy since adding oral magnesium. She tries to push po fluids and is not symptomatic with dehydration, however creatinine is more elevated today. She has had no recent antibiotics, no abdominal pain or cramping, no nausea or vomiting. Energy is excellent, has been raking yard and cleaning house. No SOB, cough, chest pain. Occasional numbness in right foot, otherwise no peripheral neuropathy symptoms now from chemo. Occasional tingling in bilateral thighs, occasional sharp pain left foot, unclear etiology. No problems with PAC. No fever or symptoms of infection. No bleeding.  Some soreness upper left breast which she feels may be activity related. Remainder of 10 point Review of Systems negative.    PAC Flu vaccine 12-06-15  ONCOLOGIC HISTORY  Patient was in her usual very good health until cramping abdominal pain intermittently for a few months, with some constipation, particularly severe when she saw PCP  04-26-15. She had no vaginal bleeding. She had no bowel obstruction by xray and bowels moved with miralax. CT AP 04-26-15 had 10.3 x 13.8 mixed cystic and solid mass involving uterus and extending posteriorly into cul de sac and into bilateral adnexal regions, sigmoid colon tethered to mass, no ascites, scattered necrotic lymph nodes in mesentery and omentum, no hydroureter, 3 lesions in liver up to 3.4 cm, and numerous bilateral pulmonary nodules in lung bases. CEA was 1.1  and CA 125 was 105; CBC and CMET 04-26-15 were entirely normal. 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 She had CT biopsy of pelvic mass in region of left psoas on 3-1-1 7. Pathology [phone], showed high grade poorly differentiated carcinoma with immunohistochemical stains suggesting cervical primary. CT chest 05-10-15 showed multiple bilateral pulmonary nodules (>40), 3 liver lesions up to 3.7 cm, left supraclavicular node and left hilar nodes. Chemotherapy was begun with one cycle of carboplatin and taxol on 05-12-15. Admitted with colonic obstruction 05-15-15, diverting sigmoid loop colostomy 05-18-15. Neutropenic cycle 1.. Chemo changed to CDDP taxol based on pathology. First CDDP taxol 06-23-15 with on pro neulasta. Avastin was added to CDDP taxol  07-14-15. CT CAP 12-14-15 compared with 09-2015 improved, with multiple bilateral pulmonary nodules stable in size to decreased,liver lesions are stable to decreased, areaencompassing the uterus and adnexal region decreasedin size, no new or progressive disease .   Objective:  Vital signs in last 24 hours:  BP 133/75 (BP Location: Left Arm, Patient Position: Sitting)   Pulse 74   Temp 97.5 F (36.4 C) (Oral)   Resp 18   Ht 5' 4.5" (1.638 m)   Wt 168 lb 8 oz (76.4 kg)   SpO2 99%   BMI 28.48 kg/m  Weight up 3 lbs Alert, oriented and appropriate. Ambulatory without difficulty.  Haur growing back in break off of chemo  HEENT:PERRL, sclerae not icteric. Oral mucosa moist without lesions, posterior pharynx clear.  Neck supple. No JVD.  Lymphatics:no cervical,supraclavicular or inguinal adenopathy Resp: clear to auscultation bilaterally and normal percussion bilaterally Cardio: regular rate and rhythm. No gallop. GI: soft, nontender, not  distended, no mass or organomegaly. Normally active bowel sounds. Ostomy on left, bag empty now, stoma pink Musculoskeletal/ Extremities: without pitting  edema, cords, tenderness Neuro: no peripheral neuropathy. Otherwise nonfocal. PSYCH appropriate mood and affect Skin without rash, ecchymosis, petechiae Breasts: bilaterally without dominant mass, skin or nipple findings. No tenderness or findings of concern left upper breast . Axillae benign. Portacath-without erythema or tenderness  Lab Results:  Results for orders placed or performed in visit on 03/01/16  CBC with Differential  Result Value Ref Range   WBC 9.3 3.9 - 10.3 10e3/uL   NEUT# 8.7 (H) 1.5 - 6.5 10e3/uL   HGB 12.6 11.6 - 15.9 g/dL   HCT 37.6 34.8 - 46.6 %   Platelets 162 145 - 400 10e3/uL   MCV 88.9 79.5 - 101.0 fL   MCH 29.8 25.1 - 34.0 pg   MCHC 33.5 31.5 - 36.0 g/dL   RBC 4.23 3.70 - 5.45 10e6/uL   RDW 13.0 11.2 - 14.5 %   lymph# 0.5 (L) 0.9 - 3.3 10e3/uL   MONO# 0.0 (L) 0.1 - 0.9 10e3/uL   Eosinophils Absolute 0.0 0.0 - 0.5 10e3/uL   Basophils Absolute 0.0 0.0 - 0.1 10e3/uL   NEUT% 94.2 (H) 38.4 - 76.8 %   LYMPH% 5.6 (L) 14.0 - 49.7 %   MONO% 0.2 0.0 - 14.0 %   EOS% 0.0 0.0 - 7.0 %   BASO% 0.0 0.0 - 2.0 %  Comprehensive metabolic panel  Result Value Ref Range   Sodium 137 136 - 145 mEq/L   Potassium 4.4 3.5 - 5.1 mEq/L   Chloride 105 98 - 109 mEq/L   CO2 20 (L) 22 - 29 mEq/L   Glucose 249 (H) 70 - 140 mg/dl   BUN 19.6 7.0 - 26.0 mg/dL   Creatinine 1.3 (H) 0.6 - 1.1 mg/dL   Total Bilirubin 0.43 0.20 - 1.20 mg/dL   Alkaline Phosphatase 149 40 - 150 U/L   AST 18 5 - 34 U/L   ALT 20 0 - 55 U/L   Total Protein 7.9 6.4 - 8.3 g/dL   Albumin 3.8 3.5 - 5.0 g/dL   Calcium 9.7 8.4 - 10.4 mg/dL   Anion Gap 11 3 - 11 mEq/L   EGFR 42 (L) >90 ml/min/1.73 m2  Magnesium  Result Value Ref Range   Magnesium 1.6 1.5 - 2.5 mg/dl  Urine protein by dipstick  Result Value Ref Range   Protein, ur Negative Negative- <30 mg/dL     Studies/Results:  No results found.  Medications: I have reviewed the patient's current medications. Stop oral magnesium No CDDP  today   DISCUSSION Chemo today taxol and avastin only as creatinine higher, may be related to increased liquid output from ostomy since oral magnesium and in adequate po fluids to keep up with this.  Patient understands goal of treatment is to keep the metastatic cancer controlled while still maintaining overall quality of life as best possible, and is in agreement with continuing tho she hates feeling badly after chemo. This cycle 7 will be second cycle after last scans on 12-14-15.  I will request appointment with Dr Josephina Shih ~ March, may be useful to get scans prior.   Assessment/Plan:  1. 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 She has been reluctant to have CDDP taxol avastin every 3 weeks due to fatigue and other side effects, but is still responding  to treatment as has been given.Will give taxol and avastin today, hold CDDP due to some dehydration. Aloxi and emend with other antiemetics, has been able to do adequate oral hydration after treatment in preference to additional IVF.  2.post diverting sigmoid loop colostomy 05-18-15 for distal bowel obstruction from pelvic malignancy. Stop oral magnesium (added recently for CDDP hypoMg).  3.PAC in  4.chemo neutropenia with cycle 1 carbo taxol: on pro neulasta  5.nutritional status now excellent. 6.perirectal HSV:  continue prophylactic acyclovir 7.flu vaccine done 12-06-15 8. No advance directives: She does understand that goal of treatment is not curative 9. Hypomagnesemia related to CDDP resolved,  K fine, no CDDP today. Hold po magnesium as above 10.chemo peripheral neuropathy: improved with short break fromTaxol and CDDP. More noticeable in feet. Taxol to 100 mg/m2 better tolerated 11. 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 She is CNA.\   All questions answered and she is in agreement with recommendations and plans. Chemo and premeds adjusted, will give one liter IVF, avastin confirmed. Time spent 25 min including >50% counseling and coordination of care. Route PCP, message to gyn onc   Evlyn Clines, MD   03/01/2016, 8:08 PM

## 2016-03-01 NOTE — Patient Instructions (Signed)
[facility] Discharge Instructions for Patients Receiving Chemotherapy  Today you received the following chemotherapy agents:  Avastin, taxol  To help prevent nausea and vomiting after your treatment, we encourage you to take your nausea medication.  Take it as often as prescribed.     If you develop nausea and vomiting that is not controlled by your nausea medication, call the clinic. If it is after clinic hours your family physician or the after hours number for the clinic or go to the Emergency Department.   BELOW ARE SYMPTOMS THAT SHOULD BE REPORTED IMMEDIATELY:  *FEVER GREATER THAN 100.5 F  *CHILLS WITH OR WITHOUT FEVER  NAUSEA AND VOMITING THAT IS NOT CONTROLLED WITH YOUR NAUSEA MEDICATION  *UNUSUAL SHORTNESS OF BREATH  *UNUSUAL BRUISING OR BLEEDING  TENDERNESS IN MOUTH AND THROAT WITH OR WITHOUT PRESENCE OF ULCERS  *URINARY PROBLEMS  *BOWEL PROBLEMS  UNUSUAL RASH Items with * indicate a potential emergency and should be followed up as soon as possible.  One of the nurses will contact you 24 hours after your treatment. Please let the nurse know about any problems that you may have experienced. Feel free to call the clinic you have any questions or concerns. The clinic phone number is [phone].   I have been informed and understand all the instructions given to me. I know to contact the clinic, my physician, or go to the Emergency Department if any problems should occur. I do not have any questions at this time, but understand that I may call the clinic during office hours   should I have any questions or need assistance in obtaining follow up care.    __________________________________________  _____________  __________ Signature of Patient or Authorized Representative            Date                   Time    __________________________________________ Nurse's Signature

## 2016-03-03 ENCOUNTER — Encounter: Payer: Self-pay | Admitting: Oncology

## 2016-03-06 ENCOUNTER — Other Ambulatory Visit: Payer: Self-pay | Admitting: Gynecologic Oncology

## 2016-03-06 DIAGNOSIS — C799 Secondary malignant neoplasm of unspecified site: Secondary | ICD-10-CM

## 2016-03-25 ENCOUNTER — Other Ambulatory Visit: Payer: Self-pay | Admitting: Oncology

## 2016-03-25 DIAGNOSIS — C539 Malignant neoplasm of cervix uteri, unspecified: Secondary | ICD-10-CM

## 2016-03-29 ENCOUNTER — Ambulatory Visit (HOSPITAL_BASED_OUTPATIENT_CLINIC_OR_DEPARTMENT_OTHER): Payer: Medicare HMO | Admitting: Oncology

## 2016-03-29 ENCOUNTER — Other Ambulatory Visit (HOSPITAL_BASED_OUTPATIENT_CLINIC_OR_DEPARTMENT_OTHER): Payer: Medicare HMO

## 2016-03-29 VITALS — BP 121/74 | HR 76 | Temp 97.4°F | Resp 18 | Ht 64.5 in | Wt 169.0 lb

## 2016-03-29 DIAGNOSIS — C787 Secondary malignant neoplasm of liver and intrahepatic bile duct: Secondary | ICD-10-CM | POA: Diagnosis not present

## 2016-03-29 DIAGNOSIS — C801 Malignant (primary) neoplasm, unspecified: Secondary | ICD-10-CM

## 2016-03-29 DIAGNOSIS — C539 Malignant neoplasm of cervix uteri, unspecified: Secondary | ICD-10-CM

## 2016-03-29 DIAGNOSIS — Z95828 Presence of other vascular implants and grafts: Secondary | ICD-10-CM

## 2016-03-29 DIAGNOSIS — B009 Herpesviral infection, unspecified: Secondary | ICD-10-CM

## 2016-03-29 DIAGNOSIS — E559 Vitamin D deficiency, unspecified: Secondary | ICD-10-CM

## 2016-03-29 DIAGNOSIS — C7989 Secondary malignant neoplasm of other specified sites: Secondary | ICD-10-CM | POA: Diagnosis not present

## 2016-03-29 DIAGNOSIS — C7800 Secondary malignant neoplasm of unspecified lung: Secondary | ICD-10-CM

## 2016-03-29 DIAGNOSIS — Z933 Colostomy status: Secondary | ICD-10-CM

## 2016-03-29 DIAGNOSIS — C799 Secondary malignant neoplasm of unspecified site: Secondary | ICD-10-CM

## 2016-03-29 DIAGNOSIS — T451X5A Adverse effect of antineoplastic and immunosuppressive drugs, initial encounter: Secondary | ICD-10-CM

## 2016-03-29 DIAGNOSIS — D701 Agranulocytosis secondary to cancer chemotherapy: Secondary | ICD-10-CM

## 2016-03-29 DIAGNOSIS — R32 Unspecified urinary incontinence: Secondary | ICD-10-CM

## 2016-03-29 LAB — COMPREHENSIVE METABOLIC PANEL
ALBUMIN: 3.9 g/dL (ref 3.5–5.0)
ALK PHOS: 126 U/L (ref 40–150)
ALT: 13 U/L (ref 0–55)
AST: 17 U/L (ref 5–34)
Anion Gap: 10 mEq/L (ref 3–11)
BILIRUBIN TOTAL: 0.53 mg/dL (ref 0.20–1.20)
BUN: 18.9 mg/dL (ref 7.0–26.0)
CALCIUM: 9.5 mg/dL (ref 8.4–10.4)
CO2: 24 mEq/L (ref 22–29)
CREATININE: 1.3 mg/dL — AB (ref 0.6–1.1)
Chloride: 104 mEq/L (ref 98–109)
EGFR: 41 mL/min/{1.73_m2} — ABNORMAL LOW (ref >90)
Glucose: 130 mg/dl (ref 70–140)
Potassium: 4.3 mEq/L (ref 3.5–5.1)
Sodium: 139 mEq/L (ref 136–145)
TOTAL PROTEIN: 7.6 g/dL (ref 6.4–8.3)

## 2016-03-29 LAB — CBC WITH DIFFERENTIAL/PLATELET
BASO%: 0.3 % (ref 0.0–2.0)
Basophils Absolute: 0 10*3/uL (ref 0.0–0.1)
EOS%: 1.3 % (ref 0.0–7.0)
Eosinophils Absolute: 0.1 10*3/uL (ref 0.0–0.5)
HEMATOCRIT: 36.2 % (ref 34.8–46.6)
HEMOGLOBIN: 12.1 g/dL (ref 11.6–15.9)
LYMPH#: 1.7 10*3/uL (ref 0.9–3.3)
LYMPH%: 23.7 % (ref 14.0–49.7)
MCH: 30.1 pg (ref 25.1–34.0)
MCHC: 33.4 g/dL (ref 31.5–36.0)
MCV: 90 fL (ref 79.5–101.0)
MONO#: 0.7 10*3/uL (ref 0.1–0.9)
MONO%: 9.3 % (ref 0.0–14.0)
NEUT#: 4.6 10*3/uL (ref 1.5–6.5)
NEUT%: 65.4 % (ref 38.4–76.8)
Platelets: 169 10*3/uL (ref 145–400)
RBC: 4.02 10*6/uL (ref 3.70–5.45)
RDW: 14 % (ref 11.2–14.5)
WBC: 7 10*3/uL (ref 3.9–10.3)

## 2016-03-29 LAB — UA PROTEIN, DIPSTICK - CHCC: PROTEIN: NEGATIVE mg/dL

## 2016-03-29 LAB — MAGNESIUM: Magnesium: 1.7 mg/dl (ref 1.5–2.5)

## 2016-03-29 NOTE — Progress Notes (Signed)
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 She has had good response to 7 cycles of CDDP taxol avastin thru 03-01-16. She will have CT CAP 05-15-16 prior to  Dr Josephina Shih on 05-18-16, then Dr Alvy Bimler. Expectation is that she will need systemic treatment ongoing, tho hopefully with some breaks when good response.  Cycle 7 on 03-01-16 was avastin and taxol, with CDDP held due to dehydration from large volume ostomy output related to oral magnesium then. She was not as fatigued for as many days after that treatment, tho still aches and still had to work for adequate po intake. Several days after that chemo, she had an episode of near syncope standing in kitchen, went to floor without injury, seemed better with increased hydration after that. She had no chest pain or SOB, no nausea then. She has had no similar symptoms since then.  She has had no diarrhea since stopping oral magnesium, with stools now formed from ostomy. She denies SOB, cough, chest pain. No abdominal pain or bladder changes, urinary incontinence as baseline.  Energy is good now. No problems with PAC. No bleeding including epistaxis. No LE swelling. No other neurologic symptoms including no peripheral neuropathy. Remainder of 14 point Review of Systems negative.   PAC functioning without difficulty Flu vaccine 12-06-15  ONCOLOGIC HISTORY Patient was in her usual very good health until new cramping abdominal pain intermittently for last few months. She had some constipation with this, particularly severe when she saw PCP for this reason on 04-26-15. She has had no vaginal bleeding. She had no bowel obstruction by xray and bowels moved with miralax. CT AP 04-26-15 had 10.3 x 13.8 mixed cystic and  solid mass involving uterus and extending posteriorly into cul de sac and into bilateral adnexal regions, sigmoid colon tethered to mass, no ascites, scattered necrotic lymph nodes in mesentery and omentum, no hydroureter, 3 lesions in liver up to 3.4 cm, and numerous bilateral pulmonary nodules in lung bases. CEA was 1.1 and CA 125 was 105; CBC and CMET 04-26-15 were entirely normal. 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 She had CT biopsy of pelvic mass in region of left psoas on 3-1-1 7. Pathology [phone], showed high grade poorly differentiated carcinoma with immunohistochemical stains suggesting cervical primary, which did not fit clinical picture well. CT chest 05-10-15 showed multiple bilateral pulmonary nodules (>40), 3 liver lesions up to 3.7 cm, left supraclavicular node and left hilar nodes. Chemotherapy was begun with first carboplatin and taxol on 05-12-15. Admitted with colonic obstruction 05-15-15, diverting sigmoid loop colostomy 05-18-15. Neutropenic 05-19-15 (day 8 cycle 1) despite granix begun day 5. Chemo changed to CDDP taxol with review of the pathology information, cycle 1 delayed due to inadequate IV access on 06-09-15. She had first CDDP taxol on 06-23-15 with on pro neulasta, no documented neutropenia and only mild thrombocytopenia (120k). Avastin was added to CDDP taxol with treatment 07-14-15. CT CAP 12-14-15 compared with 09-2015 improved, with multiple bilateral pulmonary nodules stable in size to decreased,liver lesions are stable to decreased, areaencompassing the uterus and adnexal region decreasedin size, no new or progressive disease .She continued CDDP taxol avastin thru cycle 7 on 03-01-17 (CDDP held cycle 7 with some dehydration that day). Expect restaging CT CAP  05-15-16.  Objective:  Vital signs in last 24 hours:  BP 121/74 (BP Location: Left Arm, Patient Position: Sitting)   Pulse 76   Temp 97.4 F (36.3 C)  (Oral)   Resp 18   Ht 5' 4.5" (1.638 m)   Wt 169 lb (76.7 kg)   SpO2 100%   BMI 28.56 kg/m  Weight up 1 lb Alert, oriented and appropriate, looks entirely comfortable, very pleasant as always. Ambulatory without difficulty.   HEENT:PERRL, sclerae not icteric. Oral mucosa moist without lesions, posterior pharynx clear.  Neck supple. No JVD.  Lymphatics:no cervical,supraclavicular or inguinal adenopathy Resp: clear to auscultation bilaterally and normal percussion bilaterally Cardio: regular rate and rhythm. No gallop. GI: soft, nontender, not distended, no mass or organomegaly. Normally active bowel sounds. Ostomy on left. Musculoskeletal/ Extremities: Back not tender. LE without pitting edema, cords, tenderness Neuro: no peripheral neuropathy. Otherwise nonfocal. PSYCH appropriate mood and affect Skin without rash, ecchymosis, petechiae Portacath-without erythema or tenderness  Lab Results:  Results for orders placed or performed in visit on 03/29/16  CBC with Differential  Result Value Ref Range   WBC 7.0 3.9 - 10.3 10e3/uL   NEUT# 4.6 1.5 - 6.5 10e3/uL   HGB 12.1 11.6 - 15.9 g/dL   HCT 36.2 34.8 - 46.6 %   Platelets 169 145 - 400 10e3/uL   MCV 90.0 79.5 - 101.0 fL   MCH 30.1 25.1 - 34.0 pg   MCHC 33.4 31.5 - 36.0 g/dL   RBC 4.02 3.70 - 5.45 10e6/uL   RDW 14.0 11.2 - 14.5 %   lymph# 1.7 0.9 - 3.3 10e3/uL   MONO# 0.7 0.1 - 0.9 10e3/uL   Eosinophils Absolute 0.1 0.0 - 0.5 10e3/uL   Basophils Absolute 0.0 0.0 - 0.1 10e3/uL   NEUT% 65.4 38.4 - 76.8 %   LYMPH% 23.7 14.0 - 49.7 %   MONO% 9.3 0.0 - 14.0 %   EOS% 1.3 0.0 - 7.0 %   BASO% 0.3 0.0 - 2.0 %  Comprehensive metabolic panel  Result Value Ref Range   Sodium 139 136 - 145 mEq/L   Potassium 4.3 3.5 - 5.1 mEq/L   Chloride 104 98 - 109 mEq/L   CO2 24 22 - 29 mEq/L   Glucose 130 70 - 140 mg/dl   BUN 18.9 7.0 - 26.0 mg/dL   Creatinine 1.3 (H) 0.6 - 1.1 mg/dL   Total Bilirubin 0.53 0.20 - 1.20 mg/dL   Alkaline  Phosphatase 126 40 - 150 U/L   AST 17 5 - 34 U/L   ALT 13 0 - 55 U/L   Total Protein 7.6 6.4 - 8.3 g/dL   Albumin 3.9 3.5 - 5.0 g/dL   Calcium 9.5 8.4 - 10.4 mg/dL   Anion Gap 10 3 - 11 mEq/L   EGFR 41 (L) >90 ml/min/1.73 m2  Urine protein by dipstick  Result Value Ref Range   Protein, ur Negative Negative- <30 mg/dL  Magnesium  Result Value Ref Range   Magnesium 1.7 1.5 - 2.5 mg/dl     Studies/Results:  No results found.  Medications: I have reviewed the patient's current medications. Will be better with IV magnesium if additional supplementation needed.  DISCUSSION Patient understands that this metastatic cancer is not curable, but certainly has been very responsive to systemic treatment, even tho timing of treatments not strictly every 3 weeks to allow beach trips etc. She understands that surgery would not eradicate this metastatic disease, but goal is to keep it controlled  while allowing best quality of life possible. She dislikes chemo tho is willing to continue as appropriate , hopefully will be able to take some treatment breaks when doing well, understands that there are other chemo regimens also.  Will hold treatment now until upcoming scans. She knows to call if any concerns prior.   Note urinary incontinence preceding this diagnosis. She has not wanted urology referral, but will discuss with Dr Josephina Shih.  Assessment/Plan:  1. 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 Clinically doing well, for CTs upcoming to help decide treatment break vs continue treatment now.  She has been reluctant to have CDDP taxol avastin every 3 weeks due to fatigue and other side effects, but is clinically responding to treatment as has been given.Aloxi and emend with other antiemetics, IVF (+ IV antiemetics) after rx if unable to do oral hydration adequately.  Neulasta by on pro injector. 2.post diverting sigmoid loop colostomy 05-18-15 for distal bowel obstruction from pelvic malignancy. Ostomy functioning well .  3.PAC in  4.chemo neutropenia with cycle 1 carbo taxol: on pro neulasta  5.nutritional status now excellent. 6.perirectal HSV:  continue prophylactic acyclovir 7.flu vaccine done 12-06-15 8. No advance directives: She understands goal of treatment is not curative. 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 9. Hypomagnesemia related to CDDP: OK now, Did not tolerate po magnesium with large diarrhea from ostomy 10.chemo peripheral neuropathy improved: Taxol and CDDP. More noticeable in feet. Taxol to 100 mg/m2 better tolerated 11. Low VIt D on supplement by PCP 12.near syncope after most recent chemo: tendency to dehydration after treatment may have been issue. She will discuss also with PCP at next visit. 13.long urinary incontinence: she has declined urology referral, may want to discuss with Dr Josephina Shih.  All questions answered and she is in agreement with plans above. Time spent 20 min including >50% counseling and coordination of care. Route PCP, in EMR for other MDs    Evlyn Clines, MD   03/29/2016, 5:42 PM

## 2016-03-31 ENCOUNTER — Encounter: Payer: Self-pay | Admitting: Oncology

## 2016-04-26 ENCOUNTER — Ambulatory Visit (HOSPITAL_BASED_OUTPATIENT_CLINIC_OR_DEPARTMENT_OTHER): Payer: Medicare HMO

## 2016-04-26 DIAGNOSIS — C787 Secondary malignant neoplasm of liver and intrahepatic bile duct: Secondary | ICD-10-CM

## 2016-04-26 DIAGNOSIS — C801 Malignant (primary) neoplasm, unspecified: Secondary | ICD-10-CM

## 2016-04-26 DIAGNOSIS — C799 Secondary malignant neoplasm of unspecified site: Secondary | ICD-10-CM

## 2016-04-26 DIAGNOSIS — Z452 Encounter for adjustment and management of vascular access device: Secondary | ICD-10-CM | POA: Diagnosis not present

## 2016-04-26 DIAGNOSIS — Z95828 Presence of other vascular implants and grafts: Secondary | ICD-10-CM

## 2016-04-26 DIAGNOSIS — C7989 Secondary malignant neoplasm of other specified sites: Secondary | ICD-10-CM

## 2016-04-26 DIAGNOSIS — C7800 Secondary malignant neoplasm of unspecified lung: Secondary | ICD-10-CM | POA: Diagnosis not present

## 2016-04-26 MED ORDER — SODIUM CHLORIDE 0.9 % IJ SOLN
10.0000 mL | INTRAMUSCULAR | Status: AC | PRN
  Administered 2016-04-26: 10 mL
  Filled 2016-04-26: qty 10

## 2016-04-26 MED ORDER — HEPARIN SOD (PORK) LOCK FLUSH 100 UNIT/ML IV SOLN
500.0000 [IU] | INTRAVENOUS | Status: AC | PRN
  Administered 2016-04-26: 500 [IU]
  Filled 2016-04-26: qty 5

## 2016-05-01 ENCOUNTER — Other Ambulatory Visit: Payer: Self-pay

## 2016-05-01 DIAGNOSIS — C799 Secondary malignant neoplasm of unspecified site: Secondary | ICD-10-CM

## 2016-05-02 DIAGNOSIS — Z933 Colostomy status: Secondary | ICD-10-CM | POA: Diagnosis not present

## 2016-05-02 DIAGNOSIS — Z85038 Personal history of other malignant neoplasm of large intestine: Secondary | ICD-10-CM | POA: Diagnosis not present

## 2016-05-15 ENCOUNTER — Ambulatory Visit (HOSPITAL_COMMUNITY)
Admission: RE | Admit: 2016-05-15 | Discharge: 2016-05-15 | Disposition: A | Discharge status: Home / Self Care | Payer: Medicare HMO | Source: Ambulatory Visit | Attending: Gynecologic Oncology | Admitting: Gynecologic Oncology

## 2016-05-15 DIAGNOSIS — R918 Other nonspecific abnormal finding of lung field: Secondary | ICD-10-CM | POA: Insufficient documentation

## 2016-05-15 DIAGNOSIS — C799 Secondary malignant neoplasm of unspecified site: Secondary | ICD-10-CM

## 2016-05-15 DIAGNOSIS — C787 Secondary malignant neoplasm of liver and intrahepatic bile duct: Secondary | ICD-10-CM | POA: Diagnosis not present

## 2016-05-15 DIAGNOSIS — Z8589 Personal history of malignant neoplasm of other organs and systems: Secondary | ICD-10-CM | POA: Diagnosis not present

## 2016-05-15 DIAGNOSIS — C539 Malignant neoplasm of cervix uteri, unspecified: Secondary | ICD-10-CM | POA: Diagnosis not present

## 2016-05-15 DIAGNOSIS — R1900 Intra-abdominal and pelvic swelling, mass and lump, unspecified site: Secondary | ICD-10-CM | POA: Diagnosis not present

## 2016-05-15 LAB — POCT I-STAT CREATININE: CREATININE: 1.2 mg/dL — AB (ref 0.44–1.00)

## 2016-05-15 MED ORDER — IOPAMIDOL (ISOVUE-300) INJECTION 61%
INTRAVENOUS | Status: AC
  Administered 2016-05-15: 100 mL
  Filled 2016-05-15: qty 100

## 2016-05-18 ENCOUNTER — Encounter: Payer: Self-pay | Admitting: Gynecology

## 2016-05-18 ENCOUNTER — Ambulatory Visit: Payer: Medicare HMO | Attending: Gynecology | Admitting: Gynecology

## 2016-05-18 DIAGNOSIS — M109 Gout, unspecified: Secondary | ICD-10-CM | POA: Diagnosis not present

## 2016-05-18 DIAGNOSIS — R1900 Intra-abdominal and pelvic swelling, mass and lump, unspecified site: Secondary | ICD-10-CM | POA: Insufficient documentation

## 2016-05-18 DIAGNOSIS — K219 Gastro-esophageal reflux disease without esophagitis: Secondary | ICD-10-CM | POA: Insufficient documentation

## 2016-05-18 DIAGNOSIS — K56609 Unspecified intestinal obstruction, unspecified as to partial versus complete obstruction: Secondary | ICD-10-CM | POA: Insufficient documentation

## 2016-05-18 DIAGNOSIS — C539 Malignant neoplasm of cervix uteri, unspecified: Secondary | ICD-10-CM | POA: Diagnosis not present

## 2016-05-18 DIAGNOSIS — R918 Other nonspecific abnormal finding of lung field: Secondary | ICD-10-CM | POA: Insufficient documentation

## 2016-05-18 DIAGNOSIS — D4989 Neoplasm of unspecified behavior of other specified sites: Secondary | ICD-10-CM | POA: Diagnosis not present

## 2016-05-18 DIAGNOSIS — Z933 Colostomy status: Secondary | ICD-10-CM

## 2016-05-18 DIAGNOSIS — M810 Age-related osteoporosis without current pathological fracture: Secondary | ICD-10-CM | POA: Diagnosis not present

## 2016-05-18 DIAGNOSIS — C787 Secondary malignant neoplasm of liver and intrahepatic bile duct: Secondary | ICD-10-CM | POA: Diagnosis not present

## 2016-05-18 NOTE — Progress Notes (Signed)
Consult Note: Gyn-Onc   Katherine Boone 71 y.o. female  Chief Complaint  Patient presents with  . pelvic mass    AssessmentAnd plan   Advance gynecologic cancer histologically consistent with cervical cancer although the entire presentation is not necessarily consistent with cervix cancer. She has had a partial response to carboplatin and Taxol and a Avastin. I discussed with the patient the fact that her CT scan shows worsening disease and that one might consider reinstituting cytotoxic therapy.  If we believe this is likely cervical cancer that alternative therapies might include topotecan or a combination of gemcitabine and cisplatin. I do not believe surgical intervention at this time would be of benefit. The patient return to see Dr. Alvy Bimler to further explore treatment options.  Interval history: Patient returns today having completed 5 cycles of cisplatin Taxol and a Avastin. After 3 cycles she had had an excellent partial response. Overall she tolerated the chemotherapy well. Her functional status is otherwise good. She denies any abdominal or pelvic pain pressure GI or GU symptoms. Her colostomy is functioning well.  Her last chemotherapy was in October 2017. At that time CT scan showed relatively stable disease (partial response). Subsequently she has been off therapy but had a scheduled CT scan for reassessment on 05/15/2016. A CT scan shows a mixed interval response to therapy. Specifically, there is a mild increase in the size of her pulmonary nodules and interval increase in size the pelvic mass (now measuring 6.9 x 9.7 cm previously 5.9 x 9.7 cm) she has further regression of her liver metastases and there is a new soft tissue nodule in the right lower quadrant of the peritoneal cavity.  HPI:71 year old white married female seen in consultation request of Dr.Javier Danise Mina for management of a newly diagnosed advanced carcinoma likely of gynecologic origin.  The patient initially  presented on 04/26/2015 with GI symptoms especially constipation and difficulty having bowel movements and passing gas. In retrospect the patient reports that she's had "pain in her ovaries" since this past summer". CT scan showed the following findings: Numerous bilateral pulmonary nodules consistent with metastatic disease are associated with liver lesions, omental nodules, mesenteric nodules, necrotic retroperitoneal lymphadenopathy in a mixed cystic and solid 10 x 13 cm mass in the central pelvis involving the uterus extending into the cul-de-sac and both adnexal regions.   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 (High-grade poorly differentiated) initial chemotherapy on 05/12/2015 was carboplatin and Taxol. The patient then presented with a colonic obstruction requiring a diverting loop colostomy on 05/18/2015. With pathology indicating an endocervical adenocarcinoma the patient's chemotherapy regimen was switched to cisplatin, Taxol, and a Avastin. After 3 cycles of chemotherapy CT scan demonstrated impressive response and all metastatic sites.   Review of Systems:10 point review of systems is negative except as noted in interval history.   Vitals: There were no vitals taken for this visit.  Physical Exam: General : The patient is a healthy woman in no acute distress.  HEENT: normocephalic, extraoccular movements normal; neck is supple without thyromegally  Lynphnodes: Supraclavicular and inguinal nodes not enlarged  Abdomen: Soft, non-tender, no ascites, no organomegally, no masses, no hernias  Pelvic:  EGBUS: Normal female  Vagina: Normal, no lesions  Urethra and Bladder: Normal, non-tender  Cervix: Normal  Uterus: Anterior normal shape size and consistency Bi-manual examination: Non-tender; no adenxal masses or nodularity  Rectal: normal sphincter tone, no masses, no blood  Lower extremities: No  edema or varicosities. Normal range of motion      No Known Allergies  Past Medical History:  Diagnosis Date  . Cancer (HCC)    Cervical  . Family history of cancer    stomach, colon, lung, throat  . GERD (gastroesophageal reflux disease)    diet controlled  . Gout 03/2005   ?  Marland Kitchen Large bowel obstruction 05/28/2015  . Migraines    none recently  . Osteoporosis 02/2011   DEXA 12/12 - Lspine -2.6; femur -3.8  . Small bowel obstruction     Past Surgical History:  Procedure Laterality Date  . APPENDECTOMY  1960  . LAPAROSCOPIC DIVERTED COLOSTOMY N/A 05/18/2015   Procedure: LAPAROSCOPIC DIVERTED LOOP COLOSTOMY ;  Surgeon: Leighton Ruff, MD;  Location: [facility];  Service: General;  Laterality: N/A;    Current Outpatient Prescriptions  Medication Sig Dispense Refill  . cholecalciferol (VITAMIN D) 1000 units tablet Take 1,000 Units by mouth daily.    Marland Kitchen omeprazole (PRILOSEC) 20 MG capsule Take 1 capsule (20 mg total) by mouth daily. (Patient taking differently: Take 20 mg by mouth daily as needed. ) 30 capsule 3   No current facility-administered medications for this visit.    Facility-Administered Medications Ordered in Other Visits  Medication Dose Route Frequency Provider Last Rate Last Dose  . heparin lock flush 100 unit/mL  500 Units Intravenous Once Lennis P Livesay, MD      . sodium chloride flush (NS) 0.9 % injection 10 mL  10 mL Intravenous PRN Lennis Marion Downer, MD        Social History   Social History  . Marital status: Divorced    Spouse name: N/A  . Number of children: 2  . Years of education: N/A   Occupational History  .  [employer]   Social History Main Topics  . Smoking status: Never Smoker  . Smokeless tobacco: Never Used  . Alcohol use No  . Drug use: No  . Sexual activity: No   Other Topics Concern  . 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 Heart disease Mother   . Hypertension Mother   . Cancer Mother 11    colon and stomach  . Cancer Brother     stomach and throat, smoker  . Hypertension Sister   . Cancer Brother     stomach and lung, smoker  . COPD Brother   . Cancer Father     stomach cancer  . Ovarian cancer      maternal neice  . Throat cancer Brother   . Stroke Sister   . Cancer Other     niece - stomach cancer  . Diabetes Neg Hx   . Coronary artery disease Neg Hx       Marti Sleigh, MD 05/18/2016, 11:51 AM       Consult Note: Gyn-Onc   Silvana Newness 71 y.o. female  Chief Complaint  Patient presents with  . pelvic mass    Assessment :  Plan:  Interval History:   HPI:  Review of Systems:10 point review of systems is negative except as noted in interval history.   Vitals: There were no vitals taken for this visit.  Physical Exam: General : The patient is a healthy woman in no acute distress.  HEENT: normocephalic, extraoccular movements normal; neck is supple without thyromegally  Lynphnodes: Supraclavicular and inguinal nodes not enlarged  Abdomen: Soft, non-tender, no ascites, no organomegally, no masses, no hernias  Pelvic:  EGBUS: Normal female  Vagina: Normal, no lesions  Urethra and Bladder: Normal, non-tender  Cervix: Surgically absent  Uterus: Surgically absent  Bi-manual examination: Non-tender; no adenxal masses or nodularity  Rectal: normal sphincter tone, no masses, no blood  Lower extremities: No edema or varicosities. Normal range of motion      No Known Allergies  Past Medical History:  Diagnosis Date  . Cancer (HCC)    Cervical  .  Family history of cancer    stomach, colon, lung, throat  . GERD (gastroesophageal reflux disease)    diet controlled  . Gout 03/2005   ?  Marland Kitchen Large bowel obstruction 05/28/2015  . Migraines    none recently  . Osteoporosis 02/2011   DEXA 12/12 - Lspine -2.6; femur -3.8  . Small bowel obstruction     Past Surgical History:  Procedure Laterality Date  . APPENDECTOMY  1960  . LAPAROSCOPIC DIVERTED COLOSTOMY N/A 05/18/2015   Procedure: LAPAROSCOPIC DIVERTED LOOP COLOSTOMY ;  Surgeon: Leighton Ruff, MD;  Location: [facility];  Service: General;  Laterality: N/A;    Current Outpatient Prescriptions  Medication Sig Dispense Refill  . cholecalciferol (VITAMIN D) 1000 units tablet Take 1,000 Units by mouth daily.    Marland Kitchen omeprazole (PRILOSEC) 20 MG capsule Take 1 capsule (20 mg total) by mouth daily. (Patient taking differently: Take 20 mg by mouth daily as needed. ) 30 capsule 3   No current facility-administered medications for this visit.    Facility-Administered Medications Ordered in Other Visits  Medication Dose Route Frequency Provider Last Rate Last Dose  . heparin lock flush 100 unit/mL  500 Units Intravenous Once Lennis P Livesay, MD      . sodium chloride flush (NS) 0.9 % injection 10 mL  10 mL Intravenous PRN Lennis Marion Downer, MD        Social History   Social History  . Marital status: Divorced    Spouse name: N/A  . Number of children: 2  . Years of education: N/A   Occupational History  .  [employer]   Social History Main Topics  . Smoking status: Never Smoker  . Smokeless tobacco: Never Used  . Alcohol use No  . Drug use: No  . Sexual activity: No   Other Topics Concern  . 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 Heart disease Mother   . Hypertension Mother   . Cancer Mother 35    colon and stomach  . Cancer Brother     stomach and throat, smoker  .  Hypertension Sister   . Cancer Brother     stomach and lung, smoker  . COPD Brother   . Cancer Father     stomach cancer  . Ovarian cancer      maternal neice  . Throat cancer Brother   . Stroke Sister   . Cancer Other     niece - stomach cancer  . Diabetes Neg Hx   . Coronary artery disease Neg Hx       Marti Sleigh, MD 05/18/2016, 11:51 AM

## 2016-05-21 ENCOUNTER — Telehealth: Payer: Self-pay | Admitting: Hematology and Oncology

## 2016-05-21 ENCOUNTER — Ambulatory Visit (HOSPITAL_BASED_OUTPATIENT_CLINIC_OR_DEPARTMENT_OTHER): Payer: Medicare HMO | Admitting: Hematology and Oncology

## 2016-05-21 DIAGNOSIS — Z7189 Other specified counseling: Secondary | ICD-10-CM | POA: Diagnosis not present

## 2016-05-21 DIAGNOSIS — G620 Drug-induced polyneuropathy: Secondary | ICD-10-CM | POA: Diagnosis not present

## 2016-05-21 DIAGNOSIS — C799 Secondary malignant neoplasm of unspecified site: Secondary | ICD-10-CM

## 2016-05-21 DIAGNOSIS — C7802 Secondary malignant neoplasm of left lung: Secondary | ICD-10-CM | POA: Diagnosis not present

## 2016-05-21 DIAGNOSIS — C539 Malignant neoplasm of cervix uteri, unspecified: Secondary | ICD-10-CM | POA: Diagnosis not present

## 2016-05-21 DIAGNOSIS — C7801 Secondary malignant neoplasm of right lung: Secondary | ICD-10-CM

## 2016-05-21 DIAGNOSIS — C787 Secondary malignant neoplasm of liver and intrahepatic bile duct: Secondary | ICD-10-CM

## 2016-05-21 DIAGNOSIS — T451X5A Adverse effect of antineoplastic and immunosuppressive drugs, initial encounter: Secondary | ICD-10-CM

## 2016-05-21 NOTE — Progress Notes (Signed)
START OFF PATHWAY REGIMEN - [Other Dx]   [id number]:Gemcitabine + Cisplatin every 21 days:   A cycle is every 21 days:     Gemcitabine      Cisplatin   **Always confirm dose/schedule in your pharmacy ordering system**    Intent of Therapy: Non-Curative / Palliative Intent, Discussed with Patient

## 2016-05-21 NOTE — Telephone Encounter (Signed)
Gave patient avs report and appointments for April  °

## 2016-05-22 DIAGNOSIS — Z515 Encounter for palliative care: Secondary | ICD-10-CM | POA: Insufficient documentation

## 2016-05-22 NOTE — Assessment & Plan Note (Signed)
She has trace peripheral neuropathy from prior treatment. I plan to reduce the dose of cisplatin as above 

## 2016-05-22 NOTE — Assessment & Plan Note (Signed)
I reviewed the guidelines with the patient extensively. She has only received 1 dose of carboplatin and paclitaxel.  Subsequently, she had significant symptoms leading to emergency surgery. She had received numerous doses of Taxol and cisplatin complicated by peripheral neuropathy which has since improved. She had partial response to combination therapy with cisplatin, paclitaxel and Avastin. Since discontinuation of treatment 3 months ago, she has evidence of disease relapse although she remained relatively asymptomatic. I discussed the current guidelines and review with her several options of treatment. Since the patient is feeling relatively well, I think she should be able to tolerate combination chemotherapy. I explained to her combination chemotherapy will give possibility of better response to treatment but of course that would be at the expense of potential more side effects. The decision was made based on recommendation from NCCN.  Gynecol Oncol. 2006 Feb;100(2):385-8. Epub 2005 Nov 4. Cisplatin plus gemcitabine in previously treated squamous cell carcinoma of the cervix: a phase II study of the Gynec[employer] Oncology Group. [employer], Blessing JA, Nagourney RA, McMeekin DS, [place], [place].  OBJECTIVES:  This trial was conducted to evaluate the safety and efficacy of cisplatin plus gemcitabine in previously treated squamous cell carcinoma of the cervix. SUBJECTS AND METHODS:  All women had measurable histologically confirmed squamous cell cervical cancer and a GOG performance status less than or equal to 2. The women were to receive cisplatin at 30 mg/m(2) plus gemcitabine at 800 mg/m(2) day 1 and day 8 every 28 days. RESULTS:  Between February 2001 and May 2002, 32 eligible patients were entered. All women had received prior chemotherapy and 29 had received radiation. Twenty patients received platinum previously twice. The median time from primary treatment to recurrence was 21  months, but the median time from last prior chemotherapy was less than 2 months. A second phase of accrual was not indicated per the established stopping rules. There were 7 (21.9%) partial responses and median response duration was 2.1 months. Twelve additional women (37.5%) had stable disease. Nine women (28.1%) had increasing disease. Median time to progression was 3.5 months. There were no treatment-related deaths. Six women had grade 4 neutropenia, three had grade 4 anemia, and two had grade 4 thrombocytopenia. Grade 4 gastrointestinal toxicity occurred in two women and grade 4 anorexia occurred in one. CONCLUSIONS:  This study suggests modest activity for the gemcitabine plus cisplatin doublet in previously treated squamous cell carcinoma of the cervix. The objective response rate of 22% is comparable to that of other active agents and combinations tested in this setting. Toxicities were primarily hematologic and generally manageable with dose reductions.  We discussed the role of chemotherapy. The intent is strictly palliative  We discussed some of the risks, benefits, side-effects of cisplatin & gemcitabine.  Some of the short term side-effects included, though not limited to, including weight loss, life threatening infections, risk of allergic reactions, need for transfusions of blood products, nausea, vomiting, change in bowel habits, loss of hair, admission to hospital for various reasons, and risks of death.   Long term side-effects are also discussed including risks of infertility, permanent damage to nerve function, hearing loss, chronic fatigue, kidney damage with possibility needing hemodialysis, and rare secondary malignancy including bone marrow disorders.  The patient is aware that the response rates discussed earlier is not guaranteed.  After a long discussion, patient made an informed decision to proceed with the prescribed plan of care.   Patient education material was  dispensed Given her prior experience with cisplatin  and residual peripheral neuropathy, I plan to reduce the recommended treatment dose of 30 mg/m, I would reduce it to 25 mg/m and to keep gemcitabine at 800 mg/m. She will get treatment on day 1 and day 8 and rest day 15 for cycle of every 21 days. The patient wants to start treatment around April 5 to accommodate for her planned vacation and I think that is reasonable.

## 2016-05-22 NOTE — Assessment & Plan Note (Signed)
I cannot see the liver metastasis anymore.  She responded to recent treatment with normal liver function

## 2016-05-22 NOTE — Progress Notes (Signed)
[place] progress notes  Patient Care Team: [name], MD as PCP - General (Family Medicine) Gordy Levan, MD as Consulting Physician (Oncology) Carmel Sacramento, OD as Consulting Physician (Optometry)  CHIEF COMPLAINTS/PURPOSE OF VISIT:  Recurrent metastatic cervical cancer to the lungs and liver  HISTORY OF PRESENTING ILLNESS:  Katherine Boone 71 y.o. female was transferred to my care after her prior physician has left.  I reviewed the patient's records extensive and collaborated the history with the patient. Summary of her history is as follows:   Cervical cancer ([place])   04/26/2015 Imaging    Numerous bilateral pulmonary nodules consistent with metastatic disease are associated with liver lesions, omental nodules, mesenteric nodules, necrotic retroperitoneal lymphadenopathy in a mixed cystic and solid mass in the central pelvis involving the uterus extending into the cul-de-sac and both adnexal regions. Uterine primary (likely endometrial) is favored over an ovarian primary malignancy given the lack of ascites, associated intrahepatic metastases and pulmonary involvement.      05/04/2015 Procedure    Technically successful CT-guided core biopsy of left pelvic mass      05/04/2015 Pathology Results    Soft Tissue Needle Core Biopsy, left ext iliac mass METASTATIC HIGH GRADE POORLY DIFFERENTIATED CARCINOMA CONSISTENT WITH CERVICAL ORIGIN Microscopic Comment The neoplasm stains positive for cervical markers : p16, ER, ck8/18, and p63 (focal), TTF-1 (focal) and negative for synaptophysin, Chromogranin (neuroendocrine markers) and ck5/6. The morphology and immunohistochemistry staining pattern support these cells are GYN cervical origin.Spec      05/10/2015 Imaging    Innumerable (> than 40) pulmonary nodules randomly distributed throughout both lungs, largest 1.1 cm, in keeping with pulmonary metastases. 2. Left supraclavicular and left hilar nodal  metastases. 3. Re- demonstration of liver metastases. 4. Right thyroid lobe 1.4 cm pulmonary nodule, for which no further evaluation is recommended. 5. Stable mild superior T11 vertebral compression deformity.      05/12/2015 - 05/12/2015 Chemotherapy    She received 1 dose of carbo/taxol only      05/15/2015 Imaging    Widely metastatic cervical carcinoma with unchanged appearance compared to 04/26/2015. Bulky tumor narrows and likely invades the rectum/distal sigmoid with progressive proximal stool retention      05/15/2015 - 05/23/2015 Hospital Admission    She was admitted for severe bowel obstruction requiring urgent diversion colostomy      05/18/2015 Surgery    She underwent LAPAROSCOPIC DIVERTING SIGMOID LOOP COLOSTOMY for bowel obstruction       06/10/2015 Procedure    Successful placement of a right internal jugular approach power injectable Port-A-Cath. The catheter is ready for immediate use      06/23/2015 - 03/01/2016 Chemotherapy    She received combination treatment with cisplatin, Taxol and Avastin. Avastin was started on 5/11. Dose of Taxol was modified due to neuropathy and cisplatin was omited last dose due to 'weakness'      09/13/2015 Imaging    Significant interval treatment response. Pulmonary and liver metastases have significantly decreased in size. Thoracic adenopathy has decreased in size. Mixed attenuation mass encompassing the uterus and bilateral adnexal regions has significantly decreased in size. No residual visualized peritoneal tumor implants. No new or progressive metastatic disease. 2. No evidence of bowel obstruction or acute bowel inflammation status post diverting sigmoid colostomy. 3. Additional findings include mild aortic atherosclerosis and Stable mild chronic superior T12 compression fracture.      12/14/2015 Imaging    Stable to improved interval exam. Multiple bilateral pulmonary nodules  are stable in size to decreased and the liver lesions are  stable to decreased. Mixed attenuation lesion encompassing the uterus and adnexal region shows decrease in size. There is no evidence for new or progressive disease on today's exam.      05/15/2016 Imaging    Findings today reflecting mixed interval response to therapy. 2. There has been mild increase in size of pulmonary nodules. 3. Interval increase in size of pelvic mass 4. Further regression of liver metastases 5. There is a new soft tissue nodule within the right lower quadrant peritoneal cavity. Suspicious peritoneal disease      She feels well. She denies pelvic discomfort.  Denies chest pain or shortness of breath. She has very minor hearing deficit. She has mild peripheral neuropathy with residual tingling sensation from prior treatment but it is improving. She is managing colostomy well. The patient denies any recent signs or symptoms of bleeding such as spontaneous epistaxis, hematuria or hematochezia.   MEDICAL HISTORY:  Past Medical History:  Diagnosis Date  . Cancer (HCC)    Cervical  . Family history of cancer    stomach, colon, lung, throat  . GERD (gastroesophageal reflux disease)    diet controlled  . Gout 03/2005   ?  Marland Kitchen Large bowel obstruction 05/28/2015  . Migraines    none recently  . Osteoporosis 02/2011   DEXA 12/12 - Lspine -2.6; femur -3.8  . Small bowel obstruction     SURGICAL HISTORY: Past Surgical History:  Procedure Laterality Date  . APPENDECTOMY  1960  . LAPAROSCOPIC DIVERTED COLOSTOMY N/A 05/18/2015   Procedure: LAPAROSCOPIC DIVERTED LOOP COLOSTOMY ;  Surgeon: Leighton Ruff, MD;  Location: [facility];  Service: General;  Laterality: N/A;    SOCIAL HISTORY: Social History   Social History  . Marital status: Divorced    Spouse name: N/A  . Number of children: 2  . Years of education: N/A   Occupational History  [place]   Social History Main Topics  . Smoking status: Never Smoker  . Smokeless tobacco: Never Used  . Alcohol  use No  . Drug use: No  . Sexual activity: No   Other Topics Concern  . 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 Heart disease Mother   . Hypertension Mother   . Cancer Mother 41    colon and stomach  . Cancer Brother     stomach and throat, smoker  . Hypertension Sister   . Cancer Brother     stomach and lung, smoker  . COPD Brother   . Cancer Father     stomach cancer  . Ovarian cancer      maternal neice  . Throat cancer Brother   . Stroke Sister   . Cancer Other     niece - stomach cancer  . Diabetes Neg Hx   . Coronary artery disease Neg Hx     ALLERGIES:  has No Known Allergies.  MEDICATIONS:  Current Outpatient Prescriptions  Medication  Sig Dispense Refill  . cholecalciferol (VITAMIN D) 1000 units tablet Take 1,000 Units by mouth daily.    Marland Kitchen omeprazole (PRILOSEC) 20 MG capsule Take 1 capsule (20 mg total) by mouth daily. (Patient taking differently: Take 20 mg by mouth daily as needed. ) 30 capsule 3  . temazepam (RESTORIL) 15 MG capsule Take 15 mg by mouth at bedtime as needed for sleep.     No current facility-administered medications for this visit.    Facility-Administered Medications Ordered in Other Visits  Medication Dose Route Frequency Provider Last Rate Last Dose  . heparin lock flush 100 unit/mL  500 Units Intravenous Once Lennis P Livesay, MD      . sodium chloride flush (NS) 0.9 % injection 10 mL  10 mL Intravenous PRN Lennis Marion Downer, MD        REVIEW OF SYSTEMS:   Constitutional: Denies fevers, chills or abnormal night  sweats Eyes: Denies blurriness of vision, double vision or watery eyes Ears, nose, mouth, throat, and face: Denies mucositis or sore throat Respiratory: Denies cough, dyspnea or wheezes Cardiovascular: Denies palpitation, chest discomfort or lower extremity swelling Gastrointestinal:  Denies nausea, heartburn or change in bowel habits Skin: Denies abnormal skin rashes Lymphatics: Denies new lymphadenopathy or easy bruising Neurological:Denies numbness, tingling or new weaknesses Behavioral/Psych: Mood is stable, no new changes  All other systems were reviewed with the patient and are negative.  PHYSICAL EXAMINATION: ECOG PERFORMANCE STATUS: 0 - Asymptomatic  Vitals:   05/21/16 1347  BP: 129/65  Pulse: 80  Resp: 18  Temp: 98.2 F (36.8 C)   Filed Weights   05/21/16 1347  Weight: 171 lb 4.8 oz (77.7 kg)    GENERAL:alert, no distress and comfortable SKIN: skin color, texture, turgor are normal, no rashes or significant lesions EYES: normal, conjunctiva are pink and non-injected, sclera clear OROPHARYNX:no exudate, normal lips, buccal mucosa, and tongue  NECK: supple, thyroid normal size, non-tender, without nodularity LYMPH:  no palpable lymphadenopathy in the cervical, axillary or inguinal LUNGS: clear to auscultation and percussion with normal breathing effort HEART: regular rate & rhythm and no murmurs without lower extremity edema ABDOMEN:abdomen soft, non-tender and normal bowel sounds.  Well-healed surgical scar.  Colostomy in situ Musculoskeletal:no cyanosis of digits and no clubbing  PSYCH: alert & oriented x 3 with fluent speech NEURO: no focal motor/sensory deficits  LABORATORY DATA:  I have reviewed the data as listed Lab Results  Component Value Date   WBC 7.0 03/29/2016   HGB 12.1 03/29/2016   HCT 36.2 03/29/2016   MCV 90.0 03/29/2016   PLT 169 03/29/2016    Recent Labs  01/19/16 0847 03/01/16 0815 03/29/16 1440 05/15/16 1130  NA 140 137 139  --    K 4.6 4.4 4.3  --   CO2 22 20* 24  --   GLUCOSE 87 249* 130  --   BUN 18.0 19.6 18.9  --   CREATININE 1.0 1.3* 1.3* 1.20*  CALCIUM 9.1 9.7 9.5  --   PROT 7.1 7.9 7.6  --   ALBUMIN 3.5 3.8 3.9  --   AST 15 18 17   --   ALT 15 20 13   --   ALKPHOS 124 149 126  --   BILITOT 0.38 0.43 0.53  --     RADIOGRAPHIC STUDIES: I reviewed imaging study in great detail with the patient I have personally reviewed the radiological images as listed and agreed with the findings in the report. Ct Chest W Contrast  Result Date: 05/15/2016 CLINICAL DATA:  Malignant neoplasm of the cervix with metastases. EXAM: CT CHEST, ABDOMEN, AND PELVIS WITH CONTRAST TECHNIQUE: Multidetector CT imaging of the chest, abdomen and pelvis was performed following the standard protocol during bolus administration of intravenous contrast. CONTRAST:  1 ISOVUE-300 IOPAMIDOL (ISOVUE-300) INJECTION 61% COMPARISON:  12/14/2015 FINDINGS: CT CHEST FINDINGS Cardiovascular: Heart size is normal.  No pericardial effusion. Mediastinum/Nodes: The trachea appears patent and is midline. Normal appearance of the esophagus. No enlarged axillary or supraclavicular lymph nodes. No mediastinal or hilar adenopathy. Lungs/Pleura: No pleural effusion. Multiple pulmonary nodules are identified in both lungs. Index right middle lobe pulmonary nodule is stable measuring 6 mm. There is a a left upper lobe perihilar nodule which measures 1.4 cm, image 67 of series 4. Previously this measured 5 mm. Index nodule within the anterior right lung base measures 1 cm, image 98 of series 4. Previously 7 mm. Index nodule within the left lower lobe measures 8 mm, image 99 of series 4. Previously 6 mm. Musculoskeletal: There are no aggressive lytic or sclerotic bone lesions. Sclerotic lesion in the left scapula is unchanged and may represent a large bone island. CT ABDOMEN PELVIS FINDINGS Hepatobiliary: Segment 4A index lesion is no longer measurable. No new liver lesions.  Gallbladder is normal. No biliary dilatation. Pancreas: Unremarkable. No pancreatic ductal dilatation or surrounding inflammatory changes. Spleen: Normal in size without focal abnormality. Adrenals/Urinary Tract: Normal appearance of the adrenal glands. Left kidney cyst is unchanged measuring 1.2 cm, image 69 of series 2. No mass or hydronephrosis. The urinary bladder appears normal. Stomach/Bowel: The stomach appears normal. The small bowel loops have a normal course and caliber. No obstruction. Left lower quadrant double barrel colostomy is identified. Vascular/Lymphatic: Normal appearance of the abdominal aorta. No there is no abdominal or pelvic adenopathy. No inguinal adenopathy. Reproductive: The mixed attenuation mass encompassing the uterus and both adnexal regions is again identified. Using the same measuring scheme as previously this currently measures 6.9 by 9.7 cm, image 108 of series 2. Previously 5.9 x 9.7 cm. Other: No ascites. No focal fluid collections. There is a soft tissue attenuating nodule within the right lower quadrant of the abdomen measuring 1.3 cm, image 88 of series 2. Not seen on previous examination. Musculoskeletal: The bones are diffusely osteopenic. No aggressive lytic or sclerotic bone lesions. IMPRESSION: 1. Findings today reflecting mixed interval response to therapy. 2. There has been mild increase in size of pulmonary nodules. 3. Interval increase in size of pelvic mass 4. Further regression of liver metastases 5. There is a new soft tissue nodule within the right lower quadrant peritoneal cavity. Suspicious peritoneal disease. Electronically Signed   By: Kerby Moors M.D.   On: 05/15/2016 13:43   Ct Abdomen Pelvis W Contrast  Result Date: 05/15/2016 CLINICAL DATA:  Malignant neoplasm of the cervix with metastases. EXAM: CT CHEST, ABDOMEN, AND PELVIS WITH CONTRAST TECHNIQUE: Multidetector CT imaging of the chest, abdomen and pelvis was performed following the standard  protocol during bolus administration of intravenous contrast. CONTRAST:  1 ISOVUE-300 IOPAMIDOL (ISOVUE-300) INJECTION 61% COMPARISON:  12/14/2015 FINDINGS: CT CHEST FINDINGS Cardiovascular: Heart size is normal.  No pericardial effusion. Mediastinum/Nodes: The trachea appears patent and is midline. Normal appearance of the esophagus. No enlarged axillary or supraclavicular lymph nodes. No mediastinal or hilar adenopathy. Lungs/Pleura: No pleural effusion. Multiple pulmonary nodules are identified in both lungs. Index right middle lobe pulmonary nodule is stable measuring 6 mm. There is a a left upper lobe perihilar  nodule which measures 1.4 cm, image 67 of series 4. Previously this measured 5 mm. Index nodule within the anterior right lung base measures 1 cm, image 98 of series 4. Previously 7 mm. Index nodule within the left lower lobe measures 8 mm, image 99 of series 4. Previously 6 mm. Musculoskeletal: There are no aggressive lytic or sclerotic bone lesions. Sclerotic lesion in the left scapula is unchanged and may represent a large bone island. CT ABDOMEN PELVIS FINDINGS Hepatobiliary: Segment 4A index lesion is no longer measurable. No new liver lesions. Gallbladder is normal. No biliary dilatation. Pancreas: Unremarkable. No pancreatic ductal dilatation or surrounding inflammatory changes. Spleen: Normal in size without focal abnormality. Adrenals/Urinary Tract: Normal appearance of the adrenal glands. Left kidney cyst is unchanged measuring 1.2 cm, image 69 of series 2. No mass or hydronephrosis. The urinary bladder appears normal. Stomach/Bowel: The stomach appears normal. The small bowel loops have a normal course and caliber. No obstruction. Left lower quadrant double barrel colostomy is identified. Vascular/Lymphatic: Normal appearance of the abdominal aorta. No there is no abdominal or pelvic adenopathy. No inguinal adenopathy. Reproductive: The mixed attenuation mass encompassing the uterus and both  adnexal regions is again identified. Using the same measuring scheme as previously this currently measures 6.9 by 9.7 cm, image 108 of series 2. Previously 5.9 x 9.7 cm. Other: No ascites. No focal fluid collections. There is a soft tissue attenuating nodule within the right lower quadrant of the abdomen measuring 1.3 cm, image 88 of series 2. Not seen on previous examination. Musculoskeletal: The bones are diffusely osteopenic. No aggressive lytic or sclerotic bone lesions. IMPRESSION: 1. Findings today reflecting mixed interval response to therapy. 2. There has been mild increase in size of pulmonary nodules. 3. Interval increase in size of pelvic mass 4. Further regression of liver metastases 5. There is a new soft tissue nodule within the right lower quadrant peritoneal cavity. Suspicious peritoneal disease. Electronically Signed   By: Kerby Moors M.D.   On: 05/15/2016 13:43    ASSESSMENT & PLAN:  Cervical cancer ([place]) I reviewed the guidelines with the patient extensively. She has only received 1 dose of carboplatin and paclitaxel.  Subsequently, she had significant symptoms leading to emergency surgery. She had received numerous doses of Taxol and cisplatin complicated by peripheral neuropathy which has since improved. She had partial response to combination therapy with cisplatin, paclitaxel and Avastin. Since discontinuation of treatment 3 months ago, she has evidence of disease relapse although she remained relatively asymptomatic. I discussed the current guidelines and review with her several options of treatment. Since the patient is feeling relatively well, I think she should be able to tolerate combination chemotherapy. I explained to her combination chemotherapy will give possibility of better response to treatment but of course that would be at the expense of potential more side effects. The decision was made based on recommendation from NCCN.  Gynecol Oncol. 2006 Feb;100(2):385-8.  Epub 2005 Nov 4. Cisplatin plus gemcitabine in previously treated squamous cell carcinoma of the cervix: a phase II study of the Gynecologic Oncology Group. [employer], Blessing JA, Nagourney RA, McMeekin DS, Lugoff, [place].  OBJECTIVES:  This trial was conducted to evaluate the safety and efficacy of cisplatin plus gemcitabine in previously treated squamous cell carcinoma of the cervix. SUBJECTS AND METHODS:  All women had measurable histologically confirmed squamous cell cervical cancer and a GOG performance status less than or equal to 2. The women were to receive cisplatin at 30 mg/m(2)  plus gemcitabine at 800 mg/m(2) day 1 and day 8 every 28 days. RESULTS:  Between February 2001 and May 2002, 32 eligible patients were entered. All women had received prior chemotherapy and 29 had received radiation. Twenty patients received platinum previously twice. The median time from primary treatment to recurrence was 21 months, but the median time from last prior chemotherapy was less than 2 months. A second phase of accrual was not indicated per the established stopping rules. There were 7 (21.9%) partial responses and median response duration was 2.1 months. Twelve additional women (37.5%) had stable disease. Nine women (28.1%) had increasing disease. Median time to progression was 3.5 months. There were no treatment-related deaths. Six women had grade 4 neutropenia, three had grade 4 anemia, and two had grade 4 thrombocytopenia. Grade 4 gastrointestinal toxicity occurred in two women and grade 4 anorexia occurred in one. CONCLUSIONS:  This study suggests modest activity for the gemcitabine plus cisplatin doublet in previously treated squamous cell carcinoma of the cervix. The objective response rate of 22% is comparable to that of other active agents and combinations tested in this setting. Toxicities were primarily hematologic and generally manageable with dose reductions.  We discussed the role of  chemotherapy. The intent is strictly palliative  We discussed some of the risks, benefits, side-effects of cisplatin & gemcitabine.  Some of the short term side-effects included, though not limited to, including weight loss, life threatening infections, risk of allergic reactions, need for transfusions of blood products, nausea, vomiting, change in bowel habits, loss of hair, admission to hospital for various reasons, and risks of death.   Long term side-effects are also discussed including risks of infertility, permanent damage to nerve function, hearing loss, chronic fatigue, kidney damage with possibility needing hemodialysis, and rare secondary malignancy including bone marrow disorders.  The patient is aware that the response rates discussed earlier is not guaranteed.  After a long discussion, patient made an informed decision to proceed with the prescribed plan of care.   Patient education material was dispensed Given her prior experience with cisplatin and residual peripheral neuropathy, I plan to reduce the recommended treatment dose of 30 mg/m, I would reduce it to 25 mg/m and to keep gemcitabine at 800 mg/m. She will get treatment on day 1 and day 8 and rest day 15 for cycle of every 21 days. The patient wants to start treatment around April 5 to accommodate for her planned vacation and I think that is reasonable.    Chemotherapy-induced peripheral neuropathy ([place]) She has trace peripheral neuropathy from prior treatment. I plan to reduce the dose of cisplatin as above  Malignant neoplasm metastatic to left lung [facility]) She is not symptomatic from lung metastasis  Metastases to the liver [facility]) I cannot see the liver metastasis anymore.  She responded to recent treatment with normal liver function  Goals of care, counseling/discussion The patient is aware she has incurable disease and treatment is strictly palliative. We discussed importance of Advanced Directives and Living  will. She apparently had an advanced directive at home but has not discussed this with her next of kin I encouraged her to discuss with family members   No orders of the defined types were placed in this encounter.   All questions were answered. The patient knows to call the clinic with any problems, questions or concerns. I spent 45 minutes counseling the patient face to face. The total time spent in the appointment was 80 minutes and more than 50% was on  counseling.     Heath Lark, MD 05/22/2016 7:16 AM

## 2016-05-22 NOTE — Assessment & Plan Note (Signed)
She is not symptomatic from lung metastasis 

## 2016-05-22 NOTE — Assessment & Plan Note (Signed)
The patient is aware she has incurable disease and treatment is strictly palliative. We discussed importance of Advanced Directives and Living will. She apparently had an advanced directive at home but has not discussed this with her next of kin I encouraged her to discuss with family members

## 2016-05-25 ENCOUNTER — Telehealth: Payer: Self-pay | Admitting: Hematology and Oncology

## 2016-05-25 NOTE — Telephone Encounter (Signed)
Called patient to inform her of next scheduled appointments.  

## 2016-06-07 ENCOUNTER — Ambulatory Visit: Payer: Medicare HMO

## 2016-06-07 ENCOUNTER — Other Ambulatory Visit: Payer: Self-pay | Admitting: Hematology and Oncology

## 2016-06-07 ENCOUNTER — Ambulatory Visit (HOSPITAL_BASED_OUTPATIENT_CLINIC_OR_DEPARTMENT_OTHER): Payer: Medicare HMO

## 2016-06-07 ENCOUNTER — Other Ambulatory Visit (HOSPITAL_BASED_OUTPATIENT_CLINIC_OR_DEPARTMENT_OTHER): Payer: Medicare HMO

## 2016-06-07 VITALS — BP 121/66 | HR 65 | Temp 98.3°F | Resp 17

## 2016-06-07 DIAGNOSIS — C7802 Secondary malignant neoplasm of left lung: Secondary | ICD-10-CM

## 2016-06-07 DIAGNOSIS — C539 Malignant neoplasm of cervix uteri, unspecified: Secondary | ICD-10-CM

## 2016-06-07 DIAGNOSIS — C7801 Secondary malignant neoplasm of right lung: Secondary | ICD-10-CM

## 2016-06-07 DIAGNOSIS — Z5111 Encounter for antineoplastic chemotherapy: Secondary | ICD-10-CM | POA: Diagnosis not present

## 2016-06-07 DIAGNOSIS — C799 Secondary malignant neoplasm of unspecified site: Secondary | ICD-10-CM

## 2016-06-07 LAB — CBC WITH DIFFERENTIAL/PLATELET
BASO%: 0.9 % (ref 0.0–2.0)
Basophils Absolute: 0 10*3/uL (ref 0.0–0.1)
EOS ABS: 0.1 10*3/uL (ref 0.0–0.5)
EOS%: 1.5 % (ref 0.0–7.0)
HEMATOCRIT: 35.4 % (ref 34.8–46.6)
HEMOGLOBIN: 12.1 g/dL (ref 11.6–15.9)
LYMPH%: 28 % (ref 14.0–49.7)
MCH: 30 pg (ref 25.1–34.0)
MCHC: 34.1 g/dL (ref 31.5–36.0)
MCV: 88 fL (ref 79.5–101.0)
MONO#: 0.5 10*3/uL (ref 0.1–0.9)
MONO%: 9.2 % (ref 0.0–14.0)
NEUT%: 60.4 % (ref 38.4–76.8)
NEUTROS ABS: 3.2 10*3/uL (ref 1.5–6.5)
PLATELETS: 168 10*3/uL (ref 145–400)
RBC: 4.03 10*6/uL (ref 3.70–5.45)
RDW: 13.8 % (ref 11.2–14.5)
WBC: 5.2 10*3/uL (ref 3.9–10.3)
lymph#: 1.5 10*3/uL (ref 0.9–3.3)

## 2016-06-07 LAB — COMPREHENSIVE METABOLIC PANEL
ALBUMIN: 3.7 g/dL (ref 3.5–5.0)
ALK PHOS: 104 U/L (ref 40–150)
ALT: 12 U/L (ref 0–55)
AST: 18 U/L (ref 5–34)
Anion Gap: 7 mEq/L (ref 3–11)
BILIRUBIN TOTAL: 0.5 mg/dL (ref 0.20–1.20)
BUN: 14.3 mg/dL (ref 7.0–26.0)
CO2: 25 mEq/L (ref 22–29)
Calcium: 9.4 mg/dL (ref 8.4–10.4)
Chloride: 108 mEq/L (ref 98–109)
Creatinine: 1.1 mg/dL (ref 0.6–1.1)
EGFR: 54 mL/min/{1.73_m2} — AB (ref >90)
GLUCOSE: 102 mg/dL (ref 70–140)
Potassium: 3.9 mEq/L (ref 3.5–5.1)
SODIUM: 140 meq/L (ref 136–145)
TOTAL PROTEIN: 7.3 g/dL (ref 6.4–8.3)

## 2016-06-07 LAB — MAGNESIUM: Magnesium: 1.9 mg/dl (ref 1.5–2.5)

## 2016-06-07 MED ORDER — SODIUM CHLORIDE 0.9 % IV SOLN
Freq: Once | INTRAVENOUS | Status: AC
  Administered 2016-06-07: 12:00:00 via INTRAVENOUS

## 2016-06-07 MED ORDER — PALONOSETRON HCL INJECTION 0.25 MG/5ML
INTRAVENOUS | Status: AC
  Filled 2016-06-07: qty 5

## 2016-06-07 MED ORDER — PALONOSETRON HCL INJECTION 0.25 MG/5ML
0.2500 mg | Freq: Once | INTRAVENOUS | Status: AC
  Administered 2016-06-07: 0.25 mg via INTRAVENOUS

## 2016-06-07 MED ORDER — MANNITOL 25 % IV SOLN
Freq: Once | INTRAVENOUS | Status: AC
  Administered 2016-06-07: 10:00:00 via INTRAVENOUS
  Filled 2016-06-07: qty 10

## 2016-06-07 MED ORDER — SODIUM CHLORIDE 0.9 % IV SOLN
Freq: Once | INTRAVENOUS | Status: AC
  Administered 2016-06-07: 12:00:00 via INTRAVENOUS
  Filled 2016-06-07: qty 5

## 2016-06-07 MED ORDER — HEPARIN SOD (PORK) LOCK FLUSH 100 UNIT/ML IV SOLN
500.0000 [IU] | Freq: Once | INTRAVENOUS | Status: AC | PRN
  Administered 2016-06-07: 500 [IU]
  Filled 2016-06-07: qty 5

## 2016-06-07 MED ORDER — SODIUM CHLORIDE 0.9% FLUSH
10.0000 mL | INTRAVENOUS | Status: DC | PRN
  Administered 2016-06-07: 10 mL
  Filled 2016-06-07: qty 10

## 2016-06-07 MED ORDER — SODIUM CHLORIDE 0.9 % IV SOLN
25.0000 mg/m2 | Freq: Once | INTRAVENOUS | Status: AC
  Administered 2016-06-07: 47 mg via INTRAVENOUS
  Filled 2016-06-07: qty 47

## 2016-06-07 MED ORDER — GEMCITABINE HCL CHEMO INJECTION 1 GM/26.3ML
800.0000 mg/m2 | Freq: Once | INTRAVENOUS | Status: AC
  Administered 2016-06-07: 1520 mg via INTRAVENOUS
  Filled 2016-06-07: qty 39.98

## 2016-06-07 NOTE — Patient Instructions (Signed)
[facility] Discharge Instructions for Patients Receiving Chemotherapy  Today you received the following chemotherapy agents:  Gemzar, Cisplatin  To help prevent nausea and vomiting after your treatment, we encourage you to take your nausea medication as prescribed.   If you develop nausea and vomiting that is not controlled by your nausea medication, call the clinic.   BELOW ARE SYMPTOMS THAT SHOULD BE REPORTED IMMEDIATELY:  *FEVER GREATER THAN 100.5 F  *CHILLS WITH OR WITHOUT FEVER  NAUSEA AND VOMITING THAT IS NOT CONTROLLED WITH YOUR NAUSEA MEDICATION  *UNUSUAL SHORTNESS OF BREATH  *UNUSUAL BRUISING OR BLEEDING  TENDERNESS IN MOUTH AND THROAT WITH OR WITHOUT PRESENCE OF ULCERS  *URINARY PROBLEMS  *BOWEL PROBLEMS  UNUSUAL RASH Items with * indicate a potential emergency and should be followed up as soon as possible.  Feel free to call the clinic you have any questions or concerns. The clinic phone number is [phone].  Please show the [facility] at check-in to the Emergency Department and triage nurse.    Cisplatin injection What is this medicine? CISPLATIN (SIS pla tin) is a chemotherapy drug. It targets fast dividing cells, like cancer cells, and causes these cells to die. This medicine is used to treat many types of cancer like bladder, ovarian, and testicular cancers. This medicine may be used for other purposes; ask your health care provider or pharmacist if you have questions. COMMON BRAND NAME(S): Platinol, Platinol -AQ What should I tell my health care provider before I take this medicine? They need to know if you have any of these conditions: -blood disorders -hearing problems -kidney disease -recent or ongoing radiation therapy -an unusual or allergic reaction to cisplatin, carboplatin, other chemotherapy, other medicines, foods, dyes, or preservatives -pregnant or trying to get pregnant -breast-feeding How should I use this  medicine? This drug is given as an infusion into a vein. It is administered in a hospital or clinic by a specially trained health care professional. Talk to your pediatrician regarding the use of this medicine in children. Special care may be needed. Overdosage: If you think you have taken too much of this medicine contact a poison control center or emergency room at once. NOTE: This medicine is only for you. Do not share this medicine with others. What if I miss a dose? It is important not to miss a dose. Call your doctor or health care professional if you are unable to keep an appointment. What may interact with this medicine? -dofetilide -foscarnet -medicines for seizures -medicines to increase blood counts like filgrastim, pegfilgrastim, sargramostim -probenecid -pyridoxine used with altretamine -rituximab -some antibiotics like amikacin, gentamicin, neomycin, polymyxin B, streptomycin, tobramycin -sulfinpyrazone -vaccines -zalcitabine Talk to your doctor or health care professional before taking any of these medicines: -acetaminophen -aspirin -ibuprofen -ketoprofen -naproxen This list may not describe all possible interactions. Give your health care provider a list of all the medicines, herbs, non-prescription drugs, or dietary supplements you use. Also tell them if you smoke, drink alcohol, or use illegal drugs. Some items may interact with your medicine. What should I watch for while using this medicine? Your condition will be monitored carefully while you are receiving this medicine. You will need important blood work done while you are taking this medicine. This drug may make you feel generally unwell. This is not uncommon, as chemotherapy can affect healthy cells as well as cancer cells. Report any side effects. Continue your course of treatment even though you feel ill unless your doctor tells  you to stop. In some cases, you may be given additional medicines to help with side  effects. Follow all directions for their use. Call your doctor or health care professional for advice if you get a fever, chills or sore throat, or other symptoms of a cold or flu. Do not treat yourself. This drug decreases your body's ability to fight infections. Try to avoid being around people who are sick. This medicine may increase your risk to bruise or bleed. Call your doctor or health care professional if you notice any unusual bleeding. Be careful brushing and flossing your teeth or using a toothpick because you may get an infection or bleed more easily. If you have any dental work done, tell your dentist you are receiving this medicine. Avoid taking products that contain aspirin, acetaminophen, ibuprofen, naproxen, or ketoprofen unless instructed by your doctor. These medicines may hide a fever. Do not become pregnant while taking this medicine. Women should inform their doctor if they wish to become pregnant or think they might be pregnant. There is a potential for serious side effects to an unborn child. Talk to your health care professional or pharmacist for more information. Do not breast-feed an infant while taking this medicine. Drink fluids as directed while you are taking this medicine. This will help protect your kidneys. Call your doctor or health care professional if you get diarrhea. Do not treat yourself. What side effects may I notice from receiving this medicine? Side effects that you should report to your doctor or health care professional as soon as possible: -allergic reactions like skin rash, itching or hives, swelling of the face, lips, or tongue -signs of infection - fever or chills, cough, sore throat, pain or difficulty passing urine -signs of decreased platelets or bleeding - bruising, pinpoint red spots on the skin, black, tarry stools, nosebleeds -signs of decreased red blood cells - unusually weak or tired, fainting spells, lightheadedness -breathing  problems -changes in hearing -gout pain -low blood counts - This drug may decrease the number of white blood cells, red blood cells and platelets. You may be at increased risk for infections and bleeding. -nausea and vomiting -pain, swelling, redness or irritation at the injection site -pain, tingling, numbness in the hands or feet -problems with balance, movement -trouble passing urine or change in the amount of urine Side effects that usually do not require medical attention (report to your doctor or health care professional if they continue or are bothersome): -changes in vision -loss of appetite -metallic taste in the mouth or changes in taste This list may not describe all possible side effects. Call your doctor for medical advice about side effects. You may report side effects to FDA at 1-800-FDA-1088. Where should I keep my medicine? This drug is given in a hospital or clinic and will not be stored at home. NOTE: This sheet is a summary. It may not cover all possible information. If you have questions about this medicine, talk to your doctor, pharmacist, or health care provider.  2018 Elsevier/Gold Standard (2007-05-27 14:40:54)    Gemcitabine injection What is this medicine? GEMCITABINE (jem SIT a been) is a chemotherapy drug. This medicine is used to treat many types of cancer like breast cancer, lung cancer, pancreatic cancer, and ovarian cancer. This medicine may be used for other purposes; ask your health care provider or pharmacist if you have questions. COMMON BRAND NAME(S): Gemzar What should I tell my health care provider before I take this medicine? They need to  know if you have any of these conditions: -blood disorders -infection -kidney disease -liver disease -recent or ongoing radiation therapy -an unusual or allergic reaction to gemcitabine, other chemotherapy, other medicines, foods, dyes, or preservatives -pregnant or trying to get  pregnant -breast-feeding How should I use this medicine? This drug is given as an infusion into a vein. It is administered in a hospital or clinic by a specially trained health care professional. Talk to your pediatrician regarding the use of this medicine in children. Special care may be needed. Overdosage: If you think you have taken too much of this medicine contact a poison control center or emergency room at once. NOTE: This medicine is only for you. Do not share this medicine with others. What if I miss a dose? It is important not to miss your dose. Call your doctor or health care professional if you are unable to keep an appointment. What may interact with this medicine? -medicines to increase blood counts like filgrastim, pegfilgrastim, sargramostim -some other chemotherapy drugs like cisplatin -vaccines Talk to your doctor or health care professional before taking any of these medicines: -acetaminophen -aspirin -ibuprofen -ketoprofen -naproxen This list may not describe all possible interactions. Give your health care provider a list of all the medicines, herbs, non-prescription drugs, or dietary supplements you use. Also tell them if you smoke, drink alcohol, or use illegal drugs. Some items may interact with your medicine. What should I watch for while using this medicine? Visit your doctor for checks on your progress. This drug may make you feel generally unwell. This is not uncommon, as chemotherapy can affect healthy cells as well as cancer cells. Report any side effects. Continue your course of treatment even though you feel ill unless your doctor tells you to stop. In some cases, you may be given additional medicines to help with side effects. Follow all directions for their use. Call your doctor or health care professional for advice if you get a fever, chills or sore throat, or other symptoms of a cold or flu. Do not treat yourself. This drug decreases your body's ability to  fight infections. Try to avoid being around people who are sick. This medicine may increase your risk to bruise or bleed. Call your doctor or health care professional if you notice any unusual bleeding. Be careful brushing and flossing your teeth or using a toothpick because you may get an infection or bleed more easily. If you have any dental work done, tell your dentist you are receiving this medicine. Avoid taking products that contain aspirin, acetaminophen, ibuprofen, naproxen, or ketoprofen unless instructed by your doctor. These medicines may hide a fever. Women should inform their doctor if they wish to become pregnant or think they might be pregnant. There is a potential for serious side effects to an unborn child. Talk to your health care professional or pharmacist for more information. Do not breast-feed an infant while taking this medicine. What side effects may I notice from receiving this medicine? Side effects that you should report to your doctor or health care professional as soon as possible: -allergic reactions like skin rash, itching or hives, swelling of the face, lips, or tongue -low blood counts - this medicine may decrease the number of white blood cells, red blood cells and platelets. You may be at increased risk for infections and bleeding. -signs of infection - fever or chills, cough, sore throat, pain or difficulty passing urine -signs of decreased platelets or bleeding - bruising, pinpoint red spots  on the skin, black, tarry stools, blood in the urine -signs of decreased red blood cells - unusually weak or tired, fainting spells, lightheadedness -breathing problems -chest pain -mouth sores -nausea and vomiting -pain, swelling, redness at site where injected -pain, tingling, numbness in the hands or feet -stomach pain -swelling of ankles, feet, hands -unusual bleeding Side effects that usually do not require medical attention (report to your doctor or health care  professional if they continue or are bothersome): -constipation -diarrhea -hair loss -loss of appetite -stomach upset This list may not describe all possible side effects. Call your doctor for medical advice about side effects. You may report side effects to FDA at 1-800-FDA-1088. Where should I keep my medicine? This drug is given in a hospital or clinic and will not be stored at home. NOTE: This sheet is a summary. It may not cover all possible information. If you have questions about this medicine, talk to your doctor, pharmacist, or health care provider.  2018 Elsevier/Gold Standard (2007-07-01 18:45:54)

## 2016-06-07 NOTE — Patient Instructions (Signed)
Here is some information for when you have your port inserted:  Implanted [facility] Guide An implanted port is a type of central line that is placed under the skin. Central lines are used to provide IV access when treatment or nutrition needs to be given through a person's veins. Implanted ports are used for long-term IV access. An implanted port may be placed because:   You need IV medicine that would be irritating to the small veins in your hands or arms.   You need long-term IV medicines, such as antibiotics.   You need IV nutrition for a long period.   You need frequent blood draws for lab tests.   You need dialysis.  Implanted ports are usually placed in the chest area, but they can also be placed in the upper arm, the abdomen, or the leg. An implanted port has two main parts:   Reservoir. The reservoir is round and will appear as a small, raised area under your skin. The reservoir is the part where a needle is inserted to give medicines or draw blood.   Catheter. The catheter is a thin, flexible tube that extends from the reservoir. The catheter is placed into a large vein. Medicine that is inserted into the reservoir goes into the catheter and then into the vein.  HOW WILL I CARE FOR MY INCISION SITE? Do not get the incision site wet. Bathe or shower as directed by your health care provider.  HOW IS MY PORT ACCESSED? Special steps must be taken to access the port:   Before the port is accessed, a numbing cream can be placed on the skin. This helps numb the skin over the port site.   Your health care provider uses a sterile technique to access the port.  Your health care provider must put on a mask and sterile gloves.  The skin over your port is cleaned carefully with an antiseptic and allowed to dry.  The port is gently pinched between sterile gloves, and a needle is inserted into the port.  Only "non-coring" port needles should be used to access the port. Once the  port is accessed, a blood return should be checked. This helps ensure that the port is in the vein and is not clogged.   If your port needs to remain accessed for a constant infusion, a clear (transparent) bandage will be placed over the needle site. The bandage and needle will need to be changed every week, or as directed by your health care provider.   Keep the bandage covering the needle clean and dry. Do not get it wet. Follow your health care provider's instructions on how to take a shower or bath while the port is accessed.   If your port does not need to stay accessed, no bandage is needed over the port.  WHAT IS FLUSHING? Flushing helps keep the port from getting clogged. Follow your health care provider's instructions on how and when to flush the port. Ports are usually flushed with saline solution or a medicine called heparin. The need for flushing will depend on how the port is used.   If the port is used for intermittent medicines or blood draws, the port will need to be flushed:   After medicines have been given.   After blood has been drawn.   As part of routine maintenance.   If a constant infusion is running, the port may not need to be flushed.  HOW LONG WILL MY PORT STAY IMPLANTED?  The port can stay in for as long as your health care provider thinks it is needed. When it is time for the port to come out, surgery will be done to remove it. The procedure is similar to the one performed when the port was put in.  WHEN SHOULD I SEEK IMMEDIATE MEDICAL CARE? When you have an implanted port, you should seek immediate medical care if:   You notice a bad smell coming from the incision site.   You have swelling, redness, or drainage at the incision site.   You have more swelling or pain at the port site or the surrounding area.   You have a fever that is not controlled with medicine.   This information is not intended to replace advice given to you by your health  care provider. Make sure you discuss any questions you have with your health care provider.   Document Released: 02/19/2005 Document Revised: 12/10/2012 Document Reviewed: 10/27/2012 Elsevier Interactive Patient Education [employer].

## 2016-06-14 ENCOUNTER — Other Ambulatory Visit (HOSPITAL_BASED_OUTPATIENT_CLINIC_OR_DEPARTMENT_OTHER): Payer: Medicare HMO

## 2016-06-14 ENCOUNTER — Encounter: Payer: Self-pay | Admitting: Hematology and Oncology

## 2016-06-14 ENCOUNTER — Ambulatory Visit (HOSPITAL_BASED_OUTPATIENT_CLINIC_OR_DEPARTMENT_OTHER): Payer: Medicare HMO | Admitting: Hematology and Oncology

## 2016-06-14 ENCOUNTER — Ambulatory Visit: Payer: Medicare HMO

## 2016-06-14 ENCOUNTER — Other Ambulatory Visit: Payer: Self-pay | Admitting: Hematology and Oncology

## 2016-06-14 VITALS — BP 116/76 | HR 58 | Temp 97.9°F | Resp 17

## 2016-06-14 DIAGNOSIS — C787 Secondary malignant neoplasm of liver and intrahepatic bile duct: Secondary | ICD-10-CM

## 2016-06-14 DIAGNOSIS — C799 Secondary malignant neoplasm of unspecified site: Secondary | ICD-10-CM

## 2016-06-14 DIAGNOSIS — C539 Malignant neoplasm of cervix uteri, unspecified: Secondary | ICD-10-CM

## 2016-06-14 DIAGNOSIS — D61818 Other pancytopenia: Secondary | ICD-10-CM

## 2016-06-14 DIAGNOSIS — T451X5A Adverse effect of antineoplastic and immunosuppressive drugs, initial encounter: Secondary | ICD-10-CM

## 2016-06-14 DIAGNOSIS — C7801 Secondary malignant neoplasm of right lung: Secondary | ICD-10-CM

## 2016-06-14 DIAGNOSIS — C7802 Secondary malignant neoplasm of left lung: Secondary | ICD-10-CM

## 2016-06-14 DIAGNOSIS — G620 Drug-induced polyneuropathy: Secondary | ICD-10-CM

## 2016-06-14 DIAGNOSIS — C7989 Secondary malignant neoplasm of other specified sites: Secondary | ICD-10-CM

## 2016-06-14 LAB — CBC WITH DIFFERENTIAL/PLATELET
BASO%: 0.6 % (ref 0.0–2.0)
BASOS ABS: 0 10*3/uL (ref 0.0–0.1)
EOS ABS: 0 10*3/uL (ref 0.0–0.5)
EOS%: 0.9 % (ref 0.0–7.0)
HCT: 32.3 % — ABNORMAL LOW (ref 34.8–46.6)
HGB: 10.9 g/dL — ABNORMAL LOW (ref 11.6–15.9)
LYMPH%: 63.3 % — AB (ref 14.0–49.7)
MCH: 29.4 pg (ref 25.1–34.0)
MCHC: 33.7 g/dL (ref 31.5–36.0)
MCV: 87.4 fL (ref 79.5–101.0)
MONO#: 0.1 10*3/uL (ref 0.1–0.9)
MONO%: 2.1 % (ref 0.0–14.0)
NEUT#: 0.8 10*3/uL — ABNORMAL LOW (ref 1.5–6.5)
NEUT%: 33.1 % — ABNORMAL LOW (ref 38.4–76.8)
Platelets: 115 10*3/uL — ABNORMAL LOW (ref 145–400)
RBC: 3.69 10*6/uL — AB (ref 3.70–5.45)
RDW: 13.1 % (ref 11.2–14.5)
WBC: 2.6 10*3/uL — AB (ref 3.9–10.3)
lymph#: 1.6 10*3/uL (ref 0.9–3.3)

## 2016-06-14 LAB — MAGNESIUM: Magnesium: 1.6 mg/dl (ref 1.5–2.5)

## 2016-06-14 LAB — COMPREHENSIVE METABOLIC PANEL
ALT: 28 U/L (ref 0–55)
AST: 22 U/L (ref 5–34)
Albumin: 3.8 g/dL (ref 3.5–5.0)
Alkaline Phosphatase: 116 U/L (ref 40–150)
Anion Gap: 8 mEq/L (ref 3–11)
BUN: 19 mg/dL (ref 7.0–26.0)
CHLORIDE: 105 meq/L (ref 98–109)
CO2: 25 meq/L (ref 22–29)
Calcium: 9.1 mg/dL (ref 8.4–10.4)
Creatinine: 1 mg/dL (ref 0.6–1.1)
EGFR: 60 mL/min/{1.73_m2} — AB (ref >90)
GLUCOSE: 86 mg/dL (ref 70–140)
POTASSIUM: 4.1 meq/L (ref 3.5–5.1)
SODIUM: 138 meq/L (ref 136–145)
Total Bilirubin: 0.52 mg/dL (ref 0.20–1.20)
Total Protein: 7.3 g/dL (ref 6.4–8.3)

## 2016-06-14 MED ORDER — SODIUM CHLORIDE 0.9% FLUSH
10.0000 mL | Freq: Once | INTRAVENOUS | Status: AC
  Administered 2016-06-14: 10 mL
  Filled 2016-06-14: qty 10

## 2016-06-14 MED ORDER — HEPARIN SOD (PORK) LOCK FLUSH 100 UNIT/ML IV SOLN
500.0000 [IU] | Freq: Once | INTRAVENOUS | Status: DC
  Filled 2016-06-14: qty 5

## 2016-06-14 NOTE — Progress Notes (Signed)
[place] OFFICE PROGRESS NOTE  Patient Care Team: [name], MD as PCP - General (Family Medicine) Gordy Levan, MD as Consulting Physician (Oncology) Carmel Sacramento, OD as Consulting Physician (Optometry)  SUMMARY OF ONCOLOGIC HISTORY:   Cervical cancer ([place])   04/26/2015 Imaging    Numerous bilateral pulmonary nodules consistent with metastatic disease are associated with liver lesions, omental nodules, mesenteric nodules, necrotic retroperitoneal lymphadenopathy in a mixed cystic and solid mass in the central pelvis involving the uterus extending into the cul-de-sac and both adnexal regions. Uterine primary (likely endometrial) is favored over an ovarian primary malignancy given the lack of ascites, associated intrahepatic metastases and pulmonary involvement.      05/04/2015 Procedure    Technically successful CT-guided core biopsy of left pelvic mass      05/04/2015 Pathology Results    Soft Tissue Needle Core Biopsy, left ext iliac mass METASTATIC HIGH GRADE POORLY DIFFERENTIATED CARCINOMA CONSISTENT WITH CERVICAL ORIGIN Microscopic Comment The neoplasm stains positive for cervical markers : p16, ER, ck8/18, and p63 (focal), TTF-1 (focal) and negative for synaptophysin, Chromogranin (neuroendocrine markers) and ck5/6. The morphology and immunohistochemistry staining pattern support these cells are GYN cervical origin.Spec      05/10/2015 Imaging    Innumerable (> than 40) pulmonary nodules randomly distributed throughout both lungs, largest 1.1 cm, in keeping with pulmonary metastases. 2. Left supraclavicular and left hilar nodal metastases. 3. Re- demonstration of liver metastases. 4. Right thyroid lobe 1.4 cm pulmonary nodule, for which no further evaluation is recommended. 5. Stable mild superior T11 vertebral compression deformity.      05/12/2015 - 05/12/2015 Chemotherapy    She received 1 dose of carbo/taxol only      05/15/2015 Imaging    Widely  metastatic cervical carcinoma with unchanged appearance compared to 04/26/2015. Bulky tumor narrows and likely invades the rectum/distal sigmoid with progressive proximal stool retention      05/15/2015 - 05/23/2015 Hospital Admission    She was admitted for severe bowel obstruction requiring urgent diversion colostomy      05/18/2015 Surgery    She underwent LAPAROSCOPIC DIVERTING SIGMOID LOOP COLOSTOMY for bowel obstruction       06/10/2015 Procedure    Successful placement of a right internal jugular approach power injectable Port-A-Cath. The catheter is ready for immediate use      06/23/2015 - 03/01/2016 Chemotherapy    She received combination treatment with cisplatin, Taxol and Avastin. Avastin was started on 5/11. Dose of Taxol was modified due to neuropathy and cisplatin was omited last dose due to 'weakness'      09/13/2015 Imaging    Significant interval treatment response. Pulmonary and liver metastases have significantly decreased in size. Thoracic adenopathy has decreased in size. Mixed attenuation mass encompassing the uterus and bilateral adnexal regions has significantly decreased in size. No residual visualized peritoneal tumor implants. No new or progressive metastatic disease. 2. No evidence of bowel obstruction or acute bowel inflammation status post diverting sigmoid colostomy. 3. Additional findings include mild aortic atherosclerosis and Stable mild chronic superior T12 compression fracture.      12/14/2015 Imaging    Stable to improved interval exam. Multiple bilateral pulmonary nodules are stable in size to decreased and the liver lesions are stable to decreased. Mixed attenuation lesion encompassing the uterus and adnexal region shows decrease in size. There is no evidence for new or progressive disease on today's exam.      05/15/2016 Imaging    Findings today reflecting mixed interval  response to therapy. 2. There has been mild increase in size of pulmonary nodules.  3. Interval increase in size of pelvic mass 4. Further regression of liver metastases 5. There is a new soft tissue nodule within the right lower quadrant peritoneal cavity. Suspicious peritoneal disease      06/07/2016 -  Chemotherapy    She received chemo with cisplatin and gemzar      06/14/2016 Adverse Reaction    Cycle 1, day 8 is put on hold due to severe pancytopenia       INTERVAL HISTORY: Please see below for problem oriented charting. She is seen in the infusion room for cycle 1, day 8 treatment. She had some mild nausea and persistent neuropathy from prior chemo Denies mucositis Denies hearing problems. She had no recent fever, chills or cough to suggest infection  REVIEW OF SYSTEMS:   Constitutional: Denies fevers, chills or abnormal weight loss Eyes: Denies blurriness of vision Ears, nose, mouth, throat, and face: Denies mucositis or sore throat Respiratory: Denies cough, dyspnea or wheezes Cardiovascular: Denies palpitation, chest discomfort or lower extremity swelling Skin: Denies abnormal skin rashes Lymphatics: Denies new lymphadenopathy or easy bruising Behavioral/Psych: Mood is stable, no new changes  All other systems were reviewed with the patient and are negative.  I have reviewed the past medical history, past surgical history, social history and family history with the patient and they are unchanged from previous note.  ALLERGIES:  has No Known Allergies.  MEDICATIONS:  Current Outpatient Prescriptions  Medication Sig Dispense Refill  . cholecalciferol (VITAMIN D) 1000 units tablet Take 1,000 Units by mouth daily.    Marland Kitchen omeprazole (PRILOSEC) 20 MG capsule Take 1 capsule (20 mg total) by mouth daily. (Patient taking differently: Take 20 mg by mouth daily as needed. ) 30 capsule 3  . temazepam (RESTORIL) 15 MG capsule Take 15 mg by mouth at bedtime as needed for sleep.     No current facility-administered medications for this visit.     Facility-Administered Medications Ordered in Other Visits  Medication Dose Route Frequency Provider Last Rate Last Dose  . heparin lock flush 100 unit/mL  500 Units Intravenous Once Lennis Marion Downer, MD      . heparin lock flush 100 unit/mL  500 Units Intracatheter Once Heath Lark, MD      . sodium chloride flush (NS) 0.9 % injection 10 mL  10 mL Intravenous PRN Lennis Marion Downer, MD        PHYSICAL EXAMINATION: ECOG PERFORMANCE STATUS: 1 - Symptomatic but completely ambulatory GENERAL:alert, no distress and comfortable SKIN: skin color, texture, turgor are normal, no rashes or significant lesions EYES: normal, Conjunctiva are pink and non-injected, sclera clear OROPHARYNX:no exudate, no erythema and lips, buccal mucosa, and tongue normal  NECK: supple, thyroid normal size, non-tender, without nodularity LYMPH:  no palpable lymphadenopathy in the cervical, axillary or inguinal LUNGS: clear to auscultation and percussion with normal breathing effort HEART: regular rate & rhythm and no murmurs and no lower extremity edema ABDOMEN:abdomen soft, non-tender and normal bowel sounds Musculoskeletal:no cyanosis of digits and no clubbing  NEURO: alert & oriented x 3 with fluent speech, no focal motor/sensory deficits  LABORATORY DATA:  I have reviewed the data as listed    Component Value Date/Time   NA 138 06/14/2016 1240   K 4.1 06/14/2016 1240   CL 109 05/22/2015 0522   CO2 25 06/14/2016 1240   GLUCOSE 86 06/14/2016 1240   BUN 19.0 06/14/2016 1240  CREATININE 1.0 06/14/2016 1240   CALCIUM 9.1 06/14/2016 1240   PROT 7.3 06/14/2016 1240   ALBUMIN 3.8 06/14/2016 1240   AST 22 06/14/2016 1240   ALT 28 06/14/2016 1240   ALKPHOS 116 06/14/2016 1240   BILITOT 0.52 06/14/2016 1240   GFRNONAA >60 05/22/2015 0522   GFRAA >60 05/22/2015 0522    No results found for: SPEP, UPEP  Lab Results  Component Value Date   WBC 2.6 (L) 06/14/2016   NEUTROABS 0.8 (L) 06/14/2016   HGB 10.9 (L)  06/14/2016   HCT 32.3 (L) 06/14/2016   MCV 87.4 06/14/2016   PLT 115 (L) 06/14/2016      Chemistry      Component Value Date/Time   NA 138 06/14/2016 1240   K 4.1 06/14/2016 1240   CL 109 05/22/2015 0522   CO2 25 06/14/2016 1240   BUN 19.0 06/14/2016 1240   CREATININE 1.0 06/14/2016 1240      Component Value Date/Time   CALCIUM 9.1 06/14/2016 1240   ALKPHOS 116 06/14/2016 1240   AST 22 06/14/2016 1240   ALT 28 06/14/2016 1240   BILITOT 0.52 06/14/2016 1240       ASSESSMENT & PLAN:  Cervical cancer ([place]) She has minor side effect with nausea and mild peripheral neuropathy Blood count came back revealing that she is profoundly pancytopenic I will omit day 8 chemotherapy today I will see her back in 2 weeks to start cycle 2 with plan to further reduce the dose of chemotherapy I will keep cisplatin at 25 milligrams per meter squared and reduce gemcitabine to 500 mg/m     Chemotherapy-induced peripheral neuropathy ([facility]) She has trace peripheral neuropathy from prior treatment. I plan to reduce the dose of cisplatin as above  Malignant neoplasm metastatic to left lung [facility]) She is not symptomatic from lung metastasis  Metastases to the liver [facility]) I cannot see the liver metastasis anymore on recent imaging.  She responded to recent treatment with normal liver function  Pancytopenia, acquired ([place]) She is not symptomatic from pancytopenia We discussed neutropenic precaution I will omit chemotherapy today and plan to reduce dose of chemotherapy in the future as outlined above   No orders of the defined types were placed in this encounter.  All questions were answered. The patient knows to call the clinic with any problems, questions or concerns. No barriers to learning was detected. I spent 25 minutes counseling the patient face to face. The total time spent in the appointment was 30 minutes and more than 50% was on counseling and review of test results     Heath Lark, MD 06/14/2016 1:58 PM

## 2016-06-14 NOTE — Patient Instructions (Signed)
Neutropenia Neutropenia is a condition that occurs when you have a lower-than-normal level of a type of white blood cell (neutrophil) in your body. Neutrophils are made in the spongy center of large bones (bone marrow) and they fight infections. Neutrophils are your body's main defense against bacterial and fungal infections. The fewer neutrophils you have and the longer your body remains without them, the greater your risk of getting a severe infection. What are the causes? This condition can occur if your body uses up or destroys neutrophils faster than your bone marrow can make them. This problem may happen because of:  Bacterial or fungal infection.  Allergic disorders.  Reactions to some medicines.  Autoimmune disease.  An enlarged spleen. This condition can also occur if your bone marrow does not produce enough neutrophils. This problem may be caused by:  Cancer.  Cancer treatments, such as radiation or chemotherapy.  Viral infections.  Medicines, such as phenytoin.  Vitamin B12 deficiency.  Diseases of the bone marrow.  Environmental toxins, such as insecticides. What are the signs or symptoms? This condition does not usually cause symptoms. If symptoms are present, they are usually caused by an underlying infection. Symptoms of an infection may include:  Fever.  Chills.  Swollen glands.  Oral or anal ulcers.  Cough and shortness of breath.  Rash.  Skin infection.  Fatigue. How is this diagnosed? Your health care provider may suspect neutropenia if you have:  A condition that may cause neutropenia.  Symptoms of infection, especially fever.  Frequent and unusual infections. You will have a medical history and physical exam. Tests will also be done, such as:  A complete blood count (CBC).  A procedure to collect a sample of bone marrow for examination (bone marrow biopsy).  A chest X-ray.  A urine culture.  A blood culture. How is this  treated? Treatment depends on the underlying cause and severity of your condition. Mild neutropenia may not require treatment. Treatment may include medicines, such as:  Antibiotic medicine given through an IV tube.  Antiviral medicines.  Antifungal medicines.  A medicine to increase neutrophil production (colony-stimulating factor). You may get this drug through an IV tube or by injection.  Steroids given through an IV tube. If an underlying condition is causing neutropenia, you may need treatment for that condition. If medicines you are taking are causing neutropenia, your health care provider may have you stop taking those medicines. Follow these instructions at home: Medicines   Take over-the-counter and prescription medicines only as told by your health care provider.  Get a seasonal flu shot (influenza vaccine). Lifestyle   Do not eat unpasteurized foods.Do not eat unwashed raw fruits or vegetables.  Avoid exposure to groups of people or children.  Avoid being around people who are sick.  Avoid being around dirt or dust, such as in construction areas or gardens.  Do not provide direct care for pets. Avoid animal droppings. Do not clean litter boxes and bird cages. Hygiene    Bathe daily.  Clean the area between the genitals and the anus (perineal area) after you urinate or have a bowel movement. If you are female, wipe from front to back.  Brush your teeth with a soft toothbrush before and after meals.  Do not use a razor that has a blade. Use an electric razor to remove hair.  Wash your hands often. Make sure others who come in contact with you also wash their hands. If soap and water are not available,  use hand sanitizer. General instructions   Do not have sex unless your health care provider has approved.  Take actions to avoid cuts and burns. For example:  Be cautious when you use knives. Always cut away from yourself.  Keep knives in protective sheaths or  guards when not in use.  Use oven mitts when you cook with a hot stove, oven, or grill.  Stand a safe distance away from open fires.  Avoid people who received a vaccine in the past 30 days if that vaccine contained a live version of the germ (live vaccine). You should not get a live vaccine. Common live vaccines are varicella, measles, mumps, and rubella.  Do not share food utensils.  Do not use tampons, enemas, or rectal suppositories unless your health care provider has approved.  Keep all appointments as told by your health care provider. This is important. Contact a health care provider if:  You have a fever.  You have chills or you start to shake.  You have:  A sore throat.  A warm, red, or tender area on your skin.  A cough.  Frequent or painful urination.  Vaginal discharge or itching.  You develop:  Sores in your mouth or anus.  Swollen lymph nodes.  Red streaks on the skin.  A rash.  You feel:  Nauseous or you vomit.  Very fatigued.  Short of breath. This information is not intended to replace advice given to you by your health care provider. Make sure you discuss any questions you have with your health care provider. Document Released: 08/11/2001 Document Revised: 07/28/2015 Document Reviewed: 09/01/2014 Elsevier Interactive Patient Education  2017 [employer].

## 2016-06-14 NOTE — Progress Notes (Signed)
Per MD, no treatment today. ANC low.

## 2016-06-14 NOTE — Assessment & Plan Note (Signed)
I cannot see the liver metastasis anymore on recent imaging.  She responded to recent treatment with normal liver function

## 2016-06-14 NOTE — Assessment & Plan Note (Signed)
She is not symptomatic from lung metastasis 

## 2016-06-14 NOTE — Assessment & Plan Note (Signed)
She has minor side effect with nausea and mild peripheral neuropathy Blood count came back revealing that she is profoundly pancytopenic I will omit day 8 chemotherapy today I will see her back in 2 weeks to start cycle 2 with plan to further reduce the dose of chemotherapy I will keep cisplatin at 25 milligrams per meter squared and reduce gemcitabine to 500 mg/m

## 2016-06-14 NOTE — Assessment & Plan Note (Signed)
She is not symptomatic from pancytopenia We discussed neutropenic precaution I will omit chemotherapy today and plan to reduce dose of chemotherapy in the future as outlined above

## 2016-06-14 NOTE — Assessment & Plan Note (Signed)
She has trace peripheral neuropathy from prior treatment. I plan to reduce the dose of cisplatin as above 

## 2016-06-15 ENCOUNTER — Telehealth: Payer: Self-pay | Admitting: Hematology and Oncology

## 2016-06-15 NOTE — Telephone Encounter (Signed)
Called patient to inform her of changes in appointments.  Rescheduled 4.27.18 appointment for 4.25.18 instead per NG. LVM

## 2016-06-19 ENCOUNTER — Ambulatory Visit: Payer: Medicare HMO | Admitting: Hematology and Oncology

## 2016-06-19 ENCOUNTER — Other Ambulatory Visit: Payer: Medicare HMO

## 2016-06-25 ENCOUNTER — Other Ambulatory Visit: Payer: Self-pay | Admitting: *Deleted

## 2016-06-25 MED ORDER — OMEPRAZOLE 20 MG PO CPDR
20.0000 mg | DELAYED_RELEASE_CAPSULE | Freq: Every day | ORAL | 0 refills | Status: DC
Start: 2016-06-25 — End: 2016-09-29

## 2016-06-27 ENCOUNTER — Ambulatory Visit (HOSPITAL_BASED_OUTPATIENT_CLINIC_OR_DEPARTMENT_OTHER): Payer: Medicare HMO

## 2016-06-27 ENCOUNTER — Ambulatory Visit (HOSPITAL_BASED_OUTPATIENT_CLINIC_OR_DEPARTMENT_OTHER): Payer: Medicare HMO | Admitting: Hematology and Oncology

## 2016-06-27 ENCOUNTER — Other Ambulatory Visit (HOSPITAL_BASED_OUTPATIENT_CLINIC_OR_DEPARTMENT_OTHER): Payer: Medicare HMO

## 2016-06-27 ENCOUNTER — Ambulatory Visit: Payer: Medicare HMO

## 2016-06-27 ENCOUNTER — Other Ambulatory Visit: Payer: Self-pay | Admitting: Hematology and Oncology

## 2016-06-27 VITALS — BP 126/68 | HR 64 | Temp 98.8°F | Resp 16

## 2016-06-27 DIAGNOSIS — C7801 Secondary malignant neoplasm of right lung: Secondary | ICD-10-CM

## 2016-06-27 DIAGNOSIS — C539 Malignant neoplasm of cervix uteri, unspecified: Secondary | ICD-10-CM

## 2016-06-27 DIAGNOSIS — T451X5A Adverse effect of antineoplastic and immunosuppressive drugs, initial encounter: Secondary | ICD-10-CM

## 2016-06-27 DIAGNOSIS — Z5111 Encounter for antineoplastic chemotherapy: Secondary | ICD-10-CM | POA: Diagnosis not present

## 2016-06-27 DIAGNOSIS — C799 Secondary malignant neoplasm of unspecified site: Secondary | ICD-10-CM

## 2016-06-27 DIAGNOSIS — D61818 Other pancytopenia: Secondary | ICD-10-CM | POA: Diagnosis not present

## 2016-06-27 DIAGNOSIS — G620 Drug-induced polyneuropathy: Secondary | ICD-10-CM | POA: Diagnosis not present

## 2016-06-27 DIAGNOSIS — C7989 Secondary malignant neoplasm of other specified sites: Secondary | ICD-10-CM

## 2016-06-27 DIAGNOSIS — C787 Secondary malignant neoplasm of liver and intrahepatic bile duct: Secondary | ICD-10-CM

## 2016-06-27 DIAGNOSIS — C7802 Secondary malignant neoplasm of left lung: Secondary | ICD-10-CM

## 2016-06-27 LAB — COMPREHENSIVE METABOLIC PANEL
ALT: 20 U/L (ref 0–55)
AST: 21 U/L (ref 5–34)
Albumin: 3.5 g/dL (ref 3.5–5.0)
Alkaline Phosphatase: 107 U/L (ref 40–150)
Anion Gap: 8 mEq/L (ref 3–11)
BUN: 12.4 mg/dL (ref 7.0–26.0)
CO2: 25 meq/L (ref 22–29)
Calcium: 9 mg/dL (ref 8.4–10.4)
Chloride: 109 mEq/L (ref 98–109)
Creatinine: 1 mg/dL (ref 0.6–1.1)
EGFR: 57 mL/min/{1.73_m2} — AB (ref >90)
GLUCOSE: 102 mg/dL (ref 70–140)
Potassium: 3.9 mEq/L (ref 3.5–5.1)
SODIUM: 142 meq/L (ref 136–145)
Total Bilirubin: 0.32 mg/dL (ref 0.20–1.20)
Total Protein: 6.8 g/dL (ref 6.4–8.3)

## 2016-06-27 LAB — CBC WITH DIFFERENTIAL/PLATELET
BASO%: 0.9 % (ref 0.0–2.0)
Basophils Absolute: 0 10*3/uL (ref 0.0–0.1)
EOS%: 1.8 % (ref 0.0–7.0)
Eosinophils Absolute: 0.1 10*3/uL (ref 0.0–0.5)
HCT: 31.8 % — ABNORMAL LOW (ref 34.8–46.6)
HGB: 10.4 g/dL — ABNORMAL LOW (ref 11.6–15.9)
LYMPH%: 38 % (ref 14.0–49.7)
MCH: 29.4 pg (ref 25.1–34.0)
MCHC: 32.7 g/dL (ref 31.5–36.0)
MCV: 89.8 fL (ref 79.5–101.0)
MONO#: 0.6 10*3/uL (ref 0.1–0.9)
MONO%: 17.5 % — ABNORMAL HIGH (ref 0.0–14.0)
NEUT#: 1.4 10*3/uL — ABNORMAL LOW (ref 1.5–6.5)
NEUT%: 41.8 % (ref 38.4–76.8)
Platelets: 252 10*3/uL (ref 145–400)
RBC: 3.54 10*6/uL — ABNORMAL LOW (ref 3.70–5.45)
RDW: 13.8 % (ref 11.2–14.5)
WBC: 3.4 10*3/uL — ABNORMAL LOW (ref 3.9–10.3)
lymph#: 1.3 10*3/uL (ref 0.9–3.3)

## 2016-06-27 LAB — MAGNESIUM: MAGNESIUM: 1.7 mg/dL (ref 1.5–2.5)

## 2016-06-27 MED ORDER — SODIUM CHLORIDE 0.9 % IV SOLN
500.0000 mg/m2 | Freq: Once | INTRAVENOUS | Status: AC
  Administered 2016-06-27: 950 mg via INTRAVENOUS
  Filled 2016-06-27: qty 24.98

## 2016-06-27 MED ORDER — HEPARIN SOD (PORK) LOCK FLUSH 100 UNIT/ML IV SOLN
500.0000 [IU] | Freq: Once | INTRAVENOUS | Status: DC | PRN
  Filled 2016-06-27: qty 5

## 2016-06-27 MED ORDER — PALONOSETRON HCL INJECTION 0.25 MG/5ML
INTRAVENOUS | Status: AC
  Filled 2016-06-27: qty 5

## 2016-06-27 MED ORDER — SODIUM CHLORIDE 0.9 % IV SOLN
Freq: Once | INTRAVENOUS | Status: AC
  Administered 2016-06-27: 11:00:00 via INTRAVENOUS
  Filled 2016-06-27: qty 5

## 2016-06-27 MED ORDER — SODIUM CHLORIDE 0.9% FLUSH
10.0000 mL | INTRAVENOUS | Status: DC | PRN
  Filled 2016-06-27: qty 10

## 2016-06-27 MED ORDER — SODIUM CHLORIDE 0.9 % IV SOLN
25.0000 mg/m2 | Freq: Once | INTRAVENOUS | Status: AC
  Administered 2016-06-27: 47 mg via INTRAVENOUS
  Filled 2016-06-27: qty 47

## 2016-06-27 MED ORDER — POTASSIUM CHLORIDE 2 MEQ/ML IV SOLN
Freq: Once | INTRAVENOUS | Status: AC
  Administered 2016-06-27: 09:00:00 via INTRAVENOUS
  Filled 2016-06-27: qty 10

## 2016-06-27 MED ORDER — SODIUM CHLORIDE 0.9% FLUSH
10.0000 mL | Freq: Once | INTRAVENOUS | Status: AC
  Administered 2016-06-27: 10 mL
  Filled 2016-06-27: qty 10

## 2016-06-27 MED ORDER — SODIUM CHLORIDE 0.9 % IV SOLN
Freq: Once | INTRAVENOUS | Status: AC
Start: 2016-06-27 — End: 2016-06-27
  Administered 2016-06-27: 09:00:00 via INTRAVENOUS

## 2016-06-27 MED ORDER — PALONOSETRON HCL INJECTION 0.25 MG/5ML
0.2500 mg | Freq: Once | INTRAVENOUS | Status: AC
  Administered 2016-06-27: 0.25 mg via INTRAVENOUS

## 2016-06-27 NOTE — Patient Instructions (Signed)
[place] Discharge Instructions for Patients Receiving Chemotherapy  Today you received the following chemotherapy agents Cisplatin, Gemzar  To help prevent nausea and vomiting after your treatment, we encourage you to take your nausea medication as needed   If you develop nausea and vomiting that is not controlled by your nausea medication, call the clinic.   BELOW ARE SYMPTOMS THAT SHOULD BE REPORTED IMMEDIATELY:  *FEVER GREATER THAN 100.5 F  *CHILLS WITH OR WITHOUT FEVER  NAUSEA AND VOMITING THAT IS NOT CONTROLLED WITH YOUR NAUSEA MEDICATION  *UNUSUAL SHORTNESS OF BREATH  *UNUSUAL BRUISING OR BLEEDING  TENDERNESS IN MOUTH AND THROAT WITH OR WITHOUT PRESENCE OF ULCERS  *URINARY PROBLEMS  *BOWEL PROBLEMS  UNUSUAL RASH Items with * indicate a potential emergency and should be followed up as soon as possible.  Feel free to call the clinic you have any questions or concerns. The clinic phone number is [phone].  Please show the [place] at check-in to the Emergency Department and triage nurse.

## 2016-06-28 ENCOUNTER — Encounter: Payer: Self-pay | Admitting: Hematology and Oncology

## 2016-06-28 NOTE — Assessment & Plan Note (Signed)
She is not symptomatic from pancytopenia I will plan to reduce dose of chemotherapy in the future as outlined above

## 2016-06-28 NOTE — Assessment & Plan Note (Signed)
She is not symptomatic from lung metastasis 

## 2016-06-28 NOTE — Assessment & Plan Note (Signed)
I cannot see the liver metastasis anymore on recent imaging.  She responded to recent treatment with normal liver function

## 2016-06-28 NOTE — Assessment & Plan Note (Signed)
She has minor side effect with nausea and mild peripheral neuropathy We have to hold and skip treatment recently due to pancytopenia I will keep cisplatin at 25 milligrams per meter squared and reduce gemcitabine to 500 mg/m I plan to repeat imaging study after 3 cycles of treatment

## 2016-06-28 NOTE — Assessment & Plan Note (Signed)
She has trace peripheral neuropathy from prior treatment. I plan to reduce the dose of cisplatin as above 

## 2016-06-28 NOTE — Progress Notes (Signed)
[facility] OFFICE PROGRESS NOTE  Patient Care Team: [name], MD as PCP - General (Family Medicine) Gordy Levan, MD as Consulting Physician (Oncology) Carmel Sacramento, OD as Consulting Physician (Optometry)  SUMMARY OF ONCOLOGIC HISTORY:   Cervical cancer ([place])   04/26/2015 Imaging    Numerous bilateral pulmonary nodules consistent with metastatic disease are associated with liver lesions, omental nodules, mesenteric nodules, necrotic retroperitoneal lymphadenopathy in a mixed cystic and solid mass in the central pelvis involving the uterus extending into the cul-de-sac and both adnexal regions. Uterine primary (likely endometrial) is favored over an ovarian primary malignancy given the lack of ascites, associated intrahepatic metastases and pulmonary involvement.      05/04/2015 Procedure    Technically successful CT-guided core biopsy of left pelvic mass      05/04/2015 Pathology Results    Soft Tissue Needle Core Biopsy, left ext iliac mass METASTATIC HIGH GRADE POORLY DIFFERENTIATED CARCINOMA CONSISTENT WITH CERVICAL ORIGIN Microscopic Comment The neoplasm stains positive for cervical markers : p16, ER, ck8/18, and p63 (focal), TTF-1 (focal) and negative for synaptophysin, Chromogranin (neuroendocrine markers) and ck5/6. The morphology and immunohistochemistry staining pattern support these cells are GYN cervical origin.Spec      05/10/2015 Imaging    Innumerable (> than 40) pulmonary nodules randomly distributed throughout both lungs, largest 1.1 cm, in keeping with pulmonary metastases. 2. Left supraclavicular and left hilar nodal metastases. 3. Re- demonstration of liver metastases. 4. Right thyroid lobe 1.4 cm pulmonary nodule, for which no further evaluation is recommended. 5. Stable mild superior T11 vertebral compression deformity.      05/12/2015 - 05/12/2015 Chemotherapy    She received 1 dose of carbo/taxol only      05/15/2015 Imaging    Widely  metastatic cervical carcinoma with unchanged appearance compared to 04/26/2015. Bulky tumor narrows and likely invades the rectum/distal sigmoid with progressive proximal stool retention      05/15/2015 - 05/23/2015 Hospital Admission    She was admitted for severe bowel obstruction requiring urgent diversion colostomy      05/18/2015 Surgery    She underwent LAPAROSCOPIC DIVERTING SIGMOID LOOP COLOSTOMY for bowel obstruction       06/10/2015 Procedure    Successful placement of a right internal jugular approach power injectable Port-A-Cath. The catheter is ready for immediate use      06/23/2015 - 03/01/2016 Chemotherapy    She received combination treatment with cisplatin, Taxol and Avastin. Avastin was started on 5/11. Dose of Taxol was modified due to neuropathy and cisplatin was omited last dose due to 'weakness'      09/13/2015 Imaging    Significant interval treatment response. Pulmonary and liver metastases have significantly decreased in size. Thoracic adenopathy has decreased in size. Mixed attenuation mass encompassing the uterus and bilateral adnexal regions has significantly decreased in size. No residual visualized peritoneal tumor implants. No new or progressive metastatic disease. 2. No evidence of bowel obstruction or acute bowel inflammation status post diverting sigmoid colostomy. 3. Additional findings include mild aortic atherosclerosis and Stable mild chronic superior T12 compression fracture.      12/14/2015 Imaging    Stable to improved interval exam. Multiple bilateral pulmonary nodules are stable in size to decreased and the liver lesions are stable to decreased. Mixed attenuation lesion encompassing the uterus and adnexal region shows decrease in size. There is no evidence for new or progressive disease on today's exam.      05/15/2016 Imaging    Findings today reflecting mixed interval  response to therapy. 2. There has been mild increase in size of pulmonary nodules.  3. Interval increase in size of pelvic mass 4. Further regression of liver metastases 5. There is a new soft tissue nodule within the right lower quadrant peritoneal cavity. Suspicious peritoneal disease      06/07/2016 -  Chemotherapy    She received chemo with cisplatin and gemzar      06/14/2016 Adverse Reaction    Cycle 1, day 8 is put on hold due to severe pancytopenia       INTERVAL HISTORY: Please see below for problem oriented charting. She is seen in the infusion room. She has very minimum trace nausea and neuropathy from treatment. The patient denies any recent signs or symptoms of bleeding such as spontaneous epistaxis, hematuria or hematochezia. No recent infection Her stoma output is stable, less diarrhea. She denies chest pain or shortness of breath  REVIEW OF SYSTEMS:   Constitutional: Denies fevers, chills or abnormal weight loss Eyes: Denies blurriness of vision Ears, nose, mouth, throat, and face: Denies mucositis or sore throat Respiratory: Denies cough, dyspnea or wheezes Cardiovascular: Denies palpitation, chest discomfort or lower extremity swelling Skin: Denies abnormal skin rashes Lymphatics: Denies new lymphadenopathy or easy bruising Neurological:Denies numbness, tingling or new weaknesses Behavioral/Psych: Mood is stable, no new changes  All other systems were reviewed with the patient and are negative.  I have reviewed the past medical history, past surgical history, social history and family history with the patient and they are unchanged from previous note.  ALLERGIES:  has No Known Allergies.  MEDICATIONS:  Current Outpatient Prescriptions  Medication Sig Dispense Refill  . cholecalciferol (VITAMIN D) 1000 units tablet Take 1,000 Units by mouth daily.    Marland Kitchen omeprazole (PRILOSEC) 20 MG capsule Take 1 capsule (20 mg total) by mouth daily. 90 capsule 0  . temazepam (RESTORIL) 15 MG capsule Take 15 mg by mouth at bedtime as needed for sleep.     No  current facility-administered medications for this visit.    Facility-Administered Medications Ordered in Other Visits  Medication Dose Route Frequency Provider Last Rate Last Dose  . heparin lock flush 100 unit/mL  500 Units Intravenous Once Lennis Marion Downer, MD      . heparin lock flush 100 unit/mL  500 Units Intracatheter Once PRN Heath Lark, MD      . sodium chloride flush (NS) 0.9 % injection 10 mL  10 mL Intravenous PRN Lennis P Livesay, MD      . sodium chloride flush (NS) 0.9 % injection 10 mL  10 mL Intracatheter PRN Heath Lark, MD        PHYSICAL EXAMINATION: ECOG PERFORMANCE STATUS: 1 - Symptomatic but completely ambulatory  Vitals:   06/27/16 0922  BP: 126/68  Pulse: 64  Resp: 16  Temp: 98.8 F (37.1 C)   There were no vitals filed for this visit.  GENERAL:alert, no distress and comfortable SKIN: skin color, texture, turgor are normal, no rashes or significant lesions EYES: normal, Conjunctiva are pink and non-injected, sclera clear OROPHARYNX:no exudate, no erythema and lips, buccal mucosa, and tongue normal  NECK: supple, thyroid normal size, non-tender, without nodularity LYMPH:  no palpable lymphadenopathy in the cervical, axillary or inguinal LUNGS: clear to auscultation and percussion with normal breathing effort HEART: regular rate & rhythm and no murmurs and no lower extremity edema ABDOMEN:abdomen soft, non-tender and normal bowel sounds Musculoskeletal:no cyanosis of digits and no clubbing  NEURO: alert & oriented x 3  with fluent speech, no focal motor/sensory deficits  LABORATORY DATA:  I have reviewed the data as listed    Component Value Date/Time   NA 142 06/27/2016 0813   K 3.9 06/27/2016 0813   CL 109 05/22/2015 0522   CO2 25 06/27/2016 0813   GLUCOSE 102 06/27/2016 0813   BUN 12.4 06/27/2016 0813   CREATININE 1.0 06/27/2016 0813   CALCIUM 9.0 06/27/2016 0813   PROT 6.8 06/27/2016 0813   ALBUMIN 3.5 06/27/2016 0813   AST 21 06/27/2016 0813    ALT 20 06/27/2016 0813   ALKPHOS 107 06/27/2016 0813   BILITOT 0.32 06/27/2016 0813   GFRNONAA >60 05/22/2015 0522   GFRAA >60 05/22/2015 0522    No results found for: SPEP, UPEP  Lab Results  Component Value Date   WBC 3.4 (L) 06/27/2016   NEUTROABS 1.4 (L) 06/27/2016   HGB 10.4 (L) 06/27/2016   HCT 31.8 (L) 06/27/2016   MCV 89.8 06/27/2016   PLT 252 06/27/2016      Chemistry      Component Value Date/Time   NA 142 06/27/2016 0813   K 3.9 06/27/2016 0813   CL 109 05/22/2015 0522   CO2 25 06/27/2016 0813   BUN 12.4 06/27/2016 0813   CREATININE 1.0 06/27/2016 0813      Component Value Date/Time   CALCIUM 9.0 06/27/2016 0813   ALKPHOS 107 06/27/2016 0813   AST 21 06/27/2016 0813   ALT 20 06/27/2016 0813   BILITOT 0.32 06/27/2016 0813       ASSESSMENT & PLAN:  Cervical cancer ([place]) She has minor side effect with nausea and mild peripheral neuropathy We have to hold and skip treatment recently due to pancytopenia I will keep cisplatin at 25 milligrams per meter squared and reduce gemcitabine to 500 mg/m I plan to repeat imaging study after 3 cycles of treatment     Metastases to the liver ([place]) I cannot see the liver metastasis anymore on recent imaging.  She responded to recent treatment with normal liver function  Malignant neoplasm metastatic to right lung [facility]) She is not symptomatic from lung metastasis  Chemotherapy-induced peripheral neuropathy ([place]) She has trace peripheral neuropathy from prior treatment. I plan to reduce the dose of cisplatin as above  Pancytopenia, acquired ([place]) She is not symptomatic from pancytopenia I will plan to reduce dose of chemotherapy in the future as outlined above   No orders of the defined types were placed in this encounter.  All questions were answered. The patient knows to call the clinic with any problems, questions or concerns. No barriers to learning was detected. I spent 15 minutes counseling the  patient face to face. The total time spent in the appointment was 20 minutes and more than 50% was on counseling and review of test results     Heath Lark, MD 06/28/2016 7:51 AM

## 2016-06-29 ENCOUNTER — Ambulatory Visit: Payer: Medicare HMO

## 2016-06-29 ENCOUNTER — Other Ambulatory Visit: Payer: Medicare HMO

## 2016-06-30 ENCOUNTER — Telehealth: Payer: Self-pay | Admitting: Hematology and Oncology

## 2016-06-30 NOTE — Telephone Encounter (Signed)
Not able to reach patient re additional May/June appointments added per 4/25 schedule message. Schedule mailed and patient will also get updated schedule at 5/3 visit.

## 2016-07-05 ENCOUNTER — Ambulatory Visit: Payer: Medicare HMO

## 2016-07-05 ENCOUNTER — Other Ambulatory Visit: Payer: Medicare HMO

## 2016-07-09 ENCOUNTER — Telehealth: Payer: Self-pay | Admitting: Family Medicine

## 2016-07-09 ENCOUNTER — Telehealth: Payer: Self-pay

## 2016-07-09 ENCOUNTER — Ambulatory Visit: Payer: Self-pay | Admitting: Family Medicine

## 2016-07-09 NOTE — Telephone Encounter (Signed)
Pt was on Dr Hulen Shouts schedule today at [facility]. Dr Deborra Medina does not do fecal impactions and had me call the pt to advise her to see her GI specialist since she has a colostomy. We checked with Dr Silvio Pate and he said he would not be able to do it, either. I spoke to the pt. She will call GI.

## 2016-07-09 NOTE — Telephone Encounter (Signed)
Just getting message now. Reviewed phone notes in chart. I agree this sounds more complicated than just fecal impaction - plz call to ensure she was able to be seen - likely needs gen surgery or whoever did her colostomy.

## 2016-07-09 NOTE — Telephone Encounter (Signed)
Spoke to pt. After clarifying, she does not have a GI. She has placed a call to the Gen Surg Dr Marcello Moores. She is waiting for a call back. Please advise if you have any suggestions.

## 2016-07-09 NOTE — Telephone Encounter (Signed)
Patient Name: Katherine Boone  DOB: [birth year]    Initial Comment Caller says, colostomy bag, stool is not coming through. it is coming down in her rectum instead. She wants to get an appt to have this looked at.    Nurse Assessment  Nurse: Raphael Gibney, RN, Vanita Ingles Date/Time (Eastern Time): 07/09/2016 10:59:33 AM  Confirm and document reason for call. If symptomatic, describe symptoms. ---Caller states she has a colostomy bag. Normally has liquid stool through the bag. Had regular stools through her colostomy bag last week. Last stool in her colostomy was 2 weeks ago. having stools coming from her rectum. Stool is hard in her rectum. Had chemo 2 weeks ago. Having rectal pressure. Tried to get stool out last night and had a little bleeding.  Does the patient have any new or worsening symptoms? ---Yes  Will a triage be completed? ---Yes  Related visit to physician within the last 2 weeks? ---No  Does the PT have any chronic conditions? (i.e. diabetes, asthma, etc.) ---Yes  List chronic conditions. ---cervical cancer;  Is this a behavioral health or substance abuse call? ---No     Guidelines    Guideline Title Affirmed Question Affirmed Notes  Cancer - Constipation Leaking stool    Final Disposition User   See Physician within 24 Hours [employer] [facility], RN, Vanita Ingles    Comments  appt scheduled for 3 pm 07/09/2016 with Dr. Arnette Norris   Referrals  REFERRED TO PCP OFFICE   Disagree/Comply: Comply

## 2016-07-10 NOTE — Telephone Encounter (Signed)
Agree needs gen surg eval.

## 2016-07-19 ENCOUNTER — Telehealth: Payer: Self-pay | Admitting: Hematology and Oncology

## 2016-07-19 ENCOUNTER — Other Ambulatory Visit (HOSPITAL_BASED_OUTPATIENT_CLINIC_OR_DEPARTMENT_OTHER): Payer: Medicare HMO

## 2016-07-19 ENCOUNTER — Ambulatory Visit (HOSPITAL_BASED_OUTPATIENT_CLINIC_OR_DEPARTMENT_OTHER): Payer: Medicare HMO

## 2016-07-19 ENCOUNTER — Ambulatory Visit (HOSPITAL_BASED_OUTPATIENT_CLINIC_OR_DEPARTMENT_OTHER): Payer: Medicare HMO | Admitting: Hematology and Oncology

## 2016-07-19 ENCOUNTER — Ambulatory Visit: Payer: Medicare HMO

## 2016-07-19 ENCOUNTER — Encounter: Payer: Self-pay | Admitting: Hematology and Oncology

## 2016-07-19 VITALS — BP 121/54 | HR 71 | Temp 98.0°F | Resp 18 | Ht 64.5 in | Wt 168.9 lb

## 2016-07-19 DIAGNOSIS — K5909 Other constipation: Secondary | ICD-10-CM | POA: Diagnosis not present

## 2016-07-19 DIAGNOSIS — D61818 Other pancytopenia: Secondary | ICD-10-CM

## 2016-07-19 DIAGNOSIS — C539 Malignant neoplasm of cervix uteri, unspecified: Secondary | ICD-10-CM

## 2016-07-19 DIAGNOSIS — C7989 Secondary malignant neoplasm of other specified sites: Secondary | ICD-10-CM

## 2016-07-19 DIAGNOSIS — Z5111 Encounter for antineoplastic chemotherapy: Secondary | ICD-10-CM | POA: Diagnosis not present

## 2016-07-19 DIAGNOSIS — C7801 Secondary malignant neoplasm of right lung: Secondary | ICD-10-CM

## 2016-07-19 DIAGNOSIS — C7802 Secondary malignant neoplasm of left lung: Secondary | ICD-10-CM | POA: Diagnosis not present

## 2016-07-19 DIAGNOSIS — C799 Secondary malignant neoplasm of unspecified site: Secondary | ICD-10-CM

## 2016-07-19 DIAGNOSIS — C787 Secondary malignant neoplasm of liver and intrahepatic bile duct: Secondary | ICD-10-CM

## 2016-07-19 DIAGNOSIS — G620 Drug-induced polyneuropathy: Secondary | ICD-10-CM

## 2016-07-19 DIAGNOSIS — T451X5A Adverse effect of antineoplastic and immunosuppressive drugs, initial encounter: Secondary | ICD-10-CM

## 2016-07-19 LAB — COMPREHENSIVE METABOLIC PANEL
ALBUMIN: 3.7 g/dL (ref 3.5–5.0)
ALK PHOS: 96 U/L (ref 40–150)
ALT: 12 U/L (ref 0–55)
ANION GAP: 8 meq/L (ref 3–11)
AST: 15 U/L (ref 5–34)
BILIRUBIN TOTAL: 0.38 mg/dL (ref 0.20–1.20)
BUN: 13.6 mg/dL (ref 7.0–26.0)
CALCIUM: 9.3 mg/dL (ref 8.4–10.4)
CHLORIDE: 108 meq/L (ref 98–109)
CO2: 25 mEq/L (ref 22–29)
CREATININE: 1 mg/dL (ref 0.6–1.1)
EGFR: 57 mL/min/{1.73_m2} — ABNORMAL LOW (ref >90)
Glucose: 91 mg/dl (ref 70–140)
Potassium: 3.9 mEq/L (ref 3.5–5.1)
Sodium: 141 mEq/L (ref 136–145)
Total Protein: 7.1 g/dL (ref 6.4–8.3)

## 2016-07-19 LAB — CBC WITH DIFFERENTIAL/PLATELET
BASO%: 0.8 % (ref 0.0–2.0)
BASOS ABS: 0 10*3/uL (ref 0.0–0.1)
EOS%: 2.6 % (ref 0.0–7.0)
Eosinophils Absolute: 0.1 10*3/uL (ref 0.0–0.5)
HEMATOCRIT: 32.8 % — AB (ref 34.8–46.6)
HEMOGLOBIN: 10.7 g/dL — AB (ref 11.6–15.9)
LYMPH#: 1.7 10*3/uL (ref 0.9–3.3)
LYMPH%: 45.8 % (ref 14.0–49.7)
MCH: 29.9 pg (ref 25.1–34.0)
MCHC: 32.6 g/dL (ref 31.5–36.0)
MCV: 91.6 fL (ref 79.5–101.0)
MONO#: 0.5 10*3/uL (ref 0.1–0.9)
MONO%: 13.2 % (ref 0.0–14.0)
NEUT#: 1.4 10*3/uL — ABNORMAL LOW (ref 1.5–6.5)
NEUT%: 37.6 % — AB (ref 38.4–76.8)
PLATELETS: 229 10*3/uL (ref 145–400)
RBC: 3.58 10*6/uL — ABNORMAL LOW (ref 3.70–5.45)
RDW: 15.5 % — AB (ref 11.2–14.5)
WBC: 3.8 10*3/uL — ABNORMAL LOW (ref 3.9–10.3)

## 2016-07-19 LAB — MAGNESIUM: Magnesium: 1.7 mg/dl (ref 1.5–2.5)

## 2016-07-19 MED ORDER — PALONOSETRON HCL INJECTION 0.25 MG/5ML
0.2500 mg | Freq: Once | INTRAVENOUS | Status: AC
  Administered 2016-07-19: 0.25 mg via INTRAVENOUS

## 2016-07-19 MED ORDER — SODIUM CHLORIDE 0.9 % IV SOLN
500.0000 mg/m2 | Freq: Once | INTRAVENOUS | Status: AC
  Administered 2016-07-19: 950 mg via INTRAVENOUS
  Filled 2016-07-19: qty 24.98

## 2016-07-19 MED ORDER — HEPARIN SOD (PORK) LOCK FLUSH 100 UNIT/ML IV SOLN
500.0000 [IU] | Freq: Once | INTRAVENOUS | Status: AC | PRN
  Administered 2016-07-19: 500 [IU]
  Filled 2016-07-19: qty 5

## 2016-07-19 MED ORDER — SODIUM CHLORIDE 0.9% FLUSH
10.0000 mL | INTRAVENOUS | Status: DC | PRN
  Administered 2016-07-19: 10 mL
  Filled 2016-07-19: qty 10

## 2016-07-19 MED ORDER — CISPLATIN CHEMO INJECTION 50 MG/50ML
26.5000 mg/m2 | Freq: Once | INTRAVENOUS | Status: AC
  Administered 2016-07-19: 50 mg via INTRAVENOUS
  Filled 2016-07-19: qty 50

## 2016-07-19 MED ORDER — PALONOSETRON HCL INJECTION 0.25 MG/5ML
INTRAVENOUS | Status: AC
  Filled 2016-07-19: qty 5

## 2016-07-19 MED ORDER — SODIUM CHLORIDE 0.9 % IV SOLN
Freq: Once | INTRAVENOUS | Status: AC
  Administered 2016-07-19: 12:00:00 via INTRAVENOUS

## 2016-07-19 MED ORDER — SODIUM CHLORIDE 0.9 % IV SOLN
Freq: Once | INTRAVENOUS | Status: AC
Start: 2016-07-19 — End: 2016-07-19
  Administered 2016-07-19: 12:00:00 via INTRAVENOUS
  Filled 2016-07-19: qty 5

## 2016-07-19 MED ORDER — POTASSIUM CHLORIDE 2 MEQ/ML IV SOLN
Freq: Once | INTRAVENOUS | Status: AC
  Administered 2016-07-19: 10:00:00 via INTRAVENOUS
  Filled 2016-07-19: qty 10

## 2016-07-19 MED ORDER — SODIUM CHLORIDE 0.9% FLUSH
10.0000 mL | Freq: Once | INTRAVENOUS | Status: AC
  Administered 2016-07-19: 10 mL
  Filled 2016-07-19: qty 10

## 2016-07-19 NOTE — Patient Instructions (Signed)
Here is some information for when you have your port inserted:  Implanted [facility] Guide An implanted port is a type of central line that is placed under the skin. Central lines are used to provide IV access when treatment or nutrition needs to be given through a person's veins. Implanted ports are used for long-term IV access. An implanted port may be placed because:   You need IV medicine that would be irritating to the small veins in your hands or arms.   You need long-term IV medicines, such as antibiotics.   You need IV nutrition for a long period.   You need frequent blood draws for lab tests.   You need dialysis.  Implanted ports are usually placed in the chest area, but they can also be placed in the upper arm, the abdomen, or the leg. An implanted port has two main parts:   Reservoir. The reservoir is round and will appear as a small, raised area under your skin. The reservoir is the part where a needle is inserted to give medicines or draw blood.   Catheter. The catheter is a thin, flexible tube that extends from the reservoir. The catheter is placed into a large vein. Medicine that is inserted into the reservoir goes into the catheter and then into the vein.  HOW WILL I CARE FOR MY INCISION SITE? Do not get the incision site wet. Bathe or shower as directed by your health care provider.  HOW IS MY PORT ACCESSED? Special steps must be taken to access the port:   Before the port is accessed, a numbing cream can be placed on the skin. This helps numb the skin over the port site.   Your health care provider uses a sterile technique to access the port.  Your health care provider must put on a mask and sterile gloves.  The skin over your port is cleaned carefully with an antiseptic and allowed to dry.  The port is gently pinched between sterile gloves, and a needle is inserted into the port.  Only "non-coring" port needles should be used to access the port. Once the  port is accessed, a blood return should be checked. This helps ensure that the port is in the vein and is not clogged.   If your port needs to remain accessed for a constant infusion, a clear (transparent) bandage will be placed over the needle site. The bandage and needle will need to be changed every week, or as directed by your health care provider.   Keep the bandage covering the needle clean and dry. Do not get it wet. Follow your health care provider's instructions on how to take a shower or bath while the port is accessed.   If your port does not need to stay accessed, no bandage is needed over the port.  WHAT IS FLUSHING? Flushing helps keep the port from getting clogged. Follow your health care provider's instructions on how and when to flush the port. Ports are usually flushed with saline solution or a medicine called heparin. The need for flushing will depend on how the port is used.   If the port is used for intermittent medicines or blood draws, the port will need to be flushed:   After medicines have been given.   After blood has been drawn.   As part of routine maintenance.   If a constant infusion is running, the port may not need to be flushed.  HOW LONG WILL MY PORT STAY IMPLANTED?  The port can stay in for as long as your health care provider thinks it is needed. When it is time for the port to come out, surgery will be done to remove it. The procedure is similar to the one performed when the port was put in.  WHEN SHOULD I SEEK IMMEDIATE MEDICAL CARE? When you have an implanted port, you should seek immediate medical care if:   You notice a bad smell coming from the incision site.   You have swelling, redness, or drainage at the incision site.   You have more swelling or pain at the port site or the surrounding area.   You have a fever that is not controlled with medicine.   This information is not intended to replace advice given to you by your health  care provider. Make sure you discuss any questions you have with your health care provider.   Document Released: 02/19/2005 Document Revised: 12/10/2012 Document Reviewed: 10/27/2012 Elsevier Interactive Patient Education [employer].

## 2016-07-19 NOTE — Assessment & Plan Note (Signed)
She has recent constipation secondary to chemotherapy and premedications given during chemo I recommend laxatives for several days after each dose of chemo to prevent severe constipation.

## 2016-07-19 NOTE — Assessment & Plan Note (Signed)
She is not symptomatic from lung metastasis 

## 2016-07-19 NOTE — Patient Instructions (Signed)
[facility] Discharge Instructions for Patients Receiving Chemotherapy  Today you received the following chemotherapy agents gemzar/cisplatin   To help prevent nausea and vomiting after your treatment, we encourage you to take your nausea medication as directed  If you develop nausea and vomiting that is not controlled by your nausea medication, call the clinic.   BELOW ARE SYMPTOMS THAT SHOULD BE REPORTED IMMEDIATELY:  *FEVER GREATER THAN 100.5 F  *CHILLS WITH OR WITHOUT FEVER  NAUSEA AND VOMITING THAT IS NOT CONTROLLED WITH YOUR NAUSEA MEDICATION  *UNUSUAL SHORTNESS OF BREATH  *UNUSUAL BRUISING OR BLEEDING  TENDERNESS IN MOUTH AND THROAT WITH OR WITHOUT PRESENCE OF ULCERS  *URINARY PROBLEMS  *BOWEL PROBLEMS  UNUSUAL RASH Items with * indicate a potential emergency and should be followed up as soon as possible.  Feel free to call the clinic you have any questions or concerns. The clinic phone number is [phone].  

## 2016-07-19 NOTE — Assessment & Plan Note (Signed)
She is not symptomatic from pancytopenia I will plan to reduce dose of chemotherapy in the future as outlined above The patient desired to take a break for a trip to the beach. After extensive discussion with the patient, we will delay day 8 of treatment until August 03, 2016. After that, she will receive the following treatment on June 14

## 2016-07-19 NOTE — Assessment & Plan Note (Signed)
She has trace peripheral neuropathy from prior treatment. I plan to reduce the dose of cisplatin as above 

## 2016-07-19 NOTE — Assessment & Plan Note (Addendum)
She has minor side effect with nausea, pancytopenia and mild peripheral neuropathy We have to hold and skip treatment recently due to pancytopenia I will keep cisplatin at 25 milligrams per meter squared and reduce gemcitabine to 500 mg/m I plan to repeat imaging study after 3 cycles of treatment, due in June

## 2016-07-19 NOTE — Progress Notes (Signed)
[facility] OFFICE PROGRESS NOTE  Patient Care Team: [name], MD as PCP - General (Family Medicine) Gordy Levan, MD as Consulting Physician (Oncology) Carmel Sacramento, OD as Consulting Physician (Optometry)  SUMMARY OF ONCOLOGIC HISTORY:   Cervical cancer ([place])   04/26/2015 Imaging    Numerous bilateral pulmonary nodules consistent with metastatic disease are associated with liver lesions, omental nodules, mesenteric nodules, necrotic retroperitoneal lymphadenopathy in a mixed cystic and solid mass in the central pelvis involving the uterus extending into the cul-de-sac and both adnexal regions. Uterine primary (likely endometrial) is favored over an ovarian primary malignancy given the lack of ascites, associated intrahepatic metastases and pulmonary involvement.      05/04/2015 Procedure    Technically successful CT-guided core biopsy of left pelvic mass      05/04/2015 Pathology Results    Soft Tissue Needle Core Biopsy, left ext iliac mass METASTATIC HIGH GRADE POORLY DIFFERENTIATED CARCINOMA CONSISTENT WITH CERVICAL ORIGIN Microscopic Comment The neoplasm stains positive for cervical markers : p16, ER, ck8/18, and p63 (focal), TTF-1 (focal) and negative for synaptophysin, Chromogranin (neuroendocrine markers) and ck5/6. The morphology and immunohistochemistry staining pattern support these cells are GYN cervical origin.Spec      05/10/2015 Imaging    Innumerable (> than 40) pulmonary nodules randomly distributed throughout both lungs, largest 1.1 cm, in keeping with pulmonary metastases. 2. Left supraclavicular and left hilar nodal metastases. 3. Re- demonstration of liver metastases. 4. Right thyroid lobe 1.4 cm pulmonary nodule, for which no further evaluation is recommended. 5. Stable mild superior T11 vertebral compression deformity.      05/12/2015 - 05/12/2015 Chemotherapy    She received 1 dose of carbo/taxol only      05/15/2015 Imaging    Widely  metastatic cervical carcinoma with unchanged appearance compared to 04/26/2015. Bulky tumor narrows and likely invades the rectum/distal sigmoid with progressive proximal stool retention      05/15/2015 - 05/23/2015 Hospital Admission    She was admitted for severe bowel obstruction requiring urgent diversion colostomy      05/18/2015 Surgery    She underwent LAPAROSCOPIC DIVERTING SIGMOID LOOP COLOSTOMY for bowel obstruction       06/10/2015 Procedure    Successful placement of a right internal jugular approach power injectable Port-A-Cath. The catheter is ready for immediate use      06/23/2015 - 03/01/2016 Chemotherapy    She received combination treatment with cisplatin, Taxol and Avastin. Avastin was started on 5/11. Dose of Taxol was modified due to neuropathy and cisplatin was omited last dose due to 'weakness'      09/13/2015 Imaging    Significant interval treatment response. Pulmonary and liver metastases have significantly decreased in size. Thoracic adenopathy has decreased in size. Mixed attenuation mass encompassing the uterus and bilateral adnexal regions has significantly decreased in size. No residual visualized peritoneal tumor implants. No new or progressive metastatic disease. 2. No evidence of bowel obstruction or acute bowel inflammation status post diverting sigmoid colostomy. 3. Additional findings include mild aortic atherosclerosis and Stable mild chronic superior T12 compression fracture.      12/14/2015 Imaging    Stable to improved interval exam. Multiple bilateral pulmonary nodules are stable in size to decreased and the liver lesions are stable to decreased. Mixed attenuation lesion encompassing the uterus and adnexal region shows decrease in size. There is no evidence for new or progressive disease on today's exam.      05/15/2016 Imaging    Findings today reflecting mixed interval  response to therapy. 2. There has been mild increase in size of pulmonary nodules.  3. Interval increase in size of pelvic mass 4. Further regression of liver metastases 5. There is a new soft tissue nodule within the right lower quadrant peritoneal cavity. Suspicious peritoneal disease      06/07/2016 -  Chemotherapy    She received chemo with cisplatin and gemzar      06/14/2016 Adverse Reaction    Cycle 1, day 8 is put on hold due to severe pancytopenia       INTERVAL HISTORY: Please see below for problem oriented charting. She returns for further follow-up. She had multiple dose changes due to pancytopenia. She had severe constipation after last dose of chemo She denies worsening neuropathy No hearing loss No nausea, vomiting or mucositis. She denies recent infection. Denies recent cough, chest pain or shortness of breath  REVIEW OF SYSTEMS:   Constitutional: Denies fevers, chills or abnormal weight loss Eyes: Denies blurriness of vision Ears, nose, mouth, throat, and face: Denies mucositis or sore throat Respiratory: Denies cough, dyspnea or wheezes Cardiovascular: Denies palpitation, chest discomfort or lower extremity swelling Skin: Denies abnormal skin rashes Lymphatics: Denies new lymphadenopathy or easy bruising Neurological:Denies numbness, tingling or new weaknesses Behavioral/Psych: Mood is stable, no new changes  All other systems were reviewed with the patient and are negative.  I have reviewed the past medical history, past surgical history, social history and family history with the patient and they are unchanged from previous note.  ALLERGIES:  has No Known Allergies.  MEDICATIONS:  Current Outpatient Prescriptions  Medication Sig Dispense Refill  . cholecalciferol (VITAMIN D) 1000 units tablet Take 1,000 Units by mouth daily.    Marland Kitchen omeprazole (PRILOSEC) 20 MG capsule Take 1 capsule (20 mg total) by mouth daily. 90 capsule 0  . temazepam (RESTORIL) 15 MG capsule Take 15 mg by mouth at bedtime as needed for sleep.     No current  facility-administered medications for this visit.    Facility-Administered Medications Ordered in Other Visits  Medication Dose Route Frequency Provider Last Rate Last Dose  . heparin lock flush 100 unit/mL  500 Units Intravenous Once Livesay, Lennis P, MD      . heparin lock flush 100 unit/mL  500 Units Intracatheter Once PRN Alvy Bimler, Thang Flett, MD      . sodium chloride flush (NS) 0.9 % injection 10 mL  10 mL Intravenous PRN Livesay, Lennis P, MD      . sodium chloride flush (NS) 0.9 % injection 10 mL  10 mL Intracatheter PRN Alvy Bimler, Mallerie Blok, MD        PHYSICAL EXAMINATION: ECOG PERFORMANCE STATUS: 1 - Symptomatic but completely ambulatory  Vitals:   07/19/16 0817  BP: (!) 121/54  Pulse: 71  Resp: 18  Temp: 98 F (36.7 C)   Filed Weights   07/19/16 0817  Weight: 168 lb 14.4 oz (76.6 kg)    GENERAL:alert, no distress and comfortable SKIN: skin color, texture, turgor are normal, no rashes or significant lesions EYES: normal, Conjunctiva are pink and non-injected, sclera clear OROPHARYNX:no exudate, no erythema and lips, buccal mucosa, and tongue normal  NECK: supple, thyroid normal size, non-tender, without nodularity LYMPH:  no palpable lymphadenopathy in the cervical, axillary or inguinal LUNGS: clear to auscultation and percussion with normal breathing effort HEART: regular rate & rhythm and no murmurs and no lower extremity edema ABDOMEN:abdomen soft, non-tender and normal bowel sounds Musculoskeletal:no cyanosis of digits and no clubbing  NEURO: alert &  oriented x 3 with fluent speech, no focal motor/sensory deficits  LABORATORY DATA:  I have reviewed the data as listed    Component Value Date/Time   NA 141 07/19/2016 0754   K 3.9 07/19/2016 0754   CL 109 05/22/2015 0522   CO2 25 07/19/2016 0754   GLUCOSE 91 07/19/2016 0754   BUN 13.6 07/19/2016 0754   CREATININE 1.0 07/19/2016 0754   CALCIUM 9.3 07/19/2016 0754   PROT 7.1 07/19/2016 0754   ALBUMIN 3.7 07/19/2016 0754    AST 15 07/19/2016 0754   ALT 12 07/19/2016 0754   ALKPHOS 96 07/19/2016 0754   BILITOT 0.38 07/19/2016 0754   GFRNONAA >60 05/22/2015 0522   GFRAA >60 05/22/2015 0522    No results found for: SPEP, UPEP  Lab Results  Component Value Date   WBC 3.8 (L) 07/19/2016   NEUTROABS 1.4 (L) 07/19/2016   HGB 10.7 (L) 07/19/2016   HCT 32.8 (L) 07/19/2016   MCV 91.6 07/19/2016   PLT 229 07/19/2016      Chemistry      Component Value Date/Time   NA 141 07/19/2016 0754   K 3.9 07/19/2016 0754   CL 109 05/22/2015 0522   CO2 25 07/19/2016 0754   BUN 13.6 07/19/2016 0754   CREATININE 1.0 07/19/2016 0754      Component Value Date/Time   CALCIUM 9.3 07/19/2016 0754   ALKPHOS 96 07/19/2016 0754   AST 15 07/19/2016 0754   ALT 12 07/19/2016 0754   BILITOT 0.38 07/19/2016 0754       ASSESSMENT & PLAN:  Cervical cancer ([place]) She has minor side effect with nausea, pancytopenia and mild peripheral neuropathy We have to hold and skip treatment recently due to pancytopenia I will keep cisplatin at 25 milligrams per meter squared and reduce gemcitabine to 500 mg/m I plan to repeat imaging study after 3 cycles of treatment, due in June  Pancytopenia, acquired [facility]) She is not symptomatic from pancytopenia I will plan to reduce dose of chemotherapy in the future as outlined above The patient desired to take a break for a trip to the beach. After extensive discussion with the patient, we will delay day 8 of treatment until August 03, 2016. After that, she will receive the following treatment on June 14  Malignant neoplasm metastatic to left lung [facility]) She is not symptomatic from lung metastasis  Chemotherapy-induced peripheral neuropathy ([place]) She has trace peripheral neuropathy from prior treatment. I plan to reduce the dose of cisplatin as above  Other constipation She has recent constipation secondary to chemotherapy and premedications given during chemo I recommend laxatives for  several days after each dose of chemo to prevent severe constipation.   Orders Placed This Encounter  Procedures  . CT CHEST W CONTRAST    Standing Status:   Future    Standing Expiration Date:   10/19/2017    Order Specific Question:   Reason for Exam (SYMPTOM  OR DIAGNOSIS REQUIRED)    Answer:   staging metastatic cervical ca,  assess response to Rx    Order Specific Question:   Preferred imaging location?    Answer:   [facility]  . CT ABDOMEN PELVIS W CONTRAST    Standing Status:   Future    Standing Expiration Date:   10/19/2017    Order Specific Question:   Reason for Exam (SYMPTOM  OR DIAGNOSIS REQUIRED)    Answer:   staging metastatic cervical ca,  assess response to Rx  Order Specific Question:   Preferred imaging location?    Answer:   [facility]   All questions were answered. The patient knows to call the clinic with any problems, questions or concerns. No barriers to learning was detected. I spent 25 minutes counseling the patient face to face. The total time spent in the appointment was 30 minutes and more than 50% was on counseling and review of test results     Heath Lark, MD 07/19/2016 8:41 AM

## 2016-07-19 NOTE — Telephone Encounter (Signed)
Appointments scheduled per 07/19/16 los. Patient was given a copy of the AVS report and appointment schedule per 07/19/16 los. °

## 2016-07-26 ENCOUNTER — Ambulatory Visit: Payer: Medicare HMO

## 2016-07-26 ENCOUNTER — Other Ambulatory Visit: Payer: Medicare HMO

## 2016-08-03 ENCOUNTER — Ambulatory Visit (HOSPITAL_BASED_OUTPATIENT_CLINIC_OR_DEPARTMENT_OTHER): Payer: Medicare HMO | Admitting: Hematology and Oncology

## 2016-08-03 ENCOUNTER — Other Ambulatory Visit (HOSPITAL_BASED_OUTPATIENT_CLINIC_OR_DEPARTMENT_OTHER): Payer: Medicare HMO

## 2016-08-03 ENCOUNTER — Ambulatory Visit (HOSPITAL_BASED_OUTPATIENT_CLINIC_OR_DEPARTMENT_OTHER): Payer: Medicare HMO

## 2016-08-03 VITALS — BP 133/71 | HR 73 | Temp 97.8°F | Resp 18

## 2016-08-03 DIAGNOSIS — C778 Secondary and unspecified malignant neoplasm of lymph nodes of multiple regions: Secondary | ICD-10-CM | POA: Diagnosis not present

## 2016-08-03 DIAGNOSIS — D61818 Other pancytopenia: Secondary | ICD-10-CM

## 2016-08-03 DIAGNOSIS — Z5111 Encounter for antineoplastic chemotherapy: Secondary | ICD-10-CM

## 2016-08-03 DIAGNOSIS — C799 Secondary malignant neoplasm of unspecified site: Secondary | ICD-10-CM

## 2016-08-03 DIAGNOSIS — C539 Malignant neoplasm of cervix uteri, unspecified: Secondary | ICD-10-CM

## 2016-08-03 DIAGNOSIS — K5909 Other constipation: Secondary | ICD-10-CM | POA: Diagnosis not present

## 2016-08-03 DIAGNOSIS — T451X5A Adverse effect of antineoplastic and immunosuppressive drugs, initial encounter: Secondary | ICD-10-CM

## 2016-08-03 DIAGNOSIS — C7801 Secondary malignant neoplasm of right lung: Secondary | ICD-10-CM

## 2016-08-03 DIAGNOSIS — C7802 Secondary malignant neoplasm of left lung: Secondary | ICD-10-CM

## 2016-08-03 DIAGNOSIS — G620 Drug-induced polyneuropathy: Secondary | ICD-10-CM

## 2016-08-03 DIAGNOSIS — C787 Secondary malignant neoplasm of liver and intrahepatic bile duct: Secondary | ICD-10-CM | POA: Diagnosis not present

## 2016-08-03 LAB — CBC WITH DIFFERENTIAL/PLATELET
BASO%: 0.3 % (ref 0.0–2.0)
Basophils Absolute: 0 10*3/uL (ref 0.0–0.1)
EOS ABS: 0.1 10*3/uL (ref 0.0–0.5)
EOS%: 3.8 % (ref 0.0–7.0)
HCT: 30.9 % — ABNORMAL LOW (ref 34.8–46.6)
HEMOGLOBIN: 10.1 g/dL — AB (ref 11.6–15.9)
LYMPH%: 34.2 % (ref 14.0–49.7)
MCH: 30.1 pg (ref 25.1–34.0)
MCHC: 32.7 g/dL (ref 31.5–36.0)
MCV: 92.2 fL (ref 79.5–101.0)
MONO#: 0.5 10*3/uL (ref 0.1–0.9)
MONO%: 13.9 % (ref 0.0–14.0)
NEUT%: 47.8 % (ref 38.4–76.8)
NEUTROS ABS: 1.8 10*3/uL (ref 1.5–6.5)
PLATELETS: 116 10*3/uL — AB (ref 145–400)
RBC: 3.35 10*6/uL — ABNORMAL LOW (ref 3.70–5.45)
RDW: 16.5 % — AB (ref 11.2–14.5)
WBC: 3.7 10*3/uL — ABNORMAL LOW (ref 3.9–10.3)
lymph#: 1.3 10*3/uL (ref 0.9–3.3)

## 2016-08-03 LAB — COMPREHENSIVE METABOLIC PANEL
ALBUMIN: 3.6 g/dL (ref 3.5–5.0)
ALK PHOS: 104 U/L (ref 40–150)
ALT: 11 U/L (ref 0–55)
AST: 14 U/L (ref 5–34)
Anion Gap: 10 mEq/L (ref 3–11)
BILIRUBIN TOTAL: 0.41 mg/dL (ref 0.20–1.20)
BUN: 13.8 mg/dL (ref 7.0–26.0)
CO2: 23 mEq/L (ref 22–29)
CREATININE: 1 mg/dL (ref 0.6–1.1)
Calcium: 9.2 mg/dL (ref 8.4–10.4)
Chloride: 109 mEq/L (ref 98–109)
EGFR: 55 mL/min/{1.73_m2} — ABNORMAL LOW (ref >90)
GLUCOSE: 116 mg/dL (ref 70–140)
Potassium: 4.1 mEq/L (ref 3.5–5.1)
SODIUM: 142 meq/L (ref 136–145)
TOTAL PROTEIN: 6.8 g/dL (ref 6.4–8.3)

## 2016-08-03 LAB — MAGNESIUM: Magnesium: 1.5 mg/dl (ref 1.5–2.5)

## 2016-08-03 MED ORDER — SODIUM CHLORIDE 0.9 % IV SOLN
26.5000 mg/m2 | Freq: Once | INTRAVENOUS | Status: AC
  Administered 2016-08-03: 50 mg via INTRAVENOUS
  Filled 2016-08-03: qty 50

## 2016-08-03 MED ORDER — DIPHENHYDRAMINE HCL 50 MG/ML IJ SOLN
25.0000 mg | Freq: Once | INTRAMUSCULAR | Status: AC
  Administered 2016-08-03: 25 mg via INTRAVENOUS

## 2016-08-03 MED ORDER — SODIUM CHLORIDE 0.9 % IV SOLN
Freq: Once | INTRAVENOUS | Status: AC
  Administered 2016-08-03: 12:00:00 via INTRAVENOUS
  Filled 2016-08-03: qty 5

## 2016-08-03 MED ORDER — METHYLPREDNISOLONE SODIUM SUCC 125 MG IJ SOLR
62.5000 mg | Freq: Once | INTRAMUSCULAR | Status: AC
  Administered 2016-08-03: 62.5 mg via INTRAVENOUS

## 2016-08-03 MED ORDER — SODIUM CHLORIDE 0.9% FLUSH
10.0000 mL | INTRAVENOUS | Status: DC | PRN
  Administered 2016-08-03 (×2): 10 mL
  Filled 2016-08-03: qty 10

## 2016-08-03 MED ORDER — PALONOSETRON HCL INJECTION 0.25 MG/5ML
INTRAVENOUS | Status: AC
  Filled 2016-08-03: qty 5

## 2016-08-03 MED ORDER — SODIUM CHLORIDE 0.9 % IV SOLN
Freq: Once | INTRAVENOUS | Status: AC
  Administered 2016-08-03: 08:00:00 via INTRAVENOUS

## 2016-08-03 MED ORDER — HEPARIN SOD (PORK) LOCK FLUSH 100 UNIT/ML IV SOLN
500.0000 [IU] | Freq: Once | INTRAVENOUS | Status: AC | PRN
  Administered 2016-08-03: 500 [IU]
  Filled 2016-08-03: qty 5

## 2016-08-03 MED ORDER — PALONOSETRON HCL INJECTION 0.25 MG/5ML
0.2500 mg | Freq: Once | INTRAVENOUS | Status: AC
  Administered 2016-08-03: 0.25 mg via INTRAVENOUS

## 2016-08-03 MED ORDER — SODIUM CHLORIDE 0.9 % IV SOLN
500.0000 mg/m2 | Freq: Once | INTRAVENOUS | Status: AC
  Administered 2016-08-03: 950 mg via INTRAVENOUS
  Filled 2016-08-03: qty 24.98

## 2016-08-03 MED ORDER — FAMOTIDINE IN NACL 20-0.9 MG/50ML-% IV SOLN
20.0000 mg | Freq: Once | INTRAVENOUS | Status: AC
  Administered 2016-08-03: 20 mg via INTRAVENOUS

## 2016-08-03 MED ORDER — POTASSIUM CHLORIDE 2 MEQ/ML IV SOLN
Freq: Once | INTRAVENOUS | Status: AC
  Administered 2016-08-03: 09:00:00 via INTRAVENOUS
  Filled 2016-08-03: qty 10

## 2016-08-03 NOTE — Progress Notes (Signed)
1450 pt stated she was "feeling itchy on arms, hands, legs, and back". On assessment pt was red in these areas. At this time Pt was finished with Cisplatin infusion, and had been started on her (500 mL) post-infusion hydration fluids, as seen on MAR. Hydration fluids stopped,1 L NS hung to gravity, hypersensitivity protocol started, medications administered according to [facility]. Dr Alvy Bimler assessed pt in tx area. Pt vitals as charted. Will continue to monitor. Started post hydration fluids according to [facility]. Will continue to monitor. Dr Alvy Bimler aware of re-starting post-hydration fluids. VSS. Pt stated she is no longer "itchy", and redness has diminished. Handed pt off to Melia B. RN @1630  with VSS.

## 2016-08-03 NOTE — Patient Instructions (Signed)
[facility] Discharge Instructions for Patients Receiving Chemotherapy  Today you received the following chemotherapy agents gemzar/cisplatin   To help prevent nausea and vomiting after your treatment, we encourage you to take your nausea medication as directed  If you develop nausea and vomiting that is not controlled by your nausea medication, call the clinic.   BELOW ARE SYMPTOMS THAT SHOULD BE REPORTED IMMEDIATELY:  *FEVER GREATER THAN 100.5 F  *CHILLS WITH OR WITHOUT FEVER  NAUSEA AND VOMITING THAT IS NOT CONTROLLED WITH YOUR NAUSEA MEDICATION  *UNUSUAL SHORTNESS OF BREATH  *UNUSUAL BRUISING OR BLEEDING  TENDERNESS IN MOUTH AND THROAT WITH OR WITHOUT PRESENCE OF ULCERS  *URINARY PROBLEMS  *BOWEL PROBLEMS  UNUSUAL RASH Items with * indicate a potential emergency and should be followed up as soon as possible.  Feel free to call the clinic you have any questions or concerns. The clinic phone number is [phone].  

## 2016-08-04 ENCOUNTER — Encounter: Payer: Self-pay | Admitting: Hematology and Oncology

## 2016-08-04 NOTE — Assessment & Plan Note (Signed)
She is not symptomatic from pancytopenia I will plan to continue reduced dose of chemotherapy as outlined above

## 2016-08-04 NOTE — Assessment & Plan Note (Signed)
She has minor side effect with nausea, fatigue, pancytopenia and mild peripheral neuropathy I will keep cisplatin at 25 milligrams per meter squared and reduce gemcitabine to 500 mg/m I plan to repeat imaging study after 3 cycles of treatment, due in June

## 2016-08-04 NOTE — Assessment & Plan Note (Signed)
She has trace peripheral neuropathy from prior treatment. I plan to reduce the dose of cisplatin as above 

## 2016-08-04 NOTE — Progress Notes (Signed)
[facility] OFFICE PROGRESS NOTE  Patient Care Team: [name], MD as PCP - General (Family Medicine) Gordy Levan, MD as Consulting Physician (Oncology) Carmel Sacramento, OD as Consulting Physician (Optometry)  SUMMARY OF ONCOLOGIC HISTORY:   Cervical cancer ([place])   04/26/2015 Imaging    Numerous bilateral pulmonary nodules consistent with metastatic disease are associated with liver lesions, omental nodules, mesenteric nodules, necrotic retroperitoneal lymphadenopathy in a mixed cystic and solid mass in the central pelvis involving the uterus extending into the cul-de-sac and both adnexal regions. Uterine primary (likely endometrial) is favored over an ovarian primary malignancy given the lack of ascites, associated intrahepatic metastases and pulmonary involvement.      05/04/2015 Procedure    Technically successful CT-guided core biopsy of left pelvic mass      05/04/2015 Pathology Results    Soft Tissue Needle Core Biopsy, left ext iliac mass METASTATIC HIGH GRADE POORLY DIFFERENTIATED CARCINOMA CONSISTENT WITH CERVICAL ORIGIN Microscopic Comment The neoplasm stains positive for cervical markers : p16, ER, ck8/18, and p63 (focal), TTF-1 (focal) and negative for synaptophysin, Chromogranin (neuroendocrine markers) and ck5/6. The morphology and immunohistochemistry staining pattern support these cells are GYN cervical origin.Spec      05/10/2015 Imaging    Innumerable (> than 40) pulmonary nodules randomly distributed throughout both lungs, largest 1.1 cm, in keeping with pulmonary metastases. 2. Left supraclavicular and left hilar nodal metastases. 3. Re- demonstration of liver metastases. 4. Right thyroid lobe 1.4 cm pulmonary nodule, for which no further evaluation is recommended. 5. Stable mild superior T11 vertebral compression deformity.      05/12/2015 - 05/12/2015 Chemotherapy    She received 1 dose of carbo/taxol only      05/15/2015 Imaging    Widely  metastatic cervical carcinoma with unchanged appearance compared to 04/26/2015. Bulky tumor narrows and likely invades the rectum/distal sigmoid with progressive proximal stool retention      05/15/2015 - 05/23/2015 Hospital Admission    She was admitted for severe bowel obstruction requiring urgent diversion colostomy      05/18/2015 Surgery    She underwent LAPAROSCOPIC DIVERTING SIGMOID LOOP COLOSTOMY for bowel obstruction       06/10/2015 Procedure    Successful placement of a right internal jugular approach power injectable Port-A-Cath. The catheter is ready for immediate use      06/23/2015 - 03/01/2016 Chemotherapy    She received combination treatment with cisplatin, Taxol and Avastin. Avastin was started on 5/11. Dose of Taxol was modified due to neuropathy and cisplatin was omited last dose due to 'weakness'      09/13/2015 Imaging    Significant interval treatment response. Pulmonary and liver metastases have significantly decreased in size. Thoracic adenopathy has decreased in size. Mixed attenuation mass encompassing the uterus and bilateral adnexal regions has significantly decreased in size. No residual visualized peritoneal tumor implants. No new or progressive metastatic disease. 2. No evidence of bowel obstruction or acute bowel inflammation status post diverting sigmoid colostomy. 3. Additional findings include mild aortic atherosclerosis and Stable mild chronic superior T12 compression fracture.      12/14/2015 Imaging    Stable to improved interval exam. Multiple bilateral pulmonary nodules are stable in size to decreased and the liver lesions are stable to decreased. Mixed attenuation lesion encompassing the uterus and adnexal region shows decrease in size. There is no evidence for new or progressive disease on today's exam.      05/15/2016 Imaging    Findings today reflecting mixed interval  response to therapy. 2. There has been mild increase in size of pulmonary nodules.  3. Interval increase in size of pelvic mass 4. Further regression of liver metastases 5. There is a new soft tissue nodule within the right lower quadrant peritoneal cavity. Suspicious peritoneal disease      06/07/2016 -  Chemotherapy    She received chemo with cisplatin and gemzar      06/14/2016 Adverse Reaction    Cycle 1, day 8 is put on hold due to severe pancytopenia       INTERVAL HISTORY: Please see below for problem oriented charting. Shee is seen in the treatment room for further chemotherapy She complain of fatigue Denies nausea or mucositis Constipation resolved with aggressive laxative therapy She has very trace peripheral neuropathy, stable  REVIEW OF SYSTEMS:   Constitutional: Denies fevers, chills or abnormal weight loss Eyes: Denies blurriness of vision Ears, nose, mouth, throat, and face: Denies mucositis or sore throat Respiratory: Denies cough, dyspnea or wheezes Cardiovascular: Denies palpitation, chest discomfort or lower extremity swelling Skin: Denies abnormal skin rashes Lymphatics: Denies new lymphadenopathy or easy bruising Neurological:Denies numbness, tingling or new weaknesses Behavioral/Psych: Mood is stable, no new changes  All other systems were reviewed with the patient and are negative.  I have reviewed the past medical history, past surgical history, social history and family history with the patient and they are unchanged from previous note.  ALLERGIES:  has No Known Allergies.  MEDICATIONS:  Current Outpatient Prescriptions  Medication Sig Dispense Refill  . cholecalciferol (VITAMIN D) 1000 units tablet Take 1,000 Units by mouth daily.    Marland Kitchen omeprazole (PRILOSEC) 20 MG capsule Take 1 capsule (20 mg total) by mouth daily. 90 capsule 0  . temazepam (RESTORIL) 15 MG capsule Take 15 mg by mouth at bedtime as needed for sleep.     No current facility-administered medications for this visit.    Facility-Administered Medications Ordered in  Other Visits  Medication Dose Route Frequency Provider Last Rate Last Dose  . heparin lock flush 100 unit/mL  500 Units Intravenous Once Livesay, Lennis P, MD      . heparin lock flush 100 unit/mL  500 Units Intracatheter Once PRN Alvy Bimler, Jasn Xia, MD      . sodium chloride flush (NS) 0.9 % injection 10 mL  10 mL Intravenous PRN Livesay, Lennis P, MD      . sodium chloride flush (NS) 0.9 % injection 10 mL  10 mL Intracatheter PRN Keianna Signer, MD        PHYSICAL EXAMINATION: ECOG PERFORMANCE STATUS: 1 - Symptomatic but completely ambulatory  There were no vitals filed for this visit. There were no vitals filed for this visit.  GENERAL:alert, no distress and comfortable SKIN: skin color, texture, turgor are normal, no rashes or significant lesions EYES: normal, Conjunctiva are pink and non-injected, sclera clear OROPHARYNX:no exudate, no erythema and lips, buccal mucosa, and tongue normal  NECK: supple, thyroid normal size, non-tender, without nodularity LYMPH:  no palpable lymphadenopathy in the cervical, axillary or inguinal LUNGS: clear to auscultation and percussion with normal breathing effort HEART: regular rate & rhythm and no murmurs and no lower extremity edema ABDOMEN:abdomen soft, non-tender and normal bowel sounds Musculoskeletal:no cyanosis of digits and no clubbing  NEURO: alert & oriented x 3 with fluent speech, no focal motor/sensory deficits  LABORATORY DATA:  I have reviewed the data as listed    Component Value Date/Time   NA 142 08/03/2016 0755   K 4.1 08/03/2016  0755   CL 109 05/22/2015 0522   CO2 23 08/03/2016 0755   GLUCOSE 116 08/03/2016 0755   BUN 13.8 08/03/2016 0755   CREATININE 1.0 08/03/2016 0755   CALCIUM 9.2 08/03/2016 0755   PROT 6.8 08/03/2016 0755   ALBUMIN 3.6 08/03/2016 0755   AST 14 08/03/2016 0755   ALT 11 08/03/2016 0755   ALKPHOS 104 08/03/2016 0755   BILITOT 0.41 08/03/2016 0755   GFRNONAA >60 05/22/2015 0522   GFRAA >60 05/22/2015 0522     No results found for: SPEP, UPEP  Lab Results  Component Value Date   WBC 3.7 (L) 08/03/2016   NEUTROABS 1.8 08/03/2016   HGB 10.1 (L) 08/03/2016   HCT 30.9 (L) 08/03/2016   MCV 92.2 08/03/2016   PLT 116 (L) 08/03/2016      Chemistry      Component Value Date/Time   NA 142 08/03/2016 0755   K 4.1 08/03/2016 0755   CL 109 05/22/2015 0522   CO2 23 08/03/2016 0755   BUN 13.8 08/03/2016 0755   CREATININE 1.0 08/03/2016 0755      Component Value Date/Time   CALCIUM 9.2 08/03/2016 0755   ALKPHOS 104 08/03/2016 0755   AST 14 08/03/2016 0755   ALT 11 08/03/2016 0755   BILITOT 0.41 08/03/2016 0755       ASSESSMENT & PLAN:  Cervical cancer ([place]) She has minor side effect with nausea, fatigue, pancytopenia and mild peripheral neuropathy I will keep cisplatin at 25 milligrams per meter squared and reduce gemcitabine to 500 mg/m I plan to repeat imaging study after 3 cycles of treatment, due in June  Pancytopenia, acquired [facility]) She is not symptomatic from pancytopenia I will plan to continue reduced dose of chemotherapy as outlined above  Other constipation She has recent constipation secondary to chemotherapy and premedications given during chemo I recommend laxatives for several days after each dose of chemo to prevent severe constipation.  Chemotherapy-induced peripheral neuropathy ([place]) She has trace peripheral neuropathy from prior treatment. I plan to reduce the dose of cisplatin as above   No orders of the defined types were placed in this encounter.  All questions were answered. The patient knows to call the clinic with any problems, questions or concerns. No barriers to learning was detected. I spent 15 minutes counseling the patient face to face. The total time spent in the appointment was 20 minutes and more than 50% was on counseling and review of test results     Heath Lark, MD 08/04/2016 8:48 AM

## 2016-08-04 NOTE — Assessment & Plan Note (Signed)
She has recent constipation secondary to chemotherapy and premedications given during chemo I recommend laxatives for several days after each dose of chemo to prevent severe constipation.

## 2016-08-07 ENCOUNTER — Telehealth: Payer: Self-pay | Admitting: Hematology and Oncology

## 2016-08-07 NOTE — Telephone Encounter (Signed)
Added lab/flush/chemo 6/21. Spoke with patient she will get new schedule at 6/14 visit.

## 2016-08-09 ENCOUNTER — Ambulatory Visit: Payer: Medicare HMO

## 2016-08-09 ENCOUNTER — Ambulatory Visit: Payer: Medicare HMO | Admitting: Hematology and Oncology

## 2016-08-09 ENCOUNTER — Other Ambulatory Visit: Payer: Medicare HMO

## 2016-08-15 ENCOUNTER — Telehealth: Payer: Self-pay

## 2016-08-15 NOTE — Telephone Encounter (Signed)
-----   Message from Loletta Parish sent at 08/15/2016 10:12 AM EDT ----- Regarding: RE: CT not scheduled CT Authorized.  Thanks  ----- Message ----- From: Heath Lark, MD Sent: 08/15/2016   6:56 AM To: Flo Shanks, RN, Loletta Parish Subject: CT not scheduled

## 2016-08-15 NOTE — Telephone Encounter (Signed)
Radiology Scheduler to call and schedule CT scan.

## 2016-08-16 ENCOUNTER — Encounter: Payer: Self-pay | Admitting: Hematology and Oncology

## 2016-08-16 ENCOUNTER — Ambulatory Visit: Payer: Medicare HMO

## 2016-08-16 ENCOUNTER — Ambulatory Visit (HOSPITAL_BASED_OUTPATIENT_CLINIC_OR_DEPARTMENT_OTHER): Payer: Medicare HMO

## 2016-08-16 ENCOUNTER — Other Ambulatory Visit (HOSPITAL_BASED_OUTPATIENT_CLINIC_OR_DEPARTMENT_OTHER): Payer: Medicare HMO

## 2016-08-16 ENCOUNTER — Ambulatory Visit (HOSPITAL_BASED_OUTPATIENT_CLINIC_OR_DEPARTMENT_OTHER): Payer: Medicare HMO | Admitting: Hematology and Oncology

## 2016-08-16 DIAGNOSIS — C539 Malignant neoplasm of cervix uteri, unspecified: Secondary | ICD-10-CM | POA: Diagnosis not present

## 2016-08-16 DIAGNOSIS — D61818 Other pancytopenia: Secondary | ICD-10-CM

## 2016-08-16 DIAGNOSIS — C7802 Secondary malignant neoplasm of left lung: Secondary | ICD-10-CM

## 2016-08-16 DIAGNOSIS — C787 Secondary malignant neoplasm of liver and intrahepatic bile duct: Secondary | ICD-10-CM | POA: Diagnosis not present

## 2016-08-16 DIAGNOSIS — C778 Secondary and unspecified malignant neoplasm of lymph nodes of multiple regions: Secondary | ICD-10-CM

## 2016-08-16 DIAGNOSIS — C7801 Secondary malignant neoplasm of right lung: Secondary | ICD-10-CM | POA: Diagnosis not present

## 2016-08-16 DIAGNOSIS — R5383 Other fatigue: Secondary | ICD-10-CM

## 2016-08-16 DIAGNOSIS — C799 Secondary malignant neoplasm of unspecified site: Secondary | ICD-10-CM

## 2016-08-16 DIAGNOSIS — Z95828 Presence of other vascular implants and grafts: Secondary | ICD-10-CM

## 2016-08-16 DIAGNOSIS — C7989 Secondary malignant neoplasm of other specified sites: Secondary | ICD-10-CM

## 2016-08-16 LAB — MAGNESIUM: MAGNESIUM: 1.6 mg/dL (ref 1.5–2.5)

## 2016-08-16 LAB — COMPREHENSIVE METABOLIC PANEL WITH GFR
ALT: 11 U/L (ref 0–55)
AST: 15 U/L (ref 5–34)
Albumin: 3.5 g/dL (ref 3.5–5.0)
Alkaline Phosphatase: 103 U/L (ref 40–150)
Anion Gap: 7 meq/L (ref 3–11)
BUN: 11.4 mg/dL (ref 7.0–26.0)
CO2: 25 meq/L (ref 22–29)
Calcium: 9.2 mg/dL (ref 8.4–10.4)
Chloride: 108 meq/L (ref 98–109)
Creatinine: 1.1 mg/dL (ref 0.6–1.1)
EGFR: 53 ml/min/1.73 m2 — ABNORMAL LOW (ref >90)
Glucose: 94 mg/dL (ref 70–140)
Potassium: 4.5 meq/L (ref 3.5–5.1)
Sodium: 141 meq/L (ref 136–145)
Total Bilirubin: 0.44 mg/dL (ref 0.20–1.20)
Total Protein: 6.5 g/dL (ref 6.4–8.3)

## 2016-08-16 LAB — CBC WITH DIFFERENTIAL/PLATELET
BASO%: 0.4 % (ref 0.0–2.0)
Basophils Absolute: 0 10e3/uL (ref 0.0–0.1)
EOS%: 1.4 % (ref 0.0–7.0)
Eosinophils Absolute: 0 10e3/uL (ref 0.0–0.5)
HCT: 28.3 % — ABNORMAL LOW (ref 34.8–46.6)
HGB: 9.6 g/dL — ABNORMAL LOW (ref 11.6–15.9)
LYMPH%: 37.8 % (ref 14.0–49.7)
MCH: 31 pg (ref 25.1–34.0)
MCHC: 33.8 g/dL (ref 31.5–36.0)
MCV: 91.6 fL (ref 79.5–101.0)
MONO#: 0.5 10e3/uL (ref 0.1–0.9)
MONO%: 15.1 % — ABNORMAL HIGH (ref 0.0–14.0)
NEUT#: 1.6 10e3/uL (ref 1.5–6.5)
NEUT%: 45.3 % (ref 38.4–76.8)
Platelets: 71 10e3/uL — ABNORMAL LOW (ref 145–400)
RBC: 3.09 10e6/uL — ABNORMAL LOW (ref 3.70–5.45)
RDW: 18.8 % — ABNORMAL HIGH (ref 11.2–14.5)
WBC: 3.4 10e3/uL — ABNORMAL LOW (ref 3.9–10.3)
lymph#: 1.3 10e3/uL (ref 0.9–3.3)

## 2016-08-16 MED ORDER — HEPARIN SOD (PORK) LOCK FLUSH 100 UNIT/ML IV SOLN
500.0000 [IU] | Freq: Once | INTRAVENOUS | Status: AC
  Administered 2016-08-16: 500 [IU] via INTRAVENOUS
  Filled 2016-08-16: qty 5

## 2016-08-16 MED ORDER — SODIUM CHLORIDE 0.9% FLUSH
10.0000 mL | Freq: Once | INTRAVENOUS | Status: AC
  Administered 2016-08-16: 10 mL
  Filled 2016-08-16: qty 10

## 2016-08-16 MED ORDER — SODIUM CHLORIDE 0.9% FLUSH
10.0000 mL | INTRAVENOUS | Status: DC | PRN
  Administered 2016-08-16: 10 mL via INTRAVENOUS
  Filled 2016-08-16: qty 10

## 2016-08-16 NOTE — Assessment & Plan Note (Addendum)
She is symptomatic from pancytopenia with excessive fatigue I plan to hold treatment today I will plan to continue reduced dose of chemotherapy next week if her blood count recovers

## 2016-08-16 NOTE — Assessment & Plan Note (Signed)
She tolerated treatment poorly with excessive fatigue and recurrent pancytopenia We will hold treatment today I recommend she keeps her chemo appointment next week She has repeat imaging study in 2 weeks and I plan to see her at the end of the month to assess response to treatment before we decide to pursue additional therapy She agree with the plan of care

## 2016-08-16 NOTE — Progress Notes (Signed)
NO treatment today per MD Alvy Bimler due to PLT count and patient feels weak. Will resume next week and then CT scan 6/27 and Patient agrees.

## 2016-08-16 NOTE — Patient Instructions (Signed)

## 2016-08-16 NOTE — Progress Notes (Signed)
[facility] OFFICE PROGRESS NOTE  Patient Care Team: [name], MD as PCP - General (Family Medicine) Gordy Levan, MD as Consulting Physician (Oncology) Carmel Sacramento, OD as Consulting Physician (Optometry)  SUMMARY OF ONCOLOGIC HISTORY:   Cervical cancer ([place])   04/26/2015 Imaging    Numerous bilateral pulmonary nodules consistent with metastatic disease are associated with liver lesions, omental nodules, mesenteric nodules, necrotic retroperitoneal lymphadenopathy in a mixed cystic and solid mass in the central pelvis involving the uterus extending into the cul-de-sac and both adnexal regions. Uterine primary (likely endometrial) is favored over an ovarian primary malignancy given the lack of ascites, associated intrahepatic metastases and pulmonary involvement.      05/04/2015 Procedure    Technically successful CT-guided core biopsy of left pelvic mass      05/04/2015 Pathology Results    Soft Tissue Needle Core Biopsy, left ext iliac mass METASTATIC HIGH GRADE POORLY DIFFERENTIATED CARCINOMA CONSISTENT WITH CERVICAL ORIGIN Microscopic Comment The neoplasm stains positive for cervical markers : p16, ER, ck8/18, and p63 (focal), TTF-1 (focal) and negative for synaptophysin, Chromogranin (neuroendocrine markers) and ck5/6. The morphology and immunohistochemistry staining pattern support these cells are GYN cervical origin.Spec      05/10/2015 Imaging    Innumerable (> than 40) pulmonary nodules randomly distributed throughout both lungs, largest 1.1 cm, in keeping with pulmonary metastases. 2. Left supraclavicular and left hilar nodal metastases. 3. Re- demonstration of liver metastases. 4. Right thyroid lobe 1.4 cm pulmonary nodule, for which no further evaluation is recommended. 5. Stable mild superior T11 vertebral compression deformity.      05/12/2015 - 05/12/2015 Chemotherapy    She received 1 dose of carbo/taxol only      05/15/2015 Imaging    Widely  metastatic cervical carcinoma with unchanged appearance compared to 04/26/2015. Bulky tumor narrows and likely invades the rectum/distal sigmoid with progressive proximal stool retention      05/15/2015 - 05/23/2015 Hospital Admission    She was admitted for severe bowel obstruction requiring urgent diversion colostomy      05/18/2015 Surgery    She underwent LAPAROSCOPIC DIVERTING SIGMOID LOOP COLOSTOMY for bowel obstruction       06/10/2015 Procedure    Successful placement of a right internal jugular approach power injectable Port-A-Cath. The catheter is ready for immediate use      06/23/2015 - 03/01/2016 Chemotherapy    She received combination treatment with cisplatin, Taxol and Avastin. Avastin was started on 5/11. Dose of Taxol was modified due to neuropathy and cisplatin was omited last dose due to 'weakness'      09/13/2015 Imaging    Significant interval treatment response. Pulmonary and liver metastases have significantly decreased in size. Thoracic adenopathy has decreased in size. Mixed attenuation mass encompassing the uterus and bilateral adnexal regions has significantly decreased in size. No residual visualized peritoneal tumor implants. No new or progressive metastatic disease. 2. No evidence of bowel obstruction or acute bowel inflammation status post diverting sigmoid colostomy. 3. Additional findings include mild aortic atherosclerosis and Stable mild chronic superior T12 compression fracture.      12/14/2015 Imaging    Stable to improved interval exam. Multiple bilateral pulmonary nodules are stable in size to decreased and the liver lesions are stable to decreased. Mixed attenuation lesion encompassing the uterus and adnexal region shows decrease in size. There is no evidence for new or progressive disease on today's exam.      05/15/2016 Imaging    Findings today reflecting mixed interval  response to therapy. 2. There has been mild increase in size of pulmonary nodules.  3. Interval increase in size of pelvic mass 4. Further regression of liver metastases 5. There is a new soft tissue nodule within the right lower quadrant peritoneal cavity. Suspicious peritoneal disease      06/07/2016 -  Chemotherapy    She received chemo with cisplatin and gemzar      06/14/2016 Adverse Reaction    Cycle 1, day 8 is put on hold due to severe pancytopenia       INTERVAL HISTORY: Please see below for problem oriented charting. She returns for further follow-up She complain of excessive fatigue Denies mucositis, nausea or recent infection Denies significant peripheral neuropathy No hearing loss The patient denies any recent signs or symptoms of bleeding such as spontaneous epistaxis, hematuria or hematochezia.   REVIEW OF SYSTEMS:   Constitutional: Denies fevers, chills or abnormal weight loss Eyes: Denies blurriness of vision Ears, nose, mouth, throat, and face: Denies mucositis or sore throat Respiratory: Denies cough, dyspnea or wheezes Cardiovascular: Denies palpitation, chest discomfort or lower extremity swelling Gastrointestinal:  Denies nausea, heartburn or change in bowel habits Skin: Denies abnormal skin rashes Lymphatics: Denies new lymphadenopathy or easy bruising Neurological:Denies numbness, tingling or new weaknesses Behavioral/Psych: Mood is stable, no new changes  All other systems were reviewed with the patient and are negative.  I have reviewed the past medical history, past surgical history, social history and family history with the patient and they are unchanged from previous note.  ALLERGIES:  has No Known Allergies.  MEDICATIONS:  Current Outpatient Prescriptions  Medication Sig Dispense Refill  . cholecalciferol (VITAMIN D) 1000 units tablet Take 1,000 Units by mouth daily.    Marland Kitchen omeprazole (PRILOSEC) 20 MG capsule Take 1 capsule (20 mg total) by mouth daily. 90 capsule 0  . temazepam (RESTORIL) 15 MG capsule Take 15 mg by mouth at  bedtime as needed for sleep.     No current facility-administered medications for this visit.    Facility-Administered Medications Ordered in Other Visits  Medication Dose Route Frequency Provider Last Rate Last Dose  . heparin lock flush 100 unit/mL  500 Units Intravenous Once Livesay, Lennis P, MD      . heparin lock flush 100 unit/mL  500 Units Intracatheter Once PRN Alvy Bimler, Monta Police, MD      . sodium chloride flush (NS) 0.9 % injection 10 mL  10 mL Intravenous PRN Livesay, Lennis P, MD      . sodium chloride flush (NS) 0.9 % injection 10 mL  10 mL Intracatheter PRN Alvy Bimler, Amariona Rathje, MD        PHYSICAL EXAMINATION: ECOG PERFORMANCE STATUS: 1 - Symptomatic but completely ambulatory GENERAL:alert, no distress and comfortable SKIN: skin color, texture, turgor are normal, no rashes or significant lesions Musculoskeletal:no cyanosis of digits and no clubbing  NEURO: alert & oriented x 3 with fluent speech, no focal motor/sensory deficits  LABORATORY DATA:  I have reviewed the data as listed    Component Value Date/Time   NA 141 08/16/2016 0814   K 4.5 08/16/2016 0814   CL 109 05/22/2015 0522   CO2 25 08/16/2016 0814   GLUCOSE 94 08/16/2016 0814   BUN 11.4 08/16/2016 0814   CREATININE 1.1 08/16/2016 0814   CALCIUM 9.2 08/16/2016 0814   PROT 6.5 08/16/2016 0814   ALBUMIN 3.5 08/16/2016 0814   AST 15 08/16/2016 0814   ALT 11 08/16/2016 0814   ALKPHOS 103 08/16/2016 0177  BILITOT 0.44 08/16/2016 0814   GFRNONAA >60 05/22/2015 0522   GFRAA >60 05/22/2015 0522    No results found for: SPEP, UPEP  Lab Results  Component Value Date   WBC 3.4 (L) 08/16/2016   NEUTROABS 1.6 08/16/2016   HGB 9.6 (L) 08/16/2016   HCT 28.3 (L) 08/16/2016   MCV 91.6 08/16/2016   PLT 71 (L) 08/16/2016      Chemistry      Component Value Date/Time   NA 141 08/16/2016 0814   K 4.5 08/16/2016 0814   CL 109 05/22/2015 0522   CO2 25 08/16/2016 0814   BUN 11.4 08/16/2016 0814   CREATININE 1.1 08/16/2016  0814      Component Value Date/Time   CALCIUM 9.2 08/16/2016 0814   ALKPHOS 103 08/16/2016 0814   AST 15 08/16/2016 0814   ALT 11 08/16/2016 0814   BILITOT 0.44 08/16/2016 0814       ASSESSMENT & PLAN:  Cervical cancer ([place]) She tolerated treatment poorly with excessive fatigue and recurrent pancytopenia We will hold treatment today I recommend she keeps her chemo appointment next week She has repeat imaging study in 2 weeks and I plan to see her at the end of the month to assess response to treatment before we decide to pursue additional therapy She agree with the plan of care  Pancytopenia, acquired ([place]) She is symptomatic from pancytopenia with excessive fatigue I plan to hold treatment today I will plan to continue reduced dose of chemotherapy next week if her blood count recovers  Other fatigue She has excessive fatigue It could be due to anemia and chemotherapy side effects but just to be sure, I will check TSH in her next visit   Orders Placed This Encounter  Procedures  . TSH    Standing Status:   Future    Standing Expiration Date:   09/20/2017   All questions were answered. The patient knows to call the clinic with any problems, questions or concerns. No barriers to learning was detected. I spent 15 minutes counseling the patient face to face. The total time spent in the appointment was 20 minutes and more than 50% was on counseling and review of test results     Heath Lark, MD 08/16/2016 3:34 PM

## 2016-08-16 NOTE — Assessment & Plan Note (Signed)
She has excessive fatigue It could be due to anemia and chemotherapy side effects but just to be sure, I will check TSH in her next visit

## 2016-08-17 DIAGNOSIS — Z85038 Personal history of other malignant neoplasm of large intestine: Secondary | ICD-10-CM | POA: Diagnosis not present

## 2016-08-17 DIAGNOSIS — Z933 Colostomy status: Secondary | ICD-10-CM | POA: Diagnosis not present

## 2016-08-22 ENCOUNTER — Telehealth: Payer: Self-pay | Admitting: Hematology and Oncology

## 2016-08-22 NOTE — Telephone Encounter (Signed)
Confirmed 6/27 appt at 1430 per sch msg

## 2016-08-23 ENCOUNTER — Ambulatory Visit (HOSPITAL_BASED_OUTPATIENT_CLINIC_OR_DEPARTMENT_OTHER): Payer: Medicare HMO

## 2016-08-23 ENCOUNTER — Ambulatory Visit: Payer: Medicare HMO

## 2016-08-23 ENCOUNTER — Other Ambulatory Visit (HOSPITAL_BASED_OUTPATIENT_CLINIC_OR_DEPARTMENT_OTHER): Payer: Medicare HMO

## 2016-08-23 VITALS — BP 129/76 | HR 75 | Temp 98.2°F | Resp 18

## 2016-08-23 DIAGNOSIS — C7801 Secondary malignant neoplasm of right lung: Secondary | ICD-10-CM

## 2016-08-23 DIAGNOSIS — Z79899 Other long term (current) drug therapy: Secondary | ICD-10-CM

## 2016-08-23 DIAGNOSIS — C539 Malignant neoplasm of cervix uteri, unspecified: Secondary | ICD-10-CM | POA: Diagnosis not present

## 2016-08-23 DIAGNOSIS — C787 Secondary malignant neoplasm of liver and intrahepatic bile duct: Secondary | ICD-10-CM | POA: Diagnosis not present

## 2016-08-23 DIAGNOSIS — C7989 Secondary malignant neoplasm of other specified sites: Secondary | ICD-10-CM

## 2016-08-23 DIAGNOSIS — C7802 Secondary malignant neoplasm of left lung: Secondary | ICD-10-CM

## 2016-08-23 DIAGNOSIS — C799 Secondary malignant neoplasm of unspecified site: Secondary | ICD-10-CM

## 2016-08-23 DIAGNOSIS — C778 Secondary and unspecified malignant neoplasm of lymph nodes of multiple regions: Secondary | ICD-10-CM

## 2016-08-23 DIAGNOSIS — Z5111 Encounter for antineoplastic chemotherapy: Secondary | ICD-10-CM

## 2016-08-23 DIAGNOSIS — R5383 Other fatigue: Secondary | ICD-10-CM

## 2016-08-23 LAB — COMPREHENSIVE METABOLIC PANEL
ALT: 13 U/L (ref 0–55)
ANION GAP: 7 meq/L (ref 3–11)
AST: 16 U/L (ref 5–34)
Albumin: 3.5 g/dL (ref 3.5–5.0)
Alkaline Phosphatase: 103 U/L (ref 40–150)
BUN: 16.7 mg/dL (ref 7.0–26.0)
CHLORIDE: 108 meq/L (ref 98–109)
CO2: 23 mEq/L (ref 22–29)
Calcium: 9.3 mg/dL (ref 8.4–10.4)
Creatinine: 1.1 mg/dL (ref 0.6–1.1)
EGFR: 53 mL/min/{1.73_m2} — AB (ref >90)
Glucose: 104 mg/dl (ref 70–140)
Potassium: 4.1 mEq/L (ref 3.5–5.1)
Sodium: 139 mEq/L (ref 136–145)
Total Bilirubin: 0.41 mg/dL (ref 0.20–1.20)
Total Protein: 6.6 g/dL (ref 6.4–8.3)

## 2016-08-23 LAB — CBC WITH DIFFERENTIAL/PLATELET
BASO%: 0.7 % (ref 0.0–2.0)
BASOS ABS: 0 10*3/uL (ref 0.0–0.1)
EOS ABS: 0.1 10*3/uL (ref 0.0–0.5)
EOS%: 1.9 % (ref 0.0–7.0)
HCT: 28.9 % — ABNORMAL LOW (ref 34.8–46.6)
HEMOGLOBIN: 9.8 g/dL — AB (ref 11.6–15.9)
LYMPH%: 45.3 % (ref 14.0–49.7)
MCH: 31.5 pg (ref 25.1–34.0)
MCHC: 33.9 g/dL (ref 31.5–36.0)
MCV: 92.9 fL (ref 79.5–101.0)
MONO#: 0.6 10*3/uL (ref 0.1–0.9)
MONO%: 19.2 % — AB (ref 0.0–14.0)
NEUT#: 1 10*3/uL — ABNORMAL LOW (ref 1.5–6.5)
NEUT%: 32.9 % — AB (ref 38.4–76.8)
Platelets: 207 10*3/uL (ref 145–400)
RBC: 3.11 10*6/uL — ABNORMAL LOW (ref 3.70–5.45)
RDW: 20 % — AB (ref 11.2–14.5)
WBC: 2.9 10*3/uL — ABNORMAL LOW (ref 3.9–10.3)
lymph#: 1.3 10*3/uL (ref 0.9–3.3)

## 2016-08-23 LAB — TSH: TSH: 1.384 m(IU)/L (ref 0.308–3.960)

## 2016-08-23 LAB — MAGNESIUM: Magnesium: 1.7 mg/dl (ref 1.5–2.5)

## 2016-08-23 MED ORDER — SODIUM CHLORIDE 0.9 % IV SOLN
Freq: Once | INTRAVENOUS | Status: AC
  Administered 2016-08-23: 12:00:00 via INTRAVENOUS
  Filled 2016-08-23: qty 5

## 2016-08-23 MED ORDER — PALONOSETRON HCL INJECTION 0.25 MG/5ML
INTRAVENOUS | Status: AC
  Filled 2016-08-23: qty 5

## 2016-08-23 MED ORDER — DIPHENHYDRAMINE HCL 50 MG/ML IJ SOLN
INTRAMUSCULAR | Status: AC
  Filled 2016-08-23: qty 1

## 2016-08-23 MED ORDER — SODIUM CHLORIDE 0.9 % IV SOLN
26.5000 mg/m2 | Freq: Once | INTRAVENOUS | Status: AC
  Administered 2016-08-23: 50 mg via INTRAVENOUS
  Filled 2016-08-23: qty 50

## 2016-08-23 MED ORDER — DIPHENHYDRAMINE HCL 12.5 MG/5ML PO ELIX
12.5000 mg | ORAL_SOLUTION | Freq: Once | ORAL | Status: AC
  Administered 2016-08-23: 12.5 mg via ORAL
  Filled 2016-08-23: qty 5

## 2016-08-23 MED ORDER — GEMCITABINE HCL CHEMO INJECTION 1 GM/26.3ML
500.0000 mg/m2 | Freq: Once | INTRAVENOUS | Status: AC
  Administered 2016-08-23: 950 mg via INTRAVENOUS
  Filled 2016-08-23: qty 24.98

## 2016-08-23 MED ORDER — FAMOTIDINE IN NACL 20-0.9 MG/50ML-% IV SOLN
INTRAVENOUS | Status: AC
Start: 2016-08-23 — End: 2016-08-23
  Filled 2016-08-23: qty 50

## 2016-08-23 MED ORDER — POTASSIUM CHLORIDE 2 MEQ/ML IV SOLN
Freq: Once | INTRAVENOUS | Status: AC
  Administered 2016-08-23: 10:00:00 via INTRAVENOUS
  Filled 2016-08-23: qty 10

## 2016-08-23 MED ORDER — METHYLPREDNISOLONE SODIUM SUCC 40 MG IJ SOLR
INTRAMUSCULAR | Status: AC
  Filled 2016-08-23: qty 1

## 2016-08-23 MED ORDER — PALONOSETRON HCL INJECTION 0.25 MG/5ML
0.2500 mg | Freq: Once | INTRAVENOUS | Status: AC
  Administered 2016-08-23: 0.25 mg via INTRAVENOUS

## 2016-08-23 MED ORDER — DIPHENHYDRAMINE HCL 50 MG/ML IJ SOLN: 12.5000 mg | Freq: Once | INTRAMUSCULAR | Status: DC

## 2016-08-23 MED ORDER — FAMOTIDINE IN NACL 20-0.9 MG/50ML-% IV SOLN
20.0000 mg | Freq: Once | INTRAVENOUS | Status: AC
  Administered 2016-08-23: 20 mg via INTRAVENOUS

## 2016-08-23 MED ORDER — HEPARIN SOD (PORK) LOCK FLUSH 100 UNIT/ML IV SOLN
500.0000 [IU] | Freq: Once | INTRAVENOUS | Status: AC | PRN
  Administered 2016-08-23: 500 [IU]
  Filled 2016-08-23: qty 5

## 2016-08-23 MED ORDER — SODIUM CHLORIDE 0.9% FLUSH
10.0000 mL | Freq: Once | INTRAVENOUS | Status: AC
  Administered 2016-08-23: 10 mL
  Filled 2016-08-23: qty 10

## 2016-08-23 MED ORDER — SODIUM CHLORIDE 0.9% FLUSH
10.0000 mL | INTRAVENOUS | Status: DC | PRN
  Administered 2016-08-23: 10 mL
  Filled 2016-08-23: qty 10

## 2016-08-23 MED ORDER — METHYLPREDNISOLONE SODIUM SUCC 40 MG IJ SOLR
40.0000 mg | Freq: Once | INTRAMUSCULAR | Status: AC
  Administered 2016-08-23: 40 mg via INTRAVENOUS

## 2016-08-23 NOTE — Patient Instructions (Signed)
[facility] Discharge Instructions for Patients Receiving Chemotherapy  Today you received the following chemotherapy agents:  Cisplatin, Gemzar  To help prevent nausea and vomiting after your treatment, we encourage you to take your nausea medication as prescribed.    If you develop nausea and vomiting that is not controlled by your nausea medication, call the clinic.   BELOW ARE SYMPTOMS THAT SHOULD BE REPORTED IMMEDIATELY:  *FEVER GREATER THAN 100.5 F  *CHILLS WITH OR WITHOUT FEVER  NAUSEA AND VOMITING THAT IS NOT CONTROLLED WITH YOUR NAUSEA MEDICATION  *UNUSUAL SHORTNESS OF BREATH  *UNUSUAL BRUISING OR BLEEDING  TENDERNESS IN MOUTH AND THROAT WITH OR WITHOUT PRESENCE OF ULCERS  *URINARY PROBLEMS  *BOWEL PROBLEMS  UNUSUAL RASH Items with * indicate a potential emergency and should be followed up as soon as possible.  Feel free to call the clinic you have any questions or concerns. The clinic phone number is [phone].  Please show the CHEMO ALERT CARD at check-in to the Emergency Department and triage nurse.   

## 2016-08-23 NOTE — Patient Instructions (Signed)

## 2016-08-23 NOTE — Progress Notes (Signed)
Per Dr Alvy Bimler, [place] to treat with ANC of 1.0

## 2016-08-29 ENCOUNTER — Ambulatory Visit (HOSPITAL_BASED_OUTPATIENT_CLINIC_OR_DEPARTMENT_OTHER): Payer: Medicare HMO | Admitting: Hematology and Oncology

## 2016-08-29 ENCOUNTER — Telehealth: Payer: Self-pay | Admitting: Hematology and Oncology

## 2016-08-29 ENCOUNTER — Encounter (HOSPITAL_COMMUNITY): Payer: Self-pay

## 2016-08-29 ENCOUNTER — Ambulatory Visit (HOSPITAL_COMMUNITY)
Admission: RE | Admit: 2016-08-29 | Discharge: 2016-08-29 | Disposition: A | Discharge status: Home / Self Care | Payer: Medicare HMO | Source: Ambulatory Visit | Attending: Hematology and Oncology | Admitting: Hematology and Oncology

## 2016-08-29 DIAGNOSIS — C539 Malignant neoplasm of cervix uteri, unspecified: Secondary | ICD-10-CM | POA: Diagnosis not present

## 2016-08-29 DIAGNOSIS — N2889 Other specified disorders of kidney and ureter: Secondary | ICD-10-CM | POA: Diagnosis not present

## 2016-08-29 DIAGNOSIS — C787 Secondary malignant neoplasm of liver and intrahepatic bile duct: Secondary | ICD-10-CM | POA: Insufficient documentation

## 2016-08-29 DIAGNOSIS — N83202 Unspecified ovarian cyst, left side: Secondary | ICD-10-CM | POA: Diagnosis not present

## 2016-08-29 DIAGNOSIS — D61818 Other pancytopenia: Secondary | ICD-10-CM

## 2016-08-29 DIAGNOSIS — M5126 Other intervertebral disc displacement, lumbar region: Secondary | ICD-10-CM | POA: Diagnosis not present

## 2016-08-29 DIAGNOSIS — C7802 Secondary malignant neoplasm of left lung: Secondary | ICD-10-CM

## 2016-08-29 DIAGNOSIS — R911 Solitary pulmonary nodule: Secondary | ICD-10-CM | POA: Diagnosis not present

## 2016-08-29 DIAGNOSIS — C229 Malignant neoplasm of liver, not specified as primary or secondary: Secondary | ICD-10-CM | POA: Diagnosis not present

## 2016-08-29 MED ORDER — IOPAMIDOL (ISOVUE-300) INJECTION 61%
100.0000 mL | Freq: Once | INTRAVENOUS | Status: AC | PRN
  Administered 2016-08-29: 100 mL via INTRAVENOUS

## 2016-08-29 MED ORDER — IOPAMIDOL (ISOVUE-300) INJECTION 61%
INTRAVENOUS | Status: AC
  Filled 2016-08-29: qty 100

## 2016-08-29 NOTE — Telephone Encounter (Signed)
Scheduled appt per 6/27 los - Gave patient AVS and calender per los 

## 2016-08-30 ENCOUNTER — Encounter: Payer: Self-pay | Admitting: Hematology and Oncology

## 2016-08-30 NOTE — Assessment & Plan Note (Signed)
She is symptomatic from pancytopenia with excessive fatigue As above, we will plan to reduce the dose of chemotherapy and space out her future treatment

## 2016-08-30 NOTE — Progress Notes (Signed)
[facility] OFFICE PROGRESS NOTE  Patient Care Team: [name], MD as PCP - General (Family Medicine) Gordy Levan, MD as Consulting Physician (Oncology) Carmel Sacramento, OD as Consulting Physician (Optometry)  SUMMARY OF ONCOLOGIC HISTORY:   Cervical cancer ([place])   04/26/2015 Imaging    Numerous bilateral pulmonary nodules consistent with metastatic disease are associated with liver lesions, omental nodules, mesenteric nodules, necrotic retroperitoneal lymphadenopathy in a mixed cystic and solid mass in the central pelvis involving the uterus extending into the cul-de-sac and both adnexal regions. Uterine primary (likely endometrial) is favored over an ovarian primary malignancy given the lack of ascites, associated intrahepatic metastases and pulmonary involvement.      05/04/2015 Procedure    Technically successful CT-guided core biopsy of left pelvic mass      05/04/2015 Pathology Results    Soft Tissue Needle Core Biopsy, left ext iliac mass METASTATIC HIGH GRADE POORLY DIFFERENTIATED CARCINOMA CONSISTENT WITH CERVICAL ORIGIN Microscopic Comment The neoplasm stains positive for cervical markers : p16, ER, ck8/18, and p63 (focal), TTF-1 (focal) and negative for synaptophysin, Chromogranin (neuroendocrine markers) and ck5/6. The morphology and immunohistochemistry staining pattern support these cells are GYN cervical origin.Spec      05/10/2015 Imaging    Innumerable (> than 40) pulmonary nodules randomly distributed throughout both lungs, largest 1.1 cm, in keeping with pulmonary metastases. 2. Left supraclavicular and left hilar nodal metastases. 3. Re- demonstration of liver metastases. 4. Right thyroid lobe 1.4 cm pulmonary nodule, for which no further evaluation is recommended. 5. Stable mild superior T11 vertebral compression deformity.      05/12/2015 - 05/12/2015 Chemotherapy    She received 1 dose of carbo/taxol only      05/15/2015 Imaging    Widely  metastatic cervical carcinoma with unchanged appearance compared to 04/26/2015. Bulky tumor narrows and likely invades the rectum/distal sigmoid with progressive proximal stool retention      05/15/2015 - 05/23/2015 Hospital Admission    She was admitted for severe bowel obstruction requiring urgent diversion colostomy      05/18/2015 Surgery    She underwent LAPAROSCOPIC DIVERTING SIGMOID LOOP COLOSTOMY for bowel obstruction       06/10/2015 Procedure    Successful placement of a right internal jugular approach power injectable Port-A-Cath. The catheter is ready for immediate use      06/23/2015 - 03/01/2016 Chemotherapy    She received combination treatment with cisplatin, Taxol and Avastin. Avastin was started on 5/11. Dose of Taxol was modified due to neuropathy and cisplatin was omited last dose due to 'weakness'      09/13/2015 Imaging    Significant interval treatment response. Pulmonary and liver metastases have significantly decreased in size. Thoracic adenopathy has decreased in size. Mixed attenuation mass encompassing the uterus and bilateral adnexal regions has significantly decreased in size. No residual visualized peritoneal tumor implants. No new or progressive metastatic disease. 2. No evidence of bowel obstruction or acute bowel inflammation status post diverting sigmoid colostomy. 3. Additional findings include mild aortic atherosclerosis and Stable mild chronic superior T12 compression fracture.      12/14/2015 Imaging    Stable to improved interval exam. Multiple bilateral pulmonary nodules are stable in size to decreased and the liver lesions are stable to decreased. Mixed attenuation lesion encompassing the uterus and adnexal region shows decrease in size. There is no evidence for new or progressive disease on today's exam.      05/15/2016 Imaging    Findings today reflecting mixed interval  response to therapy. 2. There has been mild increase in size of pulmonary nodules.  3. Interval increase in size of pelvic mass 4. Further regression of liver metastases 5. There is a new soft tissue nodule within the right lower quadrant peritoneal cavity. Suspicious peritoneal disease      06/07/2016 -  Chemotherapy    She received chemo with cisplatin and gemzar      06/14/2016 Adverse Reaction    Cycle 1, day 8 is put on hold due to severe pancytopenia      08/29/2016 Imaging    Ct scan of chest, abdomen and pelvis: 1. General improvement, with reduced size of the scattered pulmonary nodules, and also a much more normal contour and appearance of the uterus, without the visible enhancing irregular mass shown on the 05/15/2016 exam involving the uterus and cervix. There still some mild soft tissue prominence of the cervix, and a cystic lesion of the left ovary as noted above. No pathologic adenopathy. 2. The right lower quadrant omental nodule has considerably improved and nearly resolved. 3. The prior hepatic metastatic lesions have essentially resolved, with only minimal residual linear hypodensity in segment 4 at the site of the prior large metastatic lesion shown on 05/15/2015.  4. Other imaging findings of potential clinical significance: Left lower quadrant colostomy. Chronic superior endplate compression at T11. Bony demineralization. Trace free pelvic fluid in the cul-de-sac. Central disc protrusion at L3-4. Left mid kidney hypodense lesion is likely a cyst.       INTERVAL HISTORY: Please see below for problem oriented charting. She returns for further follow-up and review of test results She feels well Denies recent chest pain, cough or shortness of breath No vaginal bleeding Denies peripheral neuropathy She complain of excessive fatigue The patient denies any recent signs or symptoms of bleeding such as spontaneous epistaxis, hematuria or hematochezia.   REVIEW OF SYSTEMS:   Constitutional: Denies fevers, chills or abnormal weight loss Eyes: Denies  blurriness of vision Ears, nose, mouth, throat, and face: Denies mucositis or sore throat Respiratory: Denies cough, dyspnea or wheezes Cardiovascular: Denies palpitation, chest discomfort or lower extremity swelling Gastrointestinal:  Denies nausea, heartburn or change in bowel habits Skin: Denies abnormal skin rashes Lymphatics: Denies new lymphadenopathy or easy bruising Neurological:Denies numbness, tingling or new weaknesses Behavioral/Psych: Mood is stable, no new changes  All other systems were reviewed with the patient and are negative.  I have reviewed the past medical history, past surgical history, social history and family history with the patient and they are unchanged from previous note.  ALLERGIES:  has No Known Allergies.  MEDICATIONS:  Current Outpatient Prescriptions  Medication Sig Dispense Refill  . cholecalciferol (VITAMIN D) 1000 units tablet Take 1,000 Units by mouth daily.    Marland Kitchen omeprazole (PRILOSEC) 20 MG capsule Take 1 capsule (20 mg total) by mouth daily. 90 capsule 0  . temazepam (RESTORIL) 15 MG capsule Take 15 mg by mouth at bedtime as needed for sleep.     No current facility-administered medications for this visit.    Facility-Administered Medications Ordered in Other Visits  Medication Dose Route Frequency Provider Last Rate Last Dose  . heparin lock flush 100 unit/mL  500 Units Intravenous Once Livesay, Lennis P, MD      . heparin lock flush 100 unit/mL  500 Units Intracatheter Once PRN Alvy Bimler, Rafiel Mecca, MD      . sodium chloride flush (NS) 0.9 % injection 10 mL  10 mL Intravenous PRN Livesay, Tamala Julian, MD      .  sodium chloride flush (NS) 0.9 % injection 10 mL  10 mL Intracatheter PRN Alvy Bimler, Viney Acocella, MD        PHYSICAL EXAMINATION: ECOG PERFORMANCE STATUS: 1 - Symptomatic but completely ambulatory  Vitals:   08/29/16 1231  BP: 138/64  Pulse: 87  Resp: 18  Temp: 98.2 F (36.8 C)   Filed Weights   08/29/16 1231  Weight: 171 lb 1.6 oz (77.6 kg)     GENERAL:alert, no distress and comfortable SKIN: skin color, texture, turgor are normal, no rashes or significant lesions EYES: normal, Conjunctiva are pink and non-injected, sclera clear OROPHARYNX:no exudate, no erythema and lips, buccal mucosa, and tongue normal  NECK: supple, thyroid normal size, non-tender, without nodularity LYMPH:  no palpable lymphadenopathy in the cervical, axillary or inguinal LUNGS: clear to auscultation and percussion with normal breathing effort HEART: regular rate & rhythm and no murmurs and no lower extremity edema ABDOMEN:abdomen soft, non-tender and normal bowel sounds Musculoskeletal:no cyanosis of digits and no clubbing  NEURO: alert & oriented x 3 with fluent speech, no focal motor/sensory deficits  LABORATORY DATA:  I have reviewed the data as listed    Component Value Date/Time   NA 139 08/23/2016 0756   K 4.1 08/23/2016 0756   CL 109 05/22/2015 0522   CO2 23 08/23/2016 0756   GLUCOSE 104 08/23/2016 0756   BUN 16.7 08/23/2016 0756   CREATININE 1.1 08/23/2016 0756   CALCIUM 9.3 08/23/2016 0756   PROT 6.6 08/23/2016 0756   ALBUMIN 3.5 08/23/2016 0756   AST 16 08/23/2016 0756   ALT 13 08/23/2016 0756   ALKPHOS 103 08/23/2016 0756   BILITOT 0.41 08/23/2016 0756   GFRNONAA >60 05/22/2015 0522   GFRAA >60 05/22/2015 0522    No results found for: SPEP, UPEP  Lab Results  Component Value Date   WBC 2.9 (L) 08/23/2016   NEUTROABS 1.0 (L) 08/23/2016   HGB 9.8 (L) 08/23/2016   HCT 28.9 (L) 08/23/2016   MCV 92.9 08/23/2016   PLT 207 08/23/2016      Chemistry      Component Value Date/Time   NA 139 08/23/2016 0756   K 4.1 08/23/2016 0756   CL 109 05/22/2015 0522   CO2 23 08/23/2016 0756   BUN 16.7 08/23/2016 0756   CREATININE 1.1 08/23/2016 0756      Component Value Date/Time   CALCIUM 9.3 08/23/2016 0756   ALKPHOS 103 08/23/2016 0756   AST 16 08/23/2016 0756   ALT 13 08/23/2016 0756   BILITOT 0.41 08/23/2016 0756        RADIOGRAPHIC STUDIES: I have personally reviewed the radiological images as listed and agreed with the findings in the report. Ct Chest W Contrast  Result Date: 08/29/2016 CLINICAL DATA:  Cervical cancer, ongoing chemotherapy, metastatic disease to the lung and liver. EXAM: CT CHEST, ABDOMEN, AND PELVIS WITH CONTRAST TECHNIQUE: Multidetector CT imaging of the chest, abdomen and pelvis was performed following the standard protocol during bolus administration of intravenous contrast. CONTRAST:  11mL ISOVUE-300 IOPAMIDOL (ISOVUE-300) INJECTION 61% COMPARISON:  Multiple exams, including 05/15/2016 FINDINGS: CT CHEST FINDINGS Cardiovascular: Right Port-A-Cath tip:  Lower SVC. Mediastinum/Nodes: Upper mediastinal node 0.6 cm in short axis on image 7/4, formerly 0.7 cm. No overtly pathologic thoracic adenopathy. Lungs/Pleura: Biapical pleuroparenchymal scarring. Reduced size of the metastatic pulmonary nodules. For example, a left lower lobe nodule measuring 0.4 by 0.3 cm on image 107/10 previously measured 0.8 by 0.6 cm. Musculoskeletal: Thoracic spondylosis. Bony demineralization. Mild chronic superior endplate compression  at T11. CT ABDOMEN PELVIS FINDINGS Hepatobiliary: Mildly contracted gallbladder. Subtle linear hypodensity along segment 4 of the liver for example on image 52/4, representing residua of effectively treated metastatic lesion. The other prior metastatic lesions in the liver shown on 05/15/2015 are not readily apparent today. Pancreas: Unremarkable Spleen: Unremarkable Adrenals/Urinary Tract: Stable 1.2 cm in long axis hypodense lesion of the left mid kidney on image 70/4, technically nonspecific due to small size but statistically likely to be a cyst and unchanged from prior. Adrenal glands normal. Stomach/Bowel: Left lower quadrant colostomy. Vascular/Lymphatic: Unremarkable Reproductive: On the prior exam there was an irregular heterogeneous rim enhancing mass involving the uterus and  likely the cervix, and possibly extending out towards the adnexa. On today' s exam, the uterus demonstrates a much more normal shape an without the heterogeneously enhancing internal mass shown previously. Presumably this is due to interval effective therapy. The continues to be some slight prominence in the cervical region for example on image 112/4. 2.8 cm cystic lesion of the left adnexa noted, probably arising from the left ovary, and similar to the prior exam. The uterus currently measures 7.8 cm in length by 3.6 cm anterior-posterior by 5.4 cm transverse. Other: Small amount of free pelvic fluid in the cul-de-sac. The rectum is not readily separable from the posterior uterine margin and there is likely some fluid in this vicinity. Previous right lower quadrant omental nodule has prominently improved, previously measuring 1.3 by 0.9 cm and currently only faintly visible and measuring about 0.5 by 0.4 cm on image 89/4. Musculoskeletal: Bony demineralization. Central disc protrusion at L3-4. IMPRESSION: 1. General improvement, with reduced size of the scattered pulmonary nodules, and also a much more normal contour and appearance of the uterus, without the visible enhancing irregular mass shown on the 05/15/2016 exam involving the uterus and cervix. There still some mild soft tissue prominence of the cervix, and a cystic lesion of the left ovary as noted above. No pathologic adenopathy. 2. The right lower quadrant omental nodule has considerably improved and nearly resolved. 3. The prior hepatic metastatic lesions have essentially resolved, with only minimal residual linear hypodensity in segment 4 at the site of the prior large metastatic lesion shown on 05/15/2015. 4. Other imaging findings of potential clinical significance: Left lower quadrant colostomy. Chronic superior endplate compression at T11. Bony demineralization. Trace free pelvic fluid in the cul-de-sac. Central disc protrusion at L3-4. Left mid  kidney hypodense lesion is likely a cyst. Electronically Signed   By: Van Clines M.D.   On: 08/29/2016 12:54   Ct Abdomen Pelvis W Contrast  Result Date: 08/29/2016 CLINICAL DATA:  Cervical cancer, ongoing chemotherapy, metastatic disease to the lung and liver. EXAM: CT CHEST, ABDOMEN, AND PELVIS WITH CONTRAST TECHNIQUE: Multidetector CT imaging of the chest, abdomen and pelvis was performed following the standard protocol during bolus administration of intravenous contrast. CONTRAST:  119mL ISOVUE-300 IOPAMIDOL (ISOVUE-300) INJECTION 61% COMPARISON:  Multiple exams, including 05/15/2016 FINDINGS: CT CHEST FINDINGS Cardiovascular: Right Port-A-Cath tip:  Lower SVC. Mediastinum/Nodes: Upper mediastinal node 0.6 cm in short axis on image 7/4, formerly 0.7 cm. No overtly pathologic thoracic adenopathy. Lungs/Pleura: Biapical pleuroparenchymal scarring. Reduced size of the metastatic pulmonary nodules. For example, a left lower lobe nodule measuring 0.4 by 0.3 cm on image 107/10 previously measured 0.8 by 0.6 cm. Musculoskeletal: Thoracic spondylosis. Bony demineralization. Mild chronic superior endplate compression at T11. CT ABDOMEN PELVIS FINDINGS Hepatobiliary: Mildly contracted gallbladder. Subtle linear hypodensity along segment 4 of the liver for example  on image 52/4, representing residua of effectively treated metastatic lesion. The other prior metastatic lesions in the liver shown on 05/15/2015 are not readily apparent today. Pancreas: Unremarkable Spleen: Unremarkable Adrenals/Urinary Tract: Stable 1.2 cm in long axis hypodense lesion of the left mid kidney on image 70/4, technically nonspecific due to small size but statistically likely to be a cyst and unchanged from prior. Adrenal glands normal. Stomach/Bowel: Left lower quadrant colostomy. Vascular/Lymphatic: Unremarkable Reproductive: On the prior exam there was an irregular heterogeneous rim enhancing mass involving the uterus and likely the  cervix, and possibly extending out towards the adnexa. On today' s exam, the uterus demonstrates a much more normal shape an without the heterogeneously enhancing internal mass shown previously. Presumably this is due to interval effective therapy. The continues to be some slight prominence in the cervical region for example on image 112/4. 2.8 cm cystic lesion of the left adnexa noted, probably arising from the left ovary, and similar to the prior exam. The uterus currently measures 7.8 cm in length by 3.6 cm anterior-posterior by 5.4 cm transverse. Other: Small amount of free pelvic fluid in the cul-de-sac. The rectum is not readily separable from the posterior uterine margin and there is likely some fluid in this vicinity. Previous right lower quadrant omental nodule has prominently improved, previously measuring 1.3 by 0.9 cm and currently only faintly visible and measuring about 0.5 by 0.4 cm on image 89/4. Musculoskeletal: Bony demineralization. Central disc protrusion at L3-4. IMPRESSION: 1. General improvement, with reduced size of the scattered pulmonary nodules, and also a much more normal contour and appearance of the uterus, without the visible enhancing irregular mass shown on the 05/15/2016 exam involving the uterus and cervix. There still some mild soft tissue prominence of the cervix, and a cystic lesion of the left ovary as noted above. No pathologic adenopathy. 2. The right lower quadrant omental nodule has considerably improved and nearly resolved. 3. The prior hepatic metastatic lesions have essentially resolved, with only minimal residual linear hypodensity in segment 4 at the site of the prior large metastatic lesion shown on 05/15/2015. 4. Other imaging findings of potential clinical significance: Left lower quadrant colostomy. Chronic superior endplate compression at T11. Bony demineralization. Trace free pelvic fluid in the cul-de-sac. Central disc protrusion at L3-4. Left mid kidney  hypodense lesion is likely a cyst. Electronically Signed   By: Van Clines M.D.   On: 08/29/2016 12:54    ASSESSMENT & PLAN:  Cervical cancer ([place]) I have a long discussion with the patient She has excellent response to treatment However, she tolerated treatment poorly due to significant pancytopenia Per patient request, I will space out her future appointments She has a lot of location time planned around the summer and I will try to accommodate her needs We will continue reduced dose chemotherapy I will also request pathologist to add PD1 testing to her tissue sample in case we need to switch treatment in the future I plan to continue treatment at least for another 3 months and repeat imaging study again in September 2018.  Malignant neoplasm metastatic to left lung [facility]) She is not symptomatic from lung metastasis  Pancytopenia, acquired ([place]) She is symptomatic from pancytopenia with excessive fatigue As above, we will plan to reduce the dose of chemotherapy and space out her future treatment   No orders of the defined types were placed in this encounter.  All questions were answered. The patient knows to call the clinic with any problems, questions or  concerns. No barriers to learning was detected. I spent 25 minutes counseling the patient face to face. The total time spent in the appointment was 30 minutes and more than 50% was on counseling and review of test results     Heath Lark, MD 08/30/2016 3:53 PM

## 2016-08-30 NOTE — Assessment & Plan Note (Signed)
She is not symptomatic from lung metastasis 

## 2016-08-30 NOTE — Assessment & Plan Note (Signed)
I have a long discussion with the patient She has excellent response to treatment However, she tolerated treatment poorly due to significant pancytopenia Per patient request, I will space out her future appointments She has a lot of location time planned around the summer and I will try to accommodate her needs We will continue reduced dose chemotherapy I will also request pathologist to add PD1 testing to her tissue sample in case we need to switch treatment in the future I plan to continue treatment at least for another 3 months and repeat imaging study again in September 2018.

## 2016-08-31 ENCOUNTER — Telehealth: Payer: Self-pay | Admitting: *Deleted

## 2016-08-31 ENCOUNTER — Other Ambulatory Visit (HOSPITAL_COMMUNITY)
Admission: RE | Admit: 2016-08-31 | Discharge: 2016-08-31 | Disposition: A | Discharge status: Home / Self Care | Payer: Medicare HMO | Source: Ambulatory Visit | Attending: Hematology and Oncology | Admitting: Hematology and Oncology

## 2016-08-31 DIAGNOSIS — C539 Malignant neoplasm of cervix uteri, unspecified: Secondary | ICD-10-CM | POA: Diagnosis not present

## 2016-08-31 DIAGNOSIS — C799 Secondary malignant neoplasm of unspecified site: Secondary | ICD-10-CM | POA: Insufficient documentation

## 2016-08-31 NOTE — Telephone Encounter (Signed)
-----   Message from Heath Lark, MD sent at 08/30/2016  3:51 PM EDT ----- Regarding: PD-1 testing on tissue sample Accession: [id number]  Pls call Dr. Casimer Lanius to add this test

## 2016-08-31 NOTE — Telephone Encounter (Signed)
Testing below requested

## 2016-09-14 ENCOUNTER — Ambulatory Visit (HOSPITAL_BASED_OUTPATIENT_CLINIC_OR_DEPARTMENT_OTHER): Payer: Medicare HMO

## 2016-09-14 ENCOUNTER — Other Ambulatory Visit: Payer: Self-pay | Admitting: Hematology and Oncology

## 2016-09-14 ENCOUNTER — Ambulatory Visit: Payer: Medicare HMO

## 2016-09-14 ENCOUNTER — Other Ambulatory Visit (HOSPITAL_BASED_OUTPATIENT_CLINIC_OR_DEPARTMENT_OTHER): Payer: Medicare HMO

## 2016-09-14 VITALS — BP 131/69 | HR 67 | Temp 98.2°F | Resp 17

## 2016-09-14 DIAGNOSIS — C799 Secondary malignant neoplasm of unspecified site: Secondary | ICD-10-CM

## 2016-09-14 DIAGNOSIS — Z5111 Encounter for antineoplastic chemotherapy: Secondary | ICD-10-CM | POA: Diagnosis not present

## 2016-09-14 DIAGNOSIS — C7989 Secondary malignant neoplasm of other specified sites: Secondary | ICD-10-CM

## 2016-09-14 DIAGNOSIS — C787 Secondary malignant neoplasm of liver and intrahepatic bile duct: Secondary | ICD-10-CM

## 2016-09-14 DIAGNOSIS — C7801 Secondary malignant neoplasm of right lung: Secondary | ICD-10-CM

## 2016-09-14 DIAGNOSIS — C7802 Secondary malignant neoplasm of left lung: Secondary | ICD-10-CM

## 2016-09-14 DIAGNOSIS — C778 Secondary and unspecified malignant neoplasm of lymph nodes of multiple regions: Secondary | ICD-10-CM

## 2016-09-14 DIAGNOSIS — C539 Malignant neoplasm of cervix uteri, unspecified: Secondary | ICD-10-CM

## 2016-09-14 LAB — CBC WITH DIFFERENTIAL/PLATELET
BASO%: 0.9 % (ref 0.0–2.0)
BASOS ABS: 0 10*3/uL (ref 0.0–0.1)
EOS%: 2.7 % (ref 0.0–7.0)
Eosinophils Absolute: 0.1 10*3/uL (ref 0.0–0.5)
HCT: 28 % — ABNORMAL LOW (ref 34.8–46.6)
HEMOGLOBIN: 9.6 g/dL — AB (ref 11.6–15.9)
LYMPH%: 38.8 % (ref 14.0–49.7)
MCH: 32.5 pg (ref 25.1–34.0)
MCHC: 34.3 g/dL (ref 31.5–36.0)
MCV: 94.9 fL (ref 79.5–101.0)
MONO#: 0.6 10*3/uL (ref 0.1–0.9)
MONO%: 20 % — ABNORMAL HIGH (ref 0.0–14.0)
NEUT#: 1.1 10*3/uL — ABNORMAL LOW (ref 1.5–6.5)
NEUT%: 37.6 % — ABNORMAL LOW (ref 38.4–76.8)
Platelets: 221 10*3/uL (ref 145–400)
RBC: 2.95 10*6/uL — ABNORMAL LOW (ref 3.70–5.45)
RDW: 20 % — AB (ref 11.2–14.5)
WBC: 3 10*3/uL — ABNORMAL LOW (ref 3.9–10.3)
lymph#: 1.2 10*3/uL (ref 0.9–3.3)

## 2016-09-14 LAB — COMPREHENSIVE METABOLIC PANEL
ALBUMIN: 3.5 g/dL (ref 3.5–5.0)
ALT: 12 U/L (ref 0–55)
AST: 15 U/L (ref 5–34)
Alkaline Phosphatase: 109 U/L (ref 40–150)
Anion Gap: 8 mEq/L (ref 3–11)
BUN: 13.1 mg/dL (ref 7.0–26.0)
CHLORIDE: 107 meq/L (ref 98–109)
CO2: 24 mEq/L (ref 22–29)
Calcium: 9.1 mg/dL (ref 8.4–10.4)
Creatinine: 1.1 mg/dL (ref 0.6–1.1)
EGFR: 53 mL/min/{1.73_m2} — ABNORMAL LOW (ref >90)
GLUCOSE: 101 mg/dL (ref 70–140)
POTASSIUM: 4.2 meq/L (ref 3.5–5.1)
SODIUM: 139 meq/L (ref 136–145)
Total Bilirubin: 0.42 mg/dL (ref 0.20–1.20)
Total Protein: 6.6 g/dL (ref 6.4–8.3)

## 2016-09-14 LAB — MAGNESIUM: MAGNESIUM: 2 mg/dL (ref 1.5–2.5)

## 2016-09-14 MED ORDER — SODIUM CHLORIDE 0.9% FLUSH
10.0000 mL | INTRAVENOUS | Status: DC | PRN
  Administered 2016-09-14: 10 mL
  Filled 2016-09-14: qty 10

## 2016-09-14 MED ORDER — PALONOSETRON HCL INJECTION 0.25 MG/5ML
0.2500 mg | Freq: Once | INTRAVENOUS | Status: AC
  Administered 2016-09-14: 0.25 mg via INTRAVENOUS

## 2016-09-14 MED ORDER — SODIUM CHLORIDE 0.9% FLUSH
10.0000 mL | Freq: Once | INTRAVENOUS | Status: AC
  Administered 2016-09-14: 10 mL
  Filled 2016-09-14: qty 10

## 2016-09-14 MED ORDER — SODIUM CHLORIDE 0.9 % IV SOLN
Freq: Once | INTRAVENOUS | Status: AC
  Administered 2016-09-14: 11:00:00 via INTRAVENOUS

## 2016-09-14 MED ORDER — SODIUM CHLORIDE 0.9 % IV SOLN
26.5000 mg/m2 | Freq: Once | INTRAVENOUS | Status: AC
  Administered 2016-09-14: 50 mg via INTRAVENOUS
  Filled 2016-09-14: qty 50

## 2016-09-14 MED ORDER — FOSAPREPITANT DIMEGLUMINE INJECTION 150 MG
Freq: Once | INTRAVENOUS | Status: AC
  Administered 2016-09-14: 11:00:00 via INTRAVENOUS
  Filled 2016-09-14: qty 5

## 2016-09-14 MED ORDER — HEPARIN SOD (PORK) LOCK FLUSH 100 UNIT/ML IV SOLN
500.0000 [IU] | Freq: Once | INTRAVENOUS | Status: AC | PRN
  Administered 2016-09-14: 500 [IU]
  Filled 2016-09-14: qty 5

## 2016-09-14 MED ORDER — PALONOSETRON HCL INJECTION 0.25 MG/5ML
INTRAVENOUS | Status: AC
  Filled 2016-09-14: qty 5

## 2016-09-14 MED ORDER — POTASSIUM CHLORIDE 2 MEQ/ML IV SOLN
Freq: Once | INTRAVENOUS | Status: AC
  Administered 2016-09-14: 09:00:00 via INTRAVENOUS
  Filled 2016-09-14: qty 10

## 2016-09-14 MED ORDER — SODIUM CHLORIDE 0.9 % IV SOLN
500.0000 mg/m2 | Freq: Once | INTRAVENOUS | Status: AC
  Administered 2016-09-14: 950 mg via INTRAVENOUS
  Filled 2016-09-14: qty 24.98

## 2016-09-14 NOTE — Progress Notes (Signed)
OK to proceed with chemo with today's lab results- low ANC.

## 2016-09-14 NOTE — Patient Instructions (Signed)
[facility] Discharge Instructions for Patients Receiving Chemotherapy  Today you received the following chemotherapy agents:  Cisplatin, Gemzar  To help prevent nausea and vomiting after your treatment, we encourage you to take your nausea medication as prescribed.    If you develop nausea and vomiting that is not controlled by your nausea medication, call the clinic.   BELOW ARE SYMPTOMS THAT SHOULD BE REPORTED IMMEDIATELY:  *FEVER GREATER THAN 100.5 F  *CHILLS WITH OR WITHOUT FEVER  NAUSEA AND VOMITING THAT IS NOT CONTROLLED WITH YOUR NAUSEA MEDICATION  *UNUSUAL SHORTNESS OF BREATH  *UNUSUAL BRUISING OR BLEEDING  TENDERNESS IN MOUTH AND THROAT WITH OR WITHOUT PRESENCE OF ULCERS  *URINARY PROBLEMS  *BOWEL PROBLEMS  UNUSUAL RASH Items with * indicate a potential emergency and should be followed up as soon as possible.  Feel free to call the clinic you have any questions or concerns. The clinic phone number is [phone].  Please show the CHEMO ALERT CARD at check-in to the Emergency Department and triage nurse.   

## 2016-09-14 NOTE — Patient Instructions (Signed)

## 2016-09-19 ENCOUNTER — Other Ambulatory Visit: Payer: Self-pay | Admitting: Hematology and Oncology

## 2016-09-19 ENCOUNTER — Telehealth: Payer: Self-pay | Admitting: Hematology and Oncology

## 2016-09-19 NOTE — Telephone Encounter (Signed)
I spoke with [facility] representative and explained the rationale behind modify dosing.  He will adjust the prior authorization and will approve for 6 more months of treatment from 10/04/2016.

## 2016-09-27 ENCOUNTER — Encounter (HOSPITAL_COMMUNITY): Payer: Self-pay

## 2016-09-29 ENCOUNTER — Other Ambulatory Visit: Payer: Self-pay | Admitting: Family Medicine

## 2016-10-05 ENCOUNTER — Encounter: Payer: Self-pay | Admitting: Hematology and Oncology

## 2016-10-05 ENCOUNTER — Telehealth: Payer: Self-pay | Admitting: Hematology and Oncology

## 2016-10-05 ENCOUNTER — Ambulatory Visit: Payer: Medicare HMO

## 2016-10-05 ENCOUNTER — Ambulatory Visit (HOSPITAL_BASED_OUTPATIENT_CLINIC_OR_DEPARTMENT_OTHER): Payer: Medicare HMO | Admitting: Hematology and Oncology

## 2016-10-05 ENCOUNTER — Ambulatory Visit (HOSPITAL_BASED_OUTPATIENT_CLINIC_OR_DEPARTMENT_OTHER): Payer: Medicare HMO

## 2016-10-05 ENCOUNTER — Other Ambulatory Visit (HOSPITAL_BASED_OUTPATIENT_CLINIC_OR_DEPARTMENT_OTHER): Payer: Medicare HMO

## 2016-10-05 DIAGNOSIS — C7801 Secondary malignant neoplasm of right lung: Secondary | ICD-10-CM

## 2016-10-05 DIAGNOSIS — Z5111 Encounter for antineoplastic chemotherapy: Secondary | ICD-10-CM

## 2016-10-05 DIAGNOSIS — C539 Malignant neoplasm of cervix uteri, unspecified: Secondary | ICD-10-CM

## 2016-10-05 DIAGNOSIS — G620 Drug-induced polyneuropathy: Secondary | ICD-10-CM

## 2016-10-05 DIAGNOSIS — Z933 Colostomy status: Secondary | ICD-10-CM

## 2016-10-05 DIAGNOSIS — C7802 Secondary malignant neoplasm of left lung: Secondary | ICD-10-CM

## 2016-10-05 DIAGNOSIS — C799 Secondary malignant neoplasm of unspecified site: Secondary | ICD-10-CM

## 2016-10-05 DIAGNOSIS — C7989 Secondary malignant neoplasm of other specified sites: Secondary | ICD-10-CM

## 2016-10-05 DIAGNOSIS — T451X5A Adverse effect of antineoplastic and immunosuppressive drugs, initial encounter: Secondary | ICD-10-CM

## 2016-10-05 DIAGNOSIS — D61818 Other pancytopenia: Secondary | ICD-10-CM

## 2016-10-05 DIAGNOSIS — C787 Secondary malignant neoplasm of liver and intrahepatic bile duct: Secondary | ICD-10-CM

## 2016-10-05 LAB — CBC WITH DIFFERENTIAL/PLATELET
BASO%: 0.8 % (ref 0.0–2.0)
BASOS ABS: 0 10*3/uL (ref 0.0–0.1)
EOS ABS: 0.1 10*3/uL (ref 0.0–0.5)
EOS%: 3.3 % (ref 0.0–7.0)
HCT: 29 % — ABNORMAL LOW (ref 34.8–46.6)
HGB: 9.8 g/dL — ABNORMAL LOW (ref 11.6–15.9)
LYMPH%: 41.8 % (ref 14.0–49.7)
MCH: 32.6 pg (ref 25.1–34.0)
MCHC: 33.9 g/dL (ref 31.5–36.0)
MCV: 96.3 fL (ref 79.5–101.0)
MONO#: 0.6 10*3/uL (ref 0.1–0.9)
MONO%: 20.3 % — AB (ref 0.0–14.0)
NEUT%: 33.8 % — ABNORMAL LOW (ref 38.4–76.8)
NEUTROS ABS: 1 10*3/uL — AB (ref 1.5–6.5)
PLATELETS: 198 10*3/uL (ref 145–400)
RBC: 3.02 10*6/uL — AB (ref 3.70–5.45)
RDW: 17.6 % — ABNORMAL HIGH (ref 11.2–14.5)
WBC: 3 10*3/uL — AB (ref 3.9–10.3)
lymph#: 1.2 10*3/uL (ref 0.9–3.3)

## 2016-10-05 LAB — COMPREHENSIVE METABOLIC PANEL
ALBUMIN: 3.5 g/dL (ref 3.5–5.0)
ALT: 9 U/L (ref 0–55)
AST: 15 U/L (ref 5–34)
Alkaline Phosphatase: 112 U/L (ref 40–150)
Anion Gap: 8 mEq/L (ref 3–11)
BILIRUBIN TOTAL: 0.34 mg/dL (ref 0.20–1.20)
BUN: 15.5 mg/dL (ref 7.0–26.0)
CO2: 24 meq/L (ref 22–29)
CREATININE: 1.2 mg/dL — AB (ref 0.6–1.1)
Calcium: 8.9 mg/dL (ref 8.4–10.4)
Chloride: 109 mEq/L (ref 98–109)
EGFR: 46 mL/min/{1.73_m2} — AB (ref >90)
GLUCOSE: 103 mg/dL (ref 70–140)
Potassium: 4.1 mEq/L (ref 3.5–5.1)
SODIUM: 141 meq/L (ref 136–145)
TOTAL PROTEIN: 6.4 g/dL (ref 6.4–8.3)

## 2016-10-05 LAB — MAGNESIUM: Magnesium: 1.8 mg/dl (ref 1.5–2.5)

## 2016-10-05 MED ORDER — SODIUM CHLORIDE 0.9 % IV SOLN
500.0000 mg/m2 | Freq: Once | INTRAVENOUS | Status: AC
  Administered 2016-10-05: 950 mg via INTRAVENOUS
  Filled 2016-10-05: qty 24.98

## 2016-10-05 MED ORDER — PALONOSETRON HCL INJECTION 0.25 MG/5ML
0.2500 mg | Freq: Once | INTRAVENOUS | Status: AC
  Administered 2016-10-05: 0.25 mg via INTRAVENOUS

## 2016-10-05 MED ORDER — SODIUM CHLORIDE 0.9% FLUSH
10.0000 mL | Freq: Once | INTRAVENOUS | Status: AC
  Administered 2016-10-05: 10 mL
  Filled 2016-10-05: qty 10

## 2016-10-05 MED ORDER — HEPARIN SOD (PORK) LOCK FLUSH 100 UNIT/ML IV SOLN
500.0000 [IU] | Freq: Once | INTRAVENOUS | Status: AC | PRN
  Administered 2016-10-05: 500 [IU]
  Filled 2016-10-05: qty 5

## 2016-10-05 MED ORDER — PALONOSETRON HCL INJECTION 0.25 MG/5ML
INTRAVENOUS | Status: AC
  Filled 2016-10-05: qty 5

## 2016-10-05 MED ORDER — SODIUM CHLORIDE 0.9 % IV SOLN
Freq: Once | INTRAVENOUS | Status: AC
  Administered 2016-10-05: 12:00:00 via INTRAVENOUS
  Filled 2016-10-05: qty 5

## 2016-10-05 MED ORDER — SODIUM CHLORIDE 0.9 % IV SOLN
26.5000 mg/m2 | Freq: Once | INTRAVENOUS | Status: AC
  Administered 2016-10-05: 50 mg via INTRAVENOUS
  Filled 2016-10-05: qty 50

## 2016-10-05 MED ORDER — SODIUM CHLORIDE 0.9% FLUSH
10.0000 mL | INTRAVENOUS | Status: DC | PRN
Start: 2016-10-05 — End: 2016-10-05
  Administered 2016-10-05: 10 mL
  Filled 2016-10-05: qty 10

## 2016-10-05 MED ORDER — SODIUM CHLORIDE 0.9 % IV SOLN
Freq: Once | INTRAVENOUS | Status: AC
  Administered 2016-10-05: 09:00:00 via INTRAVENOUS

## 2016-10-05 MED ORDER — POTASSIUM CHLORIDE 2 MEQ/ML IV SOLN
Freq: Once | INTRAVENOUS | Status: AC
  Administered 2016-10-05: 09:00:00 via INTRAVENOUS
  Filled 2016-10-05: qty 10

## 2016-10-05 NOTE — Assessment & Plan Note (Signed)
She has trace peripheral neuropathy from prior treatment. I plan to reduce the dose of cisplatin as above 

## 2016-10-05 NOTE — Patient Instructions (Signed)
[facility] Discharge Instructions for Patients Receiving Chemotherapy  Today you received the following chemotherapy agents cisplatin/gemzar   To help prevent nausea and vomiting after your treatment, we encourage you to take your nausea medication as directed  If you develop nausea and vomiting that is not controlled by your nausea medication, call the clinic.   BELOW ARE SYMPTOMS THAT SHOULD BE REPORTED IMMEDIATELY:  *FEVER GREATER THAN 100.5 F  *CHILLS WITH OR WITHOUT FEVER  NAUSEA AND VOMITING THAT IS NOT CONTROLLED WITH YOUR NAUSEA MEDICATION  *UNUSUAL SHORTNESS OF BREATH  *UNUSUAL BRUISING OR BLEEDING  TENDERNESS IN MOUTH AND THROAT WITH OR WITHOUT PRESENCE OF ULCERS  *URINARY PROBLEMS  *BOWEL PROBLEMS  UNUSUAL RASH Items with * indicate a potential emergency and should be followed up as soon as possible.  Feel free to call the clinic you have any questions or concerns. The clinic phone number is [phone].  

## 2016-10-05 NOTE — Telephone Encounter (Signed)
Scheduled appt per 8/3 los - patient called back to treatment area before appts were scheduled - patient to get new sch in the treatment area.

## 2016-10-05 NOTE — Progress Notes (Signed)
[facility] OFFICE PROGRESS NOTE  Patient Care Team: [name], MD as PCP - General (Family Medicine) Gordy Levan, MD as Consulting Physician (Oncology) Carmel Sacramento, OD as Consulting Physician (Optometry)  SUMMARY OF ONCOLOGIC HISTORY:   Cervical cancer ([place])   04/26/2015 Imaging    Numerous bilateral pulmonary nodules consistent with metastatic disease are associated with liver lesions, omental nodules, mesenteric nodules, necrotic retroperitoneal lymphadenopathy in a mixed cystic and solid mass in the central pelvis involving the uterus extending into the cul-de-sac and both adnexal regions. Uterine primary (likely endometrial) is favored over an ovarian primary malignancy given the lack of ascites, associated intrahepatic metastases and pulmonary involvement.      05/04/2015 Procedure    Technically successful CT-guided core biopsy of left pelvic mass      05/04/2015 Pathology Results    Soft Tissue Needle Core Biopsy, left ext iliac mass METASTATIC HIGH GRADE POORLY DIFFERENTIATED CARCINOMA CONSISTENT WITH CERVICAL ORIGIN Microscopic Comment The neoplasm stains positive for cervical markers : p16, ER, ck8/18, and p63 (focal), TTF-1 (focal) and negative for synaptophysin, Chromogranin (neuroendocrine markers) and ck5/6. The morphology and immunohistochemistry staining pattern support these cells are GYN cervical origin.Spec      05/10/2015 Imaging    Innumerable (> than 40) pulmonary nodules randomly distributed throughout both lungs, largest 1.1 cm, in keeping with pulmonary metastases. 2. Left supraclavicular and left hilar nodal metastases. 3. Re- demonstration of liver metastases. 4. Right thyroid lobe 1.4 cm pulmonary nodule, for which no further evaluation is recommended. 5. Stable mild superior T11 vertebral compression deformity.      05/12/2015 - 05/12/2015 Chemotherapy    She received 1 dose of carbo/taxol only      05/15/2015 Imaging    Widely  metastatic cervical carcinoma with unchanged appearance compared to 04/26/2015. Bulky tumor narrows and likely invades the rectum/distal sigmoid with progressive proximal stool retention      05/15/2015 - 05/23/2015 Hospital Admission    She was admitted for severe bowel obstruction requiring urgent diversion colostomy      05/18/2015 Surgery    She underwent LAPAROSCOPIC DIVERTING SIGMOID LOOP COLOSTOMY for bowel obstruction       06/10/2015 Procedure    Successful placement of a right internal jugular approach power injectable Port-A-Cath. The catheter is ready for immediate use      06/23/2015 - 03/01/2016 Chemotherapy    She received combination treatment with cisplatin, Taxol and Avastin. Avastin was started on 5/11. Dose of Taxol was modified due to neuropathy and cisplatin was omited last dose due to 'weakness'      09/13/2015 Imaging    Significant interval treatment response. Pulmonary and liver metastases have significantly decreased in size. Thoracic adenopathy has decreased in size. Mixed attenuation mass encompassing the uterus and bilateral adnexal regions has significantly decreased in size. No residual visualized peritoneal tumor implants. No new or progressive metastatic disease. 2. No evidence of bowel obstruction or acute bowel inflammation status post diverting sigmoid colostomy. 3. Additional findings include mild aortic atherosclerosis and Stable mild chronic superior T12 compression fracture.      12/14/2015 Imaging    Stable to improved interval exam. Multiple bilateral pulmonary nodules are stable in size to decreased and the liver lesions are stable to decreased. Mixed attenuation lesion encompassing the uterus and adnexal region shows decrease in size. There is no evidence for new or progressive disease on today's exam.      05/15/2016 Imaging    Findings today reflecting mixed interval  response to therapy. 2. There has been mild increase in size of pulmonary nodules.  3. Interval increase in size of pelvic mass 4. Further regression of liver metastases 5. There is a new soft tissue nodule within the right lower quadrant peritoneal cavity. Suspicious peritoneal disease      06/07/2016 -  Chemotherapy    She received chemo with cisplatin and gemzar      06/14/2016 Adverse Reaction    Cycle 1, day 8 is put on hold due to severe pancytopenia      08/29/2016 Imaging    Ct scan of chest, abdomen and pelvis: 1. General improvement, with reduced size of the scattered pulmonary nodules, and also a much more normal contour and appearance of the uterus, without the visible enhancing irregular mass shown on the 05/15/2016 exam involving the uterus and cervix. There still some mild soft tissue prominence of the cervix, and a cystic lesion of the left ovary as noted above. No pathologic adenopathy. 2. The right lower quadrant omental nodule has considerably improved and nearly resolved. 3. The prior hepatic metastatic lesions have essentially resolved, with only minimal residual linear hypodensity in segment 4 at the site of the prior large metastatic lesion shown on 05/15/2015.  4. Other imaging findings of potential clinical significance: Left lower quadrant colostomy. Chronic superior endplate compression at T11. Bony demineralization. Trace free pelvic fluid in the cul-de-sac. Central disc protrusion at L3-4. Left mid kidney hypodense lesion is likely a cyst.       INTERVAL HISTORY: Please see below for problem oriented charting. She returns for further follow-up She feels well except for excessive fatigue Denies recent infection Peripheral neuropathy is stable The patient denies any recent signs or symptoms of bleeding such as spontaneous epistaxis, hematuria or hematochezia. No recent cough, chest pain or shortness of breath  REVIEW OF SYSTEMS:   Constitutional: Denies fevers, chills or abnormal weight loss Eyes: Denies blurriness of vision Ears, nose,  mouth, throat, and face: Denies mucositis or sore throat Respiratory: Denies cough, dyspnea or wheezes Cardiovascular: Denies palpitation, chest discomfort or lower extremity swelling Gastrointestinal:  Denies nausea, heartburn or change in bowel habits Skin: Denies abnormal skin rashes Lymphatics: Denies new lymphadenopathy or easy bruising Neurological:Denies numbness, tingling or new weaknesses Behavioral/Psych: Mood is stable, no new changes  All other systems were reviewed with the patient and are negative.  I have reviewed the past medical history, past surgical history, social history and family history with the patient and they are unchanged from previous note.  ALLERGIES:  has No Known Allergies.  MEDICATIONS:  Current Outpatient Prescriptions  Medication Sig Dispense Refill  . cholecalciferol (VITAMIN D) 1000 units tablet Take 1,000 Units by mouth daily.    Marland Kitchen omeprazole (PRILOSEC) 20 MG capsule TAKE 1 CAPSULE BY MOUTH EVERY DAY 90 capsule 0  . temazepam (RESTORIL) 15 MG capsule Take 15 mg by mouth at bedtime as needed for sleep.     No current facility-administered medications for this visit.    Facility-Administered Medications Ordered in Other Visits  Medication Dose Route Frequency Provider Last Rate Last Dose  . heparin lock flush 100 unit/mL  500 Units Intravenous Once Livesay, Lennis P, MD      . heparin lock flush 100 unit/mL  500 Units Intracatheter Once PRN Alvy Bimler, Deverick Pruss, MD      . sodium chloride flush (NS) 0.9 % injection 10 mL  10 mL Intravenous PRN Livesay, Lennis P, MD      . sodium chloride  flush (NS) 0.9 % injection 10 mL  10 mL Intracatheter PRN Alvy Bimler, Jamon Hayhurst, MD      . sodium chloride flush (NS) 0.9 % injection 10 mL  10 mL Intracatheter PRN Alvy Bimler, Gesselle Fitzsimons, MD   10 mL at 10/05/16 1619    PHYSICAL EXAMINATION: ECOG PERFORMANCE STATUS: 1 - Symptomatic but completely ambulatory  Vitals:   10/05/16 0830  BP: 121/69  Pulse: 65  Resp: 20  Temp: 98.7 F (37.1 C)    Filed Weights   10/05/16 0830  Weight: 173 lb (78.5 kg)    GENERAL:alert, no distress and comfortable SKIN: skin color, texture, turgor are normal, no rashes or significant lesions EYES: normal, Conjunctiva are pink and non-injected, sclera clear OROPHARYNX:no exudate, no erythema and lips, buccal mucosa, and tongue normal  NECK: supple, thyroid normal size, non-tender, without nodularity LYMPH:  no palpable lymphadenopathy in the cervical, axillary or inguinal LUNGS: clear to auscultation and percussion with normal breathing effort HEART: regular rate & rhythm and no murmurs and no lower extremity edema ABDOMEN:abdomen soft, non-tender and normal bowel sounds Musculoskeletal:no cyanosis of digits and no clubbing  NEURO: alert & oriented x 3 with fluent speech, no focal motor/sensory deficits  LABORATORY DATA:  I have reviewed the data as listed    Component Value Date/Time   NA 141 10/05/2016 0744   K 4.1 10/05/2016 0744   CL 109 05/22/2015 0522   CO2 24 10/05/2016 0744   GLUCOSE 103 10/05/2016 0744   BUN 15.5 10/05/2016 0744   CREATININE 1.2 (H) 10/05/2016 0744   CALCIUM 8.9 10/05/2016 0744   PROT 6.4 10/05/2016 0744   ALBUMIN 3.5 10/05/2016 0744   AST 15 10/05/2016 0744   ALT 9 10/05/2016 0744   ALKPHOS 112 10/05/2016 0744   BILITOT 0.34 10/05/2016 0744   GFRNONAA >60 05/22/2015 0522   GFRAA >60 05/22/2015 0522    No results found for: SPEP, UPEP  Lab Results  Component Value Date   WBC 3.0 (L) 10/05/2016   NEUTROABS 1.0 (L) 10/05/2016   HGB 9.8 (L) 10/05/2016   HCT 29.0 (L) 10/05/2016   MCV 96.3 10/05/2016   PLT 198 10/05/2016      Chemistry      Component Value Date/Time   NA 141 10/05/2016 0744   K 4.1 10/05/2016 0744   CL 109 05/22/2015 0522   CO2 24 10/05/2016 0744   BUN 15.5 10/05/2016 0744   CREATININE 1.2 (H) 10/05/2016 0744      Component Value Date/Time   CALCIUM 8.9 10/05/2016 0744   ALKPHOS 112 10/05/2016 0744   AST 15 10/05/2016 0744    ALT 9 10/05/2016 0744   BILITOT 0.34 10/05/2016 0744      ASSESSMENT & PLAN:  Cervical cancer ([facility]) The patient tolerated treatment poorly due to excessive fatigue and pancytopenia She would like to get extra weeks of holidays to work around her planned vacation Her recent CT scan show positive response to treatment We negotiated the timing of her next cycle I plan to resume treatment next month to accommodate for her holiday plan and plan to repeat imaging study at the end of September We will continue treatment with reduced dose I would rather for her to continue to receive reduced dose treatment rather than G-CSF support for now  Pancytopenia, acquired ([place]) She is symptomatic from pancytopenia with excessive fatigue As above, we will plan to reduce the dose of chemotherapy and space out her future treatment She does not need transfusion support or  G-CSF  Chemotherapy-induced peripheral neuropathy ([place]) She has trace peripheral neuropathy from prior treatment. I plan to reduce the dose of cisplatin as above   No orders of the defined types were placed in this encounter.  All questions were answered. The patient knows to call the clinic with any problems, questions or concerns. No barriers to learning was detected. I spent 15 minutes counseling the patient face to face. The total time spent in the appointment was 20 minutes and more than 50% was on counseling and review of test results     Heath Lark, MD 10/05/2016 6:46 PM

## 2016-10-05 NOTE — Assessment & Plan Note (Signed)
She is symptomatic from pancytopenia with excessive fatigue As above, we will plan to reduce the dose of chemotherapy and space out her future treatment She does not need transfusion support or G-CSF

## 2016-10-05 NOTE — Assessment & Plan Note (Signed)
The patient tolerated treatment poorly due to excessive fatigue and pancytopenia She would like to get extra weeks of holidays to work around her planned vacation Her recent CT scan show positive response to treatment We negotiated the timing of her next cycle I plan to resume treatment next month to accommodate for her holiday plan and plan to repeat imaging study at the end of September We will continue treatment with reduced dose I would rather for her to continue to receive reduced dose treatment rather than G-CSF support for now

## 2016-11-07 ENCOUNTER — Telehealth: Payer: Self-pay | Admitting: *Deleted

## 2016-11-07 NOTE — Telephone Encounter (Signed)
I am not here on 9/21 I can see her with treatment on 9/20. I only have one spot on 9/20 at 37 am. If 9/20 works, please send scheduling msg to cancel her Rx on 9/7 and move everything to 9/20, see me at 830 am, 30 mins

## 2016-11-07 NOTE — Telephone Encounter (Signed)
Notified of appts on 9/20. Message to scheduler

## 2016-11-07 NOTE — Telephone Encounter (Signed)
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 Will not be in town for chemo on Friday. Will be back in town on 9/19- could do chemo on 9/20 or 9/21. She realizes that Dr Alvy Bimler would like to her come back sooner but she is trying to get beach home ready to sell.

## 2016-11-09 ENCOUNTER — Ambulatory Visit: Payer: Medicare HMO

## 2016-11-09 ENCOUNTER — Other Ambulatory Visit: Payer: Medicare HMO

## 2016-11-09 ENCOUNTER — Ambulatory Visit: Payer: Medicare HMO | Admitting: Hematology and Oncology

## 2016-11-22 ENCOUNTER — Other Ambulatory Visit (HOSPITAL_BASED_OUTPATIENT_CLINIC_OR_DEPARTMENT_OTHER): Payer: Medicare HMO

## 2016-11-22 ENCOUNTER — Encounter: Payer: Self-pay | Admitting: Hematology and Oncology

## 2016-11-22 ENCOUNTER — Ambulatory Visit (HOSPITAL_BASED_OUTPATIENT_CLINIC_OR_DEPARTMENT_OTHER): Payer: Medicare HMO

## 2016-11-22 ENCOUNTER — Ambulatory Visit (HOSPITAL_BASED_OUTPATIENT_CLINIC_OR_DEPARTMENT_OTHER): Payer: Medicare HMO | Admitting: Hematology and Oncology

## 2016-11-22 VITALS — BP 121/67 | HR 66 | Temp 98.2°F | Resp 18 | Ht 64.5 in | Wt 173.1 lb

## 2016-11-22 DIAGNOSIS — C7802 Secondary malignant neoplasm of left lung: Secondary | ICD-10-CM

## 2016-11-22 DIAGNOSIS — G620 Drug-induced polyneuropathy: Secondary | ICD-10-CM | POA: Diagnosis not present

## 2016-11-22 DIAGNOSIS — C539 Malignant neoplasm of cervix uteri, unspecified: Secondary | ICD-10-CM

## 2016-11-22 DIAGNOSIS — C787 Secondary malignant neoplasm of liver and intrahepatic bile duct: Secondary | ICD-10-CM | POA: Diagnosis not present

## 2016-11-22 DIAGNOSIS — C7801 Secondary malignant neoplasm of right lung: Secondary | ICD-10-CM

## 2016-11-22 DIAGNOSIS — N183 Chronic kidney disease, stage 3 unspecified: Secondary | ICD-10-CM | POA: Insufficient documentation

## 2016-11-22 DIAGNOSIS — Z5111 Encounter for antineoplastic chemotherapy: Secondary | ICD-10-CM

## 2016-11-22 DIAGNOSIS — C799 Secondary malignant neoplasm of unspecified site: Secondary | ICD-10-CM

## 2016-11-22 DIAGNOSIS — T451X5A Adverse effect of antineoplastic and immunosuppressive drugs, initial encounter: Secondary | ICD-10-CM

## 2016-11-22 LAB — MAGNESIUM: Magnesium: 1.7 mg/dl (ref 1.5–2.5)

## 2016-11-22 LAB — CBC WITH DIFFERENTIAL/PLATELET
BASO%: 0.5 % (ref 0.0–2.0)
Basophils Absolute: 0 10*3/uL (ref 0.0–0.1)
EOS ABS: 0.1 10*3/uL (ref 0.0–0.5)
EOS%: 3.4 % (ref 0.0–7.0)
HCT: 31.9 % — ABNORMAL LOW (ref 34.8–46.6)
HGB: 10.6 g/dL — ABNORMAL LOW (ref 11.6–15.9)
LYMPH%: 36.5 % (ref 14.0–49.7)
MCH: 30.8 pg (ref 25.1–34.0)
MCHC: 33.2 g/dL (ref 31.5–36.0)
MCV: 92.7 fL (ref 79.5–101.0)
MONO#: 0.5 10*3/uL (ref 0.1–0.9)
MONO%: 12.8 % (ref 0.0–14.0)
NEUT%: 46.8 % (ref 38.4–76.8)
NEUTROS ABS: 1.9 10*3/uL (ref 1.5–6.5)
Platelets: 146 10*3/uL (ref 145–400)
RBC: 3.44 10*6/uL — AB (ref 3.70–5.45)
RDW: 14 % (ref 11.2–14.5)
WBC: 4.1 10*3/uL (ref 3.9–10.3)
lymph#: 1.5 10*3/uL (ref 0.9–3.3)

## 2016-11-22 LAB — COMPREHENSIVE METABOLIC PANEL
ALBUMIN: 3.6 g/dL (ref 3.5–5.0)
ALK PHOS: 130 U/L (ref 40–150)
ALT: 10 U/L (ref 0–55)
ANION GAP: 8 meq/L (ref 3–11)
AST: 18 U/L (ref 5–34)
BILIRUBIN TOTAL: 0.46 mg/dL (ref 0.20–1.20)
BUN: 21.4 mg/dL (ref 7.0–26.0)
CALCIUM: 9.5 mg/dL (ref 8.4–10.4)
CO2: 26 mEq/L (ref 22–29)
CREATININE: 1.2 mg/dL — AB (ref 0.6–1.1)
Chloride: 107 mEq/L (ref 98–109)
EGFR: 44 mL/min/{1.73_m2} — AB (ref >90)
Glucose: 84 mg/dl (ref 70–140)
Potassium: 4.2 mEq/L (ref 3.5–5.1)
Sodium: 141 mEq/L (ref 136–145)
TOTAL PROTEIN: 7.1 g/dL (ref 6.4–8.3)

## 2016-11-22 MED ORDER — FOSAPREPITANT DIMEGLUMINE INJECTION 150 MG
Freq: Once | INTRAVENOUS | Status: AC
  Administered 2016-11-22: 12:00:00 via INTRAVENOUS
  Filled 2016-11-22: qty 5

## 2016-11-22 MED ORDER — PALONOSETRON HCL INJECTION 0.25 MG/5ML
INTRAVENOUS | Status: AC
  Filled 2016-11-22: qty 5

## 2016-11-22 MED ORDER — SODIUM CHLORIDE 0.9% FLUSH
10.0000 mL | INTRAVENOUS | Status: DC | PRN
  Administered 2016-11-22: 10 mL
  Filled 2016-11-22: qty 10

## 2016-11-22 MED ORDER — PALONOSETRON HCL INJECTION 0.25 MG/5ML
0.2500 mg | Freq: Once | INTRAVENOUS | Status: AC
  Administered 2016-11-22: 0.25 mg via INTRAVENOUS

## 2016-11-22 MED ORDER — MANNITOL 25 % IV SOLN
Freq: Once | INTRAVENOUS | Status: AC
  Administered 2016-11-22: 10:00:00 via INTRAVENOUS
  Filled 2016-11-22: qty 10

## 2016-11-22 MED ORDER — SODIUM CHLORIDE 0.9 % IV SOLN
26.5000 mg/m2 | Freq: Once | INTRAVENOUS | Status: AC
  Administered 2016-11-22: 50 mg via INTRAVENOUS
  Filled 2016-11-22: qty 50

## 2016-11-22 MED ORDER — SODIUM CHLORIDE 0.9 % IV SOLN
Freq: Once | INTRAVENOUS | Status: AC
  Administered 2016-11-22: 09:00:00 via INTRAVENOUS

## 2016-11-22 MED ORDER — SODIUM CHLORIDE 0.9 % IV SOLN
500.0000 mg/m2 | Freq: Once | INTRAVENOUS | Status: AC
  Administered 2016-11-22: 950 mg via INTRAVENOUS
  Filled 2016-11-22: qty 24.98

## 2016-11-22 MED ORDER — HEPARIN SOD (PORK) LOCK FLUSH 100 UNIT/ML IV SOLN
500.0000 [IU] | Freq: Once | INTRAVENOUS | Status: AC | PRN
  Administered 2016-11-22: 500 [IU]
  Filled 2016-11-22: qty 5

## 2016-11-22 NOTE — Progress Notes (Signed)
Dr. Alvy Bimler advised ok to proceed with Gemzar/Cisplatin without urine output. Will encourage patient to increase oral fluid intake.

## 2016-11-22 NOTE — Assessment & Plan Note (Signed)
She will continue reduced dose cisplatin

## 2016-11-22 NOTE — Assessment & Plan Note (Signed)
So far, her liver enzymes are stable I am a little concerned about her occasional sensation of feeling hot, that could represent tumor fever As above, I plan to repeat imaging study next month to assess response to treatment

## 2016-11-22 NOTE — Assessment & Plan Note (Signed)
She is not symptomatic from lung metastasis

## 2016-11-22 NOTE — Assessment & Plan Note (Signed)
She has trace peripheral neuropathy from prior treatment. I plan to reduce the dose of cisplatin as above

## 2016-11-22 NOTE — Assessment & Plan Note (Signed)
The patient tolerated treatment poorly due to excessive fatigue and pancytopenia She would like to get extra weeks of holidays to work around her planned vacation We will continue treatment with reduced dose I plan to repeat imaging study in 3 weeks

## 2016-11-22 NOTE — Patient Instructions (Signed)
[facility] Discharge Instructions for Patients Receiving Chemotherapy  Today you received the following chemotherapy agents:  Gemzar and Cisplatin.  To help prevent nausea and vomiting after your treatment, we encourage you to take your nausea medication as directed.   If you develop nausea and vomiting that is not controlled by your nausea medication, call the clinic.   BELOW ARE SYMPTOMS THAT SHOULD BE REPORTED IMMEDIATELY:  *FEVER GREATER THAN 100.5 F  *CHILLS WITH OR WITHOUT FEVER  NAUSEA AND VOMITING THAT IS NOT CONTROLLED WITH YOUR NAUSEA MEDICATION  *UNUSUAL SHORTNESS OF BREATH  *UNUSUAL BRUISING OR BLEEDING  TENDERNESS IN MOUTH AND THROAT WITH OR WITHOUT PRESENCE OF ULCERS  *URINARY PROBLEMS  *BOWEL PROBLEMS  UNUSUAL RASH Items with * indicate a potential emergency and should be followed up as soon as possible.  Feel free to call the clinic you have any questions or concerns. The clinic phone number is [phone].  Please show the CHEMO ALERT CARD at check-in to the Emergency Department and triage nurse.   

## 2016-11-22 NOTE — Progress Notes (Signed)
[facility] OFFICE PROGRESS NOTE  Patient Care Team: [name], MD as PCP - General (Family Medicine) Gordy Levan, MD as Consulting Physician (Oncology) Carmel Sacramento, OD as Consulting Physician (Optometry)  SUMMARY OF ONCOLOGIC HISTORY:   Cervical cancer ([place])   04/26/2015 Imaging    Numerous bilateral pulmonary nodules consistent with metastatic disease are associated with liver lesions, omental nodules, mesenteric nodules, necrotic retroperitoneal lymphadenopathy in a mixed cystic and solid mass in the central pelvis involving the uterus extending into the cul-de-sac and both adnexal regions. Uterine primary (likely endometrial) is favored over an ovarian primary malignancy given the lack of ascites, associated intrahepatic metastases and pulmonary involvement.      05/04/2015 Procedure    Technically successful CT-guided core biopsy of left pelvic mass      05/04/2015 Pathology Results    Soft Tissue Needle Core Biopsy, left ext iliac mass METASTATIC HIGH GRADE POORLY DIFFERENTIATED CARCINOMA CONSISTENT WITH CERVICAL ORIGIN Microscopic Comment The neoplasm stains positive for cervical markers : p16, ER, ck8/18, and p63 (focal), TTF-1 (focal) and negative for synaptophysin, Chromogranin (neuroendocrine markers) and ck5/6. The morphology and immunohistochemistry staining pattern support these cells are GYN cervical origin.Spec      05/10/2015 Imaging    Innumerable (> than 40) pulmonary nodules randomly distributed throughout both lungs, largest 1.1 cm, in keeping with pulmonary metastases. 2. Left supraclavicular and left hilar nodal metastases. 3. Re- demonstration of liver metastases. 4. Right thyroid lobe 1.4 cm pulmonary nodule, for which no further evaluation is recommended. 5. Stable mild superior T11 vertebral compression deformity.      05/12/2015 - 05/12/2015 Chemotherapy    She received 1 dose of carbo/taxol only      05/15/2015 Imaging    Widely  metastatic cervical carcinoma with unchanged appearance compared to 04/26/2015. Bulky tumor narrows and likely invades the rectum/distal sigmoid with progressive proximal stool retention      05/15/2015 - 05/23/2015 Hospital Admission    She was admitted for severe bowel obstruction requiring urgent diversion colostomy      05/18/2015 Surgery    She underwent LAPAROSCOPIC DIVERTING SIGMOID LOOP COLOSTOMY for bowel obstruction       06/10/2015 Procedure    Successful placement of a right internal jugular approach power injectable Port-A-Cath. The catheter is ready for immediate use      06/23/2015 - 03/01/2016 Chemotherapy    She received combination treatment with cisplatin, Taxol and Avastin. Avastin was started on 5/11. Dose of Taxol was modified due to neuropathy and cisplatin was omited last dose due to 'weakness'      09/13/2015 Imaging    Significant interval treatment response. Pulmonary and liver metastases have significantly decreased in size. Thoracic adenopathy has decreased in size. Mixed attenuation mass encompassing the uterus and bilateral adnexal regions has significantly decreased in size. No residual visualized peritoneal tumor implants. No new or progressive metastatic disease. 2. No evidence of bowel obstruction or acute bowel inflammation status post diverting sigmoid colostomy. 3. Additional findings include mild aortic atherosclerosis and Stable mild chronic superior T12 compression fracture.      12/14/2015 Imaging    Stable to improved interval exam. Multiple bilateral pulmonary nodules are stable in size to decreased and the liver lesions are stable to decreased. Mixed attenuation lesion encompassing the uterus and adnexal region shows decrease in size. There is no evidence for new or progressive disease on today's exam.      05/15/2016 Imaging    Findings today reflecting mixed interval  response to therapy. 2. There has been mild increase in size of pulmonary nodules.  3. Interval increase in size of pelvic mass 4. Further regression of liver metastases 5. There is a new soft tissue nodule within the right lower quadrant peritoneal cavity. Suspicious peritoneal disease      06/07/2016 -  Chemotherapy    She received chemo with cisplatin and gemzar      06/14/2016 Adverse Reaction    Cycle 1, day 8 is put on hold due to severe pancytopenia      08/29/2016 Imaging    Ct scan of chest, abdomen and pelvis: 1. General improvement, with reduced size of the scattered pulmonary nodules, and also a much more normal contour and appearance of the uterus, without the visible enhancing irregular mass shown on the 05/15/2016 exam involving the uterus and cervix. There still some mild soft tissue prominence of the cervix, and a cystic lesion of the left ovary as noted above. No pathologic adenopathy. 2. The right lower quadrant omental nodule has considerably improved and nearly resolved. 3. The prior hepatic metastatic lesions have essentially resolved, with only minimal residual linear hypodensity in segment 4 at the site of the prior large metastatic lesion shown on 05/15/2015.  4. Other imaging findings of potential clinical significance: Left lower quadrant colostomy. Chronic superior endplate compression at T11. Bony demineralization. Trace free pelvic fluid in the cul-de-sac. Central disc protrusion at L3-4. Left mid kidney hypodense lesion is likely a cyst.       INTERVAL HISTORY: Please see below for problem oriented charting. She returns for chemotherapy. She complain of mild peripheral neuropathy, stable She denies chest pain or shortness of breath She has some mild loose stool but denies significant diarrhea She was able to hydrate herself She denies mucositis, nausea or vomiting She denies pain She complain of intermittent sensation of feeling hot but not classical symptom of hot flashes.  Denies night sweats  REVIEW OF SYSTEMS:   Constitutional: Denies  fevers, chills or abnormal weight loss Eyes: Denies blurriness of vision Ears, nose, mouth, throat, and face: Denies mucositis or sore throat Respiratory: Denies cough, dyspnea or wheezes Cardiovascular: Denies palpitation, chest discomfort or lower extremity swelling Gastrointestinal:  Denies nausea, heartburn or change in bowel habits Skin: Denies abnormal skin rashes Lymphatics: Denies new lymphadenopathy or easy bruising Neurological:Denies numbness, tingling or new weaknesses Behavioral/Psych: Mood is stable, no new changes  All other systems were reviewed with the patient and are negative.  I have reviewed the past medical history, past surgical history, social history and family history with the patient and they are unchanged from previous note.  ALLERGIES:  has No Known Allergies.  MEDICATIONS:  Current Outpatient Prescriptions  Medication Sig Dispense Refill  . cholecalciferol (VITAMIN D) 1000 units tablet Take 1,000 Units by mouth daily.    Marland Kitchen omeprazole (PRILOSEC) 20 MG capsule TAKE 1 CAPSULE BY MOUTH EVERY DAY 90 capsule 0  . temazepam (RESTORIL) 15 MG capsule Take 15 mg by mouth at bedtime as needed for sleep.     No current facility-administered medications for this visit.    Facility-Administered Medications Ordered in Other Visits  Medication Dose Route Frequency Provider Last Rate Last Dose  . 0.9 %  sodium chloride infusion   Intravenous Once Lesa Vandall, MD      . dextrose 5 % and 0.45% NaCl 1,000 mL with potassium chloride 20 mEq, magnesium sulfate 12 mEq, mannitol 12.5 g infusion   Intravenous Once Heath Lark, MD      .  heparin lock flush 100 unit/mL  500 Units Intravenous Once Livesay, Lennis P, MD      . heparin lock flush 100 unit/mL  500 Units Intracatheter Once PRN Alvy Bimler, Deondra Labrador, MD      . sodium chloride flush (NS) 0.9 % injection 10 mL  10 mL Intravenous PRN Livesay, Lennis P, MD      . sodium chloride flush (NS) 0.9 % injection 10 mL  10 mL Intracatheter PRN  Alvy Bimler, Jvon Meroney, MD        PHYSICAL EXAMINATION: ECOG PERFORMANCE STATUS: 1 - Symptomatic but completely ambulatory  Vitals:   11/22/16 0809  BP: 121/67  Pulse: 66  Resp: 18  Temp: 98.2 F (36.8 C)  SpO2: 98%   Filed Weights   11/22/16 0809  Weight: 173 lb 1.6 oz (78.5 kg)    GENERAL:alert, no distress and comfortable SKIN: skin color, texture, turgor are normal, no rashes or significant lesions EYES: normal, Conjunctiva are pink and non-injected, sclera clear OROPHARYNX:no exudate, no erythema and lips, buccal mucosa, and tongue normal  NECK: supple, thyroid normal size, non-tender, without nodularity LYMPH:  no palpable lymphadenopathy in the cervical, axillary or inguinal LUNGS: clear to auscultation and percussion with normal breathing effort HEART: regular rate & rhythm and no murmurs and no lower extremity edema ABDOMEN:abdomen soft, non-tender and normal bowel sounds Musculoskeletal:no cyanosis of digits and no clubbing  NEURO: alert & oriented x 3 with fluent speech, no focal motor/sensory deficits  LABORATORY DATA:  I have reviewed the data as listed    Component Value Date/Time   NA 141 11/22/2016 0750   K 4.2 11/22/2016 0750   CL 109 05/22/2015 0522   CO2 26 11/22/2016 0750   GLUCOSE 84 11/22/2016 0750   BUN 21.4 11/22/2016 0750   CREATININE 1.2 (H) 11/22/2016 0750   CALCIUM 9.5 11/22/2016 0750   PROT 7.1 11/22/2016 0750   ALBUMIN 3.6 11/22/2016 0750   AST 18 11/22/2016 0750   ALT 10 11/22/2016 0750   ALKPHOS 130 11/22/2016 0750   BILITOT 0.46 11/22/2016 0750   GFRNONAA >60 05/22/2015 0522   GFRAA >60 05/22/2015 0522    No results found for: SPEP, UPEP  Lab Results  Component Value Date   WBC 4.1 11/22/2016   NEUTROABS 1.9 11/22/2016   HGB 10.6 (L) 11/22/2016   HCT 31.9 (L) 11/22/2016   MCV 92.7 11/22/2016   PLT 146 11/22/2016      Chemistry      Component Value Date/Time   NA 141 11/22/2016 0750   K 4.2 11/22/2016 0750   CL 109 05/22/2015  0522   CO2 26 11/22/2016 0750   BUN 21.4 11/22/2016 0750   CREATININE 1.2 (H) 11/22/2016 0750      Component Value Date/Time   CALCIUM 9.5 11/22/2016 0750   ALKPHOS 130 11/22/2016 0750   AST 18 11/22/2016 0750   ALT 10 11/22/2016 0750   BILITOT 0.46 11/22/2016 0750      ASSESSMENT & PLAN:  Cervical cancer ([facility]) The patient tolerated treatment poorly due to excessive fatigue and pancytopenia She would like to get extra weeks of holidays to work around her planned vacation We will continue treatment with reduced dose I plan to repeat imaging study in 3 weeks  Malignant neoplasm metastatic to left lung ([place]) She is not symptomatic from lung metastasis  Metastases to the liver [facility]) So far, her liver enzymes are stable I am a little concerned about her occasional sensation of feeling hot, that could represent  tumor fever As above, I plan to repeat imaging study next month to assess response to treatment  Chemotherapy-induced peripheral neuropathy ([place]) She has trace peripheral neuropathy from prior treatment. I plan to reduce the dose of cisplatin as above  Chronic kidney disease, stage III (moderate) She will continue reduced dose cisplatin   Orders Placed This Encounter  Procedures  . CT CHEST W CONTRAST    Standing Status:   Future    Standing Expiration Date:   11/22/2017    Order Specific Question:   If indicated for the ordered procedure, I authorize the administration of contrast media per Radiology protocol    Answer:   Yes    Order Specific Question:   Preferred imaging location?    Answer:   [facility]    Order Specific Question:   Radiology Contrast Protocol - do NOT remove file path    Answer:   \\[email or web address]  . CT ABDOMEN PELVIS W CONTRAST    Standing Status:   Future    Standing Expiration Date:   11/22/2017    Order Specific Question:   If indicated for the ordered procedure, I authorize the administration of  contrast media per Radiology protocol    Answer:   Yes    Order Specific Question:   Preferred imaging location?    Answer:   [facility]    Order Specific Question:   Radiology Contrast Protocol - do NOT remove file path    Answer:   \\[email or web address]   All questions were answered. The patient knows to call the clinic with any problems, questions or concerns. No barriers to learning was detected. I spent 25 minutes counseling the patient face to face. The total time spent in the appointment was 30 minutes and more than 50% was on counseling and review of test results     Heath Lark, MD 11/22/2016 9:02 AM

## 2016-11-26 IMAGING — CT CT ABD-PELV W/ CM
2 of 5 series · 16 of 46 positions shown, 18 images · IV contrast (OMNIPAQUE 300)
Comparison: 04/26/2015

CLINICAL DATA: History of metastatic cervical cancer. Left lower
quadrant pain.

EXAM:
CT ABDOMEN AND PELVIS WITH CONTRAST
TECHNIQUE: Multidetector CT imaging of the abdomen and pelvis was performed
using the standard protocol following bolus administration of
intravenous contrast.
CONTRAST:  100mL OMNIPAQUE IOHEXOL 300 MG/ML  SOLN

[Series 2: abd/pel with · axial · 0.80mm/px · z∈[+630,+1054]mm · 13 of 96 slices shown, 15 images]
[im 6/96  soft-tissue]
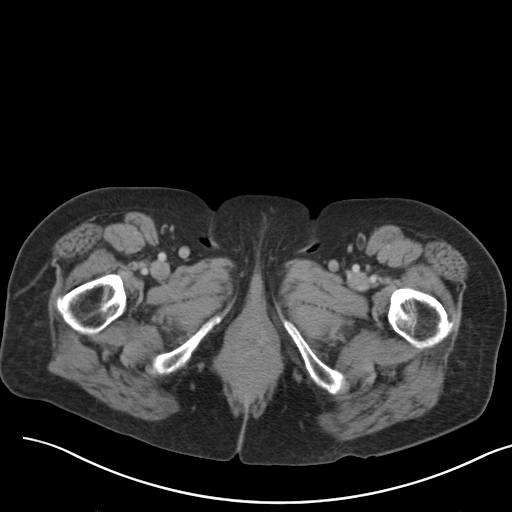
[im 6/96  bone]
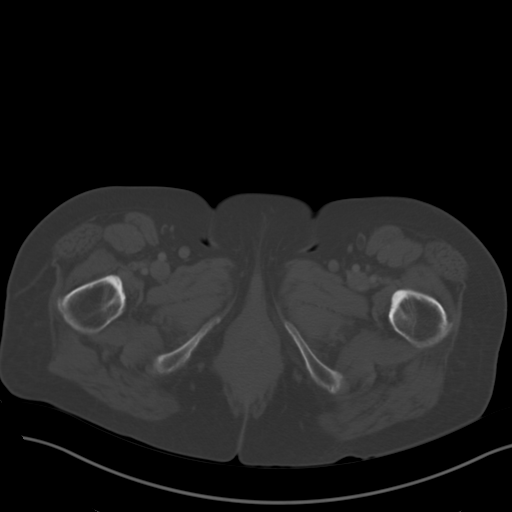
[im 16/96  soft-tissue]
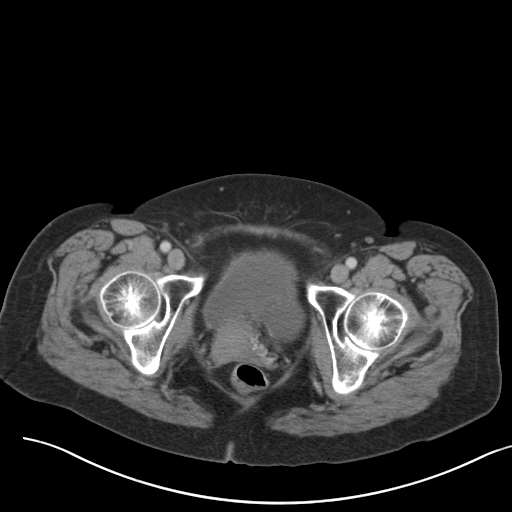
[im 21/96  soft-tissue]
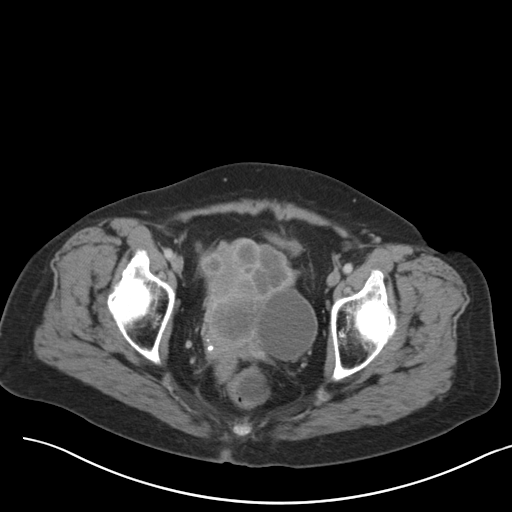
[im 26/96  soft-tissue]
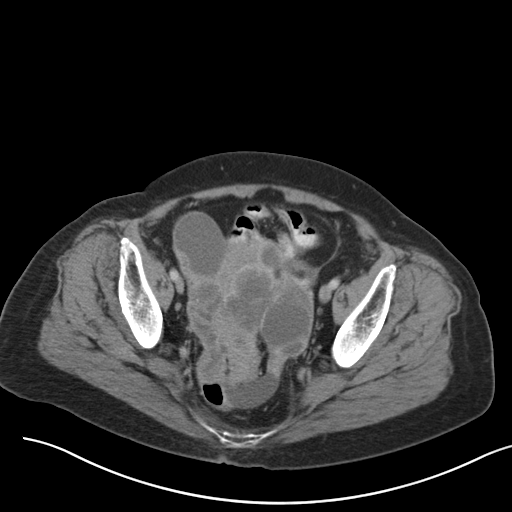
[im 36/96  soft-tissue]
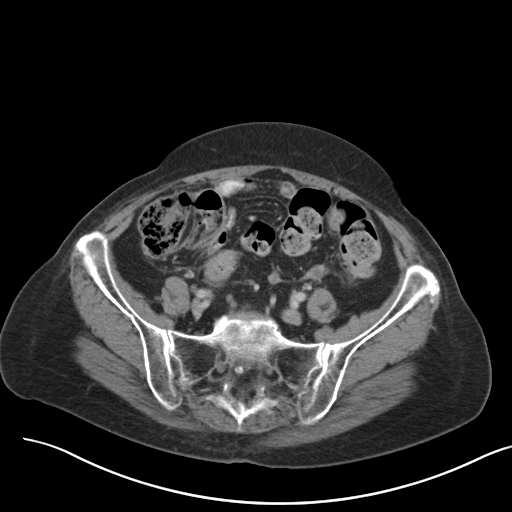
[im 41/96  soft-tissue]
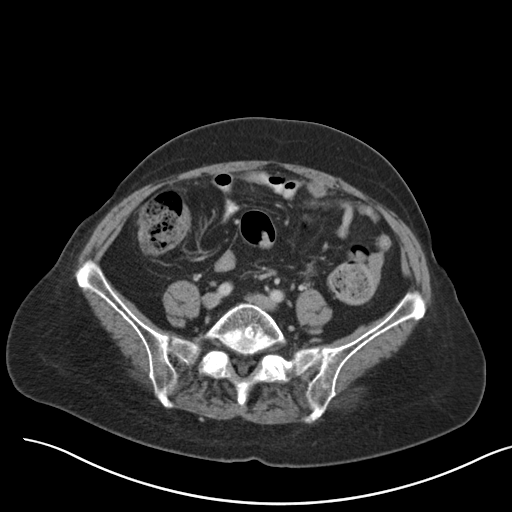
[im 51/96  soft-tissue]
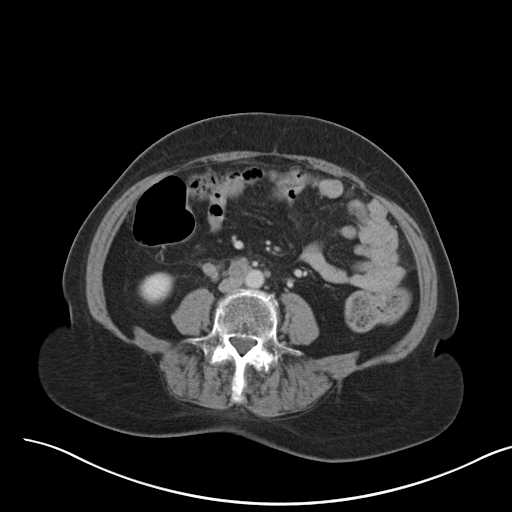
[im 56/96  soft-tissue]
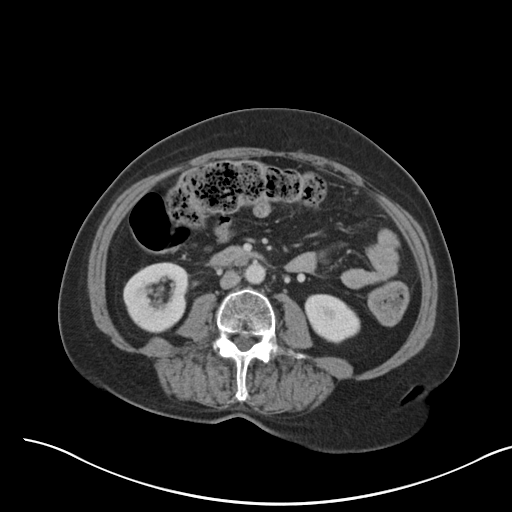
[im 61/96  soft-tissue]
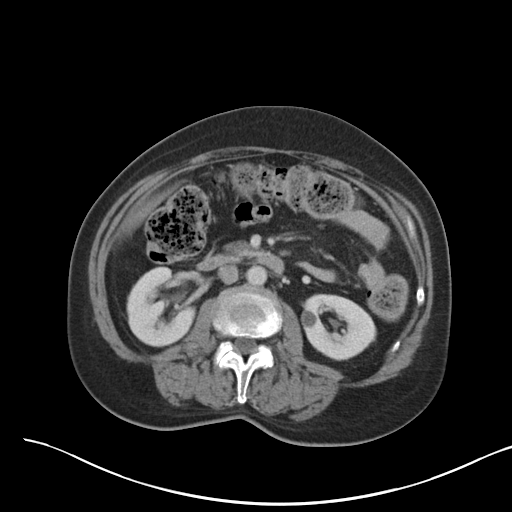
[im 61/96  bone]
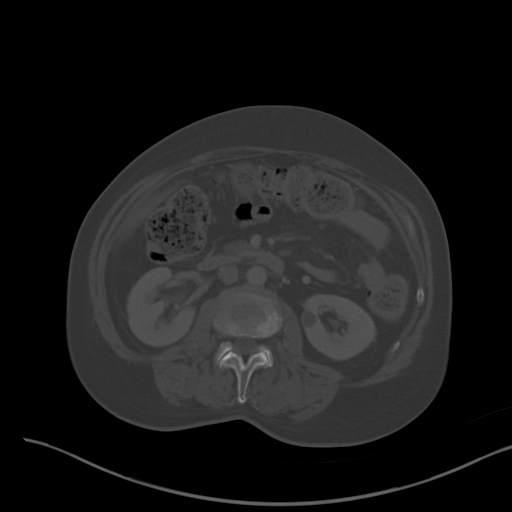
[im 71/96  soft-tissue]
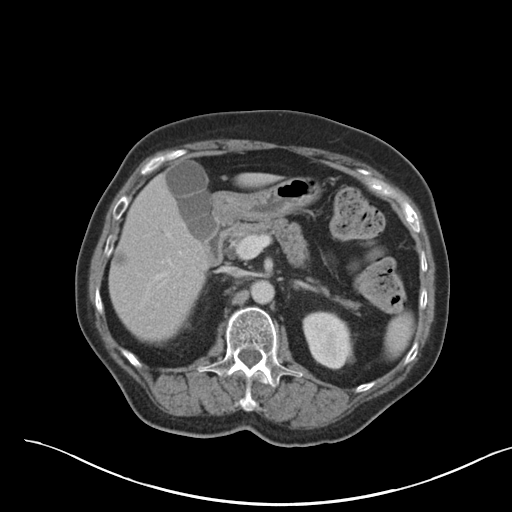
[im 76/96  soft-tissue]
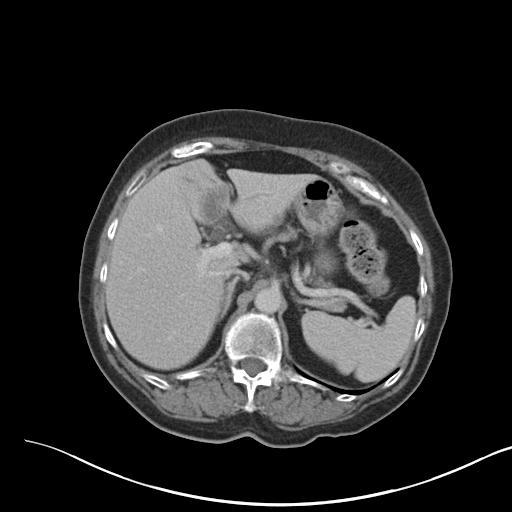
[im 81/96  soft-tissue]
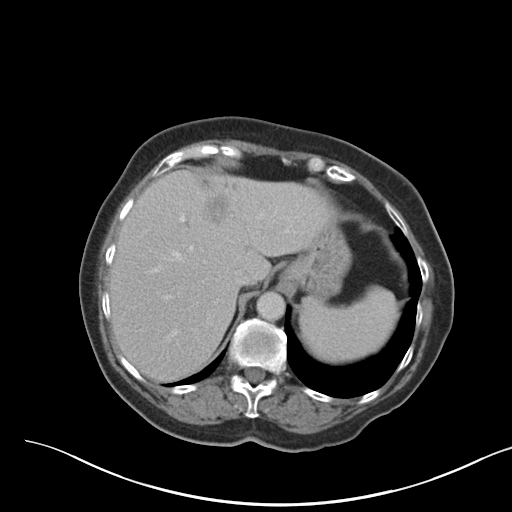
[im 91/96  soft-tissue]
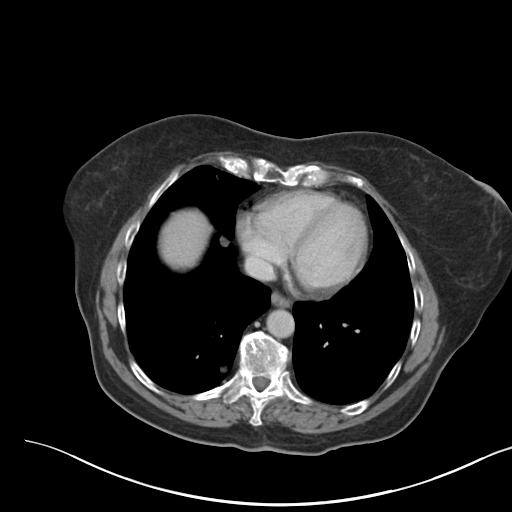

[Series 4: coronal a/|p · coronal · 0.74mm/px · 3 of 98 slices shown]
[im 33/98  soft-tissue]
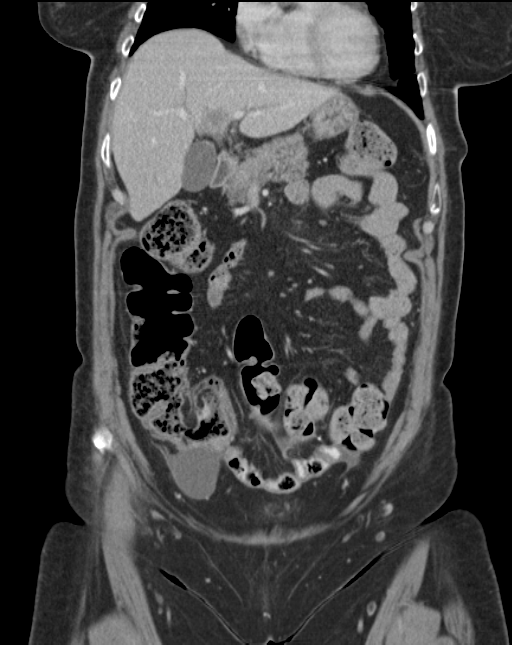
[im 44/98  soft-tissue]
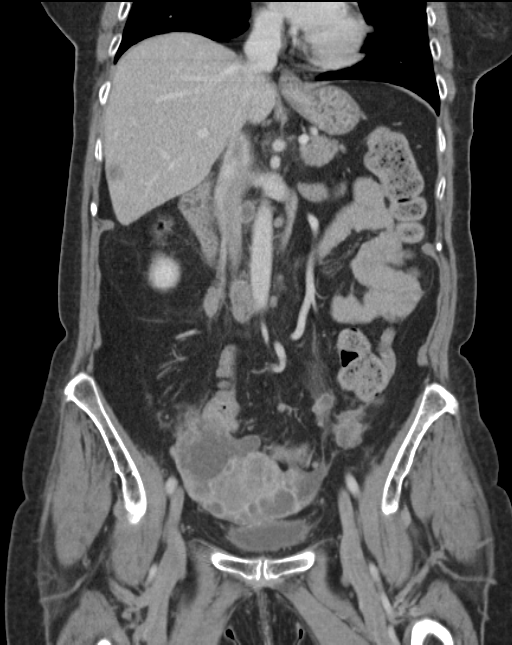
[im 54/98  soft-tissue]
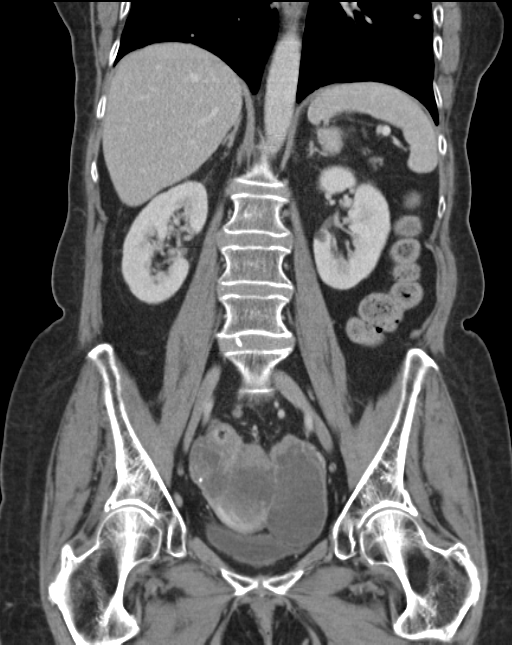

[16 of 46 positions shown; findings below may reference images not displayed]

FINDINGS: Lower chest and abdominal wall: Pulmonary nodules from metastatic
disease. No superimposed acute finding.

Hepatobiliary: Hepatic metastases, largest in segment 4B measuring
38 mm. There is associated perfusion anomaly and peripheral biliary
duct obstruction around this largest metastasis.

Pancreas: Unremarkable.

Spleen: Unremarkable.

Adrenals/Urinary Tract: Negative adrenals. No hydronephrosis despite
bulky pelvic mass. Left renal cyst. Unremarkable bladder.

Reproductive:Bulky multi cystic pelvic mass with grossly similar
appearance, again measuring up to 14 cm in maximal dimension. Based
on biopsy this is cervical carcinoma. There is extensive contact and
probable invasion of the rectum and lower sigmoid. Lower pelvic
peritoneal nodules, the largest in the low left pericolic gutter
measuring 28 mm and contacting the descending sigmoid junction.
Another prominent nodule contacts the cecum. Retroperitoneal
malignant adenopathy highest at the right para-aortic station at the
level of the renal artery, measuring 30 mm.

Stomach/Bowel: Diffuse narrowing of the rectum and distal sigmoid
related to tumor deposits. Above the sigmoid there is diffuse stool.
No evidence of perforation or abscess.

Vascular/Lymphatic: No acute vascular abnormality. Adenopathy as
described above.

Musculoskeletal: No acute abnormalities. No metastatic deposit
identified. Remote T11 compression fracture.
IMPRESSION: Widely metastatic cervical carcinoma with unchanged appearance
compared to 04/26/2015. Bulky tumor narrows and likely invades the
rectum/distal sigmoid with progressive proximal stool retention.

## 2016-11-27 ENCOUNTER — Telehealth: Payer: Self-pay

## 2016-11-27 IMAGING — DX DG ABDOMEN 2V
2 series · 2 of 2 positions shown · non-contrast
Comparison: 05/15/2015

CLINICAL DATA: No bowel movement for 1 week

EXAM:
ABDOMEN - 2 VIEW

[abdomen erect]
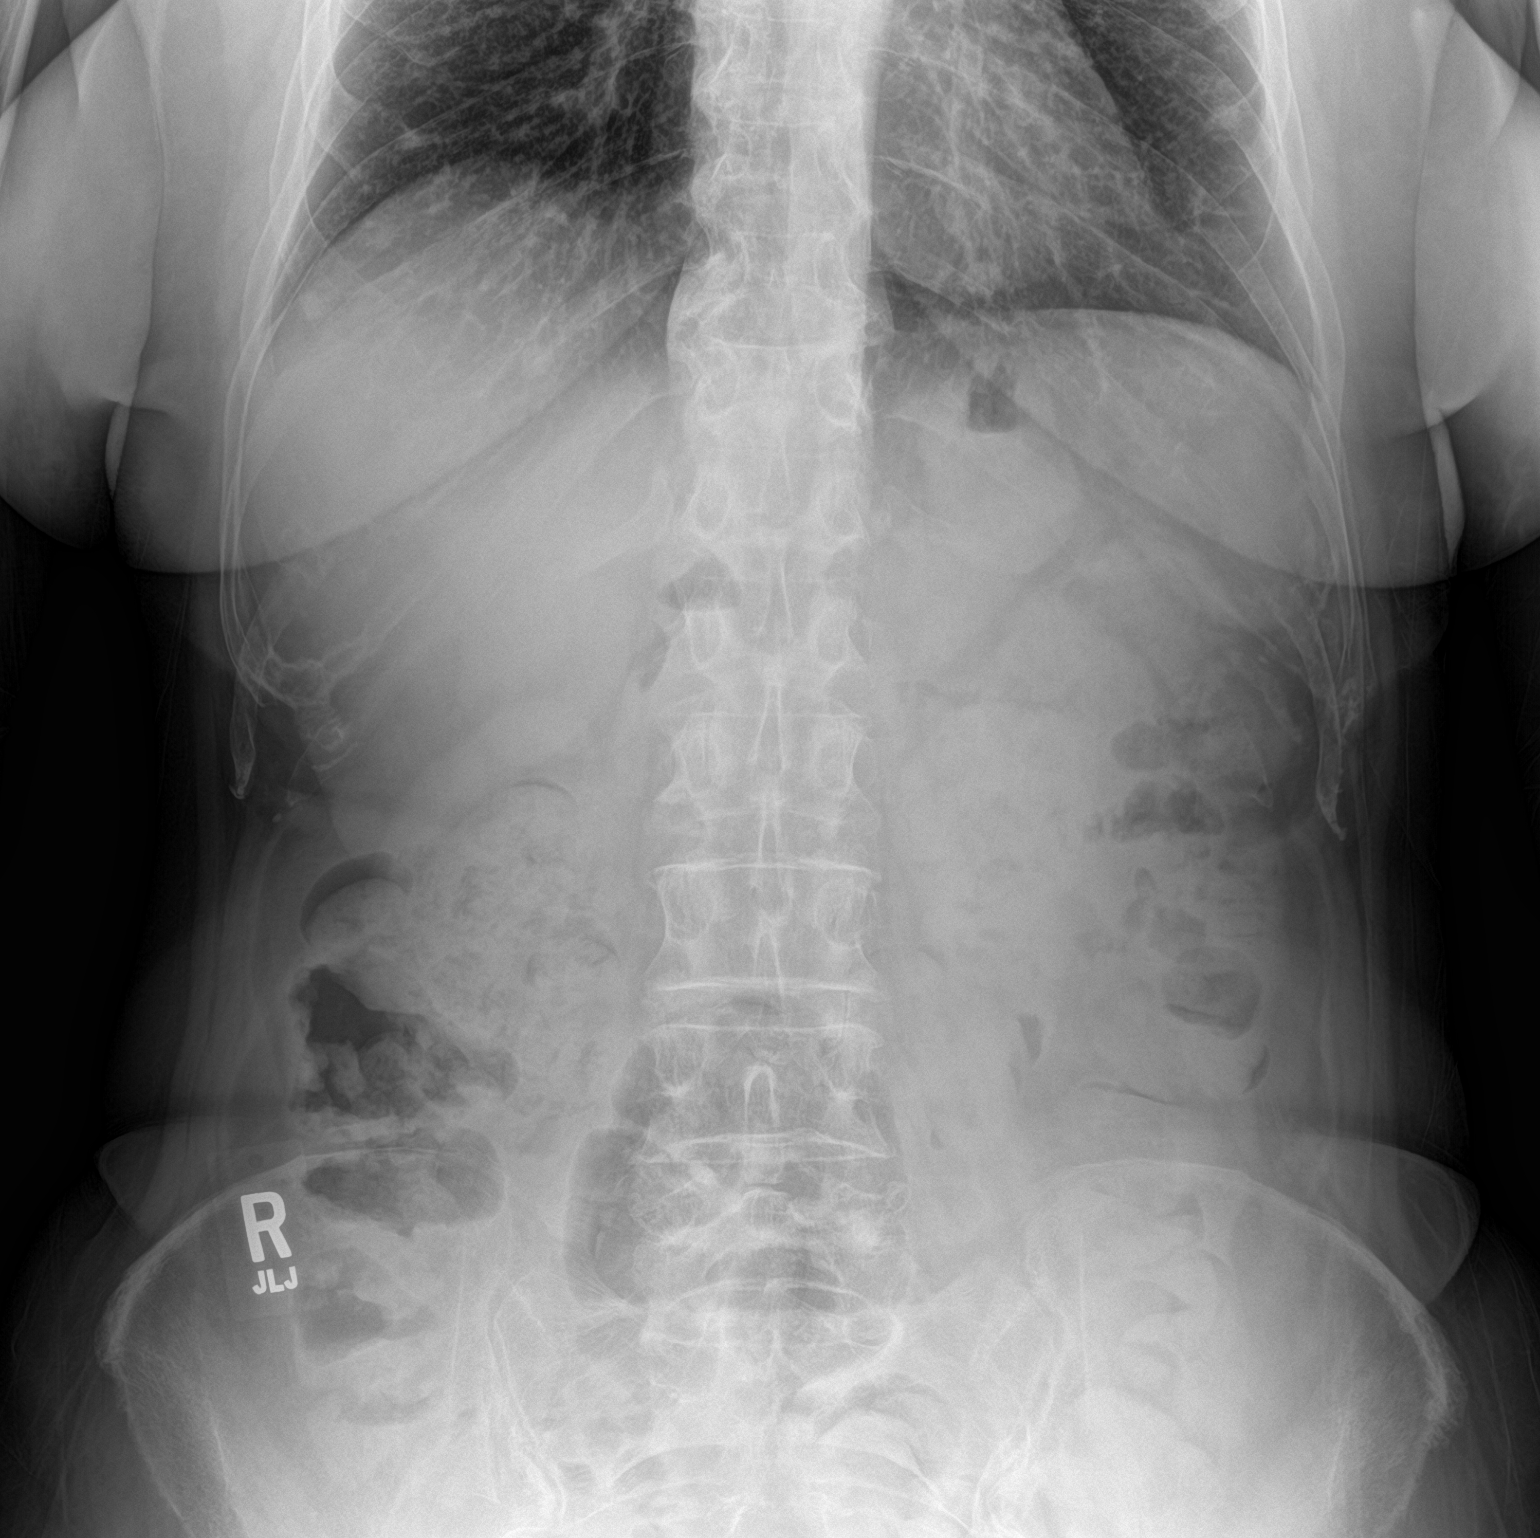

[abdomen supine]
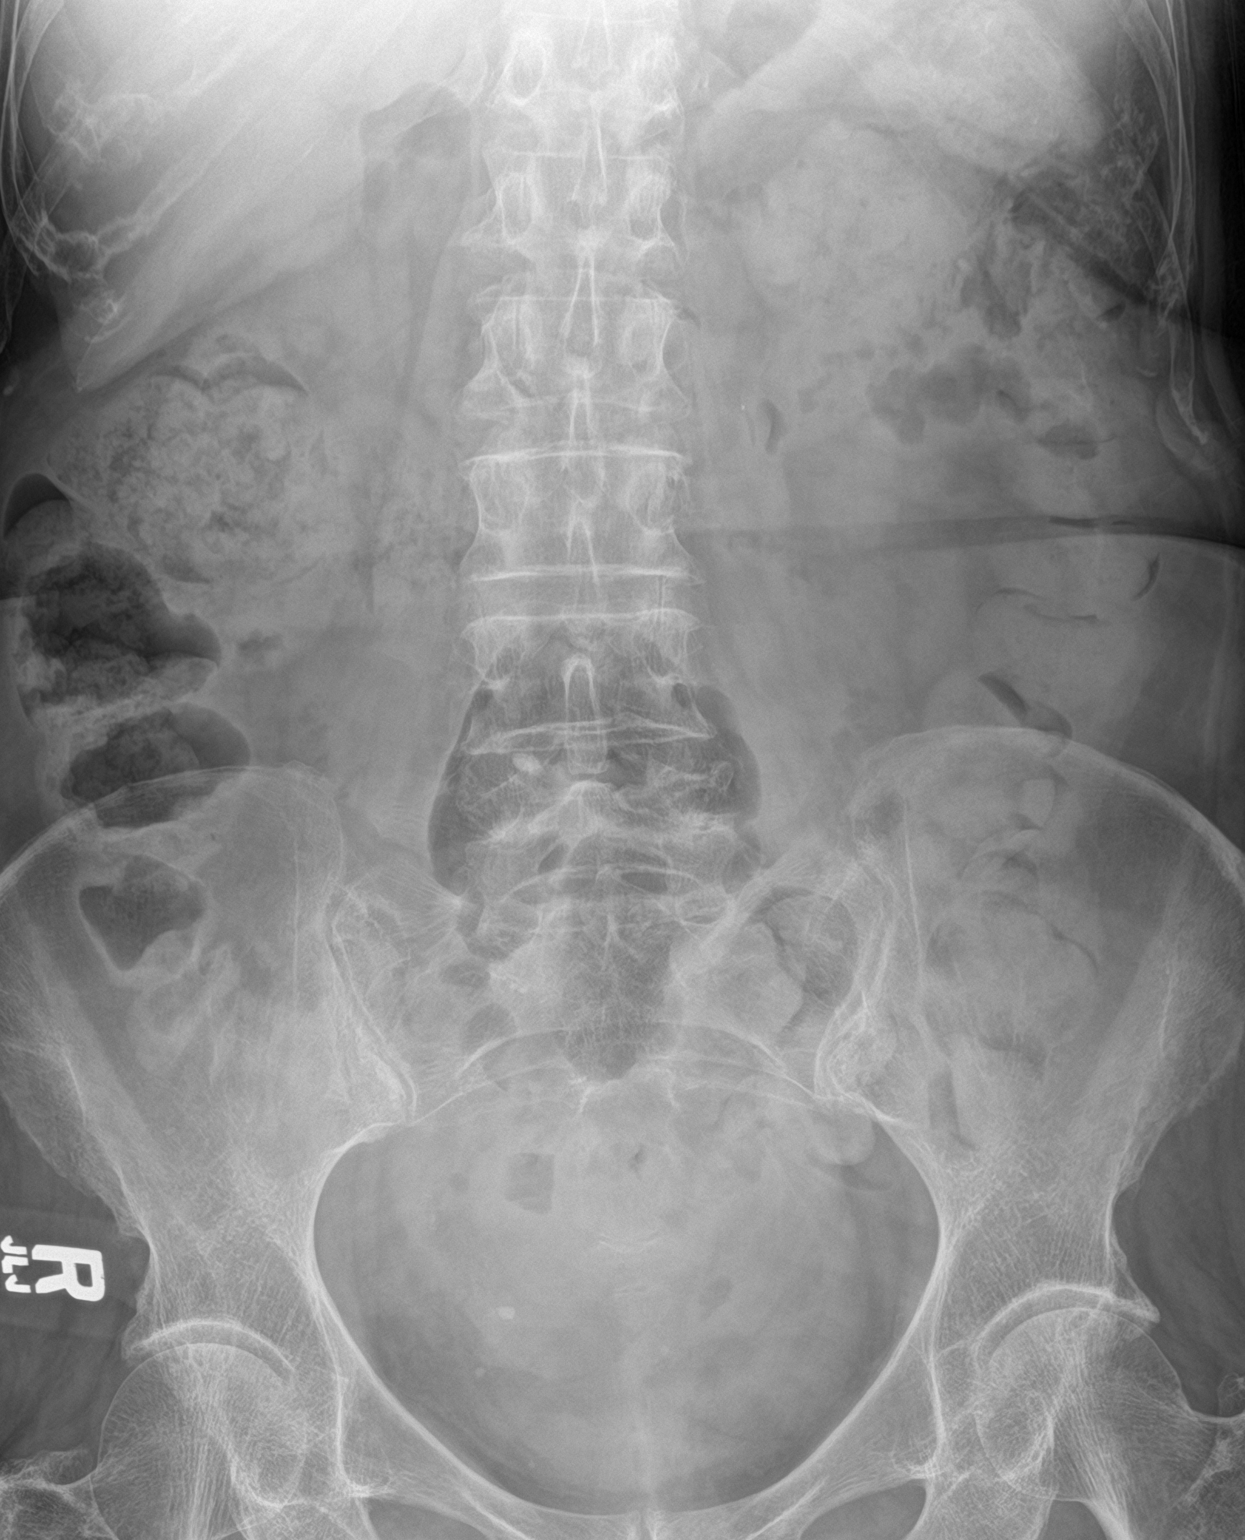

[2 of 2 positions shown; findings below may reference images not displayed]

FINDINGS: Scattered large and small bowel gas is noted. Fecal material is
noted throughout the colon without definitive obstructive change.
This is consistent with constipation and may be related to the
patient's underlying cervical mass and apparent narrowing of the
colon. Degenerative changes of lumbar spine are seen. No other focal
abnormality is noted.
IMPRESSION: Increased fecal burden within the colon likely related to degree of
constipation secondary to the known pelvic mass.

## 2016-11-27 NOTE — Telephone Encounter (Signed)
Central scheduling called that pt wants to move her lab and flush to before her scan. CT scheduled for 10/10.  In basket sent.   Pt c/o hot flashes for the last couple of weeks. They come randomly, they last about 10 minutes. This is the first time hot flashes for the pt. She has also been feeling nervous at the same time.

## 2016-11-27 NOTE — Telephone Encounter (Signed)
No, she probably forgot We have discussed this before; could be due to disease, chemo or possible liver disease Will evaluate with CT as scheduled

## 2016-11-27 NOTE — Telephone Encounter (Signed)
S/w pt per Dr Gorsuch response. 

## 2016-11-28 ENCOUNTER — Telehealth: Payer: Self-pay | Admitting: Hematology and Oncology

## 2016-11-28 NOTE — Telephone Encounter (Signed)
Change appt time per sch message for labs to prior to Ct time. - left message with appt date and time.

## 2016-12-05 ENCOUNTER — Ambulatory Visit (INDEPENDENT_AMBULATORY_CARE_PROVIDER_SITE_OTHER): Payer: Medicare HMO | Admitting: Family Medicine

## 2016-12-05 ENCOUNTER — Telehealth: Payer: Self-pay | Admitting: Family Medicine

## 2016-12-05 ENCOUNTER — Encounter: Payer: Self-pay | Admitting: Family Medicine

## 2016-12-05 VITALS — BP 108/68 | HR 82 | Temp 99.0°F | Ht 64.5 in | Wt 172.5 lb

## 2016-12-05 DIAGNOSIS — Z23 Encounter for immunization: Secondary | ICD-10-CM | POA: Diagnosis not present

## 2016-12-05 DIAGNOSIS — S91331A Puncture wound without foreign body, right foot, initial encounter: Secondary | ICD-10-CM

## 2016-12-05 DIAGNOSIS — L03115 Cellulitis of right lower limb: Secondary | ICD-10-CM

## 2016-12-05 DIAGNOSIS — D849 Immunodeficiency, unspecified: Secondary | ICD-10-CM

## 2016-12-05 DIAGNOSIS — W450XXA Nail entering through skin, initial encounter: Secondary | ICD-10-CM

## 2016-12-05 DIAGNOSIS — T148XXA Other injury of unspecified body region, initial encounter: Secondary | ICD-10-CM

## 2016-12-05 DIAGNOSIS — D899 Disorder involving the immune mechanism, unspecified: Secondary | ICD-10-CM

## 2016-12-05 MED ORDER — AMOXICILLIN-POT CLAVULANATE 875-125 MG PO TABS: 1.0000 | ORAL_TABLET | Freq: Two times a day (BID) | ORAL | 0 refills | Status: DC

## 2016-12-05 NOTE — Progress Notes (Addendum)
   Subjective:    Patient ID: Katherine Boone, female    DOB: 24-Nov-1945, 71 y.o.   MRN: [id number]  HPI   72 year old female presents after stepping on nail with right foot yesterday.   She reports she was doing yard work, step on old wood with nail in t under leaves barefoot.. Entered 1cm central anterior plantar surface of foot Minimal pain. Last night pain started in anterior foot near wound, noted redness and swelling of  Anterior foot. Able to move toes and ankle well.  no discharge, no fever, no flu like symptoms new.Marland Kitchen Has had flushing associated with cancer in last several weeks She soaked in epsom salt water Td 12/10/2011.. Very close to 5 years ago.. Needs booster.  She has history of chemotherapy induced peripheral neuropathy. She has metastatic cervical cancer to lung, pelvis.  CBC looked  Better at recent OV, improved pancytopenia.  ONC is Dr.  Alvy Bimler Chemo treatment.. 2 weeks ago.  Appt next week for repeat CT and ONC visit  Review of Systems  Constitutional: Negative for fatigue and fever.  HENT: Negative for congestion.   Eyes: Negative for pain.  Respiratory: Negative for cough and shortness of breath.   Cardiovascular: Negative for chest pain, palpitations and leg swelling.  Gastrointestinal: Negative for abdominal pain.  Genitourinary: Negative for dysuria and vaginal bleeding.  Musculoskeletal: Negative for back pain.  Neurological: Negative for syncope, light-headedness and headaches.  Psychiatric/Behavioral: Negative for dysphoric mood.       Objective:   Physical Exam  Constitutional: Vital signs are normal. She appears well-developed and well-nourished. She is cooperative.  Non-toxic appearance. She does not appear ill. No distress.  HENT:  Head: Normocephalic.  Right Ear: Hearing, tympanic membrane, external ear and ear canal normal. Tympanic membrane is not erythematous, not retracted and not bulging.  Left Ear: Hearing, tympanic membrane, external  ear and ear canal normal. Tympanic membrane is not erythematous, not retracted and not bulging.  Nose: No mucosal edema or rhinorrhea. Right sinus exhibits no maxillary sinus tenderness and no frontal sinus tenderness. Left sinus exhibits no maxillary sinus tenderness and no frontal sinus tenderness.  Mouth/Throat: Uvula is midline, oropharynx is clear and moist and mucous membranes are normal.  Eyes: Pupils are equal, round, and reactive to light. Conjunctivae, EOM and lids are normal. Lids are everted and swept, no foreign bodies found.  Neck: Trachea normal and normal range of motion. Neck supple. Carotid bruit is not present. No thyroid mass and no thyromegaly present.  Cardiovascular: Normal rate, regular rhythm, S1 normal, S2 normal, normal heart sounds, intact distal pulses and normal pulses.  Exam reveals no gallop and no friction rub.   No murmur heard. Pulmonary/Chest: Effort normal and breath sounds normal. No tachypnea. No respiratory distress. She has no decreased breath sounds. She has no wheezes. She has no rhonchi. She has no rales.  Abdominal: Soft. Normal appearance and bowel sounds are normal. There is no tenderness.  Neurological: She is alert.  Skin: Skin is warm, dry and intact. No rash noted.  Right foot with swelling and erythema, increased warmth, puncture on sole of foot below 3rd metatarsal  Psychiatric: Her speech is normal and behavior is normal. Judgment and thought content normal. Her mood appears not anxious. Cognition and memory are normal. She does not exhibit a depressed mood.          Assessment & Plan:

## 2016-12-05 NOTE — Telephone Encounter (Signed)
Patient Name: Katherine Boone DOB: 1946-02-16 Initial Comment Caller stepped on a nail, swollen, needs to know if she needs a tenuous shot Nurse Assessment Nurse: Neena Rhymes, RN, Sharyn Lull Date/Time (Eastern Time): 12/05/2016 8:56:10 AM Confirm and document reason for call. If symptomatic, describe symptoms. ---Stepped on nail yesterday and has puncture wound middle bottom towards her toes. She states it is a little red, a little warm to touch. no discharge. Denies fever. Last Tetanus booster unknown. Does the patient have any new or worsening symptoms? ---Yes Will a triage be completed? ---Yes Related visit to physician within the last 2 weeks? ---No Does the PT have any chronic conditions? (i.e. diabetes, asthma, etc.) ---Yes List chronic conditions. ---On Chemo treatment for either ovarian or cervical cancer. Is this a behavioral health or substance abuse call? ---No Guidelines Guideline Title Affirmed Question Affirmed Notes Puncture Wound Puncture wound from a sharp object that was very dirty Final Disposition User See Physician within 4 Hours (or PCP triage) Neena Rhymes, RN, Sharyn Lull Comments No appt available in recommended time frame with PCP. Appt scheduled for today at 12:30 with Dr. Diona Browner Referrals REFERRED TO PCP OFFICE Caller Disagree/Comply Comply Caller Understands Yes PreDisposition Call Doctor

## 2016-12-05 NOTE — Patient Instructions (Addendum)
Complete course of Augmentin x 10 days,.  Call or go to ER if increasing pain, spreading redness in , discharge or new fever.  Keep follow up with Oncology next week.

## 2016-12-05 NOTE — Telephone Encounter (Signed)
Pt has appt with Dr Diona Browner 12/05/16 at 12:30.

## 2016-12-11 DIAGNOSIS — D849 Immunodeficiency, unspecified: Secondary | ICD-10-CM | POA: Insufficient documentation

## 2016-12-11 DIAGNOSIS — L02619 Cutaneous abscess of unspecified foot: Secondary | ICD-10-CM | POA: Insufficient documentation

## 2016-12-11 DIAGNOSIS — D899 Disorder involving the immune mechanism, unspecified: Secondary | ICD-10-CM

## 2016-12-11 DIAGNOSIS — L03119 Cellulitis of unspecified part of limb: Secondary | ICD-10-CM

## 2016-12-11 NOTE — Assessment & Plan Note (Signed)
Imunocompromised with  With DM and neuropathy.  Treat with broad antibitoics. Consulted ONC for coverage.  Onc will follow up on wound at upcoming appt.

## 2016-12-12 ENCOUNTER — Ambulatory Visit: Payer: Medicare HMO

## 2016-12-12 ENCOUNTER — Ambulatory Visit (HOSPITAL_COMMUNITY)
Admission: RE | Admit: 2016-12-12 | Discharge: 2016-12-12 | Disposition: A | Discharge status: Home / Self Care | Payer: Medicare HMO | Source: Ambulatory Visit | Attending: Hematology and Oncology | Admitting: Hematology and Oncology

## 2016-12-12 ENCOUNTER — Encounter (HOSPITAL_COMMUNITY): Payer: Self-pay

## 2016-12-12 ENCOUNTER — Telehealth: Payer: Self-pay | Admitting: *Deleted

## 2016-12-12 ENCOUNTER — Other Ambulatory Visit: Payer: Medicare HMO

## 2016-12-12 ENCOUNTER — Other Ambulatory Visit (HOSPITAL_BASED_OUTPATIENT_CLINIC_OR_DEPARTMENT_OTHER): Payer: Medicare HMO

## 2016-12-12 DIAGNOSIS — C7802 Secondary malignant neoplasm of left lung: Secondary | ICD-10-CM

## 2016-12-12 DIAGNOSIS — C787 Secondary malignant neoplasm of liver and intrahepatic bile duct: Secondary | ICD-10-CM

## 2016-12-12 DIAGNOSIS — I700 Atherosclerosis of aorta: Secondary | ICD-10-CM | POA: Insufficient documentation

## 2016-12-12 DIAGNOSIS — C7801 Secondary malignant neoplasm of right lung: Secondary | ICD-10-CM

## 2016-12-12 DIAGNOSIS — R109 Unspecified abdominal pain: Secondary | ICD-10-CM | POA: Diagnosis not present

## 2016-12-12 DIAGNOSIS — X58XXXA Exposure to other specified factors, initial encounter: Secondary | ICD-10-CM | POA: Diagnosis not present

## 2016-12-12 DIAGNOSIS — S22000A Wedge compression fracture of unspecified thoracic vertebra, initial encounter for closed fracture: Secondary | ICD-10-CM | POA: Diagnosis not present

## 2016-12-12 DIAGNOSIS — C799 Secondary malignant neoplasm of unspecified site: Secondary | ICD-10-CM

## 2016-12-12 DIAGNOSIS — C539 Malignant neoplasm of cervix uteri, unspecified: Secondary | ICD-10-CM

## 2016-12-12 DIAGNOSIS — R918 Other nonspecific abnormal finding of lung field: Secondary | ICD-10-CM | POA: Diagnosis not present

## 2016-12-12 LAB — COMPREHENSIVE METABOLIC PANEL
ALT: 10 U/L (ref 0–55)
ANION GAP: 8 meq/L (ref 3–11)
AST: 15 U/L (ref 5–34)
Albumin: 3.6 g/dL (ref 3.5–5.0)
Alkaline Phosphatase: 97 U/L (ref 40–150)
BILIRUBIN TOTAL: 0.34 mg/dL (ref 0.20–1.20)
BUN: 13.8 mg/dL (ref 7.0–26.0)
CHLORIDE: 110 meq/L — AB (ref 98–109)
CO2: 24 meq/L (ref 22–29)
Calcium: 9.3 mg/dL (ref 8.4–10.4)
Creatinine: 1.1 mg/dL (ref 0.6–1.1)
EGFR: 50 mL/min/{1.73_m2} — AB (ref >60)
GLUCOSE: 93 mg/dL (ref 70–140)
Potassium: 4.5 mEq/L (ref 3.5–5.1)
SODIUM: 142 meq/L (ref 136–145)
TOTAL PROTEIN: 6.9 g/dL (ref 6.4–8.3)

## 2016-12-12 LAB — CBC WITH DIFFERENTIAL/PLATELET
BASO%: 0.8 % (ref 0.0–2.0)
BASOS ABS: 0 10*3/uL (ref 0.0–0.1)
EOS ABS: 0.1 10*3/uL (ref 0.0–0.5)
EOS%: 3.8 % (ref 0.0–7.0)
HCT: 30.5 % — ABNORMAL LOW (ref 34.8–46.6)
HGB: 10.2 g/dL — ABNORMAL LOW (ref 11.6–15.9)
LYMPH%: 54.9 % — AB (ref 14.0–49.7)
MCH: 30.2 pg (ref 25.1–34.0)
MCHC: 33.3 g/dL (ref 31.5–36.0)
MCV: 90.8 fL (ref 79.5–101.0)
MONO#: 0.4 10*3/uL (ref 0.1–0.9)
MONO%: 14.9 % — AB (ref 0.0–14.0)
NEUT%: 25.6 % — AB (ref 38.4–76.8)
NEUTROS ABS: 0.7 10*3/uL — AB (ref 1.5–6.5)
PLATELETS: 219 10*3/uL (ref 145–400)
RBC: 3.36 10*6/uL — AB (ref 3.70–5.45)
RDW: 15.4 % — ABNORMAL HIGH (ref 11.2–14.5)
WBC: 2.9 10*3/uL — AB (ref 3.9–10.3)
lymph#: 1.6 10*3/uL (ref 0.9–3.3)

## 2016-12-12 LAB — MAGNESIUM: Magnesium: 1.7 mg/dl (ref 1.5–2.5)

## 2016-12-12 MED ORDER — IOPAMIDOL (ISOVUE-300) INJECTION 61%
100.0000 mL | Freq: Once | INTRAVENOUS | Status: AC | PRN
  Administered 2016-12-12: 100 mL via INTRAVENOUS

## 2016-12-12 MED ORDER — IOPAMIDOL (ISOVUE-300) INJECTION 61%
INTRAVENOUS | Status: AC
Start: 2016-12-12 — End: 2016-12-12
  Administered 2016-12-12: 100 mL via INTRAVENOUS
  Filled 2016-12-12: qty 100

## 2016-12-12 MED ORDER — SODIUM CHLORIDE 0.9% FLUSH
10.0000 mL | Freq: Once | INTRAVENOUS | Status: DC
  Filled 2016-12-12: qty 10

## 2016-12-12 NOTE — Telephone Encounter (Signed)
-----  Message from Heath Lark, MD sent at 12/12/2016 11:14 AM EDT ----- Regarding: call pathologist to add PDL-1 testing Accession: [id number]  Date of biopsy was March 2017

## 2016-12-12 NOTE — Progress Notes (Signed)
Patient had labs drawn peripherally. No flush appt needed.

## 2016-12-12 NOTE — Telephone Encounter (Signed)
Pathology notified of message below.  

## 2016-12-13 ENCOUNTER — Telehealth: Payer: Self-pay | Admitting: Hematology and Oncology

## 2016-12-13 ENCOUNTER — Encounter: Payer: Self-pay | Admitting: Hematology and Oncology

## 2016-12-13 ENCOUNTER — Ambulatory Visit: Payer: Medicare HMO

## 2016-12-13 ENCOUNTER — Ambulatory Visit (HOSPITAL_BASED_OUTPATIENT_CLINIC_OR_DEPARTMENT_OTHER): Payer: Medicare HMO | Admitting: Hematology and Oncology

## 2016-12-13 VITALS — BP 179/70 | HR 72 | Temp 98.4°F | Resp 20 | Ht 64.5 in | Wt 174.3 lb

## 2016-12-13 DIAGNOSIS — C539 Malignant neoplasm of cervix uteri, unspecified: Secondary | ICD-10-CM

## 2016-12-13 DIAGNOSIS — C787 Secondary malignant neoplasm of liver and intrahepatic bile duct: Secondary | ICD-10-CM | POA: Diagnosis not present

## 2016-12-13 DIAGNOSIS — C7802 Secondary malignant neoplasm of left lung: Secondary | ICD-10-CM | POA: Diagnosis not present

## 2016-12-13 DIAGNOSIS — D61810 Antineoplastic chemotherapy induced pancytopenia: Secondary | ICD-10-CM

## 2016-12-13 DIAGNOSIS — T451X5A Adverse effect of antineoplastic and immunosuppressive drugs, initial encounter: Secondary | ICD-10-CM

## 2016-12-13 DIAGNOSIS — C7801 Secondary malignant neoplasm of right lung: Secondary | ICD-10-CM

## 2016-12-13 DIAGNOSIS — G620 Drug-induced polyneuropathy: Secondary | ICD-10-CM | POA: Diagnosis not present

## 2016-12-13 DIAGNOSIS — D61818 Other pancytopenia: Secondary | ICD-10-CM

## 2016-12-13 NOTE — Progress Notes (Signed)
[place] OFFICE PROGRESS NOTE  Patient Care Team: [name], MD as PCP - General (Family Medicine) Gordy Levan, MD as Consulting Physician (Oncology) Carmel Sacramento, OD as Consulting Physician (Optometry)  SUMMARY OF ONCOLOGIC HISTORY: Oncology History   PD-L 1 testing neg     Cervical cancer ([place])   04/26/2015 Imaging    Numerous bilateral pulmonary nodules consistent with metastatic disease are associated with liver lesions, omental nodules, mesenteric nodules, necrotic retroperitoneal lymphadenopathy in a mixed cystic and solid mass in the central pelvis involving the uterus extending into the cul-de-sac and both adnexal regions. Uterine primary (likely endometrial) is favored over an ovarian primary malignancy given the lack of ascites, associated intrahepatic metastases and pulmonary involvement.      05/04/2015 Procedure    Technically successful CT-guided core biopsy of left pelvic mass      05/04/2015 Pathology Results    Soft Tissue Needle Core Biopsy, left ext iliac mass METASTATIC HIGH GRADE POORLY DIFFERENTIATED CARCINOMA CONSISTENT WITH CERVICAL ORIGIN Microscopic Comment The neoplasm stains positive for cervical markers : p16, ER, ck8/18, and p63 (focal), TTF-1 (focal) and negative for synaptophysin, Chromogranin (neuroendocrine markers) and ck5/6. The morphology and immunohistochemistry staining pattern support these cells are GYN cervical origin.Spec      05/10/2015 Imaging    Innumerable (> than 40) pulmonary nodules randomly distributed throughout both lungs, largest 1.1 cm, in keeping with pulmonary metastases. 2. Left supraclavicular and left hilar nodal metastases. 3. Re- demonstration of liver metastases. 4. Right thyroid lobe 1.4 cm pulmonary nodule, for which no further evaluation is recommended. 5. Stable mild superior T11 vertebral compression deformity.      05/12/2015 - 05/12/2015 Chemotherapy    She received 1 dose of carbo/taxol  only      05/15/2015 Imaging    Widely metastatic cervical carcinoma with unchanged appearance compared to 04/26/2015. Bulky tumor narrows and likely invades the rectum/distal sigmoid with progressive proximal stool retention      05/15/2015 - 05/23/2015 Hospital Admission    She was admitted for severe bowel obstruction requiring urgent diversion colostomy      05/18/2015 Surgery    She underwent LAPAROSCOPIC DIVERTING SIGMOID LOOP COLOSTOMY for bowel obstruction       06/10/2015 Procedure    Successful placement of a right internal jugular approach power injectable Port-A-Cath. The catheter is ready for immediate use      06/23/2015 - 03/01/2016 Chemotherapy    She received combination treatment with cisplatin, Taxol and Avastin. Avastin was started on 5/11. Dose of Taxol was modified due to neuropathy and cisplatin was omited last dose due to 'weakness'      09/13/2015 Imaging    Significant interval treatment response. Pulmonary and liver metastases have significantly decreased in size. Thoracic adenopathy has decreased in size. Mixed attenuation mass encompassing the uterus and bilateral adnexal regions has significantly decreased in size. No residual visualized peritoneal tumor implants. No new or progressive metastatic disease. 2. No evidence of bowel obstruction or acute bowel inflammation status post diverting sigmoid colostomy. 3. Additional findings include mild aortic atherosclerosis and Stable mild chronic superior T12 compression fracture.      12/14/2015 Imaging    Stable to improved interval exam. Multiple bilateral pulmonary nodules are stable in size to decreased and the liver lesions are stable to decreased. Mixed attenuation lesion encompassing the uterus and adnexal region shows decrease in size. There is no evidence for new or progressive disease on today's exam.  05/15/2016 Imaging    Findings today reflecting mixed interval response to therapy. 2. There has been  mild increase in size of pulmonary nodules. 3. Interval increase in size of pelvic mass 4. Further regression of liver metastases 5. There is a new soft tissue nodule within the right lower quadrant peritoneal cavity. Suspicious peritoneal disease      06/07/2016 -  Chemotherapy    She received chemo with cisplatin and gemzar      06/14/2016 Adverse Reaction    Cycle 1, day 8 is put on hold due to severe pancytopenia      08/29/2016 Imaging    Ct scan of chest, abdomen and pelvis: 1. General improvement, with reduced size of the scattered pulmonary nodules, and also a much more normal contour and appearance of the uterus, without the visible enhancing irregular mass shown on the 05/15/2016 exam involving the uterus and cervix. There still some mild soft tissue prominence of the cervix, and a cystic lesion of the left ovary as noted above. No pathologic adenopathy. 2. The right lower quadrant omental nodule has considerably improved and nearly resolved. 3. The prior hepatic metastatic lesions have essentially resolved, with only minimal residual linear hypodensity in segment 4 at the site of the prior large metastatic lesion shown on 05/15/2015.  4. Other imaging findings of potential clinical significance: Left lower quadrant colostomy. Chronic superior endplate compression at T11. Bony demineralization. Trace free pelvic fluid in the cul-de-sac. Central disc protrusion at L3-4. Left mid kidney hypodense lesion is likely a cyst.      12/12/2016 Imaging    1. Suspected minimal enlargement of at least 1 pulmonary nodule. The majority of pulmonary nodules are similar. Cannot exclude minimal pulmonary metastasis progression. 2. No evidence of extrathoracic progressive metastatic disease. Similar soft tissue fullness within the uterine cervix and similar to slight decrease in size of a tiny omental nodule. 3. Aortic Atherosclerosis (ICD10-I70.0). 4. Chronic T10 compression deformity        INTERVAL HISTORY: Please see below for problem oriented charting. She returns for further follow-up She feels well She is completing antibiotic treatment due to recent foot injury She denies recent cough, chest pain or shortness of breath She had mild residual peripheral neuropathy from treatment, but not debilitating No mucositis, nausea or vomiting  REVIEW OF SYSTEMS:   Constitutional: Denies fevers, chills or abnormal weight loss Eyes: Denies blurriness of vision Ears, nose, mouth, throat, and face: Denies mucositis or sore throat Respiratory: Denies cough, dyspnea or wheezes Cardiovascular: Denies palpitation, chest discomfort or lower extremity swelling Gastrointestinal:  Denies nausea, heartburn or change in bowel habits Skin: Denies abnormal skin rashes Lymphatics: Denies new lymphadenopathy or easy bruising Behavioral/Psych: Mood is stable, no new changes  All other systems were reviewed with the patient and are negative.  I have reviewed the past medical history, past surgical history, social history and family history with the patient and they are unchanged from previous note.  ALLERGIES:  has No Known Allergies.  MEDICATIONS:  Current Outpatient Prescriptions  Medication Sig Dispense Refill  . cholecalciferol (VITAMIN D) 1000 units tablet Take 1,000 Units by mouth daily.    Marland Kitchen omeprazole (PRILOSEC) 20 MG capsule TAKE 1 CAPSULE BY MOUTH EVERY DAY 90 capsule 0  . temazepam (RESTORIL) 15 MG capsule Take 15 mg by mouth at bedtime as needed for sleep.     No current facility-administered medications for this visit.    Facility-Administered Medications Ordered in Other Visits  Medication Dose Route Frequency  Provider Last Rate Last Dose  . heparin lock flush 100 unit/mL  500 Units Intravenous Once Livesay, Lennis P, MD      . sodium chloride flush (NS) 0.9 % injection 10 mL  10 mL Intravenous PRN Livesay, Lennis P, MD        PHYSICAL EXAMINATION: ECOG PERFORMANCE  STATUS: 1 - Symptomatic but completely ambulatory  Vitals:   12/13/16 0829  BP: (!) 179/70  Pulse: 72  Resp: 20  Temp: 98.4 F (36.9 C)  SpO2: 99%   Filed Weights   12/13/16 0829  Weight: 174 lb 4.8 oz (79.1 kg)    GENERAL:alert, no distress and comfortable SKIN: skin color, texture, turgor are normal, no rashes or significant lesions EYES: normal, Conjunctiva are pink and non-injected, sclera clear OROPHARYNX:no exudate, no erythema and lips, buccal mucosa, and tongue normal  NECK: supple, thyroid normal size, non-tender, without nodularity LYMPH:  no palpable lymphadenopathy in the cervical, axillary or inguinal LUNGS: clear to auscultation and percussion with normal breathing effort HEART: regular rate & rhythm and no murmurs and no lower extremity edema ABDOMEN:abdomen soft, non-tender and normal bowel sounds Musculoskeletal:no cyanosis of digits and no clubbing  NEURO: alert & oriented x 3 with fluent speech, no focal motor/sensory deficits  LABORATORY DATA:  I have reviewed the data as listed    Component Value Date/Time   NA 142 12/12/2016 0745   K 4.5 12/12/2016 0745   CL 109 05/22/2015 0522   CO2 24 12/12/2016 0745   GLUCOSE 93 12/12/2016 0745   BUN 13.8 12/12/2016 0745   CREATININE 1.1 12/12/2016 0745   CALCIUM 9.3 12/12/2016 0745   PROT 6.9 12/12/2016 0745   ALBUMIN 3.6 12/12/2016 0745   AST 15 12/12/2016 0745   ALT 10 12/12/2016 0745   ALKPHOS 97 12/12/2016 0745   BILITOT 0.34 12/12/2016 0745   GFRNONAA >60 05/22/2015 0522   GFRAA >60 05/22/2015 0522    No results found for: SPEP, UPEP  Lab Results  Component Value Date   WBC 2.9 (L) 12/12/2016   NEUTROABS 0.7 (L) 12/12/2016   HGB 10.2 (L) 12/12/2016   HCT 30.5 (L) 12/12/2016   MCV 90.8 12/12/2016   PLT 219 12/12/2016      Chemistry      Component Value Date/Time   NA 142 12/12/2016 0745   K 4.5 12/12/2016 0745   CL 109 05/22/2015 0522   CO2 24 12/12/2016 0745   BUN 13.8 12/12/2016 0745    CREATININE 1.1 12/12/2016 0745      Component Value Date/Time   CALCIUM 9.3 12/12/2016 0745   ALKPHOS 97 12/12/2016 0745   AST 15 12/12/2016 0745   ALT 10 12/12/2016 0745   BILITOT 0.34 12/12/2016 0745       RADIOGRAPHIC STUDIES: I have personally reviewed the radiological images as listed and agreed with the findings in the report. Ct Chest W Contrast  Result Date: 12/12/2016 CLINICAL DATA:  Metastatic cervical cancer to lung and liver diagnosed in March. chemotherapy in progress. Colostomy secondary colon resection. EXAM: CT CHEST, ABDOMEN, AND PELVIS WITH CONTRAST TECHNIQUE: Multidetector CT imaging of the chest, abdomen and pelvis was performed following the standard protocol during bolus administration of intravenous contrast. CONTRAST:  100 cc of Isovue 300 COMPARISON:  08/29/2016 FINDINGS: CT CHEST FINDINGS Cardiovascular: Right-sided Port-A-Cath which terminates at low SVC. Aortic atherosclerosis. Normal heart size, without pericardial effusion. No central pulmonary embolism, on this non-dedicated study. Mediastinum/Nodes: Low left jugular node is similar at 6 mm and  not pathologic by size criteria. No mediastinal or hilar adenopathy. Lungs/Pleura: No pleural fluid. Small pulmonary nodules are identified on series 4. The majority are similar in size. A right lower lobe 5 mm nodule on image 87/series 4 is enlarged from approximately 3 mm on the prior exam. Index left lower lobe 5 mm nodule on image 106/series 4 is not significantly changed. A left lower lobe nodule measures 6 mm on image 112/series 4 today versus 4 mm on the prior. 3 mm left lower lobe subpleural nodule on image 63/series 2 may have enlarged from 2 mm previously. A subpleural left upper lobe 6 mm nodule on image 47/series 4 measured 4 mm on the prior. Musculoskeletal: Osteopenia. Mild T10 compression deformity involves the superior endplate and is not significantly changed. No ventral canal encroachment. CT ABDOMEN PELVIS  FINDINGS Hepatobiliary: No residual or recurrent suspicious liver lesions. Normal gallbladder, without biliary ductal dilatation. Pancreas: Normal, without mass or ductal dilatation. Spleen: Normal in size, without focal abnormality. Adrenals/Urinary Tract: Normal adrenal glands. Left renal lesion measures 1.2 cm and 19 HU, favoring a cyst or minimally complex cyst. Normal right kidney, without hydronephrosis. Normal urinary bladder. Stomach/Bowel: Gastric antral underdistention. Left lower quadrant colostomy. Normal terminal ileum. Normal small bowel. Vascular/Lymphatic: Aortic atherosclerosis. No abdominopelvic adenopathy. Reproductive: Mild soft tissue fullness within the cervix is similar, including on image 117/series 2. No well-defined mass. No adnexal mass. Other: No significant free fluid. Mild pelvic floor laxity. A 3-4 mm omental nodule on image 90/series 2 is similar to slightly decreased in size compared to the prior. Musculoskeletal: Osteopenia.  Prominent disc bulge at L3-4. IMPRESSION: 1. Suspected minimal enlargement of at least 1 pulmonary nodule. The majority of pulmonary nodules are similar. Cannot exclude minimal pulmonary metastasis progression. 2. No evidence of extrathoracic progressive metastatic disease. Similar soft tissue fullness within the uterine cervix and similar to slight decrease in size of a tiny omental nodule. 3.  Aortic Atherosclerosis (ICD10-I70.0). 4. Chronic T10 compression deformity. Electronically Signed   By: Abigail Miyamoto M.D.   On: 12/12/2016 10:17   Ct Abdomen Pelvis W Contrast  Result Date: 12/12/2016 CLINICAL DATA:  Metastatic cervical cancer to lung and liver diagnosed in March. chemotherapy in progress. Colostomy secondary colon resection. EXAM: CT CHEST, ABDOMEN, AND PELVIS WITH CONTRAST TECHNIQUE: Multidetector CT imaging of the chest, abdomen and pelvis was performed following the standard protocol during bolus administration of intravenous contrast.  CONTRAST:  100 cc of Isovue 300 COMPARISON:  08/29/2016 FINDINGS: CT CHEST FINDINGS Cardiovascular: Right-sided Port-A-Cath which terminates at low SVC. Aortic atherosclerosis. Normal heart size, without pericardial effusion. No central pulmonary embolism, on this non-dedicated study. Mediastinum/Nodes: Low left jugular node is similar at 6 mm and not pathologic by size criteria. No mediastinal or hilar adenopathy. Lungs/Pleura: No pleural fluid. Small pulmonary nodules are identified on series 4. The majority are similar in size. A right lower lobe 5 mm nodule on image 87/series 4 is enlarged from approximately 3 mm on the prior exam. Index left lower lobe 5 mm nodule on image 106/series 4 is not significantly changed. A left lower lobe nodule measures 6 mm on image 112/series 4 today versus 4 mm on the prior. 3 mm left lower lobe subpleural nodule on image 63/series 2 may have enlarged from 2 mm previously. A subpleural left upper lobe 6 mm nodule on image 47/series 4 measured 4 mm on the prior. Musculoskeletal: Osteopenia. Mild T10 compression deformity involves the superior endplate and is not significantly changed. No  ventral canal encroachment. CT ABDOMEN PELVIS FINDINGS Hepatobiliary: No residual or recurrent suspicious liver lesions. Normal gallbladder, without biliary ductal dilatation. Pancreas: Normal, without mass or ductal dilatation. Spleen: Normal in size, without focal abnormality. Adrenals/Urinary Tract: Normal adrenal glands. Left renal lesion measures 1.2 cm and 19 HU, favoring a cyst or minimally complex cyst. Normal right kidney, without hydronephrosis. Normal urinary bladder. Stomach/Bowel: Gastric antral underdistention. Left lower quadrant colostomy. Normal terminal ileum. Normal small bowel. Vascular/Lymphatic: Aortic atherosclerosis. No abdominopelvic adenopathy. Reproductive: Mild soft tissue fullness within the cervix is similar, including on image 117/series 2. No well-defined mass. No  adnexal mass. Other: No significant free fluid. Mild pelvic floor laxity. A 3-4 mm omental nodule on image 90/series 2 is similar to slightly decreased in size compared to the prior. Musculoskeletal: Osteopenia.  Prominent disc bulge at L3-4. IMPRESSION: 1. Suspected minimal enlargement of at least 1 pulmonary nodule. The majority of pulmonary nodules are similar. Cannot exclude minimal pulmonary metastasis progression. 2. No evidence of extrathoracic progressive metastatic disease. Similar soft tissue fullness within the uterine cervix and similar to slight decrease in size of a tiny omental nodule. 3.  Aortic Atherosclerosis (ICD10-I70.0). 4. Chronic T10 compression deformity. Electronically Signed   By: Abigail Miyamoto M.D.   On: 12/12/2016 10:17    ASSESSMENT & PLAN:  Cervical cancer ([place]) She has remarkable response to treatment She had very small  amount of disease burden on CT imaging She is not symptomatic She has experienced significant side effects of treatment I recommend chemotherapy holiday I plan to bring her in every 6-8 weeks to maintain port patency and plan to restage with imaging study in 3 months She agreed with the plan of care  Chemotherapy-induced peripheral neuropathy ([place]) She has trace peripheral neuropathy from prior treatment. It is mild, grade 1 only Observe for now  Malignant neoplasm metastatic to left lung [facility]) She has persistent bilateral lung nodules but they are very small Recommend observation only She is not symptomatic  Metastases to the liver [facility]) So far, her liver enzymes are stable Recommend observation only Imaging studies showed no detectable liver masses  Pancytopenia, acquired [facility]) She has mild pancytopenia from recent treatment She is not symptomatic We discussed neutropenic precaution   Orders Placed This Encounter  Procedures  . CT ABDOMEN PELVIS W CONTRAST    Standing Status:   Future    Standing Expiration Date:   12/13/2017     Order Specific Question:   If indicated for the ordered procedure, I authorize the administration of contrast media per Radiology protocol    Answer:   Yes    Order Specific Question:   Preferred imaging location?    Answer:   [facility]    Order Specific Question:   Radiology Contrast Protocol - do NOT remove file path    Answer:   \\[email or web address]  . CT CHEST W CONTRAST    Standing Status:   Future    Standing Expiration Date:   12/13/2017    Order Specific Question:   If indicated for the ordered procedure, I authorize the administration of contrast media per Radiology protocol    Answer:   Yes    Order Specific Question:   Preferred imaging location?    Answer:   [facility]    Order Specific Question:   Radiology Contrast Protocol - do NOT remove file path    Answer:   \\[email or web address]  . Comprehensive metabolic panel    Standing Status:  Standing    Number of Occurrences:   9    Standing Expiration Date:   12/13/2017  . CBC with Differential/Platelet    Standing Status:   Standing    Number of Occurrences:   9    Standing Expiration Date:   12/13/2017   All questions were answered. The patient knows to call the clinic with any problems, questions or concerns. No barriers to learning was detected. I spent 25 minutes counseling the patient face to face. The total time spent in the appointment was 30 minutes and more than 50% was on counseling and review of test results     Heath Lark, MD 12/13/2016 1:47 PM

## 2016-12-13 NOTE — Assessment & Plan Note (Signed)
So far, her liver enzymes are stable Recommend observation only Imaging studies showed no detectable liver masses

## 2016-12-13 NOTE — Telephone Encounter (Signed)
Gave avs and calendar for January 2019 °

## 2016-12-13 NOTE — Assessment & Plan Note (Signed)
She has trace peripheral neuropathy from prior treatment. It is mild, grade 1 only Observe for now

## 2016-12-13 NOTE — Assessment & Plan Note (Signed)
She has remarkable response to treatment She had very small  amount of disease burden on CT imaging She is not symptomatic She has experienced significant side effects of treatment I recommend chemotherapy holiday I plan to bring her in every 6-8 weeks to maintain port patency and plan to restage with imaging study in 3 months She agreed with the plan of care

## 2016-12-13 NOTE — Assessment & Plan Note (Signed)
She has persistent bilateral lung nodules but they are very small Recommend observation only She is not symptomatic

## 2016-12-13 NOTE — Assessment & Plan Note (Signed)
She has mild pancytopenia from recent treatment She is not symptomatic We discussed neutropenic precaution

## 2016-12-18 DIAGNOSIS — Z933 Colostomy status: Secondary | ICD-10-CM | POA: Diagnosis not present

## 2016-12-18 DIAGNOSIS — Z85038 Personal history of other malignant neoplasm of large intestine: Secondary | ICD-10-CM | POA: Diagnosis not present

## 2017-01-02 ENCOUNTER — Other Ambulatory Visit: Payer: Self-pay | Admitting: Dermatology

## 2017-01-02 DIAGNOSIS — C44311 Basal cell carcinoma of skin of nose: Secondary | ICD-10-CM | POA: Diagnosis not present

## 2017-02-18 DIAGNOSIS — L905 Scar conditions and fibrosis of skin: Secondary | ICD-10-CM | POA: Diagnosis not present

## 2017-02-18 DIAGNOSIS — L570 Actinic keratosis: Secondary | ICD-10-CM | POA: Diagnosis not present

## 2017-02-18 DIAGNOSIS — Z85828 Personal history of other malignant neoplasm of skin: Secondary | ICD-10-CM | POA: Diagnosis not present

## 2017-03-11 ENCOUNTER — Telehealth: Payer: Self-pay | Admitting: *Deleted

## 2017-03-11 NOTE — Telephone Encounter (Signed)
-----   Message from Heath Lark, MD sent at 03/11/2017  8:46 AM EST ----- Thanks April  Kaidan Harpster, please call her and remind her of her appointments  ----- Message ----- From: Sheldon Silvan, April H Sent: 03/08/2017   6:07 PM To: Heath Lark, MD  Hello,  We haven't be able to contact the pt to schedule her scans.  April

## 2017-03-11 NOTE — Telephone Encounter (Signed)
Notified of all appts

## 2017-03-13 ENCOUNTER — Other Ambulatory Visit: Payer: Medicare HMO

## 2017-03-13 ENCOUNTER — Inpatient Hospital Stay: Payer: Medicare HMO

## 2017-03-13 ENCOUNTER — Encounter (HOSPITAL_COMMUNITY): Payer: Self-pay

## 2017-03-13 ENCOUNTER — Inpatient Hospital Stay: Payer: Medicare HMO | Attending: Hematology and Oncology

## 2017-03-13 ENCOUNTER — Ambulatory Visit (HOSPITAL_COMMUNITY)
Admission: RE | Admit: 2017-03-13 | Discharge: 2017-03-13 | Disposition: A | Discharge status: Home / Self Care | Payer: Medicare HMO | Source: Ambulatory Visit | Attending: Hematology and Oncology | Admitting: Hematology and Oncology

## 2017-03-13 DIAGNOSIS — D61818 Other pancytopenia: Secondary | ICD-10-CM | POA: Diagnosis not present

## 2017-03-13 DIAGNOSIS — Z66 Do not resuscitate: Secondary | ICD-10-CM | POA: Diagnosis not present

## 2017-03-13 DIAGNOSIS — C787 Secondary malignant neoplasm of liver and intrahepatic bile duct: Secondary | ICD-10-CM | POA: Diagnosis not present

## 2017-03-13 DIAGNOSIS — C7801 Secondary malignant neoplasm of right lung: Secondary | ICD-10-CM | POA: Diagnosis not present

## 2017-03-13 DIAGNOSIS — C7989 Secondary malignant neoplasm of other specified sites: Secondary | ICD-10-CM

## 2017-03-13 DIAGNOSIS — C539 Malignant neoplasm of cervix uteri, unspecified: Secondary | ICD-10-CM

## 2017-03-13 DIAGNOSIS — K7689 Other specified diseases of liver: Secondary | ICD-10-CM | POA: Diagnosis not present

## 2017-03-13 DIAGNOSIS — C799 Secondary malignant neoplasm of unspecified site: Secondary | ICD-10-CM

## 2017-03-13 DIAGNOSIS — K7290 Hepatic failure, unspecified without coma: Secondary | ICD-10-CM | POA: Diagnosis not present

## 2017-03-13 DIAGNOSIS — C786 Secondary malignant neoplasm of retroperitoneum and peritoneum: Secondary | ICD-10-CM | POA: Diagnosis not present

## 2017-03-13 DIAGNOSIS — R911 Solitary pulmonary nodule: Secondary | ICD-10-CM | POA: Diagnosis not present

## 2017-03-13 DIAGNOSIS — C7802 Secondary malignant neoplasm of left lung: Secondary | ICD-10-CM | POA: Insufficient documentation

## 2017-03-13 DIAGNOSIS — Z933 Colostomy status: Secondary | ICD-10-CM | POA: Insufficient documentation

## 2017-03-13 DIAGNOSIS — Z79899 Other long term (current) drug therapy: Secondary | ICD-10-CM | POA: Diagnosis not present

## 2017-03-13 LAB — CBC WITH DIFFERENTIAL/PLATELET
ABS GRANULOCYTE: 3.1 10*3/uL (ref 1.5–6.5)
BASOS ABS: 0 10*3/uL (ref 0.0–0.1)
Basophils Relative: 0 %
Eosinophils Absolute: 0.1 10*3/uL (ref 0.0–0.5)
Eosinophils Relative: 2 %
HEMATOCRIT: 35.2 % (ref 34.8–46.6)
HEMOGLOBIN: 11.6 g/dL (ref 11.6–15.9)
LYMPHS PCT: 34 %
Lymphs Abs: 1.9 10*3/uL (ref 0.9–3.3)
MCH: 28.8 pg (ref 25.1–34.0)
MCHC: 33 g/dL (ref 31.5–36.0)
MCV: 87.3 fL (ref 79.5–101.0)
MONO ABS: 0.5 10*3/uL (ref 0.1–0.9)
Monocytes Relative: 9 %
NEUTROS ABS: 3.1 10*3/uL (ref 1.5–6.5)
Neutrophils Relative %: 55 %
Platelets: 155 10*3/uL (ref 145–400)
RBC: 4.03 MIL/uL (ref 3.70–5.45)
RDW: 13.5 % (ref 11.2–16.1)
WBC: 5.7 10*3/uL (ref 3.9–10.3)

## 2017-03-13 LAB — COMPREHENSIVE METABOLIC PANEL
ALBUMIN: 3.9 g/dL (ref 3.5–5.0)
ALK PHOS: 130 U/L (ref 40–150)
ALT: 12 U/L (ref 0–55)
AST: 19 U/L (ref 5–34)
Anion gap: 9 (ref 3–11)
BILIRUBIN TOTAL: 0.5 mg/dL (ref 0.2–1.2)
BUN: 17 mg/dL (ref 7–26)
CALCIUM: 9.2 mg/dL (ref 8.4–10.4)
CO2: 25 mmol/L (ref 22–29)
CREATININE: 1.32 mg/dL — AB (ref 0.60–1.10)
Chloride: 106 mmol/L (ref 98–109)
GFR calc Af Amer: 46 mL/min — ABNORMAL LOW (ref >60)
GFR, EST NON AFRICAN AMERICAN: 40 mL/min — AB (ref >60)
GLUCOSE: 87 mg/dL (ref 70–140)
Potassium: 4.3 mmol/L (ref 3.3–4.7)
Sodium: 140 mmol/L (ref 136–145)
TOTAL PROTEIN: 7.8 g/dL (ref 6.4–8.3)

## 2017-03-13 MED ORDER — IOPAMIDOL (ISOVUE-300) INJECTION 61%
100.0000 mL | Freq: Once | INTRAVENOUS | Status: AC | PRN
  Administered 2017-03-13: 100 mL via INTRAVENOUS

## 2017-03-13 MED ORDER — IOPAMIDOL (ISOVUE-300) INJECTION 61%
INTRAVENOUS | Status: AC
  Filled 2017-03-13: qty 100

## 2017-03-13 MED ORDER — HEPARIN SOD (PORK) LOCK FLUSH 100 UNIT/ML IV SOLN
500.0000 [IU] | Freq: Once | INTRAVENOUS | Status: AC
  Administered 2017-03-13: 500 [IU] via INTRAVENOUS

## 2017-03-13 MED ORDER — HEPARIN SOD (PORK) LOCK FLUSH 100 UNIT/ML IV SOLN
INTRAVENOUS | Status: AC
  Filled 2017-03-13: qty 5

## 2017-03-13 MED ORDER — SODIUM CHLORIDE 0.9% FLUSH
10.0000 mL | Freq: Once | INTRAVENOUS | Status: AC
  Administered 2017-03-13: 10 mL
  Filled 2017-03-13: qty 10

## 2017-03-14 ENCOUNTER — Telehealth: Payer: Self-pay | Admitting: Hematology and Oncology

## 2017-03-14 ENCOUNTER — Other Ambulatory Visit: Payer: Self-pay | Admitting: Hematology and Oncology

## 2017-03-14 ENCOUNTER — Inpatient Hospital Stay (HOSPITAL_BASED_OUTPATIENT_CLINIC_OR_DEPARTMENT_OTHER): Payer: Medicare HMO | Admitting: Hematology and Oncology

## 2017-03-14 DIAGNOSIS — C787 Secondary malignant neoplasm of liver and intrahepatic bile duct: Secondary | ICD-10-CM | POA: Diagnosis not present

## 2017-03-14 DIAGNOSIS — C786 Secondary malignant neoplasm of retroperitoneum and peritoneum: Secondary | ICD-10-CM

## 2017-03-14 DIAGNOSIS — D61818 Other pancytopenia: Secondary | ICD-10-CM

## 2017-03-14 DIAGNOSIS — R3915 Urgency of urination: Secondary | ICD-10-CM

## 2017-03-14 DIAGNOSIS — G620 Drug-induced polyneuropathy: Secondary | ICD-10-CM

## 2017-03-14 DIAGNOSIS — Z79899 Other long term (current) drug therapy: Secondary | ICD-10-CM | POA: Diagnosis not present

## 2017-03-14 DIAGNOSIS — Z66 Do not resuscitate: Secondary | ICD-10-CM | POA: Diagnosis not present

## 2017-03-14 DIAGNOSIS — C539 Malignant neoplasm of cervix uteri, unspecified: Secondary | ICD-10-CM

## 2017-03-14 DIAGNOSIS — C7802 Secondary malignant neoplasm of left lung: Secondary | ICD-10-CM

## 2017-03-14 DIAGNOSIS — Z7189 Other specified counseling: Secondary | ICD-10-CM

## 2017-03-14 DIAGNOSIS — T451X5A Adverse effect of antineoplastic and immunosuppressive drugs, initial encounter: Secondary | ICD-10-CM

## 2017-03-14 DIAGNOSIS — Z933 Colostomy status: Secondary | ICD-10-CM | POA: Diagnosis not present

## 2017-03-14 MED ORDER — PROCHLORPERAZINE MALEATE 10 MG PO TABS: 10.0000 mg | ORAL_TABLET | Freq: Four times a day (QID) | ORAL | 1 refills | Status: DC | PRN

## 2017-03-14 MED ORDER — LIDOCAINE-PRILOCAINE 2.5-2.5 % EX CREA: 1.0000 "application " | TOPICAL_CREAM | CUTANEOUS | 6 refills | Status: AC | PRN

## 2017-03-14 MED ORDER — ONDANSETRON HCL 8 MG PO TABS: 8.0000 mg | ORAL_TABLET | Freq: Three times a day (TID) | ORAL | 3 refills | Status: AC | PRN

## 2017-03-14 NOTE — Telephone Encounter (Signed)
Gave patient avs and calendar with appts per 1/10 los °

## 2017-03-15 ENCOUNTER — Encounter: Payer: Self-pay | Admitting: Hematology and Oncology

## 2017-03-15 NOTE — Progress Notes (Signed)
[facility] OFFICE PROGRESS NOTE  Patient Care Team: [name], MD as PCP - General (Family Medicine) Carmel Sacramento, OD as Consulting Physician (Optometry)  SUMMARY OF ONCOLOGIC HISTORY: Oncology History   PD-L 1 testing neg     Cervical cancer ([place])   04/26/2015 Imaging    Numerous bilateral pulmonary nodules consistent with metastatic disease are associated with liver lesions, omental nodules, mesenteric nodules, necrotic retroperitoneal lymphadenopathy in a mixed cystic and solid mass in the central pelvis involving the uterus extending into the cul-de-sac and both adnexal regions. Uterine primary (likely endometrial) is favored over an ovarian primary malignancy given the lack of ascites, associated intrahepatic metastases and pulmonary involvement.      05/04/2015 Procedure    Technically successful CT-guided core biopsy of left pelvic mass      05/04/2015 Pathology Results    Soft Tissue Needle Core Biopsy, left ext iliac mass METASTATIC HIGH GRADE POORLY DIFFERENTIATED CARCINOMA CONSISTENT WITH CERVICAL ORIGIN Microscopic Comment The neoplasm stains positive for cervical markers : p16, ER, ck8/18, and p63 (focal), TTF-1 (focal) and negative for synaptophysin, Chromogranin (neuroendocrine markers) and ck5/6. The morphology and immunohistochemistry staining pattern support these cells are GYN cervical origin.Spec      05/10/2015 Imaging    Innumerable (> than 40) pulmonary nodules randomly distributed throughout both lungs, largest 1.1 cm, in keeping with pulmonary metastases. 2. Left supraclavicular and left hilar nodal metastases. 3. Re- demonstration of liver metastases. 4. Right thyroid lobe 1.4 cm pulmonary nodule, for which no further evaluation is recommended. 5. Stable mild superior T11 vertebral compression deformity.      05/12/2015 - 05/12/2015 Chemotherapy    She received 1 dose of carbo/taxol only      05/15/2015 Imaging    Widely metastatic cervical  carcinoma with unchanged appearance compared to 04/26/2015. Bulky tumor narrows and likely invades the rectum/distal sigmoid with progressive proximal stool retention      05/15/2015 - 05/23/2015 Hospital Admission    She was admitted for severe bowel obstruction requiring urgent diversion colostomy      05/18/2015 Surgery    She underwent LAPAROSCOPIC DIVERTING SIGMOID LOOP COLOSTOMY for bowel obstruction       06/10/2015 Procedure    Successful placement of a right internal jugular approach power injectable Port-A-Cath. The catheter is ready for immediate use      06/23/2015 - 03/01/2016 Chemotherapy    She received combination treatment with cisplatin, Taxol and Avastin. Avastin was started on 5/11. Dose of Taxol was modified due to neuropathy and cisplatin was omited last dose due to 'weakness'      09/13/2015 Imaging    Significant interval treatment response. Pulmonary and liver metastases have significantly decreased in size. Thoracic adenopathy has decreased in size. Mixed attenuation mass encompassing the uterus and bilateral adnexal regions has significantly decreased in size. No residual visualized peritoneal tumor implants. No new or progressive metastatic disease. 2. No evidence of bowel obstruction or acute bowel inflammation status post diverting sigmoid colostomy. 3. Additional findings include mild aortic atherosclerosis and Stable mild chronic superior T12 compression fracture.      12/14/2015 Imaging    Stable to improved interval exam. Multiple bilateral pulmonary nodules are stable in size to decreased and the liver lesions are stable to decreased. Mixed attenuation lesion encompassing the uterus and adnexal region shows decrease in size. There is no evidence for new or progressive disease on today's exam.      05/15/2016 Imaging    Findings today reflecting  mixed interval response to therapy. 2. There has been mild increase in size of pulmonary nodules. 3. Interval  increase in size of pelvic mass 4. Further regression of liver metastases 5. There is a new soft tissue nodule within the right lower quadrant peritoneal cavity. Suspicious peritoneal disease      06/07/2016 -  Chemotherapy    She received chemo with cisplatin and gemzar      06/14/2016 Adverse Reaction    Cycle 1, day 8 is put on hold due to severe pancytopenia      08/29/2016 Imaging    Ct scan of chest, abdomen and pelvis: 1. General improvement, with reduced size of the scattered pulmonary nodules, and also a much more normal contour and appearance of the uterus, without the visible enhancing irregular mass shown on the 05/15/2016 exam involving the uterus and cervix. There still some mild soft tissue prominence of the cervix, and a cystic lesion of the left ovary as noted above. No pathologic adenopathy. 2. The right lower quadrant omental nodule has considerably improved and nearly resolved. 3. The prior hepatic metastatic lesions have essentially resolved, with only minimal residual linear hypodensity in segment 4 at the site of the prior large metastatic lesion shown on 05/15/2015.  4. Other imaging findings of potential clinical significance: Left lower quadrant colostomy. Chronic superior endplate compression at T11. Bony demineralization. Trace free pelvic fluid in the cul-de-sac. Central disc protrusion at L3-4. Left mid kidney hypodense lesion is likely a cyst.      12/12/2016 Imaging    1. Suspected minimal enlargement of at least 1 pulmonary nodule. The majority of pulmonary nodules are similar. Cannot exclude minimal pulmonary metastasis progression. 2. No evidence of extrathoracic progressive metastatic disease. Similar soft tissue fullness within the uterine cervix and similar to slight decrease in size of a tiny omental nodule. 3. Aortic Atherosclerosis (ICD10-I70.0). 4. Chronic T10 compression deformity      03/13/2017 Imaging    1. Interval progression of pre-existing  pulmonary metastases with interval development of new pulmonary metastatic lesions. 2. Interval development of new and progression of pre-existing omental metastases. 3. Marked interval progression of cervical disease with dilated fluid-filled endometrial canal in the uterine fundus. The cervical disease infiltrates into the lower uterine segment and likely extends into the adnexal space bilaterally. Posterior extent of cervical disease is tethered to the distal sigmoid colon in this patient with a sigmoid loop colostomy. 4. Stable atrophy of the medial segment left liver.       INTERVAL HISTORY: Please see below for problem oriented charting. She returns for further follow-up She complained of intermittent chest discomfort on the left side it comes and goes, not related to activity Appetite is stable, no recent weight loss She had frequent urination Denies dysuria, frequency or urgency Denies significant peripheral neuropathy from prior treatment  REVIEW OF SYSTEMS:   Constitutional: Denies fevers, chills or abnormal weight loss Eyes: Denies blurriness of vision Ears, nose, mouth, throat, and face: Denies mucositis or sore throat Respiratory: Denies cough, dyspnea or wheezes Gastrointestinal:  Denies nausea, heartburn or change in bowel habits Skin: Denies abnormal skin rashes Lymphatics: Denies new lymphadenopathy or easy bruising Neurological:Denies numbness, tingling or new weaknesses Behavioral/Psych: Mood is stable, no new changes  All other systems were reviewed with the patient and are negative.  I have reviewed the past medical history, past surgical history, social history and family history with the patient and they are unchanged from previous note.  ALLERGIES:  has No  Known Allergies.  MEDICATIONS:  Current Outpatient Medications  Medication Sig Dispense Refill  . cholecalciferol (VITAMIN D) 1000 units tablet Take 1,000 Units by mouth daily.    Marland Kitchen lidocaine-prilocaine  (EMLA) cream Apply 1 application topically as needed. 30 g 6  . omeprazole (PRILOSEC) 20 MG capsule TAKE 1 CAPSULE BY MOUTH EVERY DAY 90 capsule 0  . ondansetron (ZOFRAN) 8 MG tablet Take 1 tablet (8 mg total) by mouth every 8 (eight) hours as needed for nausea. 30 tablet 3  . prochlorperazine (COMPAZINE) 10 MG tablet Take 1 tablet (10 mg total) by mouth every 6 (six) hours as needed for nausea or vomiting. 30 tablet 1  . temazepam (RESTORIL) 15 MG capsule Take 15 mg by mouth at bedtime as needed for sleep.     No current facility-administered medications for this visit.    Facility-Administered Medications Ordered in Other Visits  Medication Dose Route Frequency Provider Last Rate Last Dose  . heparin lock flush 100 unit/mL  500 Units Intravenous Once Livesay, Lennis P, MD      . sodium chloride flush (NS) 0.9 % injection 10 mL  10 mL Intravenous PRN Livesay, Lennis P, MD        PHYSICAL EXAMINATION: ECOG PERFORMANCE STATUS: 1 - Symptomatic but completely ambulatory  Vitals:   03/14/17 1327  BP: 134/73  Pulse: 82  Resp: 18  Temp: 97.6 F (36.4 C)  SpO2: 98%   Filed Weights   03/14/17 1327  Weight: 175 lb (79.4 kg)    GENERAL:alert, no distress and comfortable SKIN: skin color, texture, turgor are normal, no rashes or significant lesions EYES: normal, Conjunctiva are pink and non-injected, sclera clear OROPHARYNX:no exudate, no erythema and lips, buccal mucosa, and tongue normal  NECK: supple, thyroid normal size, non-tender, without nodularity LYMPH:  no palpable lymphadenopathy in the cervical, axillary or inguinal LUNGS: clear to auscultation and percussion with normal breathing effort HEART: regular rate & rhythm and no murmurs and no lower extremity edema ABDOMEN:abdomen soft, non-tender and normal bowel sounds Musculoskeletal:no cyanosis of digits and no clubbing  NEURO: alert & oriented x 3 with fluent speech, no focal motor/sensory deficits  LABORATORY DATA:  I  have reviewed the data as listed    Component Value Date/Time   NA 140 03/13/2017 1042   NA 142 12/12/2016 0745   K 4.3 03/13/2017 1042   K 4.5 12/12/2016 0745   CL 106 03/13/2017 1042   CO2 25 03/13/2017 1042   CO2 24 12/12/2016 0745   GLUCOSE 87 03/13/2017 1042   GLUCOSE 93 12/12/2016 0745   BUN 17 03/13/2017 1042   BUN 13.8 12/12/2016 0745   CREATININE 1.32 (H) 03/13/2017 1042   CREATININE 1.1 12/12/2016 0745   CALCIUM 9.2 03/13/2017 1042   CALCIUM 9.3 12/12/2016 0745   PROT 7.8 03/13/2017 1042   PROT 6.9 12/12/2016 0745   ALBUMIN 3.9 03/13/2017 1042   ALBUMIN 3.6 12/12/2016 0745   AST 19 03/13/2017 1042   AST 15 12/12/2016 0745   ALT 12 03/13/2017 1042   ALT 10 12/12/2016 0745   ALKPHOS 130 03/13/2017 1042   ALKPHOS 97 12/12/2016 0745   BILITOT 0.5 03/13/2017 1042   BILITOT 0.34 12/12/2016 0745   GFRNONAA 40 (L) 03/13/2017 1042   GFRAA 46 (L) 03/13/2017 1042    No results found for: SPEP, UPEP  Lab Results  Component Value Date   WBC 5.7 03/13/2017   NEUTROABS 3.1 03/13/2017   HGB 11.6 03/13/2017   HCT 35.2  03/13/2017   MCV 87.3 03/13/2017   PLT 155 03/13/2017      Chemistry      Component Value Date/Time   NA 140 03/13/2017 1042   NA 142 12/12/2016 0745   K 4.3 03/13/2017 1042   K 4.5 12/12/2016 0745   CL 106 03/13/2017 1042   CO2 25 03/13/2017 1042   CO2 24 12/12/2016 0745   BUN 17 03/13/2017 1042   BUN 13.8 12/12/2016 0745   CREATININE 1.32 (H) 03/13/2017 1042   CREATININE 1.1 12/12/2016 0745      Component Value Date/Time   CALCIUM 9.2 03/13/2017 1042   CALCIUM 9.3 12/12/2016 0745   ALKPHOS 130 03/13/2017 1042   ALKPHOS 97 12/12/2016 0745   AST 19 03/13/2017 1042   AST 15 12/12/2016 0745   ALT 12 03/13/2017 1042   ALT 10 12/12/2016 0745   BILITOT 0.5 03/13/2017 1042   BILITOT 0.34 12/12/2016 0745       RADIOGRAPHIC STUDIES: I have personally reviewed the radiological images as listed and agreed with the findings in the report. Ct  Chest W Contrast  Result Date: 03/13/2017 CLINICAL DATA:  Cervical cancer metastatic to lung and liver. EXAM: CT CHEST, ABDOMEN, AND PELVIS WITH CONTRAST TECHNIQUE: Multidetector CT imaging of the chest, abdomen and pelvis was performed following the standard protocol during bolus administration of intravenous contrast. CONTRAST:  174mL ISOVUE-300 IOPAMIDOL (ISOVUE-300) INJECTION 61% COMPARISON:  12/12/2016 FINDINGS: CT CHEST FINDINGS Cardiovascular: The heart size is normal. No pericardial effusion. No thoracic aortic aneurysm. Right Port-A-Cath tip is positioned in the distal SVC. Mediastinum/Nodes: No mediastinal lymphadenopathy. There is no hilar lymphadenopathy. The esophagus has normal imaging features. There is no axillary lymphadenopathy. Lungs/Pleura: Interval development of multiple new pulmonary nodules. As examples, 4 mm left upper lobe nodule (image 32 series 7), 4 mm right lower lobe perifissural nodule (image 62), and 5 mm right lower lobe nodule (image 92) are new in the interval. Other nodules have progressed since the prior study. 5 mm right lower lobe nodule measured on the previous study has increased to 12 mm on image 91 of series 7 today. 17 x 15 left parahilar nodule on image 74 has progressed in the interval. 6 mm left upper lobe nodule measured previously is now 9 mm on image 50 of series 7. 11 mm right lower lobe nodule on image 112 was 5 mm previously. Musculoskeletal: Sclerotic lesion left scapula is similar to prior. CT ABDOMEN PELVIS FINDINGS Hepatobiliary: No focal abnormality within the liver parenchyma. Atrophy of the medial segment left liver is stable. No intrahepatic or extrahepatic biliary dilation. Pancreas: No focal mass lesion. No dilatation of the main duct. No intraparenchymal cyst. No peripancreatic edema. Spleen: No splenomegaly. No focal mass lesion. Adrenals/Urinary Tract: No adrenal nodule or mass. Similar appearance 11 mm probable cyst in the lower pole the left  kidney. Right kidney unremarkable. The urinary bladder appears normal for the degree of distention. 16 mm rim enhancing nodule involving the inferior bladder wall, near the origin of the urethra has progressed from 5 mm previously. Stomach/Bowel: Stomach is nondistended. No gastric wall thickening. No evidence of outlet obstruction. Duodenum is normally positioned as is the ligament of Treitz. No small bowel wall thickening. No small bowel dilatation. The terminal ileum is normal. The appendix is not visualized, but there is no edema or inflammation in the region of the cecum. Sigmoid loop colostomy noted. Vascular/Lymphatic: No abdominal aortic aneurysm. No abdominal lymphadenopathy. No pelvic sidewall lymphadenopathy. Reproductive: Interval enlargement  of the uterus and the endometrial cavity in the fundus is become dilated and fluid-filled in the interval since prior study. Heterogeneous enhancement of a cervical soft tissue mass has progressed with infiltration into the lower uterus. The cervical tumor is contiguous with and tethered to the distal sigmoid colon. Probable progressive disease infiltrating into both adnexal regions, right greater than left. Other: No intraperitoneal free fluid. Tiny right omental nodule seen on the prior study has progressed to 17 mm in the interval. There is a new 9 mm omental nodule on image 85. Musculoskeletal: Bone windows reveal no worrisome lytic or sclerotic osseous lesions. IMPRESSION: 1. Interval progression of pre-existing pulmonary metastases with interval development of new pulmonary metastatic lesions. 2. Interval development of new and progression of pre-existing omental metastases. 3. Marked interval progression of cervical disease with dilated fluid-filled endometrial canal in the uterine fundus. The cervical disease infiltrates into the lower uterine segment and likely extends into the adnexal space bilaterally. Posterior extent of cervical disease is tethered to  the distal sigmoid colon in this patient with a sigmoid loop colostomy. 4. Stable atrophy of the medial segment left liver. Electronically Signed   By: Misty Stanley M.D.   On: 03/13/2017 15:25   Ct Abdomen Pelvis W Contrast  Result Date: 03/13/2017 CLINICAL DATA:  Cervical cancer metastatic to lung and liver. EXAM: CT CHEST, ABDOMEN, AND PELVIS WITH CONTRAST TECHNIQUE: Multidetector CT imaging of the chest, abdomen and pelvis was performed following the standard protocol during bolus administration of intravenous contrast. CONTRAST:  125mL ISOVUE-300 IOPAMIDOL (ISOVUE-300) INJECTION 61% COMPARISON:  12/12/2016 FINDINGS: CT CHEST FINDINGS Cardiovascular: The heart size is normal. No pericardial effusion. No thoracic aortic aneurysm. Right Port-A-Cath tip is positioned in the distal SVC. Mediastinum/Nodes: No mediastinal lymphadenopathy. There is no hilar lymphadenopathy. The esophagus has normal imaging features. There is no axillary lymphadenopathy. Lungs/Pleura: Interval development of multiple new pulmonary nodules. As examples, 4 mm left upper lobe nodule (image 32 series 7), 4 mm right lower lobe perifissural nodule (image 62), and 5 mm right lower lobe nodule (image 92) are new in the interval. Other nodules have progressed since the prior study. 5 mm right lower lobe nodule measured on the previous study has increased to 12 mm on image 91 of series 7 today. 17 x 15 left parahilar nodule on image 74 has progressed in the interval. 6 mm left upper lobe nodule measured previously is now 9 mm on image 50 of series 7. 11 mm right lower lobe nodule on image 112 was 5 mm previously. Musculoskeletal: Sclerotic lesion left scapula is similar to prior. CT ABDOMEN PELVIS FINDINGS Hepatobiliary: No focal abnormality within the liver parenchyma. Atrophy of the medial segment left liver is stable. No intrahepatic or extrahepatic biliary dilation. Pancreas: No focal mass lesion. No dilatation of the main duct. No  intraparenchymal cyst. No peripancreatic edema. Spleen: No splenomegaly. No focal mass lesion. Adrenals/Urinary Tract: No adrenal nodule or mass. Similar appearance 11 mm probable cyst in the lower pole the left kidney. Right kidney unremarkable. The urinary bladder appears normal for the degree of distention. 16 mm rim enhancing nodule involving the inferior bladder wall, near the origin of the urethra has progressed from 5 mm previously. Stomach/Bowel: Stomach is nondistended. No gastric wall thickening. No evidence of outlet obstruction. Duodenum is normally positioned as is the ligament of Treitz. No small bowel wall thickening. No small bowel dilatation. The terminal ileum is normal. The appendix is not visualized, but there is no edema  or inflammation in the region of the cecum. Sigmoid loop colostomy noted. Vascular/Lymphatic: No abdominal aortic aneurysm. No abdominal lymphadenopathy. No pelvic sidewall lymphadenopathy. Reproductive: Interval enlargement of the uterus and the endometrial cavity in the fundus is become dilated and fluid-filled in the interval since prior study. Heterogeneous enhancement of a cervical soft tissue mass has progressed with infiltration into the lower uterus. The cervical tumor is contiguous with and tethered to the distal sigmoid colon. Probable progressive disease infiltrating into both adnexal regions, right greater than left. Other: No intraperitoneal free fluid. Tiny right omental nodule seen on the prior study has progressed to 17 mm in the interval. There is a new 9 mm omental nodule on image 85. Musculoskeletal: Bone windows reveal no worrisome lytic or sclerotic osseous lesions. IMPRESSION: 1. Interval progression of pre-existing pulmonary metastases with interval development of new pulmonary metastatic lesions. 2. Interval development of new and progression of pre-existing omental metastases. 3. Marked interval progression of cervical disease with dilated fluid-filled  endometrial canal in the uterine fundus. The cervical disease infiltrates into the lower uterine segment and likely extends into the adnexal space bilaterally. Posterior extent of cervical disease is tethered to the distal sigmoid colon in this patient with a sigmoid loop colostomy. 4. Stable atrophy of the medial segment left liver. Electronically Signed   By: Misty Stanley M.D.   On: 03/13/2017 15:25    ASSESSMENT & PLAN:  Cervical cancer ([place]) Unfortunately, the patient have recurrence of disease Previously, she responded well to combination treatment with cisplatin and gemcitabine, however, treatment was discontinued due to severe pancytopenia and the patient desire for chemo holiday She agrees to resume treatment We discussed the current guidelines and ultimately, the patient agreed to restart treatment with combination cisplatin and gemcitabine, to be given on days 1 and 8 and rest day 15, cycle of every 21 days I will proceed with dose adjustment given history of pancytopenia The patient understood the goal of treatment is strictly palliative Some of the common risks, benefits, side effects of treatment including risk of renal failure, pancytopenia, risk of infection, neuropathy and others were discussed and she agreed to proceed  Chemotherapy-induced peripheral neuropathy ([place]) She has mild residual peripheral neuropathy but not worse We will observe and proceed with dose reduce chemotherapy as discussed.  Malignant neoplasm metastatic to left lung [facility]) She had mild symptoms of left chest pain It could be due to disease The patient declined pain medicine  Goals of care, counseling/discussion The patient is aware she has incurable disease and treatment is strictly palliative. We discussed importance of Advanced Directives and Living will. We discussed CODE STATUS; the patient desires to be DNR. We also discussed potential referral for home base palliative care but the patient  declined for now.   Urinary urgency Likely due to compression of the bladder from enlarged uterus Observe only for now   No orders of the defined types were placed in this encounter.  All questions were answered. The patient knows to call the clinic with any problems, questions or concerns. No barriers to learning was detected. I spent 30 minutes counseling the patient face to face. The total time spent in the appointment was 40 minutes and more than 50% was on counseling and review of test results     Heath Lark, MD 03/15/2017 12:10 PM

## 2017-03-15 NOTE — Assessment & Plan Note (Signed)
Unfortunately, the patient have recurrence of disease Previously, she responded well to combination treatment with cisplatin and gemcitabine, however, treatment was discontinued due to severe pancytopenia and the patient desire for chemo holiday She agrees to resume treatment We discussed the current guidelines and ultimately, the patient agreed to restart treatment with combination cisplatin and gemcitabine, to be given on days 1 and 8 and rest day 15, cycle of every 21 days I will proceed with dose adjustment given history of pancytopenia The patient understood the goal of treatment is strictly palliative Some of the common risks, benefits, side effects of treatment including risk of renal failure, pancytopenia, risk of infection, neuropathy and others were discussed and she agreed to proceed

## 2017-03-15 NOTE — Assessment & Plan Note (Signed)
The patient is aware she has incurable disease and treatment is strictly palliative. We discussed importance of Advanced Directives and Living will. We discussed CODE STATUS; the patient desires to be DNR. We also discussed potential referral for home base palliative care but the patient declined for now.

## 2017-03-15 NOTE — Assessment & Plan Note (Signed)
She has mild residual peripheral neuropathy but not worse We will observe and proceed with dose reduce chemotherapy as discussed.

## 2017-03-15 NOTE — Assessment & Plan Note (Signed)
She had mild symptoms of left chest pain It could be due to disease The patient declined pain medicine

## 2017-03-15 NOTE — Assessment & Plan Note (Signed)
Likely due to compression of the bladder from enlarged uterus Observe only for now

## 2017-03-22 ENCOUNTER — Inpatient Hospital Stay: Payer: Medicare HMO

## 2017-03-22 VITALS — BP 131/75 | HR 68 | Temp 98.2°F | Resp 16

## 2017-03-22 DIAGNOSIS — C7801 Secondary malignant neoplasm of right lung: Secondary | ICD-10-CM

## 2017-03-22 DIAGNOSIS — D61818 Other pancytopenia: Secondary | ICD-10-CM | POA: Diagnosis not present

## 2017-03-22 DIAGNOSIS — C7802 Secondary malignant neoplasm of left lung: Secondary | ICD-10-CM | POA: Diagnosis not present

## 2017-03-22 DIAGNOSIS — Z66 Do not resuscitate: Secondary | ICD-10-CM | POA: Diagnosis not present

## 2017-03-22 DIAGNOSIS — C799 Secondary malignant neoplasm of unspecified site: Secondary | ICD-10-CM

## 2017-03-22 DIAGNOSIS — C787 Secondary malignant neoplasm of liver and intrahepatic bile duct: Secondary | ICD-10-CM | POA: Diagnosis not present

## 2017-03-22 DIAGNOSIS — Z79899 Other long term (current) drug therapy: Secondary | ICD-10-CM | POA: Diagnosis not present

## 2017-03-22 DIAGNOSIS — C786 Secondary malignant neoplasm of retroperitoneum and peritoneum: Secondary | ICD-10-CM | POA: Diagnosis not present

## 2017-03-22 DIAGNOSIS — Z933 Colostomy status: Secondary | ICD-10-CM | POA: Diagnosis not present

## 2017-03-22 DIAGNOSIS — C539 Malignant neoplasm of cervix uteri, unspecified: Secondary | ICD-10-CM | POA: Diagnosis not present

## 2017-03-22 MED ORDER — CISPLATIN CHEMO INJECTION 100MG/100ML
26.5000 mg/m2 | Freq: Once | INTRAVENOUS | Status: AC
  Administered 2017-03-22: 50 mg via INTRAVENOUS
  Filled 2017-03-22: qty 50

## 2017-03-22 MED ORDER — PALONOSETRON HCL INJECTION 0.25 MG/5ML
0.2500 mg | Freq: Once | INTRAVENOUS | Status: AC
  Administered 2017-03-22: 0.25 mg via INTRAVENOUS

## 2017-03-22 MED ORDER — SODIUM CHLORIDE 0.9 % IV SOLN
500.0000 mg/m2 | Freq: Once | INTRAVENOUS | Status: AC
  Administered 2017-03-22: 950 mg via INTRAVENOUS
  Filled 2017-03-22: qty 24.98

## 2017-03-22 MED ORDER — FOSAPREPITANT DIMEGLUMINE INJECTION 150 MG
Freq: Once | INTRAVENOUS | Status: AC
  Administered 2017-03-22: 14:00:00 via INTRAVENOUS
  Filled 2017-03-22: qty 5

## 2017-03-22 MED ORDER — DEXTROSE-NACL 5-0.45 % IV SOLN
Freq: Once | INTRAVENOUS | Status: AC
  Administered 2017-03-22: 11:00:00 via INTRAVENOUS
  Filled 2017-03-22: qty 10

## 2017-03-22 MED ORDER — SODIUM CHLORIDE 0.9 % IV SOLN
Freq: Once | INTRAVENOUS | Status: AC
  Administered 2017-03-22: 11:00:00 via INTRAVENOUS

## 2017-03-22 MED ORDER — HEPARIN SOD (PORK) LOCK FLUSH 100 UNIT/ML IV SOLN
500.0000 [IU] | Freq: Once | INTRAVENOUS | Status: AC | PRN
  Administered 2017-03-22: 500 [IU]
  Filled 2017-03-22: qty 5

## 2017-03-22 MED ORDER — SODIUM CHLORIDE 0.9% FLUSH
10.0000 mL | INTRAVENOUS | Status: DC | PRN
  Administered 2017-03-22: 10 mL
  Filled 2017-03-22: qty 10

## 2017-03-22 NOTE — Patient Instructions (Signed)
[facility] Discharge Instructions for Patients Receiving Chemotherapy  Today you received the following chemotherapy agents:  Gemzar and Cisplatin.  To help prevent nausea and vomiting after your treatment, we encourage you to take your nausea medication as directed.   If you develop nausea and vomiting that is not controlled by your nausea medication, call the clinic.   BELOW ARE SYMPTOMS THAT SHOULD BE REPORTED IMMEDIATELY:  *FEVER GREATER THAN 100.5 F  *CHILLS WITH OR WITHOUT FEVER  NAUSEA AND VOMITING THAT IS NOT CONTROLLED WITH YOUR NAUSEA MEDICATION  *UNUSUAL SHORTNESS OF BREATH  *UNUSUAL BRUISING OR BLEEDING  TENDERNESS IN MOUTH AND THROAT WITH OR WITHOUT PRESENCE OF ULCERS  *URINARY PROBLEMS  *BOWEL PROBLEMS  UNUSUAL RASH Items with * indicate a potential emergency and should be followed up as soon as possible.  Feel free to call the clinic you have any questions or concerns. The clinic phone number is [phone].  Please show the CHEMO ALERT CARD at check-in to the Emergency Department and triage nurse.   

## 2017-03-29 ENCOUNTER — Inpatient Hospital Stay: Payer: Medicare HMO

## 2017-03-29 VITALS — BP 112/57 | HR 75 | Temp 98.4°F | Resp 17

## 2017-03-29 DIAGNOSIS — C7802 Secondary malignant neoplasm of left lung: Secondary | ICD-10-CM | POA: Diagnosis not present

## 2017-03-29 DIAGNOSIS — C787 Secondary malignant neoplasm of liver and intrahepatic bile duct: Secondary | ICD-10-CM

## 2017-03-29 DIAGNOSIS — C786 Secondary malignant neoplasm of retroperitoneum and peritoneum: Secondary | ICD-10-CM | POA: Diagnosis not present

## 2017-03-29 DIAGNOSIS — Z79899 Other long term (current) drug therapy: Secondary | ICD-10-CM | POA: Diagnosis not present

## 2017-03-29 DIAGNOSIS — C539 Malignant neoplasm of cervix uteri, unspecified: Secondary | ICD-10-CM

## 2017-03-29 DIAGNOSIS — C7801 Secondary malignant neoplasm of right lung: Secondary | ICD-10-CM

## 2017-03-29 DIAGNOSIS — Z66 Do not resuscitate: Secondary | ICD-10-CM | POA: Diagnosis not present

## 2017-03-29 DIAGNOSIS — D61818 Other pancytopenia: Secondary | ICD-10-CM | POA: Diagnosis not present

## 2017-03-29 DIAGNOSIS — C799 Secondary malignant neoplasm of unspecified site: Secondary | ICD-10-CM

## 2017-03-29 DIAGNOSIS — Z933 Colostomy status: Secondary | ICD-10-CM | POA: Diagnosis not present

## 2017-03-29 LAB — COMPREHENSIVE METABOLIC PANEL
ALK PHOS: 125 U/L (ref 40–150)
ALT: 20 U/L (ref 0–55)
ANION GAP: 10 (ref 3–11)
AST: 21 U/L (ref 5–34)
Albumin: 3.7 g/dL (ref 3.5–5.0)
BILIRUBIN TOTAL: 0.4 mg/dL (ref 0.2–1.2)
BUN: 22 mg/dL (ref 7–26)
CALCIUM: 9 mg/dL (ref 8.4–10.4)
CO2: 24 mmol/L (ref 22–29)
CREATININE: 1.44 mg/dL — AB (ref 0.60–1.10)
Chloride: 103 mmol/L (ref 98–109)
GFR calc Af Amer: 41 mL/min — ABNORMAL LOW (ref >60)
GFR calc non Af Amer: 36 mL/min — ABNORMAL LOW (ref >60)
Glucose, Bld: 125 mg/dL (ref 70–140)
Potassium: 4.2 mmol/L (ref 3.3–4.7)
Sodium: 137 mmol/L (ref 136–145)
TOTAL PROTEIN: 7.6 g/dL (ref 6.4–8.3)

## 2017-03-29 LAB — CBC WITH DIFFERENTIAL/PLATELET
Basophils Absolute: 0 10*3/uL (ref 0.0–0.1)
Basophils Relative: 1 %
Eosinophils Absolute: 0 10*3/uL (ref 0.0–0.5)
Eosinophils Relative: 1 %
HEMATOCRIT: 33.2 % — AB (ref 34.8–46.6)
HEMOGLOBIN: 10.9 g/dL — AB (ref 11.6–15.9)
LYMPHS ABS: 1.6 10*3/uL (ref 0.9–3.3)
LYMPHS PCT: 42 %
MCH: 28.5 pg (ref 25.1–34.0)
MCHC: 32.8 g/dL (ref 31.5–36.0)
MCV: 86.7 fL (ref 79.5–101.0)
Monocytes Absolute: 0 10*3/uL — ABNORMAL LOW (ref 0.1–0.9)
Monocytes Relative: 1 %
NEUTROS ABS: 2.2 10*3/uL (ref 1.5–6.5)
NEUTROS PCT: 55 %
Platelets: 104 10*3/uL — ABNORMAL LOW (ref 145–400)
RBC: 3.83 MIL/uL (ref 3.70–5.45)
RDW: 13 % (ref 11.2–16.1)
WBC: 3.8 10*3/uL — AB (ref 3.9–10.3)

## 2017-03-29 MED ORDER — SODIUM CHLORIDE 0.9 % IV SOLN
500.0000 mg/m2 | Freq: Once | INTRAVENOUS | Status: AC
  Administered 2017-03-29: 950 mg via INTRAVENOUS
  Filled 2017-03-29: qty 24.98

## 2017-03-29 MED ORDER — SODIUM CHLORIDE 0.9 % IV SOLN
Freq: Once | INTRAVENOUS | Status: AC
  Administered 2017-03-29: 15:00:00 via INTRAVENOUS

## 2017-03-29 MED ORDER — PROCHLORPERAZINE MALEATE 10 MG PO TABS
10.0000 mg | ORAL_TABLET | Freq: Once | ORAL | Status: AC
  Administered 2017-03-29: 10 mg via ORAL

## 2017-03-29 MED ORDER — HEPARIN SOD (PORK) LOCK FLUSH 100 UNIT/ML IV SOLN
500.0000 [IU] | Freq: Once | INTRAVENOUS | Status: AC | PRN
  Administered 2017-03-29: 500 [IU]
  Filled 2017-03-29: qty 5

## 2017-03-29 MED ORDER — SODIUM CHLORIDE 0.9% FLUSH
10.0000 mL | INTRAVENOUS | Status: AC | PRN
  Administered 2017-03-29: 10 mL
  Filled 2017-03-29: qty 10

## 2017-03-29 NOTE — Progress Notes (Unsigned)
Dr. Alvy Bimler out of office; Per Dr. Jana Hakim OK to treat with plts 104 and creat 1.44

## 2017-03-29 NOTE — Patient Instructions (Signed)
[facility] Discharge Instructions for Patients Receiving Chemotherapy  Today you received the following chemotherapy agents: Gemcitabine (Gemzar)  To help prevent nausea and vomiting after your treatment, we encourage you to take your nausea medication as prescribed.    If you develop nausea and vomiting that is not controlled by your nausea medication, call the clinic.   BELOW ARE SYMPTOMS THAT SHOULD BE REPORTED IMMEDIATELY:  *FEVER GREATER THAN 100.5 F  *CHILLS WITH OR WITHOUT FEVER  NAUSEA AND VOMITING THAT IS NOT CONTROLLED WITH YOUR NAUSEA MEDICATION  *UNUSUAL SHORTNESS OF BREATH  *UNUSUAL BRUISING OR BLEEDING  TENDERNESS IN MOUTH AND THROAT WITH OR WITHOUT PRESENCE OF ULCERS  *URINARY PROBLEMS  *BOWEL PROBLEMS  UNUSUAL RASH Items with * indicate a potential emergency and should be followed up as soon as possible.  Feel free to call the clinic should you have any questions or concerns. The clinic phone number is [phone].  Please show the CHEMO ALERT CARD at check-in to the Emergency Department and triage nurse.   

## 2017-04-12 ENCOUNTER — Telehealth: Payer: Self-pay | Admitting: Hematology and Oncology

## 2017-04-12 ENCOUNTER — Inpatient Hospital Stay: Payer: Medicare HMO

## 2017-04-12 ENCOUNTER — Inpatient Hospital Stay: Payer: Medicare HMO | Attending: Hematology and Oncology | Admitting: Hematology and Oncology

## 2017-04-12 ENCOUNTER — Encounter: Payer: Self-pay | Admitting: Hematology and Oncology

## 2017-04-12 DIAGNOSIS — C7802 Secondary malignant neoplasm of left lung: Secondary | ICD-10-CM

## 2017-04-12 DIAGNOSIS — D61818 Other pancytopenia: Secondary | ICD-10-CM | POA: Insufficient documentation

## 2017-04-12 DIAGNOSIS — C799 Secondary malignant neoplasm of unspecified site: Secondary | ICD-10-CM

## 2017-04-12 DIAGNOSIS — C7989 Secondary malignant neoplasm of other specified sites: Secondary | ICD-10-CM

## 2017-04-12 DIAGNOSIS — C7801 Secondary malignant neoplasm of right lung: Secondary | ICD-10-CM

## 2017-04-12 DIAGNOSIS — R3915 Urgency of urination: Secondary | ICD-10-CM | POA: Insufficient documentation

## 2017-04-12 DIAGNOSIS — Z933 Colostomy status: Secondary | ICD-10-CM

## 2017-04-12 DIAGNOSIS — N183 Chronic kidney disease, stage 3 unspecified: Secondary | ICD-10-CM

## 2017-04-12 DIAGNOSIS — C787 Secondary malignant neoplasm of liver and intrahepatic bile duct: Secondary | ICD-10-CM

## 2017-04-12 DIAGNOSIS — C786 Secondary malignant neoplasm of retroperitoneum and peritoneum: Secondary | ICD-10-CM | POA: Diagnosis not present

## 2017-04-12 DIAGNOSIS — R300 Dysuria: Secondary | ICD-10-CM

## 2017-04-12 DIAGNOSIS — Z79899 Other long term (current) drug therapy: Secondary | ICD-10-CM | POA: Diagnosis not present

## 2017-04-12 DIAGNOSIS — C539 Malignant neoplasm of cervix uteri, unspecified: Secondary | ICD-10-CM

## 2017-04-12 LAB — CBC WITH DIFFERENTIAL/PLATELET
Basophils Absolute: 0 10*3/uL (ref 0.0–0.1)
Basophils Relative: 0 %
Eosinophils Absolute: 0 10*3/uL (ref 0.0–0.5)
Eosinophils Relative: 2 %
HCT: 27.3 % — ABNORMAL LOW (ref 34.8–46.6)
HEMOGLOBIN: 9.2 g/dL — AB (ref 11.6–15.9)
LYMPHS ABS: 1.1 10*3/uL (ref 0.9–3.3)
LYMPHS PCT: 55 %
MCH: 29.2 pg (ref 25.1–34.0)
MCHC: 33.5 g/dL (ref 31.5–36.0)
MCV: 87 fL (ref 79.5–101.0)
MONO ABS: 0.3 10*3/uL (ref 0.1–0.9)
MONOS PCT: 15 %
NEUTROS ABS: 0.6 10*3/uL — AB (ref 1.5–6.5)
NEUTROS PCT: 28 %
Platelets: 208 10*3/uL (ref 145–400)
RBC: 3.14 MIL/uL — ABNORMAL LOW (ref 3.70–5.45)
RDW: 13.5 % (ref 11.2–14.5)
WBC: 2 10*3/uL — ABNORMAL LOW (ref 3.9–10.3)

## 2017-04-12 LAB — COMPREHENSIVE METABOLIC PANEL
ALT: 9 U/L (ref 0–55)
ANION GAP: 8 (ref 3–11)
AST: 16 U/L (ref 5–34)
Albumin: 3.6 g/dL (ref 3.5–5.0)
Alkaline Phosphatase: 98 U/L (ref 40–150)
BUN: 16 mg/dL (ref 7–26)
CHLORIDE: 109 mmol/L (ref 98–109)
CO2: 25 mmol/L (ref 22–29)
Calcium: 8.7 mg/dL (ref 8.4–10.4)
Creatinine, Ser: 1.16 mg/dL — ABNORMAL HIGH (ref 0.60–1.10)
GFR calc non Af Amer: 46 mL/min — ABNORMAL LOW (ref >60)
GFR, EST AFRICAN AMERICAN: 54 mL/min — AB (ref >60)
GLUCOSE: 85 mg/dL (ref 70–140)
Potassium: 3.9 mmol/L (ref 3.5–5.1)
SODIUM: 142 mmol/L (ref 136–145)
Total Bilirubin: 0.3 mg/dL (ref 0.2–1.2)
Total Protein: 6.8 g/dL (ref 6.4–8.3)

## 2017-04-12 MED ORDER — SODIUM CHLORIDE 0.9% FLUSH
10.0000 mL | Freq: Once | INTRAVENOUS | Status: AC
  Administered 2017-04-12: 10 mL
  Filled 2017-04-12: qty 10

## 2017-04-12 NOTE — Assessment & Plan Note (Signed)
She has some mild symptom and bleeding She cannot tell whether the bleeding comes from the urine or vagina I recommend urinalysis and urine culture to exclude UTI and she agreed

## 2017-04-12 NOTE — Progress Notes (Signed)
[place] OFFICE PROGRESS NOTE  Patient Care Team: [name], MD as PCP - General (Family Medicine) Carmel Sacramento, OD as Consulting Physician (Optometry)  SUMMARY OF ONCOLOGIC HISTORY: Oncology History   PD-L 1 testing neg     Cervical cancer ([place])   04/26/2015 Imaging    Numerous bilateral pulmonary nodules consistent with metastatic disease are associated with liver lesions, omental nodules, mesenteric nodules, necrotic retroperitoneal lymphadenopathy in a mixed cystic and solid mass in the central pelvis involving the uterus extending into the cul-de-sac and both adnexal regions. Uterine primary (likely endometrial) is favored over an ovarian primary malignancy given the lack of ascites, associated intrahepatic metastases and pulmonary involvement.      05/04/2015 Procedure    Technically successful CT-guided core biopsy of left pelvic mass      05/04/2015 Pathology Results    Soft Tissue Needle Core Biopsy, left ext iliac mass METASTATIC HIGH GRADE POORLY DIFFERENTIATED CARCINOMA CONSISTENT WITH CERVICAL ORIGIN Microscopic Comment The neoplasm stains positive for cervical markers : p16, ER, ck8/18, and p63 (focal), TTF-1 (focal) and negative for synaptophysin, Chromogranin (neuroendocrine markers) and ck5/6. The morphology and immunohistochemistry staining pattern support these cells are GYN cervical origin.Spec      05/10/2015 Imaging    Innumerable (> than 40) pulmonary nodules randomly distributed throughout both lungs, largest 1.1 cm, in keeping with pulmonary metastases. 2. Left supraclavicular and left hilar nodal metastases. 3. Re- demonstration of liver metastases. 4. Right thyroid lobe 1.4 cm pulmonary nodule, for which no further evaluation is recommended. 5. Stable mild superior T11 vertebral compression deformity.      05/12/2015 - 05/12/2015 Chemotherapy    She received 1 dose of carbo/taxol only      05/15/2015 Imaging    Widely metastatic cervical  carcinoma with unchanged appearance compared to 04/26/2015. Bulky tumor narrows and likely invades the rectum/distal sigmoid with progressive proximal stool retention      05/15/2015 - 05/23/2015 Hospital Admission    She was admitted for severe bowel obstruction requiring urgent diversion colostomy      05/18/2015 Surgery    She underwent LAPAROSCOPIC DIVERTING SIGMOID LOOP COLOSTOMY for bowel obstruction       06/10/2015 Procedure    Successful placement of a right internal jugular approach power injectable Port-A-Cath. The catheter is ready for immediate use      06/23/2015 - 03/01/2016 Chemotherapy    She received combination treatment with cisplatin, Taxol and Avastin. Avastin was started on 5/11. Dose of Taxol was modified due to neuropathy and cisplatin was omited last dose due to 'weakness'      09/13/2015 Imaging    Significant interval treatment response. Pulmonary and liver metastases have significantly decreased in size. Thoracic adenopathy has decreased in size. Mixed attenuation mass encompassing the uterus and bilateral adnexal regions has significantly decreased in size. No residual visualized peritoneal tumor implants. No new or progressive metastatic disease. 2. No evidence of bowel obstruction or acute bowel inflammation status post diverting sigmoid colostomy. 3. Additional findings include mild aortic atherosclerosis and Stable mild chronic superior T12 compression fracture.      12/14/2015 Imaging    Stable to improved interval exam. Multiple bilateral pulmonary nodules are stable in size to decreased and the liver lesions are stable to decreased. Mixed attenuation lesion encompassing the uterus and adnexal region shows decrease in size. There is no evidence for new or progressive disease on today's exam.      05/15/2016 Imaging    Findings today reflecting  mixed interval response to therapy. 2. There has been mild increase in size of pulmonary nodules. 3. Interval  increase in size of pelvic mass 4. Further regression of liver metastases 5. There is a new soft tissue nodule within the right lower quadrant peritoneal cavity. Suspicious peritoneal disease      06/07/2016 -  Chemotherapy    She received chemo with cisplatin and gemzar      06/14/2016 Adverse Reaction    Cycle 1, day 8 is put on hold due to severe pancytopenia      08/29/2016 Imaging    Ct scan of chest, abdomen and pelvis: 1. General improvement, with reduced size of the scattered pulmonary nodules, and also a much more normal contour and appearance of the uterus, without the visible enhancing irregular mass shown on the 05/15/2016 exam involving the uterus and cervix. There still some mild soft tissue prominence of the cervix, and a cystic lesion of the left ovary as noted above. No pathologic adenopathy. 2. The right lower quadrant omental nodule has considerably improved and nearly resolved. 3. The prior hepatic metastatic lesions have essentially resolved, with only minimal residual linear hypodensity in segment 4 at the site of the prior large metastatic lesion shown on 05/15/2015.  4. Other imaging findings of potential clinical significance: Left lower quadrant colostomy. Chronic superior endplate compression at T11. Bony demineralization. Trace free pelvic fluid in the cul-de-sac. Central disc protrusion at L3-4. Left mid kidney hypodense lesion is likely a cyst.      12/12/2016 Imaging    1. Suspected minimal enlargement of at least 1 pulmonary nodule. The majority of pulmonary nodules are similar. Cannot exclude minimal pulmonary metastasis progression. 2. No evidence of extrathoracic progressive metastatic disease. Similar soft tissue fullness within the uterine cervix and similar to slight decrease in size of a tiny omental nodule. 3. Aortic Atherosclerosis (ICD10-I70.0). 4. Chronic T10 compression deformity      03/13/2017 Imaging    1. Interval progression of pre-existing  pulmonary metastases with interval development of new pulmonary metastatic lesions. 2. Interval development of new and progression of pre-existing omental metastases. 3. Marked interval progression of cervical disease with dilated fluid-filled endometrial canal in the uterine fundus. The cervical disease infiltrates into the lower uterine segment and likely extends into the adnexal space bilaterally. Posterior extent of cervical disease is tethered to the distal sigmoid colon in this patient with a sigmoid loop colostomy. 4. Stable atrophy of the medial segment left liver.      03/22/2017 -  Chemotherapy    The patient had chemo cisplatini and gemzar        INTERVAL HISTORY: Please see below for problem oriented charting. She returns for cycle 2 of treatment She complained of fatigue, mild nausea but stable peripheral neuropathy She has some concern about some minor bleeding when she wipes after going to the bathroom She is not certain whether it comes from the urine or the vagina She had mild discomfort in the suprapubic region Denies changes in bowel habits No recent fever or chills  REVIEW OF SYSTEMS:   Constitutional: Denies fevers, chills or abnormal weight loss Eyes: Denies blurriness of vision Ears, nose, mouth, throat, and face: Denies mucositis or sore throat Respiratory: Denies cough, dyspnea or wheezes Cardiovascular: Denies palpitation, chest discomfort or lower extremity swelling Gastrointestinal:  Denies nausea, heartburn or change in bowel habits Skin: Denies abnormal skin rashes Lymphatics: Denies new lymphadenopathy or easy bruising Neurological:Denies numbness, tingling or new weaknesses Behavioral/Psych: Mood is  stable, no new changes  All other systems were reviewed with the patient and are negative.  I have reviewed the past medical history, past surgical history, social history and family history with the patient and they are unchanged from previous  note.  ALLERGIES:  has No Known Allergies.  MEDICATIONS:  Current Outpatient Medications  Medication Sig Dispense Refill  . cholecalciferol (VITAMIN D) 1000 units tablet Take 1,000 Units by mouth daily.    Marland Kitchen lidocaine-prilocaine (EMLA) cream Apply 1 application topically as needed. 30 g 6  . omeprazole (PRILOSEC) 20 MG capsule TAKE 1 CAPSULE BY MOUTH EVERY DAY 90 capsule 0  . ondansetron (ZOFRAN) 8 MG tablet Take 1 tablet (8 mg total) by mouth every 8 (eight) hours as needed for nausea. 30 tablet 3  . prochlorperazine (COMPAZINE) 10 MG tablet Take 1 tablet (10 mg total) by mouth every 6 (six) hours as needed for nausea or vomiting. 30 tablet 1  . temazepam (RESTORIL) 15 MG capsule Take 15 mg by mouth at bedtime as needed for sleep.     No current facility-administered medications for this visit.    Facility-Administered Medications Ordered in Other Visits  Medication Dose Route Frequency Provider Last Rate Last Dose  . heparin lock flush 100 unit/mL  500 Units Intravenous Once Livesay, Lennis P, MD      . sodium chloride flush (NS) 0.9 % injection 10 mL  10 mL Intravenous PRN Livesay, Lennis P, MD      . sodium chloride flush (NS) 0.9 % injection 10 mL  10 mL Intracatheter PRN Alvy Bimler, Michaelpaul Apo, MD   10 mL at 03/29/17 1658    PHYSICAL EXAMINATION: ECOG PERFORMANCE STATUS: 1 - Symptomatic but completely ambulatory  Vitals:   04/12/17 0923  BP: 112/71  Pulse: 74  Resp: 18  Temp: 98.4 F (36.9 C)  SpO2: 100%   Filed Weights   04/12/17 0923  Weight: 173 lb 9.6 oz (78.7 kg)    GENERAL:alert, no distress and comfortable SKIN: skin color, texture, turgor are normal, no rashes or significant lesions EYES: normal, Conjunctiva are pink and non-injected, sclera clear OROPHARYNX:no exudate, no erythema and lips, buccal mucosa, and tongue normal  NECK: supple, thyroid normal size, non-tender, without nodularity LYMPH:  no palpable lymphadenopathy in the cervical, axillary or  inguinal LUNGS: clear to auscultation and percussion with normal breathing effort HEART: regular rate & rhythm and no murmurs and no lower extremity edema ABDOMEN:abdomen soft, non-tender and normal bowel sounds Musculoskeletal:no cyanosis of digits and no clubbing  NEURO: alert & oriented x 3 with fluent speech, no focal motor/sensory deficits  LABORATORY DATA:  I have reviewed the data as listed    Component Value Date/Time   NA 142 04/12/2017 0836   NA 142 12/12/2016 0745   K 3.9 04/12/2017 0836   K 4.5 12/12/2016 0745   CL 109 04/12/2017 0836   CO2 25 04/12/2017 0836   CO2 24 12/12/2016 0745   GLUCOSE 85 04/12/2017 0836   GLUCOSE 93 12/12/2016 0745   BUN 16 04/12/2017 0836   BUN 13.8 12/12/2016 0745   CREATININE 1.16 (H) 04/12/2017 0836   CREATININE 1.1 12/12/2016 0745   CALCIUM 8.7 04/12/2017 0836   CALCIUM 9.3 12/12/2016 0745   PROT 6.8 04/12/2017 0836   PROT 6.9 12/12/2016 0745   ALBUMIN 3.6 04/12/2017 0836   ALBUMIN 3.6 12/12/2016 0745   AST 16 04/12/2017 0836   AST 15 12/12/2016 0745   ALT 9 04/12/2017 0836   ALT 10  12/12/2016 0745   ALKPHOS 98 04/12/2017 0836   ALKPHOS 97 12/12/2016 0745   BILITOT 0.3 04/12/2017 0836   BILITOT 0.34 12/12/2016 0745   GFRNONAA 46 (L) 04/12/2017 0836   GFRAA 54 (L) 04/12/2017 0836    No results found for: SPEP, UPEP  Lab Results  Component Value Date   WBC 2.0 (L) 04/12/2017   NEUTROABS 0.6 (L) 04/12/2017   HGB 9.2 (L) 04/12/2017   HCT 27.3 (L) 04/12/2017   MCV 87.0 04/12/2017   PLT 208 04/12/2017      Chemistry      Component Value Date/Time   NA 142 04/12/2017 0836   NA 142 12/12/2016 0745   K 3.9 04/12/2017 0836   K 4.5 12/12/2016 0745   CL 109 04/12/2017 0836   CO2 25 04/12/2017 0836   CO2 24 12/12/2016 0745   BUN 16 04/12/2017 0836   BUN 13.8 12/12/2016 0745   CREATININE 1.16 (H) 04/12/2017 0836   CREATININE 1.1 12/12/2016 0745      Component Value Date/Time   CALCIUM 8.7 04/12/2017 0836   CALCIUM 9.3  12/12/2016 0745   ALKPHOS 98 04/12/2017 0836   ALKPHOS 97 12/12/2016 0745   AST 16 04/12/2017 0836   AST 15 12/12/2016 0745   ALT 9 04/12/2017 0836   ALT 10 12/12/2016 0745   BILITOT 0.3 04/12/2017 0836   BILITOT 0.34 12/12/2016 0745       RADIOGRAPHIC STUDIES: I have personally reviewed the radiological images as listed and agreed with the findings in the report. Ct Chest W Contrast  Result Date: 03/13/2017 CLINICAL DATA:  Cervical cancer metastatic to lung and liver. EXAM: CT CHEST, ABDOMEN, AND PELVIS WITH CONTRAST TECHNIQUE: Multidetector CT imaging of the chest, abdomen and pelvis was performed following the standard protocol during bolus administration of intravenous contrast. CONTRAST:  139mL ISOVUE-300 IOPAMIDOL (ISOVUE-300) INJECTION 61% COMPARISON:  12/12/2016 FINDINGS: CT CHEST FINDINGS Cardiovascular: The heart size is normal. No pericardial effusion. No thoracic aortic aneurysm. Right Port-A-Cath tip is positioned in the distal SVC. Mediastinum/Nodes: No mediastinal lymphadenopathy. There is no hilar lymphadenopathy. The esophagus has normal imaging features. There is no axillary lymphadenopathy. Lungs/Pleura: Interval development of multiple new pulmonary nodules. As examples, 4 mm left upper lobe nodule (image 32 series 7), 4 mm right lower lobe perifissural nodule (image 62), and 5 mm right lower lobe nodule (image 92) are new in the interval. Other nodules have progressed since the prior study. 5 mm right lower lobe nodule measured on the previous study has increased to 12 mm on image 91 of series 7 today. 17 x 15 left parahilar nodule on image 74 has progressed in the interval. 6 mm left upper lobe nodule measured previously is now 9 mm on image 50 of series 7. 11 mm right lower lobe nodule on image 112 was 5 mm previously. Musculoskeletal: Sclerotic lesion left scapula is similar to prior. CT ABDOMEN PELVIS FINDINGS Hepatobiliary: No focal abnormality within the liver parenchyma.  Atrophy of the medial segment left liver is stable. No intrahepatic or extrahepatic biliary dilation. Pancreas: No focal mass lesion. No dilatation of the main duct. No intraparenchymal cyst. No peripancreatic edema. Spleen: No splenomegaly. No focal mass lesion. Adrenals/Urinary Tract: No adrenal nodule or mass. Similar appearance 11 mm probable cyst in the lower pole the left kidney. Right kidney unremarkable. The urinary bladder appears normal for the degree of distention. 16 mm rim enhancing nodule involving the inferior bladder wall, near the origin of the urethra has  progressed from 5 mm previously. Stomach/Bowel: Stomach is nondistended. No gastric wall thickening. No evidence of outlet obstruction. Duodenum is normally positioned as is the ligament of Treitz. No small bowel wall thickening. No small bowel dilatation. The terminal ileum is normal. The appendix is not visualized, but there is no edema or inflammation in the region of the cecum. Sigmoid loop colostomy noted. Vascular/Lymphatic: No abdominal aortic aneurysm. No abdominal lymphadenopathy. No pelvic sidewall lymphadenopathy. Reproductive: Interval enlargement of the uterus and the endometrial cavity in the fundus is become dilated and fluid-filled in the interval since prior study. Heterogeneous enhancement of a cervical soft tissue mass has progressed with infiltration into the lower uterus. The cervical tumor is contiguous with and tethered to the distal sigmoid colon. Probable progressive disease infiltrating into both adnexal regions, right greater than left. Other: No intraperitoneal free fluid. Tiny right omental nodule seen on the prior study has progressed to 17 mm in the interval. There is a new 9 mm omental nodule on image 85. Musculoskeletal: Bone windows reveal no worrisome lytic or sclerotic osseous lesions. IMPRESSION: 1. Interval progression of pre-existing pulmonary metastases with interval development of new pulmonary metastatic  lesions. 2. Interval development of new and progression of pre-existing omental metastases. 3. Marked interval progression of cervical disease with dilated fluid-filled endometrial canal in the uterine fundus. The cervical disease infiltrates into the lower uterine segment and likely extends into the adnexal space bilaterally. Posterior extent of cervical disease is tethered to the distal sigmoid colon in this patient with a sigmoid loop colostomy. 4. Stable atrophy of the medial segment left liver. Electronically Signed   By: Misty Stanley M.D.   On: 03/13/2017 15:25   Ct Abdomen Pelvis W Contrast  Result Date: 03/13/2017 CLINICAL DATA:  Cervical cancer metastatic to lung and liver. EXAM: CT CHEST, ABDOMEN, AND PELVIS WITH CONTRAST TECHNIQUE: Multidetector CT imaging of the chest, abdomen and pelvis was performed following the standard protocol during bolus administration of intravenous contrast. CONTRAST:  118mL ISOVUE-300 IOPAMIDOL (ISOVUE-300) INJECTION 61% COMPARISON:  12/12/2016 FINDINGS: CT CHEST FINDINGS Cardiovascular: The heart size is normal. No pericardial effusion. No thoracic aortic aneurysm. Right Port-A-Cath tip is positioned in the distal SVC. Mediastinum/Nodes: No mediastinal lymphadenopathy. There is no hilar lymphadenopathy. The esophagus has normal imaging features. There is no axillary lymphadenopathy. Lungs/Pleura: Interval development of multiple new pulmonary nodules. As examples, 4 mm left upper lobe nodule (image 32 series 7), 4 mm right lower lobe perifissural nodule (image 62), and 5 mm right lower lobe nodule (image 92) are new in the interval. Other nodules have progressed since the prior study. 5 mm right lower lobe nodule measured on the previous study has increased to 12 mm on image 91 of series 7 today. 17 x 15 left parahilar nodule on image 74 has progressed in the interval. 6 mm left upper lobe nodule measured previously is now 9 mm on image 50 of series 7. 11 mm right lower  lobe nodule on image 112 was 5 mm previously. Musculoskeletal: Sclerotic lesion left scapula is similar to prior. CT ABDOMEN PELVIS FINDINGS Hepatobiliary: No focal abnormality within the liver parenchyma. Atrophy of the medial segment left liver is stable. No intrahepatic or extrahepatic biliary dilation. Pancreas: No focal mass lesion. No dilatation of the main duct. No intraparenchymal cyst. No peripancreatic edema. Spleen: No splenomegaly. No focal mass lesion. Adrenals/Urinary Tract: No adrenal nodule or mass. Similar appearance 11 mm probable cyst in the lower pole the left kidney. Right kidney unremarkable.  The urinary bladder appears normal for the degree of distention. 16 mm rim enhancing nodule involving the inferior bladder wall, near the origin of the urethra has progressed from 5 mm previously. Stomach/Bowel: Stomach is nondistended. No gastric wall thickening. No evidence of outlet obstruction. Duodenum is normally positioned as is the ligament of Treitz. No small bowel wall thickening. No small bowel dilatation. The terminal ileum is normal. The appendix is not visualized, but there is no edema or inflammation in the region of the cecum. Sigmoid loop colostomy noted. Vascular/Lymphatic: No abdominal aortic aneurysm. No abdominal lymphadenopathy. No pelvic sidewall lymphadenopathy. Reproductive: Interval enlargement of the uterus and the endometrial cavity in the fundus is become dilated and fluid-filled in the interval since prior study. Heterogeneous enhancement of a cervical soft tissue mass has progressed with infiltration into the lower uterus. The cervical tumor is contiguous with and tethered to the distal sigmoid colon. Probable progressive disease infiltrating into both adnexal regions, right greater than left. Other: No intraperitoneal free fluid. Tiny right omental nodule seen on the prior study has progressed to 17 mm in the interval. There is a new 9 mm omental nodule on image 85.  Musculoskeletal: Bone windows reveal no worrisome lytic or sclerotic osseous lesions. IMPRESSION: 1. Interval progression of pre-existing pulmonary metastases with interval development of new pulmonary metastatic lesions. 2. Interval development of new and progression of pre-existing omental metastases. 3. Marked interval progression of cervical disease with dilated fluid-filled endometrial canal in the uterine fundus. The cervical disease infiltrates into the lower uterine segment and likely extends into the adnexal space bilaterally. Posterior extent of cervical disease is tethered to the distal sigmoid colon in this patient with a sigmoid loop colostomy. 4. Stable atrophy of the medial segment left liver. Electronically Signed   By: Misty Stanley M.D.   On: 03/13/2017 15:25    ASSESSMENT & PLAN:  Cervical cancer ([place]) Unfortunately, she has developed severe pancytopenia again despite significant dose adjustment I will cancel and delay her treatment I discussed with her the risks, benefits, side effects of the addition of G-CSF support and she agreed for future cycle I would reschedule her appointment I will bring her back the day before but her treatment to get her labs done next week   Chronic kidney disease, stage III (moderate) She will continue reduced dose cisplatin, currently stable  Malignant neoplasm metastatic to left lung [facility]) She denies significant cough or symptoms.  Monitor only  Dysuria She has some mild symptom and bleeding She cannot tell whether the bleeding comes from the urine or vagina I recommend urinalysis and urine culture to exclude UTI and she agreed  Pancytopenia, acquired ([place]) She is not symptomatic She does not need blood transfusion I would add G-CSF for future treatment and she agreed   Orders Placed This Encounter  Procedures  . Urine Culture    Standing Status:   Future    Number of Occurrences:   1    Standing Expiration Date:   05/17/2018  .  Urinalysis, Microscopic - [facility]    Standing Status:   Future    Number of Occurrences:   1    Standing Expiration Date:   05/17/2018   All questions were answered. The patient knows to call the clinic with any problems, questions or concerns. No barriers to learning was detected. I spent 25 minutes counseling the patient face to face. The total time spent in the appointment was 40 minutes and more than 50% was on  counseling and review of test results     Heath Lark, MD 04/12/2017 10:12 AM

## 2017-04-12 NOTE — Patient Instructions (Signed)
Implanted Port Home Guide An implanted port is a type of central line that is placed under the skin. Central lines are used to provide IV access when treatment or nutrition needs to be given through a person's veins. Implanted ports are used for long-term IV access. An implanted port may be placed because:  You need IV medicine that would be irritating to the small veins in your hands or arms.  You need long-term IV medicines, such as antibiotics.  You need IV nutrition for a long period.  You need frequent blood draws for lab tests.  You need dialysis.  Implanted ports are usually placed in the chest area, but they can also be placed in the upper arm, the abdomen, or the leg. An implanted port has two main parts:  Reservoir. The reservoir is round and will appear as a small, raised area under your skin. The reservoir is the part where a needle is inserted to give medicines or draw blood.  Catheter. The catheter is a thin, flexible tube that extends from the reservoir. The catheter is placed into a large vein. Medicine that is inserted into the reservoir goes into the catheter and then into the vein.  How will I care for my incision site? Do not get the incision site wet. Bathe or shower as directed by your health care provider. How is my port accessed? Special steps must be taken to access the port:  Before the port is accessed, a numbing cream can be placed on the skin. This helps numb the skin over the port site.  Your health care provider uses a sterile technique to access the port. ? Your health care provider must put on a mask and sterile gloves. ? The skin over your port is cleaned carefully with an antiseptic and allowed to dry. ? The port is gently pinched between sterile gloves, and a needle is inserted into the port.  Only "non-coring" port needles should be used to access the port. Once the port is accessed, a blood return should be checked. This helps ensure that the port  is in the vein and is not clogged.  If your port needs to remain accessed for a constant infusion, a clear (transparent) bandage will be placed over the needle site. The bandage and needle will need to be changed every week, or as directed by your health care provider.  Keep the bandage covering the needle clean and dry. Do not get it wet. Follow your health care provider's instructions on how to take a shower or bath while the port is accessed.  If your port does not need to stay accessed, no bandage is needed over the port.  What is flushing? Flushing helps keep the port from getting clogged. Follow your health care provider's instructions on how and when to flush the port. Ports are usually flushed with saline solution or a medicine called heparin. The need for flushing will depend on how the port is used.  If the port is used for intermittent medicines or blood draws, the port will need to be flushed: ? After medicines have been given. ? After blood has been drawn. ? As part of routine maintenance.  If a constant infusion is running, the port may not need to be flushed.  How long will my port stay implanted? The port can stay in for as long as your health care provider thinks it is needed. When it is time for the port to come out, surgery will be   done to remove it. The procedure is similar to the one performed when the port was put in. When should I seek immediate medical care? When you have an implanted port, you should seek immediate medical care if:  You notice a bad smell coming from the incision site.  You have swelling, redness, or drainage at the incision site.  You have more swelling or pain at the port site or the surrounding area.  You have a fever that is not controlled with medicine.  This information is not intended to replace advice given to you by your health care provider. Make sure you discuss any questions you have with your health care provider. Document  Released: 02/19/2005 Document Revised: 07/28/2015 Document Reviewed: 10/27/2012 Elsevier Interactive Patient Education  2017 Elsevier Inc.  

## 2017-04-12 NOTE — Telephone Encounter (Signed)
Gave patient AVs and calendar of upcoming February and March appointments.  °

## 2017-04-12 NOTE — Assessment & Plan Note (Addendum)
Unfortunately, she has developed severe pancytopenia again despite significant dose adjustment I will cancel and delay her treatment I discussed with her the risks, benefits, side effects of the addition of G-CSF support and she agreed for future cycle I will try to get insurance prior authorization I would reschedule her appointment I will bring her back the day before but her treatment to get her labs done next week

## 2017-04-12 NOTE — Assessment & Plan Note (Signed)
She will continue reduced dose cisplatin, currently stable

## 2017-04-12 NOTE — Assessment & Plan Note (Signed)
She denies significant cough or symptoms.  Monitor only

## 2017-04-12 NOTE — Assessment & Plan Note (Signed)
She is not symptomatic She does not need blood transfusion I would add G-CSF for future treatment and she agreed

## 2017-04-13 LAB — URINE CULTURE: CULTURE: NO GROWTH

## 2017-04-15 ENCOUNTER — Telehealth: Payer: Self-pay | Admitting: *Deleted

## 2017-04-15 NOTE — Telephone Encounter (Signed)
-----   Message from Heath Lark, MD sent at 04/15/2017  7:29 AM EST ----- Regarding: urine culture neg She had urine culture last week due to probable UTI. Let her know test was neg

## 2017-04-15 NOTE — Telephone Encounter (Signed)
Notified of results. States she has not seen anymore blood.   Reports "twinges of pain in left side in ovary area".  Not sharp pain.

## 2017-04-17 ENCOUNTER — Telehealth: Payer: Self-pay | Admitting: *Deleted

## 2017-04-17 ENCOUNTER — Other Ambulatory Visit: Payer: Self-pay | Admitting: Hematology and Oncology

## 2017-04-17 NOTE — Telephone Encounter (Signed)
All her appt will need to be redone She will need 7 hours chemo next Friday. I will start with that first. But before we schedule, she has to agree to resume on 2/22

## 2017-04-17 NOTE — Telephone Encounter (Signed)
Pt states she will not be take chemo on Friday. Just brought husband home from open heart surgery. Has to take him for follow up appts.   Cancelled lab on Thursday and chemo on Friday.  She wants to know how to proceed.

## 2017-04-18 ENCOUNTER — Telehealth: Payer: Self-pay | Admitting: *Deleted

## 2017-04-18 ENCOUNTER — Other Ambulatory Visit: Payer: Medicare HMO

## 2017-04-18 ENCOUNTER — Telehealth: Payer: Self-pay | Admitting: Hematology and Oncology

## 2017-04-18 ENCOUNTER — Telehealth: Payer: Self-pay

## 2017-04-18 NOTE — Telephone Encounter (Addendum)
Per Melissa APP I have scheduled the patient to see Dr. Skeet Latch on February 28th at 12pm. [facility] and spoke with the patient and gave the appt information. Patient stated that "Dr. Alvy Bimler told me if I was bleeding I can't have the chemotherapy." Patient also stated that "this morning I felt a bubble like and got up to go to the bathroom, I thought I had peed on myself. It was blood and it went through my underwear and pajamas. After that I started wearing a pad. Nothing on the pad since then or when I wipe."  Message routed to Dr. Elson Areas and Lenna Sciara APP. Explained to the patient I will schedule the appt and call her tomorrow after speaking with Dr. Alvy Bimler.

## 2017-04-18 NOTE — Telephone Encounter (Signed)
Spoke to patient regarding upcoming February and March appointments.

## 2017-04-18 NOTE — Telephone Encounter (Signed)
Spoke with pt by phone.  Pt called reporting incident of bright red vaginal bleeding this morning.  Information given to Dr Alvy Bimler and Joylene John, NP

## 2017-04-18 NOTE — Telephone Encounter (Signed)
Patient was complaining of left ovarian pain

## 2017-04-19 ENCOUNTER — Other Ambulatory Visit: Payer: Medicare HMO

## 2017-04-19 ENCOUNTER — Ambulatory Visit: Payer: Medicare HMO

## 2017-04-19 ENCOUNTER — Other Ambulatory Visit: Payer: Self-pay | Admitting: Hematology and Oncology

## 2017-04-19 DIAGNOSIS — N322 Vesical fistula, not elsewhere classified: Secondary | ICD-10-CM | POA: Insufficient documentation

## 2017-04-19 DIAGNOSIS — N939 Abnormal uterine and vaginal bleeding, unspecified: Secondary | ICD-10-CM

## 2017-04-22 ENCOUNTER — Telehealth: Payer: Self-pay | Admitting: *Deleted

## 2017-04-22 NOTE — Telephone Encounter (Addendum)
Called pt this am to give info regarding change in appts & appt for MRI pelvis for 04/25/17 & to be there @ 4:30 pm.  She expressed understanding & appreciation for call.

## 2017-04-25 ENCOUNTER — Ambulatory Visit (HOSPITAL_COMMUNITY)
Admission: RE | Admit: 2017-04-25 | Discharge: 2017-04-25 | Disposition: A | Discharge status: Home / Self Care | Payer: Medicare HMO | Source: Ambulatory Visit | Attending: Hematology and Oncology | Admitting: Hematology and Oncology

## 2017-04-25 DIAGNOSIS — N939 Abnormal uterine and vaginal bleeding, unspecified: Secondary | ICD-10-CM | POA: Diagnosis present

## 2017-04-25 DIAGNOSIS — N858 Other specified noninflammatory disorders of uterus: Secondary | ICD-10-CM | POA: Diagnosis not present

## 2017-04-25 DIAGNOSIS — N329 Bladder disorder, unspecified: Secondary | ICD-10-CM | POA: Insufficient documentation

## 2017-04-25 DIAGNOSIS — N2889 Other specified disorders of kidney and ureter: Secondary | ICD-10-CM | POA: Diagnosis not present

## 2017-04-25 MED ORDER — GADOBENATE DIMEGLUMINE 529 MG/ML IV SOLN
20.0000 mL | Freq: Once | INTRAVENOUS | Status: AC | PRN
  Administered 2017-04-25: 16 mL via INTRAVENOUS

## 2017-04-26 ENCOUNTER — Other Ambulatory Visit: Payer: Medicare HMO

## 2017-04-26 ENCOUNTER — Ambulatory Visit: Payer: Medicare HMO

## 2017-04-29 ENCOUNTER — Ambulatory Visit: Payer: Medicare HMO

## 2017-05-01 ENCOUNTER — Other Ambulatory Visit: Payer: Medicare HMO

## 2017-05-01 ENCOUNTER — Ambulatory Visit: Payer: Medicare HMO

## 2017-05-02 ENCOUNTER — Encounter: Payer: Self-pay | Admitting: Gynecologic Oncology

## 2017-05-02 ENCOUNTER — Inpatient Hospital Stay (HOSPITAL_BASED_OUTPATIENT_CLINIC_OR_DEPARTMENT_OTHER): Payer: Medicare HMO | Admitting: Gynecologic Oncology

## 2017-05-02 VITALS — BP 127/73 | HR 78 | Temp 98.7°F | Resp 20 | Wt 170.6 lb

## 2017-05-02 DIAGNOSIS — D61818 Other pancytopenia: Secondary | ICD-10-CM | POA: Diagnosis not present

## 2017-05-02 DIAGNOSIS — R3915 Urgency of urination: Secondary | ICD-10-CM | POA: Diagnosis not present

## 2017-05-02 DIAGNOSIS — C787 Secondary malignant neoplasm of liver and intrahepatic bile duct: Secondary | ICD-10-CM | POA: Diagnosis not present

## 2017-05-02 DIAGNOSIS — C539 Malignant neoplasm of cervix uteri, unspecified: Secondary | ICD-10-CM

## 2017-05-02 DIAGNOSIS — C7802 Secondary malignant neoplasm of left lung: Secondary | ICD-10-CM | POA: Diagnosis not present

## 2017-05-02 DIAGNOSIS — N183 Chronic kidney disease, stage 3 (moderate): Secondary | ICD-10-CM | POA: Diagnosis not present

## 2017-05-02 DIAGNOSIS — C786 Secondary malignant neoplasm of retroperitoneum and peritoneum: Secondary | ICD-10-CM | POA: Diagnosis not present

## 2017-05-02 DIAGNOSIS — R300 Dysuria: Secondary | ICD-10-CM | POA: Diagnosis not present

## 2017-05-02 DIAGNOSIS — Z79899 Other long term (current) drug therapy: Secondary | ICD-10-CM | POA: Diagnosis not present

## 2017-05-02 DIAGNOSIS — Z933 Colostomy status: Secondary | ICD-10-CM | POA: Diagnosis not present

## 2017-05-02 MED ORDER — OXYBUTYNIN CHLORIDE ER 5 MG PO TB24: 5.0000 mg | ORAL_TABLET | Freq: Every day | ORAL | 0 refills | Status: DC

## 2017-05-02 NOTE — Patient Instructions (Addendum)
We will call you with the results of your pap smear from today.  You can begin taking Ditropan XL 5 mg at bedtime to help with your urinary urgency symptoms.  Plan to follow up with Dr. Alvy Bimler as scheduled or sooner if needed.  Please call for any questions or concerns.  Oxybutynin extended-release tablets What is this medicine? OXYBUTYNIN (ox i BYOO ti nin) is used to treat overactive bladder. This medicine reduces the amount of bathroom visits. It may also help to control wetting accidents. This medicine may be used for other purposes; ask your health care provider or pharmacist if you have questions. COMMON BRAND NAME(S): Ditropan XL What should I tell my health care provider before I take this medicine? They need to know if you have any of these conditions: -autonomic neuropathy -dementia -difficulty passing urine -glaucoma -intestinal obstruction -kidney disease -liver disease -myasthenia gravis -Parkinson's disease -an unusual or allergic reaction to oxybutynin, other medicines, foods, dyes, or preservatives -pregnant or trying to get pregnant -breast-feeding How should I use this medicine? Take this medicine by mouth with a glass of water. Swallow whole, do not crush, cut, or chew. Follow the directions on the prescription label. You can take this medicine with or without food. Take your doses at regular intervals. Do not take your medicine more often than directed. Talk to your pediatrician regarding the use of this medicine in children. Special care may be needed. While this drug may be prescribed for children as young as 6 years for selected conditions, precautions do apply. Overdosage: If you think you have taken too much of this medicine contact a poison control center or emergency room at once. NOTE: This medicine is only for you. Do not share this medicine with others. What if I miss a dose? If you miss a dose, take it as soon as you can. If it is almost time for your next  dose, take only that dose. Do not take double or extra doses. What may interact with this medicine? -antihistamines for allergy, cough and cold -atropine -certain medicines for bladder problems like oxybutynin, tolterodine -certain medicines for Parkinson's disease like benztropine, trihexyphenidyl -certain medicines for stomach problems like dicyclomine, hyoscyamine -certain medicines for travel sickness like scopolamine -clarithromycin -erythromycin -ipratropium -medicines for fungal infections, like fluconazole, itraconazole, ketoconazole or voriconazole This list may not describe all possible interactions. Give your health care provider a list of all the medicines, herbs, non-prescription drugs, or dietary supplements you use. Also tell them if you smoke, drink alcohol, or use illegal drugs. Some items may interact with your medicine. What should I watch for while using this medicine? It may take a few weeks to notice the full benefit from this medicine. You may need to limit your intake tea, coffee, caffeinated sodas, and alcohol. These drinks may make your symptoms worse. You may get drowsy or dizzy. Do not drive, use machinery, or do anything that needs mental alertness until you know how this medicine affects you. Do not stand or sit up quickly, especially if you are an older patient. This reduces the risk of dizzy or fainting spells. Alcohol may interfere with the effect of this medicine. Avoid alcoholic drinks. Your mouth may get dry. Chewing sugarless gum or sucking hard candy, and drinking plenty of water may help. Contact your doctor if the problem does not go away or is severe. This medicine may cause dry eyes and blurred vision. If you wear contact lenses, you may feel some discomfort. Lubricating drops  may help. See your eyecare professional if the problem does not go away or is severe. You may notice the shells of the tablets in your stool from time to time. This is normal. Avoid  extreme heat. This medicine can cause you to sweat less than normal. Your body temperature could increase to dangerous levels, which may lead to heat stroke. What side effects may I notice from receiving this medicine? Side effects that you should report to your doctor or health care professional as soon as possible: -allergic reactions like skin rash, itching or hives, swelling of the face, lips, or tongue -agitation -breathing problems -confusion -fever -flushing (reddening of the skin) -hallucinations -memory loss -pain or difficulty passing urine -palpitations -unusually weak or tired Side effects that usually do not require medical attention (report to your doctor or health care professional if they continue or are bothersome): -constipation -headache -sexual difficulties (impotence) This list may not describe all possible side effects. Call your doctor for medical advice about side effects. You may report side effects to FDA at 1-800-FDA-1088. Where should I keep my medicine? Keep out of the reach of children. Store at room temperature between 15 and 30 degrees C (59 and 86 degrees F). Protect from moisture and humidity. Throw away any unused medicine after the expiration date. NOTE: This sheet is a summary. It may not cover all possible information. If you have questions about this medicine, talk to your doctor, pharmacist, or health care provider.  2018 Elsevier/Gold Standard (2013-05-07 10:57:06)

## 2017-05-02 NOTE — Progress Notes (Signed)
GYN ONCOLOGY OFFICE VISIT    Katherine Boone 72 y.o. female  Chief Complaint  Patient presents with  . Malignant neoplasm of cervix, unspecified site [facility])  vaginal bleeding  Assessment/Plan   Advance gynecologic cancer histologically consistent with cervical cancer although the entire presentation is not necessarily consistent with cervix cancer. She has had a partial response to carboplatin and Taxol and  Avastin.  Currently on CDDP gemzar. Residual disease in the pelvis on examination.  Presents with a complaint of vaginal bleeding. A small amount of old dark mucoid material was in the vagina and cervical os.  ECC collected.  Cervical stenosis limited evaluation of the uterus.  No erosions or gross disease in the vagina.  Supportive care. Surgical removal of the cervix and uterus is not a consideration.  However we could consider palliative pelvic XRT if it persists or becomes heavier Close follow-up in 2 months  Urinary incontinence Oxybutynin RX    HPI: 72 year old white married female   Oncology History   PD-L 1 testing neg     Cervical cancer ([place])   04/26/2015 Imaging    Numerous bilateral pulmonary nodules consistent with metastatic disease are associated with liver lesions, omental nodules, mesenteric nodules, necrotic retroperitoneal lymphadenopathy in a mixed cystic and solid mass in the central pelvis involving the uterus extending into the cul-de-sac and both adnexal regions. Uterine primary (likely endometrial) is favored over an ovarian primary malignancy given the lack of ascites, associated intrahepatic metastases and pulmonary involvement.      05/04/2015 Procedure    Technically successful CT-guided core biopsy of left pelvic mass      05/04/2015 Pathology Results    Soft Tissue Needle Core Biopsy, left ext iliac mass METASTATIC HIGH GRADE POORLY DIFFERENTIATED CARCINOMA CONSISTENT WITH CERVICAL ORIGIN Microscopic Comment The neoplasm stains positive for  cervical markers : p16, ER, ck8/18, and p63 (focal), TTF-1 (focal) and negative for synaptophysin, Chromogranin (neuroendocrine markers) and ck5/6. The morphology and immunohistochemistry staining pattern support these cells are GYN cervical origin.Spec      05/10/2015 Imaging    Innumerable (> than 40) pulmonary nodules randomly distributed throughout both lungs, largest 1.1 cm, in keeping with pulmonary metastases. 2. Left supraclavicular and left hilar nodal metastases. 3. Re- demonstration of liver metastases. 4. Right thyroid lobe 1.4 cm pulmonary nodule, for which no further evaluation is recommended. 5. Stable mild superior T11 vertebral compression deformity.      05/12/2015 - 05/12/2015 Chemotherapy    She received 1 dose of carbo/taxol only      05/15/2015 Imaging    Widely metastatic cervical carcinoma with unchanged appearance compared to 04/26/2015. Bulky tumor narrows and likely invades the rectum/distal sigmoid with progressive proximal stool retention      05/15/2015 - 05/23/2015 Hospital Admission    She was admitted for severe bowel obstruction requiring urgent diversion colostomy      05/18/2015 Surgery    She underwent LAPAROSCOPIC DIVERTING SIGMOID LOOP COLOSTOMY for bowel obstruction       06/10/2015 Procedure    Successful placement of a right internal jugular approach power injectable Port-A-Cath. The catheter is ready for immediate use      06/23/2015 - 03/01/2016 Chemotherapy    She received combination treatment with cisplatin, Taxol and Avastin. Avastin was started on 5/11. Dose of Taxol was modified due to neuropathy and cisplatin was omited last dose due to 'weakness'      09/13/2015 Imaging    Significant interval treatment response. Pulmonary and liver metastases  have significantly decreased in size. Thoracic adenopathy has decreased in size. Mixed attenuation mass encompassing the uterus and bilateral adnexal regions has significantly decreased in size. No  residual visualized peritoneal tumor implants. No new or progressive metastatic disease. 2. No evidence of bowel obstruction or acute bowel inflammation status post diverting sigmoid colostomy. 3. Additional findings include mild aortic atherosclerosis and Stable mild chronic superior T12 compression fracture.      12/14/2015 Imaging    Stable to improved interval exam. Multiple bilateral pulmonary nodules are stable in size to decreased and the liver lesions are stable to decreased. Mixed attenuation lesion encompassing the uterus and adnexal region shows decrease in size. There is no evidence for new or progressive disease on today's exam.      05/15/2016 Imaging    Findings today reflecting mixed interval response to therapy. 2. There has been mild increase in size of pulmonary nodules. 3. Interval increase in size of pelvic mass 4. Further regression of liver metastases 5. There is a new soft tissue nodule within the right lower quadrant peritoneal cavity. Suspicious peritoneal disease      06/07/2016 -  Chemotherapy    She received chemo with cisplatin and gemzar      06/14/2016 Adverse Reaction    Cycle 1, day 8 is put on hold due to severe pancytopenia      08/29/2016 Imaging    Ct scan of chest, abdomen and pelvis: 1. General improvement, with reduced size of the scattered pulmonary nodules, and also a much more normal contour and appearance of the uterus, without the visible enhancing irregular mass shown on the 05/15/2016 exam involving the uterus and cervix. There still some mild soft tissue prominence of the cervix, and a cystic lesion of the left ovary as noted above. No pathologic adenopathy. 2. The right lower quadrant omental nodule has considerably improved and nearly resolved. 3. The prior hepatic metastatic lesions have essentially resolved, with only minimal residual linear hypodensity in segment 4 at the site of the prior large metastatic lesion shown on 05/15/2015.  4. Other  imaging findings of potential clinical significance: Left lower quadrant colostomy. Chronic superior endplate compression at T11. Bony demineralization. Trace free pelvic fluid in the cul-de-sac. Central disc protrusion at L3-4. Left mid kidney hypodense lesion is likely a cyst.      12/12/2016 Imaging    1. Suspected minimal enlargement of at least 1 pulmonary nodule. The majority of pulmonary nodules are similar. Cannot exclude minimal pulmonary metastasis progression. 2. No evidence of extrathoracic progressive metastatic disease. Similar soft tissue fullness within the uterine cervix and similar to slight decrease in size of a tiny omental nodule. 3. Aortic Atherosclerosis (ICD10-I70.0). 4. Chronic T10 compression deformity      03/13/2017 Imaging    1. Interval progression of pre-existing pulmonary metastases with interval development of new pulmonary metastatic lesions. 2. Interval development of new and progression of pre-existing omental metastases. 3. Marked interval progression of cervical disease with dilated fluid-filled endometrial canal in the uterine fundus. The cervical disease infiltrates into the lower uterine segment and likely extends into the adnexal space bilaterally. Posterior extent of cervical disease is tethered to the distal sigmoid colon in this patient with a sigmoid loop colostomy. 4. Stable atrophy of the medial segment left liver.      03/22/2017 -  Chemotherapy    The patient had chemo cisplatini and gemzar       04/26/2017 Imaging    1. Bladder wall irregularity anteriorly, suspicious  for cystitis. No specific evidence of fistula to uterus or vagina. Voiding cystogram would likely be of greatest sensitivity. 2. Similar to slight decrease in size of infiltrative mass centered about the lower uterine segment. Presumed sigmoid colonic wall involvement.      Reports episodes of vaginal bleeding.  C/O urinary urgency and incontinence.  Feels well, good appetite,  no emesis, ostomy is functioning well.  Is anxious to not delay chemotherapy.   Review of Systems: 10 point review of systems is negative except as noted in interval history.   Vitals: Blood pressure 127/73, pulse 78, temperature 98.7 F (37.1 C), temperature source Oral, resp. rate 20, weight 170 lb 9.6 oz (77.4 kg), SpO2 100 %.  Physical Exam: General : The patient is a healthy woman in no acute distress.  HEENT: normocephalic, extraoccular movements normal; neck is supple without thyromegally  Lynphnodes: Supraclavicular and inguinal nodes not enlarged  Abdomen: Soft, non-tender, no ascites, no organomegally, no masses, no hernias.  Ostomy functional. Pelvic:  EGBUS: Normal EGBUS, trace old blood in the vaginal vault.  No gross lesions, cervix without discrete masses,  Dark mucoid material in the cervix.  ECC collected.  Nodularity/fullness  of the left pelvis.   Urethra and Bladder: Normal, non-tender  Uterus: retroverted, fixed  Lower extremities: No edema or varicosities. Normal range of motion      No Known Allergies  Past Medical History:  Diagnosis Date  . Cancer (HCC)    Cervical  . Family history of cancer    stomach, colon, lung, throat  . GERD (gastroesophageal reflux disease)    diet controlled  . Gout 03/2005   ?  Marland Kitchen Large bowel obstruction ([place]) 05/28/2015  . Migraines    none recently  . Osteoporosis 02/2011   DEXA 12/12 - Lspine -2.6; femur -3.8  . Small bowel obstruction [facility])     Past Surgical History:  Procedure Laterality Date  . APPENDECTOMY  1960  . LAPAROSCOPIC DIVERTED COLOSTOMY N/A 05/18/2015   Procedure: LAPAROSCOPIC DIVERTED LOOP COLOSTOMY ;  Surgeon: Leighton Ruff, MD;  Location: [facility] ORS;  Service: General;  Laterality: N/A;      Social History   Socioeconomic History  . Marital status: Divorced    Spouse name: Not on file  . Number of children: 2  . Years of education: Not on file  . Highest education level: Not on file  Social  Needs  . Financial resource strain: Not on file  . Food insecurity - worry: Not on file  . Food insecurity - inability: Not on file  . Transportation needs - medical: Not on file  . Transportation needs - non-medical: Not on file  Occupational History    Employer: [employer]  Tobacco Use  . Smoking status: Never Smoker  . Smokeless tobacco: Never Used  Substance and Sexual Activity  . Alcohol use: No  . Drug use: No  . Sexual activity: No  Other Topics Concern  . 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 Heart disease Mother   .  Hypertension Mother   . Cancer Mother 21       colon and stomach  . Cancer Brother        stomach and throat, smoker  . Hypertension Sister   . Cancer Brother        stomach and lung, smoker  . COPD Brother   . Cancer Father        stomach cancer  . Ovarian cancer Unknown        maternal neice  . Throat cancer Brother   . Stroke Sister   . Cancer Other        niece - stomach cancer  . Diabetes Neg Hx   . Coronary artery disease Neg Hx       Janie Morning, MD 05/02/2017, 1:16 PM

## 2017-05-07 LAB — CYTOLOGY - PAP

## 2017-05-08 ENCOUNTER — Inpatient Hospital Stay: Payer: Medicare HMO

## 2017-05-08 ENCOUNTER — Telehealth: Payer: Self-pay

## 2017-05-08 ENCOUNTER — Encounter: Payer: Self-pay | Admitting: Hematology and Oncology

## 2017-05-08 ENCOUNTER — Inpatient Hospital Stay (HOSPITAL_BASED_OUTPATIENT_CLINIC_OR_DEPARTMENT_OTHER): Payer: Medicare HMO | Admitting: Hematology and Oncology

## 2017-05-08 ENCOUNTER — Inpatient Hospital Stay: Payer: Medicare HMO | Attending: Hematology and Oncology

## 2017-05-08 ENCOUNTER — Telehealth: Payer: Self-pay | Admitting: Hematology and Oncology

## 2017-05-08 DIAGNOSIS — C7801 Secondary malignant neoplasm of right lung: Secondary | ICD-10-CM

## 2017-05-08 DIAGNOSIS — C787 Secondary malignant neoplasm of liver and intrahepatic bile duct: Secondary | ICD-10-CM | POA: Diagnosis not present

## 2017-05-08 DIAGNOSIS — C786 Secondary malignant neoplasm of retroperitoneum and peritoneum: Secondary | ICD-10-CM | POA: Insufficient documentation

## 2017-05-08 DIAGNOSIS — C539 Malignant neoplasm of cervix uteri, unspecified: Secondary | ICD-10-CM | POA: Diagnosis not present

## 2017-05-08 DIAGNOSIS — D61818 Other pancytopenia: Secondary | ICD-10-CM | POA: Diagnosis not present

## 2017-05-08 DIAGNOSIS — C799 Secondary malignant neoplasm of unspecified site: Secondary | ICD-10-CM

## 2017-05-08 DIAGNOSIS — Z933 Colostomy status: Secondary | ICD-10-CM

## 2017-05-08 DIAGNOSIS — N183 Chronic kidney disease, stage 3 unspecified: Secondary | ICD-10-CM

## 2017-05-08 DIAGNOSIS — C7989 Secondary malignant neoplasm of other specified sites: Secondary | ICD-10-CM

## 2017-05-08 DIAGNOSIS — Z79899 Other long term (current) drug therapy: Secondary | ICD-10-CM | POA: Diagnosis not present

## 2017-05-08 DIAGNOSIS — Z9221 Personal history of antineoplastic chemotherapy: Secondary | ICD-10-CM | POA: Insufficient documentation

## 2017-05-08 DIAGNOSIS — C7802 Secondary malignant neoplasm of left lung: Secondary | ICD-10-CM | POA: Insufficient documentation

## 2017-05-08 LAB — COMPREHENSIVE METABOLIC PANEL
ALT: 12 U/L (ref 0–55)
AST: 17 U/L (ref 5–34)
Albumin: 3.6 g/dL (ref 3.5–5.0)
Alkaline Phosphatase: 115 U/L (ref 40–150)
Anion gap: 7 (ref 3–11)
BILIRUBIN TOTAL: 0.5 mg/dL (ref 0.2–1.2)
BUN: 17 mg/dL (ref 7–26)
CALCIUM: 9.4 mg/dL (ref 8.4–10.4)
CO2: 25 mmol/L (ref 22–29)
CREATININE: 1.25 mg/dL — AB (ref 0.60–1.10)
Chloride: 106 mmol/L (ref 98–109)
GFR, EST AFRICAN AMERICAN: 49 mL/min — AB (ref >60)
GFR, EST NON AFRICAN AMERICAN: 42 mL/min — AB (ref >60)
Glucose, Bld: 103 mg/dL (ref 70–140)
Potassium: 4 mmol/L (ref 3.5–5.1)
Sodium: 138 mmol/L (ref 136–145)
TOTAL PROTEIN: 7.2 g/dL (ref 6.4–8.3)

## 2017-05-08 LAB — CBC WITH DIFFERENTIAL/PLATELET
Basophils Absolute: 0 10*3/uL (ref 0.0–0.1)
Basophils Relative: 0 %
EOS PCT: 3 %
Eosinophils Absolute: 0.2 10*3/uL (ref 0.0–0.5)
HEMATOCRIT: 32.2 % — AB (ref 34.8–46.6)
Hemoglobin: 10.5 g/dL — ABNORMAL LOW (ref 11.6–15.9)
LYMPHS ABS: 1.4 10*3/uL (ref 0.9–3.3)
Lymphocytes Relative: 28 %
MCH: 29.2 pg (ref 25.1–34.0)
MCHC: 32.6 g/dL (ref 31.5–36.0)
MCV: 89.4 fL (ref 79.5–101.0)
MONO ABS: 0.6 10*3/uL (ref 0.1–0.9)
Monocytes Relative: 12 %
NEUTROS ABS: 2.9 10*3/uL (ref 1.5–6.5)
Neutrophils Relative %: 57 %
PLATELETS: 157 10*3/uL (ref 145–400)
RBC: 3.6 MIL/uL — AB (ref 3.70–5.45)
RDW: 15.8 % — ABNORMAL HIGH (ref 11.2–14.5)
WBC: 5 10*3/uL (ref 3.9–10.3)

## 2017-05-08 MED ORDER — PEGFILGRASTIM INJECTION 6 MG/0.6ML ~~LOC~~
6.0000 mg | PREFILLED_SYRINGE | Freq: Once | SUBCUTANEOUS | Status: DC
Start: 2017-05-08 — End: 2017-05-08

## 2017-05-08 MED ORDER — PROCHLORPERAZINE MALEATE 10 MG PO TABS
10.0000 mg | ORAL_TABLET | Freq: Once | ORAL | Status: AC
  Administered 2017-05-08: 10 mg via ORAL

## 2017-05-08 MED ORDER — HEPARIN SOD (PORK) LOCK FLUSH 100 UNIT/ML IV SOLN
500.0000 [IU] | Freq: Once | INTRAVENOUS | Status: AC | PRN
  Administered 2017-05-08: 500 [IU]
  Filled 2017-05-08: qty 5

## 2017-05-08 MED ORDER — SODIUM CHLORIDE 0.9 % IV SOLN
Freq: Once | INTRAVENOUS | Status: AC
  Administered 2017-05-08: 13:00:00 via INTRAVENOUS

## 2017-05-08 MED ORDER — PROCHLORPERAZINE MALEATE 10 MG PO TABS
ORAL_TABLET | ORAL | Status: AC
  Filled 2017-05-08: qty 1

## 2017-05-08 MED ORDER — SODIUM CHLORIDE 0.9 % IV SOLN
500.0000 mg/m2 | Freq: Once | INTRAVENOUS | Status: AC
  Administered 2017-05-08: 950 mg via INTRAVENOUS
  Filled 2017-05-08: qty 24.98

## 2017-05-08 MED ORDER — SODIUM CHLORIDE 0.9% FLUSH
10.0000 mL | Freq: Once | INTRAVENOUS | Status: AC
  Administered 2017-05-08: 10 mL
  Filled 2017-05-08: qty 10

## 2017-05-08 MED ORDER — SODIUM CHLORIDE 0.9% FLUSH
10.0000 mL | INTRAVENOUS | Status: DC | PRN
  Administered 2017-05-08: 10 mL
  Filled 2017-05-08: qty 10

## 2017-05-08 NOTE — Progress Notes (Signed)
[place] OFFICE PROGRESS NOTE  Patient Care Team: [name], MD as PCP - General (Family Medicine) Carmel Sacramento, OD as Consulting Physician (Optometry)  ASSESSMENT & PLAN:  Cervical cancer [facility]) She missed her day 1 treatment last week We will proceed with day 8 today. I will omit day 1 of the cycle.  I will cancel G-CSF support in 2 days Next cycle of treatment will begin approximately 2 weeks from now I would get insurance prior authorization to get G-CSF support with her next cycle  Pancytopenia, acquired [facility]) She has profound pancytopenia despite aggressive dose adjustment We would proceed with treatment today I would get insurance prior authorization to get G-CSF support  Chronic kidney disease, stage III (moderate) She will continue reduced dose cisplatin, currently stable   No orders of the defined types were placed in this encounter.   INTERVAL HISTORY: Please see below for problem oriented charting. She returns for further follow-up She has scant vaginal discharge No further vaginal bleeding Denies recent fever, chills, nausea, dysuria or frequency Her appetite is stable, no recent weight loss Denies recent cough or infection  SUMMARY OF ONCOLOGIC HISTORY: Oncology History   PD-L 1 testing neg     Cervical cancer ([place])   04/26/2015 Imaging    Numerous bilateral pulmonary nodules consistent with metastatic disease are associated with liver lesions, omental nodules, mesenteric nodules, necrotic retroperitoneal lymphadenopathy in a mixed cystic and solid mass in the central pelvis involving the uterus extending into the cul-de-sac and both adnexal regions. Uterine primary (likely endometrial) is favored over an ovarian primary malignancy given the lack of ascites, associated intrahepatic metastases and pulmonary involvement.      05/04/2015 Procedure    Technically successful CT-guided core biopsy of left pelvic mass      05/04/2015  Pathology Results    Soft Tissue Needle Core Biopsy, left ext iliac mass METASTATIC HIGH GRADE POORLY DIFFERENTIATED CARCINOMA CONSISTENT WITH CERVICAL ORIGIN Microscopic Comment The neoplasm stains positive for cervical markers : p16, ER, ck8/18, and p63 (focal), TTF-1 (focal) and negative for synaptophysin, Chromogranin (neuroendocrine markers) and ck5/6. The morphology and immunohistochemistry staining pattern support these cells are GYN cervical origin.Spec      05/10/2015 Imaging    Innumerable (> than 40) pulmonary nodules randomly distributed throughout both lungs, largest 1.1 cm, in keeping with pulmonary metastases. 2. Left supraclavicular and left hilar nodal metastases. 3. Re- demonstration of liver metastases. 4. Right thyroid lobe 1.4 cm pulmonary nodule, for which no further evaluation is recommended. 5. Stable mild superior T11 vertebral compression deformity.      05/12/2015 - 05/12/2015 Chemotherapy    She received 1 dose of carbo/taxol only      05/15/2015 Imaging    Widely metastatic cervical carcinoma with unchanged appearance compared to 04/26/2015. Bulky tumor narrows and likely invades the rectum/distal sigmoid with progressive proximal stool retention      05/15/2015 - 05/23/2015 Hospital Admission    She was admitted for severe bowel obstruction requiring urgent diversion colostomy      05/18/2015 Surgery    She underwent LAPAROSCOPIC DIVERTING SIGMOID LOOP COLOSTOMY for bowel obstruction       06/10/2015 Procedure    Successful placement of a right internal jugular approach power injectable Port-A-Cath. The catheter is ready for immediate use      06/23/2015 - 03/01/2016 Chemotherapy    She received combination treatment with cisplatin, Taxol and Avastin. Avastin was started on 5/11. Dose of Taxol was modified due to  neuropathy and cisplatin was omited last dose due to 'weakness'      09/13/2015 Imaging    Significant interval treatment response. Pulmonary and liver  metastases have significantly decreased in size. Thoracic adenopathy has decreased in size. Mixed attenuation mass encompassing the uterus and bilateral adnexal regions has significantly decreased in size. No residual visualized peritoneal tumor implants. No new or progressive metastatic disease. 2. No evidence of bowel obstruction or acute bowel inflammation status post diverting sigmoid colostomy. 3. Additional findings include mild aortic atherosclerosis and Stable mild chronic superior T12 compression fracture.      12/14/2015 Imaging    Stable to improved interval exam. Multiple bilateral pulmonary nodules are stable in size to decreased and the liver lesions are stable to decreased. Mixed attenuation lesion encompassing the uterus and adnexal region shows decrease in size. There is no evidence for new or progressive disease on today's exam.      05/15/2016 Imaging    Findings today reflecting mixed interval response to therapy. 2. There has been mild increase in size of pulmonary nodules. 3. Interval increase in size of pelvic mass 4. Further regression of liver metastases 5. There is a new soft tissue nodule within the right lower quadrant peritoneal cavity. Suspicious peritoneal disease      06/07/2016 -  Chemotherapy    She received chemo with cisplatin and gemzar      06/14/2016 Adverse Reaction    Cycle 1, day 8 is put on hold due to severe pancytopenia      08/29/2016 Imaging    Ct scan of chest, abdomen and pelvis: 1. General improvement, with reduced size of the scattered pulmonary nodules, and also a much more normal contour and appearance of the uterus, without the visible enhancing irregular mass shown on the 05/15/2016 exam involving the uterus and cervix. There still some mild soft tissue prominence of the cervix, and a cystic lesion of the left ovary as noted above. No pathologic adenopathy. 2. The right lower quadrant omental nodule has considerably improved and nearly  resolved. 3. The prior hepatic metastatic lesions have essentially resolved, with only minimal residual linear hypodensity in segment 4 at the site of the prior large metastatic lesion shown on 05/15/2015.  4. Other imaging findings of potential clinical significance: Left lower quadrant colostomy. Chronic superior endplate compression at T11. Bony demineralization. Trace free pelvic fluid in the cul-de-sac. Central disc protrusion at L3-4. Left mid kidney hypodense lesion is likely a cyst.      12/12/2016 Imaging    1. Suspected minimal enlargement of at least 1 pulmonary nodule. The majority of pulmonary nodules are similar. Cannot exclude minimal pulmonary metastasis progression. 2. No evidence of extrathoracic progressive metastatic disease. Similar soft tissue fullness within the uterine cervix and similar to slight decrease in size of a tiny omental nodule. 3. Aortic Atherosclerosis (ICD10-I70.0). 4. Chronic T10 compression deformity      03/13/2017 Imaging    1. Interval progression of pre-existing pulmonary metastases with interval development of new pulmonary metastatic lesions. 2. Interval development of new and progression of pre-existing omental metastases. 3. Marked interval progression of cervical disease with dilated fluid-filled endometrial canal in the uterine fundus. The cervical disease infiltrates into the lower uterine segment and likely extends into the adnexal space bilaterally. Posterior extent of cervical disease is tethered to the distal sigmoid colon in this patient with a sigmoid loop colostomy. 4. Stable atrophy of the medial segment left liver.      03/22/2017 -  Chemotherapy    The patient had chemo cisplatini and gemzar       04/26/2017 Imaging    1. Bladder wall irregularity anteriorly, suspicious for cystitis. No specific evidence of fistula to uterus or vagina. Voiding cystogram would likely be of greatest sensitivity. 2. Similar to slight decrease in size of  infiltrative mass centered about the lower uterine segment. Presumed sigmoid colonic wall involvement.       REVIEW OF SYSTEMS:   Constitutional: Denies fevers, chills or abnormal weight loss Eyes: Denies blurriness of vision Ears, nose, mouth, throat, and face: Denies mucositis or sore throat Respiratory: Denies cough, dyspnea or wheezes Cardiovascular: Denies palpitation, chest discomfort or lower extremity swelling Gastrointestinal:  Denies nausea, heartburn or change in bowel habits Skin: Denies abnormal skin rashes Lymphatics: Denies new lymphadenopathy or easy bruising Neurological:Denies numbness, tingling or new weaknesses Behavioral/Psych: Mood is stable, no new changes  All other systems were reviewed with the patient and are negative.  I have reviewed the past medical history, past surgical history, social history and family history with the patient and they are unchanged from previous note.  ALLERGIES:  has No Known Allergies.  MEDICATIONS:  Current Outpatient Medications  Medication Sig Dispense Refill  . cholecalciferol (VITAMIN D) 1000 units tablet Take 1,000 Units by mouth daily.    Marland Kitchen lidocaine-prilocaine (EMLA) cream Apply 1 application topically as needed. 30 g 6  . omeprazole (PRILOSEC) 20 MG capsule TAKE 1 CAPSULE BY MOUTH EVERY DAY 90 capsule 0  . ondansetron (ZOFRAN) 8 MG tablet Take 1 tablet (8 mg total) by mouth every 8 (eight) hours as needed for nausea. (Patient not taking: Reported on 05/02/2017) 30 tablet 3  . oxybutynin (DITROPAN-XL) 5 MG 24 hr tablet Take 1 tablet (5 mg total) by mouth at bedtime. 30 tablet 0  . prochlorperazine (COMPAZINE) 10 MG tablet Take 1 tablet (10 mg total) by mouth every 6 (six) hours as needed for nausea or vomiting. (Patient not taking: Reported on 05/02/2017) 30 tablet 1  . temazepam (RESTORIL) 15 MG capsule Take 15 mg by mouth at bedtime as needed for sleep.     No current facility-administered medications for this visit.     Facility-Administered Medications Ordered in Other Visits  Medication Dose Route Frequency Provider Last Rate Last Dose  . gemcitabine (GEMZAR) 950 mg in sodium chloride 0.9 % 250 mL chemo infusion  500 mg/m2 (Treatment Plan Recorded) Intravenous Once Alvy Bimler, Vannesa Abair, MD      . heparin lock flush 100 unit/mL  500 Units Intravenous Once Livesay, Lennis P, MD      . heparin lock flush 100 unit/mL  500 Units Intracatheter Once PRN Alvy Bimler, Matisha Termine, MD      . sodium chloride flush (NS) 0.9 % injection 10 mL  10 mL Intravenous PRN Livesay, Lennis P, MD      . sodium chloride flush (NS) 0.9 % injection 10 mL  10 mL Intracatheter PRN Alvy Bimler, Alyn Jurney, MD   10 mL at 03/29/17 1658  . sodium chloride flush (NS) 0.9 % injection 10 mL  10 mL Intracatheter PRN Alvy Bimler, Jensine Luz, MD        PHYSICAL EXAMINATION: ECOG PERFORMANCE STATUS: 1 - Symptomatic but completely ambulatory  Vitals:   05/08/17 1149  BP: 119/67  Pulse: (!) 58  Resp: 18  Temp: (!) 97.5 F (36.4 C)  SpO2: 100%   Filed Weights   05/08/17 1149  Weight: 173 lb (78.5 kg)    GENERAL:alert, no distress and comfortable SKIN:  skin color, texture, turgor are normal, no rashes or significant lesions EYES: normal, Conjunctiva are pink and non-injected, sclera clear OROPHARYNX:no exudate, no erythema and lips, buccal mucosa, and tongue normal  NECK: supple, thyroid normal size, non-tender, without nodularity LYMPH:  no palpable lymphadenopathy in the cervical, axillary or inguinal LUNGS: clear to auscultation and percussion with normal breathing effort HEART: regular rate & rhythm and no murmurs and no lower extremity edema ABDOMEN:abdomen soft, non-tender and normal bowel sounds Musculoskeletal:no cyanosis of digits and no clubbing  NEURO: alert & oriented x 3 with fluent speech, no focal motor/sensory deficits  LABORATORY DATA:  I have reviewed the data as listed    Component Value Date/Time   NA 138 05/08/2017 1103   NA 142 12/12/2016 0745   K  4.0 05/08/2017 1103   K 4.5 12/12/2016 0745   CL 106 05/08/2017 1103   CO2 25 05/08/2017 1103   CO2 24 12/12/2016 0745   GLUCOSE 103 05/08/2017 1103   GLUCOSE 93 12/12/2016 0745   BUN 17 05/08/2017 1103   BUN 13.8 12/12/2016 0745   CREATININE 1.25 (H) 05/08/2017 1103   CREATININE 1.1 12/12/2016 0745   CALCIUM 9.4 05/08/2017 1103   CALCIUM 9.3 12/12/2016 0745   PROT 7.2 05/08/2017 1103   PROT 6.9 12/12/2016 0745   ALBUMIN 3.6 05/08/2017 1103   ALBUMIN 3.6 12/12/2016 0745   AST 17 05/08/2017 1103   AST 15 12/12/2016 0745   ALT 12 05/08/2017 1103   ALT 10 12/12/2016 0745   ALKPHOS 115 05/08/2017 1103   ALKPHOS 97 12/12/2016 0745   BILITOT 0.5 05/08/2017 1103   BILITOT 0.34 12/12/2016 0745   GFRNONAA 42 (L) 05/08/2017 1103   GFRAA 49 (L) 05/08/2017 1103    No results found for: SPEP, UPEP  Lab Results  Component Value Date   WBC 5.0 05/08/2017   NEUTROABS 2.9 05/08/2017   HGB 10.5 (L) 05/08/2017   HCT 32.2 (L) 05/08/2017   MCV 89.4 05/08/2017   PLT 157 05/08/2017      Chemistry      Component Value Date/Time   NA 138 05/08/2017 1103   NA 142 12/12/2016 0745   K 4.0 05/08/2017 1103   K 4.5 12/12/2016 0745   CL 106 05/08/2017 1103   CO2 25 05/08/2017 1103   CO2 24 12/12/2016 0745   BUN 17 05/08/2017 1103   BUN 13.8 12/12/2016 0745   CREATININE 1.25 (H) 05/08/2017 1103   CREATININE 1.1 12/12/2016 0745      Component Value Date/Time   CALCIUM 9.4 05/08/2017 1103   CALCIUM 9.3 12/12/2016 0745   ALKPHOS 115 05/08/2017 1103   ALKPHOS 97 12/12/2016 0745   AST 17 05/08/2017 1103   AST 15 12/12/2016 0745   ALT 12 05/08/2017 1103   ALT 10 12/12/2016 0745   BILITOT 0.5 05/08/2017 1103   BILITOT 0.34 12/12/2016 0745       RADIOGRAPHIC STUDIES: I have personally reviewed the radiological images as listed and agreed with the findings in the report. Mr Pelvis W Wo Contrast  Result Date: 04/26/2017 CLINICAL DATA:  Cervical cancer. History of uterine and vaginal  bleeding. Suspect bladder fistula. EXAM: MRI PELVIS WITHOUT AND WITH CONTRAST TECHNIQUE: Multiplanar multisequence MR imaging of the pelvis was performed both before and after administration of intravenous contrast. CONTRAST:  48mL MULTIHANCE GADOBENATE DIMEGLUMINE 529 MG/ML IV SOLN COMPARISON:  03/13/2016 CT. FINDINGS: Urinary Tract: No distal hydroureter. Limited imaging of the kidneys demonstrates no hydronephrosis. The bladder is thick  walled anteriorly with mild pericystic edema. Example image 27/13. The posterior bladder wall demonstrates mild T2 hyperintensity including on image 19/12. There is no well-defined fistulous communication identified. No gas within the bladder. Irregularity about the posterior bladder wall on image 21/15 is favored to be artifactual, not confirmed on other series. Bowel:  Normal caliber of bowel loops.  Descending colostomy. Vascular/Lymphatic: No pelvic aneurysm or sidewall adenopathy. Reproductive: Heterogeneous infiltrative mass centered about the lower uterine segment is difficult to measure secondary to morphology. On the order of 6.2 x 6.2 cm on image 21/13. Compare 7.0 x 6.8 cm on the prior exam (when remeasured). Presumed extension into involvement of both adnexa. Intimate association, presumed involvement of the sigmoid colon, including on image 19/15. Other:  No significant free fluid. Musculoskeletal: No acute osseous abnormality. IMPRESSION: 1. Bladder wall irregularity anteriorly, suspicious for cystitis. No specific evidence of fistula to uterus or vagina. Voiding cystogram would likely be of greatest sensitivity. 2. Similar to slight decrease in size of infiltrative mass centered about the lower uterine segment. Presumed sigmoid colonic wall involvement. Electronically Signed   By: Abigail Miyamoto M.D.   On: 04/26/2017 08:48    All questions were answered. The patient knows to call the clinic with any problems, questions or concerns. No barriers to learning was  detected.  I spent 15 minutes counseling the patient face to face. The total time spent in the appointment was 20 minutes and more than 50% was on counseling and review of test results  Heath Lark, MD 05/08/2017 1:51 PM

## 2017-05-08 NOTE — Assessment & Plan Note (Signed)
She missed her day 1 treatment last week We will proceed with day 8 today. I will omit day 1 of the cycle.  I will cancel G-CSF support in 2 days Next cycle of treatment will begin approximately 2 weeks from now I would get insurance prior authorization to get G-CSF support with her next cycle

## 2017-05-08 NOTE — Patient Instructions (Signed)

## 2017-05-08 NOTE — Telephone Encounter (Signed)
Outgoing call to patient's home number, no answer, left VM.  Called mobile number and reached pt.  Results of pap given as follows per Lenna Sciara NP ' normal pap showing benign radiation changes' .  Pt voiced understanding and no other needs at this time.

## 2017-05-08 NOTE — Assessment & Plan Note (Signed)
She has profound pancytopenia despite aggressive dose adjustment We would proceed with treatment today I would get insurance prior authorization to get G-CSF support

## 2017-05-08 NOTE — Assessment & Plan Note (Signed)
She will continue reduced dose cisplatin, currently stable

## 2017-05-08 NOTE — Telephone Encounter (Signed)
Gave patient AVS and calendar of upcoming march appointments. Patient asked to not be scheduled for her injection appointment. She said she will speak to provider regarding injection .

## 2017-05-08 NOTE — Patient Instructions (Signed)
[facility] Discharge Instructions for Patients Receiving Chemotherapy  Today you received the following chemotherapy agents: Gemcitabine (Gemzar)  To help prevent nausea and vomiting after your treatment, we encourage you to take your nausea medication as prescribed.    If you develop nausea and vomiting that is not controlled by your nausea medication, call the clinic.   BELOW ARE SYMPTOMS THAT SHOULD BE REPORTED IMMEDIATELY:  *FEVER GREATER THAN 100.5 F  *CHILLS WITH OR WITHOUT FEVER  NAUSEA AND VOMITING THAT IS NOT CONTROLLED WITH YOUR NAUSEA MEDICATION  *UNUSUAL SHORTNESS OF BREATH  *UNUSUAL BRUISING OR BLEEDING  TENDERNESS IN MOUTH AND THROAT WITH OR WITHOUT PRESENCE OF ULCERS  *URINARY PROBLEMS  *BOWEL PROBLEMS  UNUSUAL RASH Items with * indicate a potential emergency and should be followed up as soon as possible.  Feel free to call the clinic should you have any questions or concerns. The clinic phone number is [phone].  Please show the CHEMO ALERT CARD at check-in to the Emergency Department and triage nurse.   

## 2017-05-09 ENCOUNTER — Other Ambulatory Visit: Payer: Medicare HMO

## 2017-05-09 ENCOUNTER — Ambulatory Visit: Payer: Medicare HMO | Admitting: Hematology and Oncology

## 2017-05-10 ENCOUNTER — Ambulatory Visit: Payer: Medicare HMO

## 2017-05-15 DIAGNOSIS — Z933 Colostomy status: Secondary | ICD-10-CM | POA: Diagnosis not present

## 2017-05-15 DIAGNOSIS — Z85038 Personal history of other malignant neoplasm of large intestine: Secondary | ICD-10-CM | POA: Diagnosis not present

## 2017-05-17 ENCOUNTER — Ambulatory Visit: Payer: Medicare HMO

## 2017-05-17 ENCOUNTER — Other Ambulatory Visit: Payer: Medicare HMO

## 2017-05-20 ENCOUNTER — Encounter (INDEPENDENT_AMBULATORY_CARE_PROVIDER_SITE_OTHER): Payer: Self-pay | Admitting: Orthopedic Surgery

## 2017-05-20 ENCOUNTER — Ambulatory Visit (INDEPENDENT_AMBULATORY_CARE_PROVIDER_SITE_OTHER): Payer: Medicare HMO

## 2017-05-20 ENCOUNTER — Ambulatory Visit (INDEPENDENT_AMBULATORY_CARE_PROVIDER_SITE_OTHER): Payer: Medicare HMO | Admitting: Orthopedic Surgery

## 2017-05-20 DIAGNOSIS — M545 Low back pain: Secondary | ICD-10-CM

## 2017-05-20 MED ORDER — METHOCARBAMOL 500 MG PO TABS: 500.0000 mg | ORAL_TABLET | Freq: Three times a day (TID) | ORAL | 0 refills | Status: DC | PRN

## 2017-05-20 MED ORDER — PREDNISONE 10 MG PO TABS: 20.0000 mg | ORAL_TABLET | Freq: Every day | ORAL | 0 refills | Status: DC

## 2017-05-20 NOTE — Progress Notes (Signed)
Office Visit Note   Patient: Katherine Boone           Date of Birth: [birth year]           MRN: [id number] Visit Date: 05/20/2017              Requested by: [name], MD [address] [place], [place] [address] PCP: [name], MD  Chief Complaint  Patient presents with  . Lower Back - Pain      HPI: Patient is a 72 year old woman who presents with acute lower back pain and spasm.  She states it started Thursday afternoon she states she cannot get out of a chair without using her arms to help her up.  She states she is been using heat and ibuprofen without relief.  She states the pain is localized to her lumbar spine and radiates proximally denies any radicular symptoms distally.  Patient states that she recently has had an MRI scan of her pelvis with a diagnosis of cervical cancer.  Assessment & Plan: Visit Diagnoses:  1. Low back pain, unspecified back pain laterality, unspecified chronicity, with sciatica presence unspecified     Plan: Recommended low-dose prednisone that she will wean off as she feels better continue with heat and Robaxin for the muscle spasm.  If this does not help she will call and we will set her up for an MRI scan of the lumbar spine to evaluate for disc pathology.  Patient will hold on starting chemotherapy until after her back symptoms have resolved and she is off the medications for her back.  Follow-Up Instructions: Return if symptoms worsen or fail to improve.   Ortho Exam  Patient is alert, oriented, no adenopathy, well-dressed, normal affect, normal respiratory effort. Examination patient has to use her hands to walk her hands upper thighs to stand up she has lower back pain and spasm.  She has no radicular symptoms there is no pain with range of motion of the hip knee or ankle there is a negative straight leg raise and no focal motor weakness in either lower extremity.  Imaging: Xr Lumbar Spine 2-3 Views  Result Date:  05/20/2017 2 view radiographs of the lumbar spine shows degenerative disc disease with old compression fractures at T11 and L5.  No images are attached to the encounter.  Labs: Lab Results  Component Value Date   LABURIC 5.1 03/15/2005   REPTSTATUS 04/13/2017 FINAL 04/12/2017   CULT  04/12/2017    NO GROWTH Performed at [facility], [place] [address] [place], [place] [address]    LABORGA Multiple bacterial morphotypes present, none 01/02/2011   LABORGA predominant. Suggest appropriate recollection if  01/02/2011   LABORGA clinically indicated. 01/02/2011    @LABSALLVALUES (HGBA1)@  There is no height or weight on file to calculate BMI.  Orders:  Orders Placed This Encounter  Procedures  . XR Lumbar Spine 2-3 Views   Meds ordered this encounter  Medications  . predniSONE (DELTASONE) 10 MG tablet    Sig: Take 2 tablets (20 mg total) by mouth daily with breakfast.    Dispense:  60 tablet    Refill:  0  . methocarbamol (ROBAXIN) 500 MG tablet    Sig: Take 1 tablet (500 mg total) by mouth every 8 (eight) hours as needed for muscle spasms.    Dispense:  30 tablet    Refill:  0     Procedures: No procedures performed  Clinical Data: No additional findings.  ROS:  All other systems negative, except as noted in the HPI. Review of Systems  Objective: Vital Signs: There were no vitals taken for this visit.  Specialty Comments:  No specialty comments available.  PMFS History: Patient Active Problem List   Diagnosis Date Noted  . Fistula, bladder 04/19/2017  . Dysuria 04/12/2017  . Cellulitis and abscess of foot 12/11/2016  . Immunocompromised ([place]) 12/11/2016  . Chronic kidney disease, stage III (moderate) ([place]) 11/22/2016  . Other fatigue 08/16/2016  . Pancytopenia, acquired ([place]) 06/14/2016  . Goals of care, counseling/discussion 05/22/2016  . Cervical cancer ([place]) 05/21/2016  . Chemotherapy-induced peripheral neuropathy ([place]) 12/17/2015  . Vitamin D  deficiency 12/16/2015  . Port catheter in place 06/25/2015  . Poor venous access 06/11/2015  . Colostomy in place [facility]) 06/11/2015  . Protein calorie malnutrition ([place]) 05/28/2015  . Metastatic cancer to pelvis ([place]) 05/28/2015  . Metastases to the liver ([place]) 05/28/2015  . Chemotherapy induced neutropenia ([place])   . Constipation by outlet obstruction 05/15/2015  . Malignant neoplasm metastatic to right lung ([place]) 05/06/2015  . Malignant neoplasm metastatic to left lung ([place]) 05/06/2015  . International Federation of Gynecology and Obstetrics (FIGO) stage IVB malignant neoplasm of cervix ([place]) 05/06/2015  . Pelvic mass in female   . Other constipation 04/26/2015  . Metastatic cancer ([place]) 04/26/2015  . Health maintenance examination 07/06/2014  . Advanced care planning/counseling discussion 07/06/2014  . Renal insufficiency 07/06/2014  . HLD (hyperlipidemia) 06/29/2014  . Osteoporosis 02/03/2011  . Urinary urgency 01/02/2011  . Medicare annual wellness visit, initial 01/02/2011  . Insomnia 01/02/2011  . GERD (gastroesophageal reflux disease)   . Family history of cancer    Past Medical History:  Diagnosis Date  . Cancer (HCC)    Cervical  . Family history of cancer    stomach, colon, lung, throat  . GERD (gastroesophageal reflux disease)    diet controlled  . Gout 03/2005   ?  Marland Kitchen Large bowel obstruction ([place]) 05/28/2015  . Migraines    none recently  . Osteoporosis 02/2011   DEXA 12/12 - Lspine -2.6; femur -3.8  . Small bowel obstruction (HCC)     Family History  Problem Relation Age of Onset  . Heart disease Mother   . Hypertension Mother   . Cancer Mother 109       colon and stomach  . Cancer Brother        stomach and throat, smoker  . Hypertension Sister   . Cancer Brother        stomach and lung, smoker  . COPD Brother   . Cancer Father        stomach cancer  . Ovarian cancer Unknown        maternal neice  . Throat cancer Brother   . Stroke Sister   .  Cancer Other        niece - stomach cancer  . Diabetes Neg Hx   . Coronary artery disease Neg Hx     Past Surgical History:  Procedure Laterality Date  . APPENDECTOMY  1960  . LAPAROSCOPIC DIVERTED COLOSTOMY N/A 05/18/2015   Procedure: LAPAROSCOPIC DIVERTED LOOP COLOSTOMY ;  Surgeon: Leighton Ruff, MD;  Location: [facility];  Service: General;  Laterality: N/A;   Social History   Occupational History    Employer: [employer]  Tobacco Use  . Smoking status: Never Smoker  . Smokeless tobacco: Never Used  Substance and Sexual Activity  . Alcohol use: No  .  Drug use: No  . Sexual activity: No

## 2017-05-21 ENCOUNTER — Telehealth: Payer: Self-pay | Admitting: Hematology and Oncology

## 2017-05-21 NOTE — Telephone Encounter (Signed)
Spoke to patient regarding upcoming march and April appointments.  °

## 2017-05-23 ENCOUNTER — Other Ambulatory Visit: Payer: Medicare HMO

## 2017-05-23 ENCOUNTER — Ambulatory Visit: Payer: Medicare HMO

## 2017-05-29 ENCOUNTER — Other Ambulatory Visit: Payer: Self-pay | Admitting: Hematology and Oncology

## 2017-05-30 ENCOUNTER — Ambulatory Visit: Payer: Medicare HMO

## 2017-05-30 ENCOUNTER — Inpatient Hospital Stay: Payer: Medicare HMO

## 2017-05-30 ENCOUNTER — Other Ambulatory Visit: Payer: Medicare HMO

## 2017-05-30 ENCOUNTER — Ambulatory Visit: Payer: Medicare HMO | Admitting: Hematology and Oncology

## 2017-05-30 VITALS — BP 109/62 | HR 67 | Temp 98.3°F | Resp 17

## 2017-05-30 DIAGNOSIS — C787 Secondary malignant neoplasm of liver and intrahepatic bile duct: Secondary | ICD-10-CM

## 2017-05-30 DIAGNOSIS — C786 Secondary malignant neoplasm of retroperitoneum and peritoneum: Secondary | ICD-10-CM | POA: Diagnosis not present

## 2017-05-30 DIAGNOSIS — C539 Malignant neoplasm of cervix uteri, unspecified: Secondary | ICD-10-CM

## 2017-05-30 DIAGNOSIS — C7802 Secondary malignant neoplasm of left lung: Secondary | ICD-10-CM

## 2017-05-30 DIAGNOSIS — C7801 Secondary malignant neoplasm of right lung: Secondary | ICD-10-CM

## 2017-05-30 DIAGNOSIS — C799 Secondary malignant neoplasm of unspecified site: Secondary | ICD-10-CM

## 2017-05-30 DIAGNOSIS — Z933 Colostomy status: Secondary | ICD-10-CM | POA: Diagnosis not present

## 2017-05-30 DIAGNOSIS — N183 Chronic kidney disease, stage 3 (moderate): Secondary | ICD-10-CM | POA: Diagnosis not present

## 2017-05-30 DIAGNOSIS — Z79899 Other long term (current) drug therapy: Secondary | ICD-10-CM | POA: Diagnosis not present

## 2017-05-30 DIAGNOSIS — Z9221 Personal history of antineoplastic chemotherapy: Secondary | ICD-10-CM | POA: Diagnosis not present

## 2017-05-30 DIAGNOSIS — D61818 Other pancytopenia: Secondary | ICD-10-CM | POA: Diagnosis not present

## 2017-05-30 LAB — COMPREHENSIVE METABOLIC PANEL
ALK PHOS: 147 U/L (ref 40–150)
ALT: 18 U/L (ref 0–55)
AST: 13 U/L (ref 5–34)
Albumin: 3.6 g/dL (ref 3.5–5.0)
Anion gap: 8 (ref 3–11)
BUN: 29 mg/dL — ABNORMAL HIGH (ref 7–26)
CO2: 23 mmol/L (ref 22–29)
CREATININE: 1.3 mg/dL — AB (ref 0.60–1.10)
Calcium: 9.2 mg/dL (ref 8.4–10.4)
Chloride: 107 mmol/L (ref 98–109)
GFR, EST AFRICAN AMERICAN: 47 mL/min — AB (ref >60)
GFR, EST NON AFRICAN AMERICAN: 40 mL/min — AB (ref >60)
Glucose, Bld: 106 mg/dL (ref 70–140)
Potassium: 4.2 mmol/L (ref 3.5–5.1)
SODIUM: 138 mmol/L (ref 136–145)
Total Bilirubin: 0.3 mg/dL (ref 0.2–1.2)
Total Protein: 7.4 g/dL (ref 6.4–8.3)

## 2017-05-30 LAB — CBC WITH DIFFERENTIAL/PLATELET
Basophils Absolute: 0.1 10*3/uL (ref 0.0–0.1)
Basophils Relative: 1 %
EOS ABS: 0.1 10*3/uL (ref 0.0–0.5)
EOS PCT: 2 %
HCT: 33.2 % — ABNORMAL LOW (ref 34.8–46.6)
HEMOGLOBIN: 11.2 g/dL — AB (ref 11.6–15.9)
Lymphocytes Relative: 21 %
Lymphs Abs: 1.8 10*3/uL (ref 0.9–3.3)
MCH: 30.1 pg (ref 25.1–34.0)
MCHC: 33.7 g/dL (ref 31.5–36.0)
MCV: 89.4 fL (ref 79.5–101.0)
MONOS PCT: 10 %
Monocytes Absolute: 0.9 10*3/uL (ref 0.1–0.9)
NEUTROS PCT: 66 %
Neutro Abs: 5.7 10*3/uL (ref 1.5–6.5)
Platelets: 300 10*3/uL (ref 145–400)
RBC: 3.72 MIL/uL (ref 3.70–5.45)
RDW: 18.2 % — AB (ref 11.2–14.5)
WBC: 8.6 10*3/uL (ref 3.9–10.3)

## 2017-05-30 LAB — MAGNESIUM: Magnesium: 2.7 mg/dL — ABNORMAL HIGH (ref 1.5–2.5)

## 2017-05-30 MED ORDER — SODIUM CHLORIDE 0.9 % IV SOLN
Freq: Once | INTRAVENOUS | Status: AC
  Administered 2017-05-30: 09:00:00 via INTRAVENOUS

## 2017-05-30 MED ORDER — PALONOSETRON HCL INJECTION 0.25 MG/5ML
INTRAVENOUS | Status: AC
  Filled 2017-05-30: qty 5

## 2017-05-30 MED ORDER — SODIUM CHLORIDE 0.9 % IV SOLN
Freq: Once | INTRAVENOUS | Status: AC
  Administered 2017-05-30: 14:00:00 via INTRAVENOUS
  Filled 2017-05-30: qty 5

## 2017-05-30 MED ORDER — PALONOSETRON HCL INJECTION 0.25 MG/5ML
0.2500 mg | Freq: Once | INTRAVENOUS | Status: AC
  Administered 2017-05-30: 0.25 mg via INTRAVENOUS

## 2017-05-30 MED ORDER — SODIUM CHLORIDE 0.9 % IV SOLN
26.5000 mg/m2 | Freq: Once | INTRAVENOUS | Status: AC
  Administered 2017-05-30: 50 mg via INTRAVENOUS
  Filled 2017-05-30: qty 50

## 2017-05-30 MED ORDER — HEPARIN SOD (PORK) LOCK FLUSH 100 UNIT/ML IV SOLN
500.0000 [IU] | Freq: Once | INTRAVENOUS | Status: AC | PRN
  Administered 2017-05-30: 500 [IU]
  Filled 2017-05-30: qty 5

## 2017-05-30 MED ORDER — DEXTROSE-NACL 5-0.45 % IV SOLN
Freq: Once | INTRAVENOUS | Status: AC
  Administered 2017-05-30: 11:00:00 via INTRAVENOUS
  Filled 2017-05-30: qty 10

## 2017-05-30 MED ORDER — SODIUM CHLORIDE 0.9% FLUSH
10.0000 mL | INTRAVENOUS | Status: DC | PRN
  Administered 2017-05-30: 10 mL
  Filled 2017-05-30: qty 10

## 2017-05-30 MED ORDER — SODIUM CHLORIDE 0.9 % IV SOLN
500.0000 mg/m2 | Freq: Once | INTRAVENOUS | Status: AC
  Administered 2017-05-30: 950 mg via INTRAVENOUS
  Filled 2017-05-30: qty 24.98

## 2017-05-30 NOTE — Progress Notes (Signed)
Per Dr Alvy Bimler ok to stop Cisplatin post-hydration fluids at 1800 today 05/30/2017, advised patient to drink plenty of fluids. Pt verbalized understanding.

## 2017-05-30 NOTE — Patient Instructions (Signed)
[facility] Discharge Instructions for Patients Receiving Chemotherapy  Today you received the following chemotherapy agents:  Gemzar and Cisplatin.  To help prevent nausea and vomiting after your treatment, we encourage you to take your nausea medication as directed.   If you develop nausea and vomiting that is not controlled by your nausea medication, call the clinic.   BELOW ARE SYMPTOMS THAT SHOULD BE REPORTED IMMEDIATELY:  *FEVER GREATER THAN 100.5 F  *CHILLS WITH OR WITHOUT FEVER  NAUSEA AND VOMITING THAT IS NOT CONTROLLED WITH YOUR NAUSEA MEDICATION  *UNUSUAL SHORTNESS OF BREATH  *UNUSUAL BRUISING OR BLEEDING  TENDERNESS IN MOUTH AND THROAT WITH OR WITHOUT PRESENCE OF ULCERS  *URINARY PROBLEMS  *BOWEL PROBLEMS  UNUSUAL RASH Items with * indicate a potential emergency and should be followed up as soon as possible.  Feel free to call the clinic you have any questions or concerns. The clinic phone number is [phone].  Please show the CHEMO ALERT CARD at check-in to the Emergency Department and triage nurse.   

## 2017-05-31 ENCOUNTER — Other Ambulatory Visit: Payer: Self-pay | Admitting: Gynecologic Oncology

## 2017-05-31 DIAGNOSIS — R3915 Urgency of urination: Secondary | ICD-10-CM

## 2017-06-04 ENCOUNTER — Telehealth: Payer: Self-pay | Admitting: Hematology and Oncology

## 2017-06-04 NOTE — Telephone Encounter (Signed)
Added inj 4/5 per 4/2 schedule message. Spoke with patient. Also confirmed 4/4 appointments.

## 2017-06-06 ENCOUNTER — Telehealth: Payer: Self-pay | Admitting: Hematology and Oncology

## 2017-06-06 ENCOUNTER — Inpatient Hospital Stay: Payer: Medicare HMO

## 2017-06-06 ENCOUNTER — Inpatient Hospital Stay: Payer: Medicare HMO | Attending: Hematology and Oncology

## 2017-06-06 ENCOUNTER — Inpatient Hospital Stay (HOSPITAL_BASED_OUTPATIENT_CLINIC_OR_DEPARTMENT_OTHER): Payer: Medicare HMO | Admitting: Hematology and Oncology

## 2017-06-06 ENCOUNTER — Encounter: Payer: Self-pay | Admitting: Hematology and Oncology

## 2017-06-06 DIAGNOSIS — C7801 Secondary malignant neoplasm of right lung: Secondary | ICD-10-CM | POA: Insufficient documentation

## 2017-06-06 DIAGNOSIS — R112 Nausea with vomiting, unspecified: Secondary | ICD-10-CM | POA: Diagnosis not present

## 2017-06-06 DIAGNOSIS — C7802 Secondary malignant neoplasm of left lung: Secondary | ICD-10-CM | POA: Insufficient documentation

## 2017-06-06 DIAGNOSIS — N390 Urinary tract infection, site not specified: Secondary | ICD-10-CM | POA: Diagnosis not present

## 2017-06-06 DIAGNOSIS — Z9221 Personal history of antineoplastic chemotherapy: Secondary | ICD-10-CM

## 2017-06-06 DIAGNOSIS — D61818 Other pancytopenia: Secondary | ICD-10-CM | POA: Diagnosis not present

## 2017-06-06 DIAGNOSIS — R319 Hematuria, unspecified: Secondary | ICD-10-CM | POA: Diagnosis not present

## 2017-06-06 DIAGNOSIS — Z79899 Other long term (current) drug therapy: Secondary | ICD-10-CM

## 2017-06-06 DIAGNOSIS — C539 Malignant neoplasm of cervix uteri, unspecified: Secondary | ICD-10-CM | POA: Diagnosis not present

## 2017-06-06 DIAGNOSIS — C799 Secondary malignant neoplasm of unspecified site: Secondary | ICD-10-CM

## 2017-06-06 DIAGNOSIS — C787 Secondary malignant neoplasm of liver and intrahepatic bile duct: Secondary | ICD-10-CM

## 2017-06-06 DIAGNOSIS — Z5111 Encounter for antineoplastic chemotherapy: Secondary | ICD-10-CM | POA: Diagnosis not present

## 2017-06-06 DIAGNOSIS — Z933 Colostomy status: Secondary | ICD-10-CM | POA: Diagnosis not present

## 2017-06-06 DIAGNOSIS — G620 Drug-induced polyneuropathy: Secondary | ICD-10-CM

## 2017-06-06 DIAGNOSIS — C7989 Secondary malignant neoplasm of other specified sites: Secondary | ICD-10-CM

## 2017-06-06 DIAGNOSIS — T451X5A Adverse effect of antineoplastic and immunosuppressive drugs, initial encounter: Secondary | ICD-10-CM

## 2017-06-06 DIAGNOSIS — N183 Chronic kidney disease, stage 3 unspecified: Secondary | ICD-10-CM

## 2017-06-06 LAB — CBC WITH DIFFERENTIAL/PLATELET
BASOS ABS: 0 10*3/uL (ref 0.0–0.1)
Basophils Relative: 0 %
Eosinophils Absolute: 0 10*3/uL (ref 0.0–0.5)
Eosinophils Relative: 0 %
HEMATOCRIT: 33.4 % — AB (ref 34.8–46.6)
Hemoglobin: 10.9 g/dL — ABNORMAL LOW (ref 11.6–15.9)
LYMPHS PCT: 14 %
Lymphs Abs: 0.9 10*3/uL (ref 0.9–3.3)
MCH: 29.8 pg (ref 25.1–34.0)
MCHC: 32.6 g/dL (ref 31.5–36.0)
MCV: 91.3 fL (ref 79.5–101.0)
Monocytes Absolute: 0.1 10*3/uL (ref 0.1–0.9)
Monocytes Relative: 1 %
NEUTROS ABS: 5.7 10*3/uL (ref 1.5–6.5)
Neutrophils Relative %: 85 %
Platelets: 129 10*3/uL — ABNORMAL LOW (ref 145–400)
RBC: 3.66 MIL/uL — AB (ref 3.70–5.45)
RDW: 15.8 % — ABNORMAL HIGH (ref 11.2–14.5)
WBC: 6.7 10*3/uL (ref 3.9–10.3)

## 2017-06-06 LAB — COMPREHENSIVE METABOLIC PANEL
ALT: 28 U/L (ref 0–55)
AST: 19 U/L (ref 5–34)
Albumin: 3.6 g/dL (ref 3.5–5.0)
Alkaline Phosphatase: 145 U/L (ref 40–150)
Anion gap: 7 (ref 3–11)
BILIRUBIN TOTAL: 0.4 mg/dL (ref 0.2–1.2)
BUN: 28 mg/dL — AB (ref 7–26)
CHLORIDE: 103 mmol/L (ref 98–109)
CO2: 27 mmol/L (ref 22–29)
CREATININE: 1.22 mg/dL — AB (ref 0.60–1.10)
Calcium: 9.5 mg/dL (ref 8.4–10.4)
GFR calc Af Amer: 50 mL/min — ABNORMAL LOW (ref >60)
GFR, EST NON AFRICAN AMERICAN: 43 mL/min — AB (ref >60)
Glucose, Bld: 115 mg/dL (ref 70–140)
Potassium: 5.3 mmol/L — ABNORMAL HIGH (ref 3.5–5.1)
Sodium: 137 mmol/L (ref 136–145)
TOTAL PROTEIN: 7.2 g/dL (ref 6.4–8.3)

## 2017-06-06 MED ORDER — SODIUM CHLORIDE 0.9% FLUSH
10.0000 mL | Freq: Once | INTRAVENOUS | Status: AC
  Administered 2017-06-06: 10 mL
  Filled 2017-06-06: qty 10

## 2017-06-06 MED ORDER — HEPARIN SOD (PORK) LOCK FLUSH 100 UNIT/ML IV SOLN
500.0000 [IU] | Freq: Once | INTRAVENOUS | Status: AC
  Administered 2017-06-06: 500 [IU]
  Filled 2017-06-06: qty 5

## 2017-06-06 MED ORDER — ALTEPLASE 2 MG IJ SOLR
INTRAMUSCULAR | Status: AC
  Filled 2017-06-06: qty 2

## 2017-06-06 MED ORDER — PROCHLORPERAZINE MALEATE 10 MG PO TABS
10.0000 mg | ORAL_TABLET | Freq: Once | ORAL | Status: AC
  Administered 2017-06-06: 10 mg via ORAL

## 2017-06-06 MED ORDER — SODIUM CHLORIDE 0.9% FLUSH
10.0000 mL | Freq: Once | INTRAVENOUS | Status: AC
Start: 2017-06-06 — End: 2017-06-06
  Administered 2017-06-06: 10 mL
  Filled 2017-06-06: qty 10

## 2017-06-06 MED ORDER — SODIUM CHLORIDE 0.9 % IV SOLN
Freq: Once | INTRAVENOUS | Status: AC
  Administered 2017-06-06: 13:00:00 via INTRAVENOUS

## 2017-06-06 MED ORDER — ALTEPLASE 2 MG IJ SOLR
2.0000 mg | Freq: Once | INTRAMUSCULAR | Status: AC
  Administered 2017-06-06: 2 mg
  Filled 2017-06-06: qty 2

## 2017-06-06 MED ORDER — PROCHLORPERAZINE MALEATE 10 MG PO TABS
ORAL_TABLET | ORAL | Status: AC
  Filled 2017-06-06: qty 1

## 2017-06-06 MED ORDER — SODIUM CHLORIDE 0.9 % IV SOLN
500.0000 mg/m2 | Freq: Once | INTRAVENOUS | Status: AC
  Administered 2017-06-06: 950 mg via INTRAVENOUS
  Filled 2017-06-06: qty 24.98

## 2017-06-06 NOTE — Assessment & Plan Note (Signed)
The patient had excessive delay of chemotherapy due to multiple rescheduling and the patient preference of delaying treatment We discussed the risk and benefits of not pursuing CT imaging now She would like to hold off another month before repeat imaging study next month I would proceed with treatment day 8 today followed by G-CSF support tomorrow I will see her back next month for further follow-up

## 2017-06-06 NOTE — Progress Notes (Signed)
[place] OFFICE PROGRESS NOTE  Patient Care Team: [name], MD as PCP - General (Family Medicine) Carmel [place], OD as Consulting Physician (Optometry)  ASSESSMENT & PLAN:  Cervical cancer [facility]) The patient had excessive delay of chemotherapy due to multiple rescheduling and the patient preference of delaying treatment We discussed the risk and benefits of not pursuing CT imaging now She would like to hold off another month before repeat imaging study next month I would proceed with treatment day 8 today followed by G-CSF support tomorrow I will see her back next month for further follow-up  Pancytopenia, acquired [facility]) She has profound pancytopenia despite aggressive dose adjustment We would proceed with treatment today with GCSF support  Malignant neoplasm metastatic to left lung [facility]) She is not symptomatic from lung metastasis.  Observe.  Chemotherapy-induced peripheral neuropathy (HCC) She has mild residual peripheral neuropathy but not worse We will observe and proceed with dose reduce chemotherapy as discussed.  Chronic kidney disease, stage III (moderate) She will continue reduced dose cisplatin, currently stable   No orders of the defined types were placed in this encounter.   INTERVAL HISTORY: Please see below for problem oriented charting. She returns for chemotherapy Her chemotherapy has not rescheduled many times due to various issues She continues to have persistent neuropathy affecting her hands but not her feet She denies nausea or vomiting She has constipation, resolved with laxatives She denies chest pain or shortness of breath The patient denies any recent signs or symptoms of bleeding such as spontaneous epistaxis, hematuria or hematochezia.   SUMMARY OF ONCOLOGIC HISTORY: Oncology History   PD-L 1 testing neg     Cervical cancer ([place])   04/26/2015 Imaging    Numerous bilateral pulmonary nodules consistent with metastatic  disease are associated with liver lesions, omental nodules, mesenteric nodules, necrotic retroperitoneal lymphadenopathy in a mixed cystic and solid mass in the central pelvis involving the uterus extending into the cul-de-sac and both adnexal regions. Uterine primary (likely endometrial) is favored over an ovarian primary malignancy given the lack of ascites, associated intrahepatic metastases and pulmonary involvement.      05/04/2015 Procedure    Technically successful CT-guided core biopsy of left pelvic mass      05/04/2015 Pathology Results    Soft Tissue Needle Core Biopsy, left ext iliac mass METASTATIC HIGH GRADE POORLY DIFFERENTIATED CARCINOMA CONSISTENT WITH CERVICAL ORIGIN Microscopic Comment The neoplasm stains positive for cervical markers : p16, ER, ck8/18, and p63 (focal), TTF-1 (focal) and negative for synaptophysin, Chromogranin (neuroendocrine markers) and ck5/6. The morphology and immunohistochemistry staining pattern support these cells are GYN cervical origin.Spec      05/10/2015 Imaging    Innumerable (> than 40) pulmonary nodules randomly distributed throughout both lungs, largest 1.1 cm, in keeping with pulmonary metastases. 2. Left supraclavicular and left hilar nodal metastases. 3. Re- demonstration of liver metastases. 4. Right thyroid lobe 1.4 cm pulmonary nodule, for which no further evaluation is recommended. 5. Stable mild superior T11 vertebral compression deformity.      05/12/2015 - 05/12/2015 Chemotherapy    She received 1 dose of carbo/taxol only      05/15/2015 Imaging    Widely metastatic cervical carcinoma with unchanged appearance compared to 04/26/2015. Bulky tumor narrows and likely invades the rectum/distal sigmoid with progressive proximal stool retention      05/15/2015 - 05/23/2015 Hospital Admission    She was admitted for severe bowel obstruction requiring urgent diversion colostomy      05/18/2015 Surgery  She underwent LAPAROSCOPIC DIVERTING  SIGMOID LOOP COLOSTOMY for bowel obstruction       06/10/2015 Procedure    Successful placement of a right internal jugular approach power injectable Port-A-Cath. The catheter is ready for immediate use      06/23/2015 - 03/01/2016 Chemotherapy    She received combination treatment with cisplatin, Taxol and Avastin. Avastin was started on 5/11. Dose of Taxol was modified due to neuropathy and cisplatin was omited last dose due to 'weakness'      09/13/2015 Imaging    Significant interval treatment response. Pulmonary and liver metastases have significantly decreased in size. Thoracic adenopathy has decreased in size. Mixed attenuation mass encompassing the uterus and bilateral adnexal regions has significantly decreased in size. No residual visualized peritoneal tumor implants. No new or progressive metastatic disease. 2. No evidence of bowel obstruction or acute bowel inflammation status post diverting sigmoid colostomy. 3. Additional findings include mild aortic atherosclerosis and Stable mild chronic superior T12 compression fracture.      12/14/2015 Imaging    Stable to improved interval exam. Multiple bilateral pulmonary nodules are stable in size to decreased and the liver lesions are stable to decreased. Mixed attenuation lesion encompassing the uterus and adnexal region shows decrease in size. There is no evidence for new or progressive disease on today's exam.      05/15/2016 Imaging    Findings today reflecting mixed interval response to therapy. 2. There has been mild increase in size of pulmonary nodules. 3. Interval increase in size of pelvic mass 4. Further regression of liver metastases 5. There is a new soft tissue nodule within the right lower quadrant peritoneal cavity. Suspicious peritoneal disease      06/07/2016 -  Chemotherapy    She received chemo with cisplatin and gemzar      06/14/2016 Adverse Reaction    Cycle 1, day 8 is put on hold due to severe pancytopenia       08/29/2016 Imaging    Ct scan of chest, abdomen and pelvis: 1. General improvement, with reduced size of the scattered pulmonary nodules, and also a much more normal contour and appearance of the uterus, without the visible enhancing irregular mass shown on the 05/15/2016 exam involving the uterus and cervix. There still some mild soft tissue prominence of the cervix, and a cystic lesion of the left ovary as noted above. No pathologic adenopathy. 2. The right lower quadrant omental nodule has considerably improved and nearly resolved. 3. The prior hepatic metastatic lesions have essentially resolved, with only minimal residual linear hypodensity in segment 4 at the site of the prior large metastatic lesion shown on 05/15/2015.  4. Other imaging findings of potential clinical significance: Left lower quadrant colostomy. Chronic superior endplate compression at T11. Bony demineralization. Trace free pelvic fluid in the cul-de-sac. Central disc protrusion at L3-4. Left mid kidney hypodense lesion is likely a cyst.      12/12/2016 Imaging    1. Suspected minimal enlargement of at least 1 pulmonary nodule. The majority of pulmonary nodules are similar. Cannot exclude minimal pulmonary metastasis progression. 2. No evidence of extrathoracic progressive metastatic disease. Similar soft tissue fullness within the uterine cervix and similar to slight decrease in size of a tiny omental nodule. 3. Aortic Atherosclerosis (ICD10-I70.0). 4. Chronic T10 compression deformity      03/13/2017 Imaging    1. Interval progression of pre-existing pulmonary metastases with interval development of new pulmonary metastatic lesions. 2. Interval development of new and progression of pre-existing  omental metastases. 3. Marked interval progression of cervical disease with dilated fluid-filled endometrial canal in the uterine fundus. The cervical disease infiltrates into the lower uterine segment and likely extends into the  adnexal space bilaterally. Posterior extent of cervical disease is tethered to the distal sigmoid colon in this patient with a sigmoid loop colostomy. 4. Stable atrophy of the medial segment left liver.      03/22/2017 -  Chemotherapy    The patient had chemo cisplatini and gemzar       04/26/2017 Imaging    1. Bladder wall irregularity anteriorly, suspicious for cystitis. No specific evidence of fistula to uterus or vagina. Voiding cystogram would likely be of greatest sensitivity. 2. Similar to slight decrease in size of infiltrative mass centered about the lower uterine segment. Presumed sigmoid colonic wall involvement.       REVIEW OF SYSTEMS:   Constitutional: Denies fevers, chills or abnormal weight loss Eyes: Denies blurriness of vision Ears, nose, mouth, throat, and face: Denies mucositis or sore throat Respiratory: Denies cough, dyspnea or wheezes Cardiovascular: Denies palpitation, chest discomfort or lower extremity swelling Gastrointestinal:  Denies nausea, heartburn or change in bowel habits Skin: Denies abnormal skin rashes Lymphatics: Denies new lymphadenopathy or easy bruising Neurological:Denies numbness, tingling or new weaknesses Behavioral/Psych: Mood is stable, no new changes  All other systems were reviewed with the patient and are negative.  I have reviewed the past medical history, past surgical history, social history and family history with the patient and they are unchanged from previous note.  ALLERGIES:  has No Known Allergies.  MEDICATIONS:  Current Outpatient Medications  Medication Sig Dispense Refill  . cholecalciferol (VITAMIN D) 1000 units tablet Take 1,000 Units by mouth daily.    Marland Kitchen lidocaine-prilocaine (EMLA) cream Apply 1 application topically as needed. 30 g 6  . methocarbamol (ROBAXIN) 500 MG tablet Take 1 tablet (500 mg total) by mouth every 8 (eight) hours as needed for muscle spasms. 30 tablet 0  . omeprazole (PRILOSEC) 20 MG capsule  TAKE 1 CAPSULE BY MOUTH EVERY DAY 90 capsule 0  . ondansetron (ZOFRAN) 8 MG tablet Take 1 tablet (8 mg total) by mouth every 8 (eight) hours as needed for nausea. 30 tablet 3  . oxybutynin (DITROPAN-XL) 5 MG 24 hr tablet TAKE 1 TABLET BY MOUTH EVERYDAY AT BEDTIME 30 tablet 3  . predniSONE (DELTASONE) 10 MG tablet Take 2 tablets (20 mg total) by mouth daily with breakfast. 60 tablet 0  . prochlorperazine (COMPAZINE) 10 MG tablet Take 1 tablet (10 mg total) by mouth every 6 (six) hours as needed for nausea or vomiting. 30 tablet 1  . temazepam (RESTORIL) 15 MG capsule Take 15 mg by mouth at bedtime as needed for sleep.     No current facility-administered medications for this visit.    Facility-Administered Medications Ordered in Other Visits  Medication Dose Route Frequency Provider Last Rate Last Dose  . heparin lock flush 100 unit/mL  500 Units Intravenous Once Livesay, Lennis P, MD      . sodium chloride flush (NS) 0.9 % injection 10 mL  10 mL Intravenous PRN Livesay, Lennis P, MD      . sodium chloride flush (NS) 0.9 % injection 10 mL  10 mL Intracatheter PRN Alvy Bimler, Evola Hollis, MD   10 mL at 03/29/17 1658    PHYSICAL EXAMINATION: ECOG PERFORMANCE STATUS: 1 - Symptomatic but completely ambulatory  Vitals:   06/06/17 1213  BP: 134/71  Pulse: 68  Resp: 18  Temp: 98.2 F (36.8 C)  SpO2: 100%   Filed Weights   06/06/17 1213  Weight: 171 lb (77.6 kg)    GENERAL:alert, no distress and comfortable SKIN: skin color, texture, turgor are normal, no rashes or significant lesions EYES: normal, Conjunctiva are pink and non-injected, sclera clear OROPHARYNX:no exudate, no erythema and lips, buccal mucosa, and tongue normal  NECK: supple, thyroid normal size, non-tender, without nodularity LYMPH:  no palpable lymphadenopathy in the cervical, axillary or inguinal LUNGS: clear to auscultation and percussion with normal breathing effort HEART: regular rate & rhythm and no murmurs and no lower  extremity edema ABDOMEN:abdomen soft, non-tender and normal bowel sounds Musculoskeletal:no cyanosis of digits and no clubbing  NEURO: alert & oriented x 3 with fluent speech, no focal motor/sensory deficits  LABORATORY DATA:  I have reviewed the data as listed    Component Value Date/Time   NA 138 05/30/2017 0925   NA 142 12/12/2016 0745   K 4.2 05/30/2017 0925   K 4.5 12/12/2016 0745   CL 107 05/30/2017 0925   CO2 23 05/30/2017 0925   CO2 24 12/12/2016 0745   GLUCOSE 106 05/30/2017 0925   GLUCOSE 93 12/12/2016 0745   BUN 29 (H) 05/30/2017 0925   BUN 13.8 12/12/2016 0745   CREATININE 1.30 (H) 05/30/2017 0925   CREATININE 1.1 12/12/2016 0745   CALCIUM 9.2 05/30/2017 0925   CALCIUM 9.3 12/12/2016 0745   PROT 7.4 05/30/2017 0925   PROT 6.9 12/12/2016 0745   ALBUMIN 3.6 05/30/2017 0925   ALBUMIN 3.6 12/12/2016 0745   AST 13 05/30/2017 0925   AST 15 12/12/2016 0745   ALT 18 05/30/2017 0925   ALT 10 12/12/2016 0745   ALKPHOS 147 05/30/2017 0925   ALKPHOS 97 12/12/2016 0745   BILITOT 0.3 05/30/2017 0925   BILITOT 0.34 12/12/2016 0745   GFRNONAA 40 (L) 05/30/2017 0925   GFRAA 47 (L) 05/30/2017 0925    No results found for: SPEP, UPEP  Lab Results  Component Value Date   WBC 6.7 06/06/2017   NEUTROABS 5.7 06/06/2017   HGB 10.9 (L) 06/06/2017   HCT 33.4 (L) 06/06/2017   MCV 91.3 06/06/2017   PLT 129 (L) 06/06/2017      Chemistry      Component Value Date/Time   NA 138 05/30/2017 0925   NA 142 12/12/2016 0745   K 4.2 05/30/2017 0925   K 4.5 12/12/2016 0745   CL 107 05/30/2017 0925   CO2 23 05/30/2017 0925   CO2 24 12/12/2016 0745   BUN 29 (H) 05/30/2017 0925   BUN 13.8 12/12/2016 0745   CREATININE 1.30 (H) 05/30/2017 0925   CREATININE 1.1 12/12/2016 0745      Component Value Date/Time   CALCIUM 9.2 05/30/2017 0925   CALCIUM 9.3 12/12/2016 0745   ALKPHOS 147 05/30/2017 0925   ALKPHOS 97 12/12/2016 0745   AST 13 05/30/2017 0925   AST 15 12/12/2016 0745    ALT 18 05/30/2017 0925   ALT 10 12/12/2016 0745   BILITOT 0.3 05/30/2017 0925   BILITOT 0.34 12/12/2016 0745       RADIOGRAPHIC STUDIES: I have personally reviewed the radiological images as listed and agreed with the findings in the report. Xr Lumbar Spine 2-3 Views  Result Date: 05/20/2017 2 view radiographs of the lumbar spine shows degenerative disc disease with old compression fractures at T11 and L5.   All questions were answered. The patient knows to call the clinic with any problems, questions or  concerns. No barriers to learning was detected.  I spent 25 minutes counseling the patient face to face. The total time spent in the appointment was 30 minutes and more than 50% was on counseling and review of test results  Heath Lark, MD 06/06/2017 1:00 PM

## 2017-06-06 NOTE — Assessment & Plan Note (Signed)
She has mild residual peripheral neuropathy but not worse We will observe and proceed with dose reduce chemotherapy as discussed.

## 2017-06-06 NOTE — Assessment & Plan Note (Signed)
She has profound pancytopenia despite aggressive dose adjustment We would proceed with treatment today with GCSF support

## 2017-06-06 NOTE — Assessment & Plan Note (Signed)
She will continue reduced dose cisplatin, currently stable

## 2017-06-06 NOTE — Progress Notes (Signed)
Ok to treat with Gemzar today with potassium at 5.3 per Dr. Alvy Bimler

## 2017-06-06 NOTE — Patient Instructions (Signed)
[facility] Discharge Instructions for Patients Receiving Chemotherapy  Today you received the following chemotherapy agents: Gemcitabine (Gemzar)  To help prevent nausea and vomiting after your treatment, we encourage you to take your nausea medication as prescribed.    If you develop nausea and vomiting that is not controlled by your nausea medication, call the clinic.   BELOW ARE SYMPTOMS THAT SHOULD BE REPORTED IMMEDIATELY:  *FEVER GREATER THAN 100.5 F  *CHILLS WITH OR WITHOUT FEVER  NAUSEA AND VOMITING THAT IS NOT CONTROLLED WITH YOUR NAUSEA MEDICATION  *UNUSUAL SHORTNESS OF BREATH  *UNUSUAL BRUISING OR BLEEDING  TENDERNESS IN MOUTH AND THROAT WITH OR WITHOUT PRESENCE OF ULCERS  *URINARY PROBLEMS  *BOWEL PROBLEMS  UNUSUAL RASH Items with * indicate a potential emergency and should be followed up as soon as possible.  Feel free to call the clinic should you have any questions or concerns. The clinic phone number is [phone].  Please show the CHEMO ALERT CARD at check-in to the Emergency Department and triage nurse.   

## 2017-06-06 NOTE — Assessment & Plan Note (Signed)
She is not symptomatic from lung metastasis.  Observe.

## 2017-06-06 NOTE — Telephone Encounter (Signed)
Gave avs and calendar ° °

## 2017-06-07 ENCOUNTER — Inpatient Hospital Stay: Payer: Medicare HMO

## 2017-06-07 DIAGNOSIS — C539 Malignant neoplasm of cervix uteri, unspecified: Secondary | ICD-10-CM

## 2017-06-07 DIAGNOSIS — D61818 Other pancytopenia: Secondary | ICD-10-CM | POA: Diagnosis not present

## 2017-06-07 DIAGNOSIS — C7801 Secondary malignant neoplasm of right lung: Secondary | ICD-10-CM | POA: Diagnosis not present

## 2017-06-07 DIAGNOSIS — R112 Nausea with vomiting, unspecified: Secondary | ICD-10-CM | POA: Diagnosis not present

## 2017-06-07 DIAGNOSIS — N390 Urinary tract infection, site not specified: Secondary | ICD-10-CM | POA: Diagnosis not present

## 2017-06-07 DIAGNOSIS — Z5111 Encounter for antineoplastic chemotherapy: Secondary | ICD-10-CM | POA: Diagnosis not present

## 2017-06-07 DIAGNOSIS — R319 Hematuria, unspecified: Secondary | ICD-10-CM | POA: Diagnosis not present

## 2017-06-07 DIAGNOSIS — C787 Secondary malignant neoplasm of liver and intrahepatic bile duct: Secondary | ICD-10-CM

## 2017-06-07 DIAGNOSIS — C799 Secondary malignant neoplasm of unspecified site: Secondary | ICD-10-CM

## 2017-06-07 DIAGNOSIS — C7802 Secondary malignant neoplasm of left lung: Secondary | ICD-10-CM | POA: Diagnosis not present

## 2017-06-07 DIAGNOSIS — C7989 Secondary malignant neoplasm of other specified sites: Secondary | ICD-10-CM

## 2017-06-07 MED ORDER — PEGFILGRASTIM-CBQV 6 MG/0.6ML ~~LOC~~ SOSY
6.0000 mg | PREFILLED_SYRINGE | Freq: Once | SUBCUTANEOUS | Status: AC
  Administered 2017-06-07: 6 mg via SUBCUTANEOUS

## 2017-06-07 MED ORDER — PEGFILGRASTIM-CBQV 6 MG/0.6ML ~~LOC~~ SOSY
PREFILLED_SYRINGE | SUBCUTANEOUS | Status: AC
  Filled 2017-06-07: qty 0.6

## 2017-06-07 NOTE — Patient Instructions (Signed)
Pegfilgrastim injection What is this medicine? PEGFILGRASTIM (PEG fil gra stim) is a long-acting granulocyte colony-stimulating factor that stimulates the growth of neutrophils, a type of white blood cell important in the body's fight against infection. It is used to reduce the incidence of fever and infection in patients with certain types of cancer who are receiving chemotherapy that affects the bone marrow, and to increase survival after being exposed to high doses of radiation. This medicine may be used for other purposes; ask your health care provider or pharmacist if you have questions. COMMON BRAND NAME(S): Neulasta What should I tell my health care provider before I take this medicine? They need to know if you have any of these conditions: -kidney disease -latex allergy -ongoing radiation therapy -sickle cell disease -skin reactions to acrylic adhesives (On-Body Injector only) -an unusual or allergic reaction to pegfilgrastim, filgrastim, other medicines, foods, dyes, or preservatives -pregnant or trying to get pregnant -breast-feeding How should I use this medicine? This medicine is for injection under the skin. If you get this medicine at home, you will be taught how to prepare and give the pre-filled syringe or how to use the On-body Injector. Refer to the patient Instructions for Use for detailed instructions. Use exactly as directed. Tell your healthcare provider immediately if you suspect that the On-body Injector may not have performed as intended or if you suspect the use of the On-body Injector resulted in a missed or partial dose. It is important that you put your used needles and syringes in a special sharps container. Do not put them in a trash can. If you do not have a sharps container, call your pharmacist or healthcare provider to get one. Talk to your pediatrician regarding the use of this medicine in children. While this drug may be prescribed for selected conditions,  precautions do apply. Overdosage: If you think you have taken too much of this medicine contact a poison control center or emergency room at once. NOTE: This medicine is only for you. Do not share this medicine with others. What if I miss a dose? It is important not to miss your dose. Call your doctor or health care professional if you miss your dose. If you miss a dose due to an On-body Injector failure or leakage, a new dose should be administered as soon as possible using a single prefilled syringe for manual use. What may interact with this medicine? Interactions have not been studied. Give your health care provider a list of all the medicines, herbs, non-prescription drugs, or dietary supplements you use. Also tell them if you smoke, drink alcohol, or use illegal drugs. Some items may interact with your medicine. This list may not describe all possible interactions. Give your health care provider a list of all the medicines, herbs, non-prescription drugs, or dietary supplements you use. Also tell them if you smoke, drink alcohol, or use illegal drugs. Some items may interact with your medicine. What should I watch for while using this medicine? You may need blood work done while you are taking this medicine. If you are going to need a MRI, CT scan, or other procedure, tell your doctor that you are using this medicine (On-Body Injector only). What side effects may I notice from receiving this medicine? Side effects that you should report to your doctor or health care professional as soon as possible: -allergic reactions like skin rash, itching or hives, swelling of the face, lips, or tongue -dizziness -fever -pain, redness, or irritation at site   where injected -pinpoint red spots on the skin -red or dark-brown urine -shortness of breath or breathing problems -stomach or side pain, or pain at the shoulder -swelling -tiredness -trouble passing urine or change in the amount of urine Side  effects that usually do not require medical attention (report to your doctor or health care professional if they continue or are bothersome): -bone pain -muscle pain This list may not describe all possible side effects. Call your doctor for medical advice about side effects. You may report side effects to FDA at 1-800-FDA-1088. Where should I keep my medicine? Keep out of the reach of children. Store pre-filled syringes in a refrigerator between 2 and 8 degrees C (36 and 46 degrees F). Do not freeze. Keep in carton to protect from light. Throw away this medicine if it is left out of the refrigerator for more than 48 hours. Throw away any unused medicine after the expiration date. NOTE: This sheet is a summary. It may not cover all possible information. If you have questions about this medicine, talk to your doctor, pharmacist, or health care provider.  2018 Elsevier/Gold Standard (2016-02-16 12:58:03)  

## 2017-06-16 ENCOUNTER — Other Ambulatory Visit (INDEPENDENT_AMBULATORY_CARE_PROVIDER_SITE_OTHER): Payer: Self-pay | Admitting: Orthopedic Surgery

## 2017-06-27 ENCOUNTER — Inpatient Hospital Stay: Payer: Medicare HMO

## 2017-06-27 ENCOUNTER — Telehealth: Payer: Self-pay

## 2017-06-27 ENCOUNTER — Other Ambulatory Visit: Payer: Self-pay

## 2017-06-27 ENCOUNTER — Inpatient Hospital Stay (HOSPITAL_BASED_OUTPATIENT_CLINIC_OR_DEPARTMENT_OTHER): Payer: Medicare HMO | Admitting: Medical

## 2017-06-27 ENCOUNTER — Other Ambulatory Visit: Payer: Self-pay | Admitting: *Deleted

## 2017-06-27 DIAGNOSIS — D61818 Other pancytopenia: Secondary | ICD-10-CM | POA: Diagnosis not present

## 2017-06-27 DIAGNOSIS — Z9221 Personal history of antineoplastic chemotherapy: Secondary | ICD-10-CM

## 2017-06-27 DIAGNOSIS — Z5111 Encounter for antineoplastic chemotherapy: Secondary | ICD-10-CM | POA: Diagnosis not present

## 2017-06-27 DIAGNOSIS — C7802 Secondary malignant neoplasm of left lung: Secondary | ICD-10-CM

## 2017-06-27 DIAGNOSIS — R319 Hematuria, unspecified: Secondary | ICD-10-CM | POA: Diagnosis not present

## 2017-06-27 DIAGNOSIS — C539 Malignant neoplasm of cervix uteri, unspecified: Secondary | ICD-10-CM

## 2017-06-27 DIAGNOSIS — C787 Secondary malignant neoplasm of liver and intrahepatic bile duct: Secondary | ICD-10-CM | POA: Diagnosis not present

## 2017-06-27 DIAGNOSIS — C7801 Secondary malignant neoplasm of right lung: Secondary | ICD-10-CM

## 2017-06-27 DIAGNOSIS — N390 Urinary tract infection, site not specified: Secondary | ICD-10-CM

## 2017-06-27 DIAGNOSIS — C7989 Secondary malignant neoplasm of other specified sites: Secondary | ICD-10-CM

## 2017-06-27 DIAGNOSIS — C799 Secondary malignant neoplasm of unspecified site: Secondary | ICD-10-CM

## 2017-06-27 DIAGNOSIS — Z933 Colostomy status: Secondary | ICD-10-CM | POA: Diagnosis not present

## 2017-06-27 DIAGNOSIS — R112 Nausea with vomiting, unspecified: Secondary | ICD-10-CM

## 2017-06-27 DIAGNOSIS — Z79899 Other long term (current) drug therapy: Secondary | ICD-10-CM

## 2017-06-27 LAB — URINALYSIS, COMPLETE (UACMP) WITH MICROSCOPIC
Bilirubin Urine: NEGATIVE
GLUCOSE, UA: NEGATIVE mg/dL
Ketones, ur: NEGATIVE mg/dL
NITRITE: NEGATIVE
PH: 5 (ref 5.0–8.0)
Protein, ur: 100 mg/dL — AB
SPECIFIC GRAVITY, URINE: 1.011 (ref 1.005–1.030)
WBC, UA: >50 WBC/hpf — ABNORMAL HIGH (ref 0–5)

## 2017-06-27 LAB — CBC WITH DIFFERENTIAL/PLATELET
BASOS ABS: 0 10*3/uL (ref 0.0–0.1)
Basophils Relative: 0 %
EOS ABS: 0.1 10*3/uL (ref 0.0–0.5)
Eosinophils Relative: 1 %
HCT: 30.9 % — ABNORMAL LOW (ref 34.8–46.6)
HEMOGLOBIN: 10 g/dL — AB (ref 11.6–15.9)
LYMPHS ABS: 1.3 10*3/uL (ref 0.9–3.3)
LYMPHS PCT: 12 %
MCH: 30.4 pg (ref 25.1–34.0)
MCHC: 32.4 g/dL (ref 31.5–36.0)
MCV: 93.9 fL (ref 79.5–101.0)
Monocytes Absolute: 1.4 10*3/uL — ABNORMAL HIGH (ref 0.1–0.9)
Monocytes Relative: 13 %
NEUTROS PCT: 74 %
Neutro Abs: 8.1 10*3/uL — ABNORMAL HIGH (ref 1.5–6.5)
Platelets: 189 10*3/uL (ref 145–400)
RBC: 3.29 MIL/uL — AB (ref 3.70–5.45)
RDW: 17.4 % — ABNORMAL HIGH (ref 11.2–14.5)
WBC: 10.9 10*3/uL — AB (ref 3.9–10.3)

## 2017-06-27 LAB — COMPREHENSIVE METABOLIC PANEL
ALBUMIN: 3.6 g/dL (ref 3.5–5.0)
ALK PHOS: 119 U/L (ref 40–150)
ALT: 16 U/L (ref 0–55)
AST: 15 U/L (ref 5–34)
Anion gap: 8 (ref 3–11)
BUN: 27 mg/dL — AB (ref 7–26)
CALCIUM: 9.8 mg/dL (ref 8.4–10.4)
CHLORIDE: 110 mmol/L — AB (ref 98–109)
CO2: 24 mmol/L (ref 22–29)
CREATININE: 1.89 mg/dL — AB (ref 0.60–1.10)
GFR calc non Af Amer: 26 mL/min — ABNORMAL LOW (ref >60)
GFR, EST AFRICAN AMERICAN: 30 mL/min — AB (ref >60)
GLUCOSE: 101 mg/dL (ref 70–140)
Potassium: 4.3 mmol/L (ref 3.5–5.1)
SODIUM: 142 mmol/L (ref 136–145)
Total Bilirubin: 0.5 mg/dL (ref 0.2–1.2)
Total Protein: 7.1 g/dL (ref 6.4–8.3)

## 2017-06-27 MED ORDER — ONDANSETRON HCL 4 MG/2ML IJ SOLN
INTRAMUSCULAR | Status: AC
  Filled 2017-06-27: qty 4

## 2017-06-27 MED ORDER — LEVOFLOXACIN IN D5W 500 MG/100ML IV SOLN
500.0000 mg | Freq: Once | INTRAVENOUS | Status: AC
  Administered 2017-06-27: 500 mg via INTRAVENOUS
  Filled 2017-06-27: qty 100

## 2017-06-27 MED ORDER — SODIUM CHLORIDE 0.9% FLUSH
10.0000 mL | Freq: Once | INTRAVENOUS | Status: AC
  Administered 2017-06-27: 10 mL
  Filled 2017-06-27: qty 10

## 2017-06-27 MED ORDER — ONDANSETRON HCL 4 MG/2ML IJ SOLN
8.0000 mg | Freq: Once | INTRAMUSCULAR | Status: AC
  Administered 2017-06-27: 8 mg via INTRAVENOUS

## 2017-06-27 MED ORDER — SULFAMETHOXAZOLE-TRIMETHOPRIM 800-160 MG PO TABS: 1.0000 | ORAL_TABLET | Freq: Two times a day (BID) | ORAL | 0 refills | Status: DC

## 2017-06-27 NOTE — Patient Instructions (Addendum)

## 2017-06-27 NOTE — Telephone Encounter (Signed)
Lab came over to let Dr Alvy Bimler know pt is in lobby, doesn't feel well today.  Went out to see pt, she reports painful urination, low back pain, nausea, swelling in her legs, and said "I think I have an infection".  U/A, culture collected by lab.  Scheduled appt with Symptom Management, pt has infusion scheduled today, notified charge nurse in infusion, and Sandi Mealy PA will see pt in infusion.

## 2017-06-27 NOTE — Progress Notes (Signed)
Patient refused infusions today.  Patient stated, "I just feel too bad to get treatment today."  VS obtained and assessment obtained per flowsheets.  Sandi Mealy, PA-C saw patient in infusion area.  New orders for Zofran 8mg  and Levaquin IV placed per PA.  Patient tolerating medications well at this time.

## 2017-06-28 NOTE — Progress Notes (Signed)
These results were called to [employer]. A message was left as she did not answer. Sandi Mealy

## 2017-06-28 NOTE — Progress Notes (Signed)
[facility] Progress Note   Katherine Boone [id number] 15-Jun-1945 72 y.o.  Katherine Boone is managed by Dr. Heath Lark  Actively treated with chemotherapy: yes  Current Therapy: Cisplatin and gemcitabine  Last Treated: 06/06/2017 (cycle 3, day 8)  Assessment: Plan:    Urinary tract infection with hematuria, site unspecified - Plan: levofloxacin (LEVAQUIN) IVPB 500 mg  Non-intractable vomiting with nausea, unspecified vomiting type - Plan: ondansetron (ZOFRAN) injection 8 mg   Urinary tract infection with hematuria: A urinalysis, complete blood count and chemistry panel were collected today.  The patient's complete blood count returned with a white blood count elevated 10.9 with an ANC of 8.1.  Her chemistry panel returned showing a BUN of 27 and a creatinine of 1.89.  The patient's urinalysis returned with a urine that was cloudy, moderate heme positive, increased protein,, rare bacteria and a large number of white blood cells.  A culture was submitted.  The patient was given Levaquin 500 mg IV x1.  She was given a prescription for Bactrim DS, 1 p.o. twice daily x7 days.  The patient request to forego her chemotherapy this week.  Nausea and vomiting: The patient was given Zofran 8 mg IV x1  Please see After Visit Summary for patient specific instructions.  Future Appointments  Date Time Provider [place]  07/04/2017 12:00 PM [facility] LAB 1 [facility] None  07/04/2017 12:15 PM [facility] FLUSH NURSE [facility] None  07/04/2017 12:45 PM Gorsuch, Ni, MD [facility] None  07/04/2017  2:00 PM [facility] E15 [facility] None  07/06/2017 11:15 AM [facility] INJ NURSE [facility] None    No orders of the defined types were placed in this encounter.      Subjective:   Patient ID:  Katherine Boone is a 72 y.o. (DOB [birth year]) female.  Chief Complaint: No chief complaint on file.   HPI Katherine Boone is a 72 year old female with a history of a metastatic  high-grade poorly differentiated cervical cancer with liver lesions, omental nodules, mesenteric nodules, and necrotic retroperitoneal lymphadenopathy who presents to the office today for cycle 3, day 8 of cisplatin and gemcitabine.  The patient is having swelling of her ankles bilaterally, episodic urinary incontinence, right flank pain, foul-smelling urine, hot flashes, and chills.  She reports that her urinary symptoms have been going on for "a couple of weeks".  She is having nausea but no vomiting.  She request to have her chemotherapy delayed today.  Medications: I have reviewed the patient's current medications.  Allergies: No Known Allergies  Past Medical History:  Diagnosis Date  . Cancer (HCC)    Cervical  . Family history of cancer    stomach, colon, lung, throat  . GERD (gastroesophageal reflux disease)    diet controlled  . Gout 03/2005   ?  Marland Kitchen Large bowel obstruction ([place]) 05/28/2015  . Migraines    none recently  . Osteoporosis 02/2011   DEXA 12/12 - Lspine -2.6; femur -3.8  . Small bowel obstruction [facility])     Past Surgical History:  Procedure Laterality Date  . APPENDECTOMY  1960  . LAPAROSCOPIC DIVERTED COLOSTOMY N/A 05/18/2015   Procedure: LAPAROSCOPIC DIVERTED LOOP COLOSTOMY ;  Surgeon: Leighton Ruff, MD;  Location: [facility];  Service: General;  Laterality: N/A;    Family History  Problem Relation Age of Onset  . Heart disease Mother   . Hypertension Mother   . Cancer Mother 41       colon and stomach  . Cancer  Brother        stomach and throat, smoker  . Hypertension Sister   . Cancer Brother        stomach and lung, smoker  . COPD Brother   . Cancer Father        stomach cancer  . Ovarian cancer Unknown        maternal neice  . Throat cancer Brother   . Stroke Sister   . Cancer Other        niece - stomach cancer  . Diabetes Neg Hx   . Coronary artery disease Neg Hx     Social History   Socioeconomic History  . Marital status: Divorced     Spouse name: Not on file  . Number of children: 2  . Years of education: Not on file  . Highest education level: Not on file  Occupational History    Employer: [employer]  Social Needs  . Financial resource strain: Not on file  . Food insecurity:    Worry: Not on file    Inability: Not on file  . Transportation needs:    Medical: Not on file    Non-medical: Not on file  Tobacco Use  . Smoking status: Never Smoker  . Smokeless tobacco: Never Used  Substance and Sexual Activity  . Alcohol use: No  . Drug use: No  . Sexual activity: Never  Lifestyle  . Physical activity:    Days per week: Not on file    Minutes per session: Not on file  . Stress: Not on file  Relationships  . Social connections:    Talks on phone: Not on file    Gets together: Not on file    Attends religious service: Not on file    Active member of club or organization: Not on file    Attends meetings of clubs or organizations: Not on file    Relationship status: Not on file  . Intimate partner violence:    Fear of current or ex partner: Not on file    Emotionally abused: Not on file    Physically abused: Not on file    Forced sexual activity: Not on file  Other Topics Concern  . 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   Please see review of systems for further details on the patient's review from today.   Review of Systems:  Review of Systems  Constitutional: Negative for chills, diaphoresis and fever.  Respiratory:  Negative for cough, chest tightness, shortness of breath and wheezing.   Cardiovascular: Positive for leg swelling. Negative for chest pain and palpitations.  Gastrointestinal: Positive for nausea. Negative for constipation, diarrhea and vomiting.  Genitourinary: Positive for dysuria, flank pain, frequency and urgency. Negative for decreased urine volume, difficulty urinating and hematuria.       Episodic urinary incontinence    Objective:   Physical Exam:  There were no vitals taken for this visit. ECOG: 1  Physical Exam  Constitutional: No  distress.  HENT:  Head: Normocephalic and atraumatic.  Cardiovascular: Normal rate, regular rhythm and normal heart sounds. Exam reveals no gallop and no friction rub.  No murmur heard. Pulmonary/Chest: Effort normal and breath sounds normal. No respiratory distress. She has no wheezes. She has no rales.  Musculoskeletal: She exhibits edema (Trace bilateral lower extremity edema of the ankles.) and tenderness (Bilateral CVA tenderness greater on the right than left).  Neurological: She is alert. Coordination normal.  Skin: Skin is warm and dry. She is not diaphoretic.  Psychiatric: She has a normal mood and affect. Her behavior is normal. Judgment and thought content normal.    Lab Review:     Component Value Date/Time   NA 142 06/27/2017 0835   NA 142 12/12/2016 0745   K 4.3 06/27/2017 0835   K 4.5 12/12/2016 0745   CL 110 (H) 06/27/2017 0835   CO2 24 06/27/2017 0835   CO2 24 12/12/2016 0745   GLUCOSE 101 06/27/2017 0835   GLUCOSE 93 12/12/2016 0745   BUN 27 (H) 06/27/2017 0835   BUN 13.8 12/12/2016 0745   CREATININE 1.89 (H) 06/27/2017 0835   CREATININE 1.1 12/12/2016 0745   CALCIUM 9.8 06/27/2017 0835   CALCIUM 9.3 12/12/2016 0745   PROT 7.1 06/27/2017 0835   PROT 6.9 12/12/2016 0745   ALBUMIN 3.6 06/27/2017 0835   ALBUMIN 3.6 12/12/2016 0745   AST 15 06/27/2017 0835   AST 15 12/12/2016 0745   ALT 16 06/27/2017 0835   ALT 10  12/12/2016 0745   ALKPHOS 119 06/27/2017 0835   ALKPHOS 97 12/12/2016 0745   BILITOT 0.5 06/27/2017 0835   BILITOT 0.34 12/12/2016 0745   GFRNONAA 26 (L) 06/27/2017 0835   GFRAA 30 (L) 06/27/2017 0835       Component Value Date/Time   WBC 10.9 (H) 06/27/2017 0835   RBC 3.29 (L) 06/27/2017 0835   HGB 10.0 (L) 06/27/2017 0835   HGB 10.2 (L) 12/12/2016 0745   HCT 30.9 (L) 06/27/2017 0835   HCT 30.5 (L) 12/12/2016 0745   PLT 189 06/27/2017 0835   PLT 219 12/12/2016 0745   MCV 93.9 06/27/2017 0835   MCV 90.8 12/12/2016 0745   MCH 30.4 06/27/2017 0835   MCHC 32.4 06/27/2017 0835   RDW 17.4 (H) 06/27/2017 0835   RDW 15.4 (H) 12/12/2016 0745   LYMPHSABS 1.3 06/27/2017 0835   LYMPHSABS 1.6 12/12/2016 0745   MONOABS 1.4 (H) 06/27/2017 0835   MONOABS 0.4 12/12/2016 0745   EOSABS 0.1 06/27/2017 0835   EOSABS 0.1 12/12/2016 0745   BASOSABS 0.0 06/27/2017 0835   BASOSABS 0.0 12/12/2016 0745   -------------------------------  Imaging from last 24 hours (if applicable):  Radiology interpretation: No results found.

## 2017-06-29 LAB — URINE CULTURE

## 2017-07-01 NOTE — Progress Notes (Signed)
These results were called to Silvana Newness and were reviewed with her . Her were answered. She expressed understanding. She continues on Bactrim.

## 2017-07-04 ENCOUNTER — Other Ambulatory Visit: Payer: Self-pay | Admitting: *Deleted

## 2017-07-04 ENCOUNTER — Other Ambulatory Visit: Payer: Self-pay | Admitting: Hematology and Oncology

## 2017-07-04 ENCOUNTER — Inpatient Hospital Stay: Payer: Medicare HMO

## 2017-07-04 ENCOUNTER — Encounter: Payer: Self-pay | Admitting: Hematology and Oncology

## 2017-07-04 ENCOUNTER — Inpatient Hospital Stay: Payer: Medicare HMO | Attending: Hematology and Oncology

## 2017-07-04 ENCOUNTER — Telehealth: Payer: Self-pay | Admitting: Hematology and Oncology

## 2017-07-04 ENCOUNTER — Inpatient Hospital Stay (HOSPITAL_BASED_OUTPATIENT_CLINIC_OR_DEPARTMENT_OTHER): Payer: Medicare HMO | Admitting: Hematology and Oncology

## 2017-07-04 ENCOUNTER — Telehealth: Payer: Self-pay

## 2017-07-04 VITALS — BP 117/71 | HR 84 | Temp 98.5°F | Resp 18 | Ht 64.5 in | Wt 169.2 lb

## 2017-07-04 DIAGNOSIS — C787 Secondary malignant neoplasm of liver and intrahepatic bile duct: Secondary | ICD-10-CM

## 2017-07-04 DIAGNOSIS — C7801 Secondary malignant neoplasm of right lung: Secondary | ICD-10-CM | POA: Diagnosis not present

## 2017-07-04 DIAGNOSIS — Z79899 Other long term (current) drug therapy: Secondary | ICD-10-CM | POA: Insufficient documentation

## 2017-07-04 DIAGNOSIS — C539 Malignant neoplasm of cervix uteri, unspecified: Secondary | ICD-10-CM

## 2017-07-04 DIAGNOSIS — Z66 Do not resuscitate: Secondary | ICD-10-CM | POA: Insufficient documentation

## 2017-07-04 DIAGNOSIS — Z933 Colostomy status: Secondary | ICD-10-CM | POA: Diagnosis not present

## 2017-07-04 DIAGNOSIS — C799 Secondary malignant neoplasm of unspecified site: Secondary | ICD-10-CM

## 2017-07-04 DIAGNOSIS — D61818 Other pancytopenia: Secondary | ICD-10-CM

## 2017-07-04 DIAGNOSIS — N184 Chronic kidney disease, stage 4 (severe): Secondary | ICD-10-CM | POA: Diagnosis not present

## 2017-07-04 DIAGNOSIS — Z9221 Personal history of antineoplastic chemotherapy: Secondary | ICD-10-CM | POA: Diagnosis not present

## 2017-07-04 DIAGNOSIS — C7802 Secondary malignant neoplasm of left lung: Secondary | ICD-10-CM | POA: Diagnosis not present

## 2017-07-04 DIAGNOSIS — N183 Chronic kidney disease, stage 3 unspecified: Secondary | ICD-10-CM

## 2017-07-04 DIAGNOSIS — G893 Neoplasm related pain (acute) (chronic): Secondary | ICD-10-CM

## 2017-07-04 DIAGNOSIS — C7989 Secondary malignant neoplasm of other specified sites: Secondary | ICD-10-CM

## 2017-07-04 LAB — CBC WITH DIFFERENTIAL/PLATELET
BASOS PCT: 0 %
Basophils Absolute: 0 10*3/uL (ref 0.0–0.1)
EOS ABS: 0.2 10*3/uL (ref 0.0–0.5)
Eosinophils Relative: 4 %
HEMATOCRIT: 29.7 % — AB (ref 34.8–46.6)
HEMOGLOBIN: 9.8 g/dL — AB (ref 11.6–15.9)
LYMPHS ABS: 1 10*3/uL (ref 0.9–3.3)
Lymphocytes Relative: 22 %
MCH: 30.4 pg (ref 25.1–34.0)
MCHC: 33.1 g/dL (ref 31.5–36.0)
MCV: 92 fL (ref 79.5–101.0)
MONO ABS: 0.5 10*3/uL (ref 0.1–0.9)
MONOS PCT: 11 %
NEUTROS ABS: 2.8 10*3/uL (ref 1.5–6.5)
Neutrophils Relative %: 63 %
Platelets: 190 10*3/uL (ref 145–400)
RBC: 3.23 MIL/uL — ABNORMAL LOW (ref 3.70–5.45)
RDW: 18.2 % — AB (ref 11.2–14.5)
WBC: 4.5 10*3/uL (ref 3.9–10.3)

## 2017-07-04 LAB — COMPREHENSIVE METABOLIC PANEL
ALBUMIN: 3.8 g/dL (ref 3.5–5.0)
ALK PHOS: 118 U/L (ref 40–150)
ALT: 11 U/L (ref 0–55)
ANION GAP: 7 (ref 3–11)
AST: 17 U/L (ref 5–34)
BILIRUBIN TOTAL: 0.3 mg/dL (ref 0.2–1.2)
BUN: 23 mg/dL (ref 7–26)
CALCIUM: 9.6 mg/dL (ref 8.4–10.4)
CO2: 23 mmol/L (ref 22–29)
Chloride: 108 mmol/L (ref 98–109)
Creatinine, Ser: 2.22 mg/dL — ABNORMAL HIGH (ref 0.60–1.10)
GFR calc non Af Amer: 21 mL/min — ABNORMAL LOW (ref >60)
GFR, EST AFRICAN AMERICAN: 24 mL/min — AB (ref >60)
GLUCOSE: 99 mg/dL (ref 70–140)
POTASSIUM: 4.2 mmol/L (ref 3.5–5.1)
Sodium: 138 mmol/L (ref 136–145)
TOTAL PROTEIN: 7.3 g/dL (ref 6.4–8.3)

## 2017-07-04 MED ORDER — HYDROMORPHONE HCL 4 MG PO TABS: 4.0000 mg | ORAL_TABLET | ORAL | 0 refills | Status: DC | PRN

## 2017-07-04 MED ORDER — ALTEPLASE 2 MG IJ SOLR
INTRAMUSCULAR | Status: AC
  Filled 2017-07-04: qty 2

## 2017-07-04 MED ORDER — SODIUM CHLORIDE 0.9% FLUSH
10.0000 mL | Freq: Once | INTRAVENOUS | Status: AC
  Administered 2017-07-04: 10 mL
  Filled 2017-07-04: qty 10

## 2017-07-04 MED ORDER — ALTEPLASE 2 MG IJ SOLR
2.0000 mg | Freq: Once | INTRAMUSCULAR | Status: DC
  Filled 2017-07-04: qty 2

## 2017-07-04 NOTE — Assessment & Plan Note (Signed)
She has chronic intermittent pancytopenia due to side effects from treatment She is not symptomatic with anemia today Observe only

## 2017-07-04 NOTE — Progress Notes (Signed)
[place] OFFICE PROGRESS NOTE  Patient Care Team: [name], MD as PCP - General (Family Medicine) Carmel Sacramento, OD as Consulting Physician (Optometry)  ASSESSMENT & PLAN:  Cervical cancer [facility]) The patient complained of severe bilateral flank pain despite trial of antibiotic treatment It is very suspicious for cancer progression causing possibility of hydronephrosis given recent abnormal kidney function I recommend stopping chemotherapy today and reevaluate with CT scan of the chest, abdomen and pelvis for assessment She agree with the plan of care Plan to see her back next week to review test results  Malignant neoplasm metastatic to left lung [facility]) She has mild bilateral wheezes but denies cough, chest pain or shortness of breath I recommend CT imaging to exclude cancer progression in the lungs  Pancytopenia, acquired [facility]) She has chronic intermittent pancytopenia due to side effects from treatment She is not symptomatic with anemia today Observe only  Chronic kidney disease, stage III (moderate) She has exacerbation on acute on chronic renal failure, could be due to cancer progression versus dehydration I encouraged her to increase oral fluid as tolerated I will order CT scan with IV contrast  Cancer associated pain She has severe bilateral flank pain, worrisome for cancer associated pain I recommend avoiding NSAID due to renal failure Tylenol is not helping I recommend low-dose Dilaudid as needed I warned her about risk of nausea and constipation I will reassess pain control next week   Orders Placed This Encounter  Procedures  . CT Abdomen Pelvis Wo Contrast    Standing Status:   Future    Standing Expiration Date:   07/04/2018    Order Specific Question:   Preferred imaging location?    Answer:   [facility]    Order Specific Question:   Is Oral Contrast requested for this exam?    Answer:   Yes, Per Radiology protocol    Order  Specific Question:   Radiology Contrast Protocol - do NOT remove file path    Answer:   \\[email or web address]  . CT Chest Wo Contrast    Standing Status:   Future    Standing Expiration Date:   07/04/2018    Order Specific Question:   Preferred imaging location?    Answer:   [facility]    Order Specific Question:   Radiology Contrast Protocol - do NOT remove file path    Answer:   \\[email or web address]    INTERVAL HISTORY: Please see below for problem oriented charting. She returns for chemotherapy today She was seen last week in my absence due to bilateral flank pain, worrisome for urinary tract infection She was prescribed antibiotic without alleviation of her symptoms She continues to feel bad with severe flank pain, inability to lie flat She was taking NSAID at home without much success of controlling her pain She had some nausea but no vomiting She has not taken any antiemetics recently Denies constipation Denies chest pain, shortness of breath or cough She denies hematuria  SUMMARY OF ONCOLOGIC HISTORY: Oncology History   PD-L 1 testing neg     Cervical cancer ([place])   04/26/2015 Imaging    Numerous bilateral pulmonary nodules consistent with metastatic disease are associated with liver lesions, omental nodules, mesenteric nodules, necrotic retroperitoneal lymphadenopathy in a mixed cystic and solid mass in the central pelvis involving the uterus extending into the cul-de-sac and both adnexal regions. Uterine primary (likely endometrial) is favored over an ovarian primary malignancy given the lack of ascites, associated intrahepatic  metastases and pulmonary involvement.      05/04/2015 Procedure    Technically successful CT-guided core biopsy of left pelvic mass      05/04/2015 Pathology Results    Soft Tissue Needle Core Biopsy, left ext iliac mass METASTATIC HIGH GRADE POORLY DIFFERENTIATED CARCINOMA CONSISTENT WITH  CERVICAL ORIGIN Microscopic Comment The neoplasm stains positive for cervical markers : p16, ER, ck8/18, and p63 (focal), TTF-1 (focal) and negative for synaptophysin, Chromogranin (neuroendocrine markers) and ck5/6. The morphology and immunohistochemistry staining pattern support these cells are GYN cervical origin.Spec      05/10/2015 Imaging    Innumerable (> than 40) pulmonary nodules randomly distributed throughout both lungs, largest 1.1 cm, in keeping with pulmonary metastases. 2. Left supraclavicular and left hilar nodal metastases. 3. Re- demonstration of liver metastases. 4. Right thyroid lobe 1.4 cm pulmonary nodule, for which no further evaluation is recommended. 5. Stable mild superior T11 vertebral compression deformity.      05/12/2015 - 05/12/2015 Chemotherapy    She received 1 dose of carbo/taxol only      05/15/2015 Imaging    Widely metastatic cervical carcinoma with unchanged appearance compared to 04/26/2015. Bulky tumor narrows and likely invades the rectum/distal sigmoid with progressive proximal stool retention      05/15/2015 - 05/23/2015 Hospital Admission    She was admitted for severe bowel obstruction requiring urgent diversion colostomy      05/18/2015 Surgery    She underwent LAPAROSCOPIC DIVERTING SIGMOID LOOP COLOSTOMY for bowel obstruction       06/10/2015 Procedure    Successful placement of a right internal jugular approach power injectable Port-A-Cath. The catheter is ready for immediate use      06/23/2015 - 03/01/2016 Chemotherapy    She received combination treatment with cisplatin, Taxol and Avastin. Avastin was started on 5/11. Dose of Taxol was modified due to neuropathy and cisplatin was omited last dose due to 'weakness'      09/13/2015 Imaging    Significant interval treatment response. Pulmonary and liver metastases have significantly decreased in size. Thoracic adenopathy has decreased in size. Mixed attenuation mass encompassing the uterus and  bilateral adnexal regions has significantly decreased in size. No residual visualized peritoneal tumor implants. No new or progressive metastatic disease. 2. No evidence of bowel obstruction or acute bowel inflammation status post diverting sigmoid colostomy. 3. Additional findings include mild aortic atherosclerosis and Stable mild chronic superior T12 compression fracture.      12/14/2015 Imaging    Stable to improved interval exam. Multiple bilateral pulmonary nodules are stable in size to decreased and the liver lesions are stable to decreased. Mixed attenuation lesion encompassing the uterus and adnexal region shows decrease in size. There is no evidence for new or progressive disease on today's exam.      05/15/2016 Imaging    Findings today reflecting mixed interval response to therapy. 2. There has been mild increase in size of pulmonary nodules. 3. Interval increase in size of pelvic mass 4. Further regression of liver metastases 5. There is a new soft tissue nodule within the right lower quadrant peritoneal cavity. Suspicious peritoneal disease      06/07/2016 -  Chemotherapy    She received chemo with cisplatin and gemzar      06/14/2016 Adverse Reaction    Cycle 1, day 8 is put on hold due to severe pancytopenia      08/29/2016 Imaging    Ct scan of chest, abdomen and pelvis: 1. General improvement, with reduced size  of the scattered pulmonary nodules, and also a much more normal contour and appearance of the uterus, without the visible enhancing irregular mass shown on the 05/15/2016 exam involving the uterus and cervix. There still some mild soft tissue prominence of the cervix, and a cystic lesion of the left ovary as noted above. No pathologic adenopathy. 2. The right lower quadrant omental nodule has considerably improved and nearly resolved. 3. The prior hepatic metastatic lesions have essentially resolved, with only minimal residual linear hypodensity in segment 4 at the site of  the prior large metastatic lesion shown on 05/15/2015.  4. Other imaging findings of potential clinical significance: Left lower quadrant colostomy. Chronic superior endplate compression at T11. Bony demineralization. Trace free pelvic fluid in the cul-de-sac. Central disc protrusion at L3-4. Left mid kidney hypodense lesion is likely a cyst.      12/12/2016 Imaging    1. Suspected minimal enlargement of at least 1 pulmonary nodule. The majority of pulmonary nodules are similar. Cannot exclude minimal pulmonary metastasis progression. 2. No evidence of extrathoracic progressive metastatic disease. Similar soft tissue fullness within the uterine cervix and similar to slight decrease in size of a tiny omental nodule. 3. Aortic Atherosclerosis (ICD10-I70.0). 4. Chronic T10 compression deformity      03/13/2017 Imaging    1. Interval progression of pre-existing pulmonary metastases with interval development of new pulmonary metastatic lesions. 2. Interval development of new and progression of pre-existing omental metastases. 3. Marked interval progression of cervical disease with dilated fluid-filled endometrial canal in the uterine fundus. The cervical disease infiltrates into the lower uterine segment and likely extends into the adnexal space bilaterally. Posterior extent of cervical disease is tethered to the distal sigmoid colon in this patient with a sigmoid loop colostomy. 4. Stable atrophy of the medial segment left liver.      03/22/2017 -  Chemotherapy    The patient had chemo cisplatini and gemzar       04/26/2017 Imaging    1. Bladder wall irregularity anteriorly, suspicious for cystitis. No specific evidence of fistula to uterus or vagina. Voiding cystogram would likely be of greatest sensitivity. 2. Similar to slight decrease in size of infiltrative mass centered about the lower uterine segment. Presumed sigmoid colonic wall involvement.       REVIEW OF SYSTEMS:   Constitutional:  Denies fevers, chills or abnormal weight loss Eyes: Denies blurriness of vision Ears, nose, mouth, throat, and face: Denies mucositis or sore throat Respiratory: Denies cough, dyspnea or wheezes Cardiovascular: Denies palpitation, chest discomfort or lower extremity swelling Skin: Denies abnormal skin rashes Lymphatics: Denies new lymphadenopathy or easy bruising Neurological:Denies numbness, tingling or new weaknesses Behavioral/Psych: Mood is stable, no new changes  All other systems were reviewed with the patient and are negative.  I have reviewed the past medical history, past surgical history, social history and family history with the patient and they are unchanged from previous note.  ALLERGIES:  has No Known Allergies.  MEDICATIONS:  Current Outpatient Medications  Medication Sig Dispense Refill  . cholecalciferol (VITAMIN D) 1000 units tablet Take 1,000 Units by mouth daily.    Marland Kitchen HYDROmorphone (DILAUDID) 4 MG tablet Take 1 tablet (4 mg total) by mouth every 4 (four) hours as needed for severe pain. 30 tablet 0  . lidocaine-prilocaine (EMLA) cream Apply 1 application topically as needed. 30 g 6  . methocarbamol (ROBAXIN) 500 MG tablet Take 1 tablet (500 mg total) by mouth every 8 (eight) hours as needed for muscle spasms.  30 tablet 0  . omeprazole (PRILOSEC) 20 MG capsule TAKE 1 CAPSULE BY MOUTH EVERY DAY 90 capsule 0  . ondansetron (ZOFRAN) 8 MG tablet Take 1 tablet (8 mg total) by mouth every 8 (eight) hours as needed for nausea. 30 tablet 3  . oxybutynin (DITROPAN-XL) 5 MG 24 hr tablet TAKE 1 TABLET BY MOUTH EVERYDAY AT BEDTIME 30 tablet 3  . prochlorperazine (COMPAZINE) 10 MG tablet Take 1 tablet (10 mg total) by mouth every 6 (six) hours as needed for nausea or vomiting. 30 tablet 1  . temazepam (RESTORIL) 15 MG capsule Take 15 mg by mouth at bedtime as needed for sleep.     No current facility-administered medications for this visit.    Facility-Administered Medications  Ordered in Other Visits  Medication Dose Route Frequency Provider Last Rate Last Dose  . heparin lock flush 100 unit/mL  500 Units Intravenous Once Livesay, Lennis P, MD      . sodium chloride flush (NS) 0.9 % injection 10 mL  10 mL Intravenous PRN Livesay, Lennis P, MD      . sodium chloride flush (NS) 0.9 % injection 10 mL  10 mL Intracatheter PRN Alvy Bimler, Avner Stroder, MD   10 mL at 03/29/17 1658    PHYSICAL EXAMINATION: ECOG PERFORMANCE STATUS: 1 - Symptomatic but completely ambulatory  Vitals:   07/04/17 1236  BP: 117/71  Pulse: 84  Resp: 18  Temp: 98.5 F (36.9 C)  SpO2: 100%   Filed Weights   07/04/17 1236  Weight: 169 lb 3.2 oz (76.7 kg)    GENERAL:alert, no distress and comfortable SKIN: skin color, texture, turgor are normal, no rashes or significant lesions EYES: normal, Conjunctiva are pink and non-injected, sclera clear OROPHARYNX:no exudate, no erythema and lips, buccal mucosa, and tongue normal  NECK: supple, thyroid normal size, non-tender, without nodularity LYMPH:  no palpable lymphadenopathy in the cervical, axillary or inguinal LUNGS: Mild bilateral wheezes HEART: regular rate & rhythm and no murmurs and no lower extremity edema ABDOMEN:abdomen soft, non-tender and normal bowel sounds Musculoskeletal:no cyanosis of digits and no clubbing.  She has bilateral flank pain on palpation  NEURO: alert & oriented x 3 with fluent speech, no focal motor/sensory deficits  LABORATORY DATA:  I have reviewed the data as listed    Component Value Date/Time   NA 138 07/04/2017 1150   NA 142 12/12/2016 0745   K 4.2 07/04/2017 1150   K 4.5 12/12/2016 0745   CL 108 07/04/2017 1150   CO2 23 07/04/2017 1150   CO2 24 12/12/2016 0745   GLUCOSE 99 07/04/2017 1150   GLUCOSE 93 12/12/2016 0745   BUN 23 07/04/2017 1150   BUN 13.8 12/12/2016 0745   CREATININE 2.22 (H) 07/04/2017 1150   CREATININE 1.1 12/12/2016 0745   CALCIUM 9.6 07/04/2017 1150   CALCIUM 9.3 12/12/2016 0745    PROT 7.3 07/04/2017 1150   PROT 6.9 12/12/2016 0745   ALBUMIN 3.8 07/04/2017 1150   ALBUMIN 3.6 12/12/2016 0745   AST 17 07/04/2017 1150   AST 15 12/12/2016 0745   ALT 11 07/04/2017 1150   ALT 10 12/12/2016 0745   ALKPHOS 118 07/04/2017 1150   ALKPHOS 97 12/12/2016 0745   BILITOT 0.3 07/04/2017 1150   BILITOT 0.34 12/12/2016 0745   GFRNONAA 21 (L) 07/04/2017 1150   GFRAA 24 (L) 07/04/2017 1150    No results found for: SPEP, UPEP  Lab Results  Component Value Date   WBC 4.5 07/04/2017   NEUTROABS 2.8  07/04/2017   HGB 9.8 (L) 07/04/2017   HCT 29.7 (L) 07/04/2017   MCV 92.0 07/04/2017   PLT 190 07/04/2017      Chemistry      Component Value Date/Time   NA 138 07/04/2017 1150   NA 142 12/12/2016 0745   K 4.2 07/04/2017 1150   K 4.5 12/12/2016 0745   CL 108 07/04/2017 1150   CO2 23 07/04/2017 1150   CO2 24 12/12/2016 0745   BUN 23 07/04/2017 1150   BUN 13.8 12/12/2016 0745   CREATININE 2.22 (H) 07/04/2017 1150   CREATININE 1.1 12/12/2016 0745      Component Value Date/Time   CALCIUM 9.6 07/04/2017 1150   CALCIUM 9.3 12/12/2016 0745   ALKPHOS 118 07/04/2017 1150   ALKPHOS 97 12/12/2016 0745   AST 17 07/04/2017 1150   AST 15 12/12/2016 0745   ALT 11 07/04/2017 1150   ALT 10 12/12/2016 0745   BILITOT 0.3 07/04/2017 1150   BILITOT 0.34 12/12/2016 0745     All questions were answered. The patient knows to call the clinic with any problems, questions or concerns. No barriers to learning was detected.  I spent 30 minutes counseling the patient face to face. The total time spent in the appointment was 40 minutes and more than 50% was on counseling and review of test results  Heath Lark, MD 07/04/2017 12:55 PM

## 2017-07-04 NOTE — Assessment & Plan Note (Signed)
She has exacerbation on acute on chronic renal failure, could be due to cancer progression versus dehydration I encouraged her to increase oral fluid as tolerated I will order CT scan with IV contrast

## 2017-07-04 NOTE — Progress Notes (Signed)
Port flushed several times and repositioned with no blood return. Labs are being drawn peripherally by Lab 1. Dr. Alvy Bimler desk nurse Elgie Congo) called and CATHFLOW will be administered. Anairis Knick LPN

## 2017-07-04 NOTE — Telephone Encounter (Signed)
Gave patient AVs and calendar of upcoming may appointments.  °

## 2017-07-04 NOTE — Assessment & Plan Note (Signed)
She has severe bilateral flank pain, worrisome for cancer associated pain I recommend avoiding NSAID due to renal failure Tylenol is not helping I recommend low-dose Dilaudid as needed I warned her about risk of nausea and constipation I will reassess pain control next week

## 2017-07-04 NOTE — Assessment & Plan Note (Signed)
The patient complained of severe bilateral flank pain despite trial of antibiotic treatment It is very suspicious for cancer progression causing possibility of hydronephrosis given recent abnormal kidney function I recommend stopping chemotherapy today and reevaluate with CT scan of the chest, abdomen and pelvis for assessment She agree with the plan of care Plan to see her back next week to review test results

## 2017-07-04 NOTE — Assessment & Plan Note (Signed)
She has mild bilateral wheezes but denies cough, chest pain or shortness of breath I recommend CT imaging to exclude cancer progression in the lungs

## 2017-07-04 NOTE — Telephone Encounter (Signed)
Scheduled pt's CT chest by next Thursday per Dr Alvy Bimler.  Called Scheduling and they have appt for Tuesday 07/09/2017 at 9 am.  Contacted pt and she has her oral contrast.  Gave her instructions for oral contrast- voiced understanding.  Pt would like to know since her CT is on Tues 5/7, Does Dr Alvy Bimler have any earlier office appts then next Friday for her results due to Pt may want to go out of town on Friday.  I let her know I would ask Dr Alvy Bimler.

## 2017-07-05 ENCOUNTER — Telehealth: Payer: Self-pay | Admitting: Hematology and Oncology

## 2017-07-05 ENCOUNTER — Telehealth: Payer: Self-pay

## 2017-07-05 NOTE — Telephone Encounter (Signed)
Scheduled appt per 5/3 sch message - pt is aware of appt date and time   

## 2017-07-05 NOTE — Telephone Encounter (Signed)
I can see her at 43 am on Wed 5/8 If that is acceptable, pls send scheduling msg for 30 mins appt

## 2017-07-05 NOTE — Telephone Encounter (Signed)
Pt wanted to change her appt from 5/10 to earlier in week. Per Dr Alvy Bimler:  I can see her at 49 am on Wed 5/8 If that is acceptable, pls send scheduling msg for 30 mins appt   I called pt and she is agreeable to Wed 5/8 at 11 am  instead of Friday.  Reminded her of CT scheduled for Tues May 7th. Voiced understanding of CT prep instructions as well.

## 2017-07-06 ENCOUNTER — Inpatient Hospital Stay: Payer: Medicare HMO

## 2017-07-09 ENCOUNTER — Encounter (HOSPITAL_COMMUNITY): Payer: Self-pay

## 2017-07-09 ENCOUNTER — Ambulatory Visit (HOSPITAL_COMMUNITY)
Admission: RE | Admit: 2017-07-09 | Discharge: 2017-07-09 | Disposition: A | Discharge status: Home / Self Care | Payer: Medicare HMO | Source: Ambulatory Visit | Attending: Hematology and Oncology | Admitting: Hematology and Oncology

## 2017-07-09 DIAGNOSIS — S32059A Unspecified fracture of fifth lumbar vertebra, initial encounter for closed fracture: Secondary | ICD-10-CM | POA: Diagnosis not present

## 2017-07-09 DIAGNOSIS — C760 Malignant neoplasm of head, face and neck: Secondary | ICD-10-CM | POA: Diagnosis not present

## 2017-07-09 DIAGNOSIS — C7802 Secondary malignant neoplasm of left lung: Secondary | ICD-10-CM | POA: Insufficient documentation

## 2017-07-09 DIAGNOSIS — S22089A Unspecified fracture of T11-T12 vertebra, initial encounter for closed fracture: Secondary | ICD-10-CM | POA: Insufficient documentation

## 2017-07-09 DIAGNOSIS — C539 Malignant neoplasm of cervix uteri, unspecified: Secondary | ICD-10-CM | POA: Diagnosis not present

## 2017-07-09 DIAGNOSIS — X58XXXA Exposure to other specified factors, initial encounter: Secondary | ICD-10-CM | POA: Diagnosis not present

## 2017-07-09 DIAGNOSIS — C787 Secondary malignant neoplasm of liver and intrahepatic bile duct: Secondary | ICD-10-CM | POA: Diagnosis not present

## 2017-07-09 DIAGNOSIS — C7800 Secondary malignant neoplasm of unspecified lung: Secondary | ICD-10-CM | POA: Diagnosis not present

## 2017-07-09 DIAGNOSIS — C7801 Secondary malignant neoplasm of right lung: Secondary | ICD-10-CM | POA: Diagnosis not present

## 2017-07-10 ENCOUNTER — Inpatient Hospital Stay (HOSPITAL_BASED_OUTPATIENT_CLINIC_OR_DEPARTMENT_OTHER): Payer: Medicare HMO | Admitting: Hematology and Oncology

## 2017-07-10 DIAGNOSIS — C787 Secondary malignant neoplasm of liver and intrahepatic bile duct: Secondary | ICD-10-CM

## 2017-07-10 DIAGNOSIS — Z9221 Personal history of antineoplastic chemotherapy: Secondary | ICD-10-CM | POA: Diagnosis not present

## 2017-07-10 DIAGNOSIS — C7802 Secondary malignant neoplasm of left lung: Secondary | ICD-10-CM | POA: Diagnosis not present

## 2017-07-10 DIAGNOSIS — Z933 Colostomy status: Secondary | ICD-10-CM | POA: Diagnosis not present

## 2017-07-10 DIAGNOSIS — C539 Malignant neoplasm of cervix uteri, unspecified: Secondary | ICD-10-CM | POA: Diagnosis not present

## 2017-07-10 DIAGNOSIS — N183 Chronic kidney disease, stage 3 unspecified: Secondary | ICD-10-CM

## 2017-07-10 DIAGNOSIS — C7801 Secondary malignant neoplasm of right lung: Secondary | ICD-10-CM | POA: Diagnosis not present

## 2017-07-10 DIAGNOSIS — Z79899 Other long term (current) drug therapy: Secondary | ICD-10-CM

## 2017-07-10 DIAGNOSIS — G893 Neoplasm related pain (acute) (chronic): Secondary | ICD-10-CM | POA: Diagnosis not present

## 2017-07-10 DIAGNOSIS — N184 Chronic kidney disease, stage 4 (severe): Secondary | ICD-10-CM | POA: Diagnosis not present

## 2017-07-10 DIAGNOSIS — D61818 Other pancytopenia: Secondary | ICD-10-CM

## 2017-07-10 DIAGNOSIS — Z7189 Other specified counseling: Secondary | ICD-10-CM

## 2017-07-11 ENCOUNTER — Telehealth: Payer: Self-pay | Admitting: Hematology and Oncology

## 2017-07-11 ENCOUNTER — Encounter: Payer: Self-pay | Admitting: Hematology and Oncology

## 2017-07-11 NOTE — Assessment & Plan Note (Signed)
I encouraged her to increase oral fluid as tolerated

## 2017-07-11 NOTE — Progress Notes (Signed)
[facility] OFFICE PROGRESS NOTE  Patient Care Team: [name], MD as PCP - General (Family Medicine) Carmel Sacramento, OD as Consulting Physician (Optometry)  ASSESSMENT & PLAN:  Cervical cancer [facility]) We have reviewed multiple imaging studies She has disease progression The patient is frail and has poor tolerance to chemotherapy in the past We reviewed the current guidelines In the past, she did not progress on carboplatin or Taxol I do not recommend bevacizumab due to history of bowel obstruction and extensive bowel surgery We discussed prognosis with or without treatment The patient would like to go home and think about it and will call me with final decision  Malignant neoplasm metastatic to left lung [facility]) She has diffuse disease progression in both lungs but she is not symptomatic Recommend observation only for now  Chronic kidney disease, stage III (moderate) I encouraged her to increase oral fluid as tolerated  Cancer associated pain Recent back pain is likely due to bone fracture seen on imaging We discussed further management but for now she is in favor of just taking analgesics as needed  Goals of care, counseling/discussion We had multiple, extensive goals of care discussion in the past She is frail and tolerated treatment very poorly with multiple complications Since we stopped her chemotherapy, she is enjoying good quality of life We discussed extensively about potential stopping treatment and be enrolled in palliative care/hospice program The patient is undecided She has verbalized her desire to be DNR in the future but we do not have any formal advanced directive or living will We discussed prognosis Without chemotherapy, her average prognosis is likely to be under 6 months   No orders of the defined types were placed in this encounter.   INTERVAL HISTORY: Please see below for problem oriented charting. She returns to review test  results Her back pain is stable with recent prescription hydromorphone as needed She denies cough, chest pain or shortness of breath Her appetite is stable  SUMMARY OF ONCOLOGIC HISTORY: Oncology History   PD-L 1 testing neg     Cervical cancer ([place])   04/26/2015 Imaging    Numerous bilateral pulmonary nodules consistent with metastatic disease are associated with liver lesions, omental nodules, mesenteric nodules, necrotic retroperitoneal lymphadenopathy in a mixed cystic and solid mass in the central pelvis involving the uterus extending into the cul-de-sac and both adnexal regions. Uterine primary (likely endometrial) is favored over an ovarian primary malignancy given the lack of ascites, associated intrahepatic metastases and pulmonary involvement.      05/04/2015 Procedure    Technically successful CT-guided core biopsy of left pelvic mass      05/04/2015 Pathology Results    Soft Tissue Needle Core Biopsy, left ext iliac mass METASTATIC HIGH GRADE POORLY DIFFERENTIATED CARCINOMA CONSISTENT WITH CERVICAL ORIGIN Microscopic Comment The neoplasm stains positive for cervical markers : p16, ER, ck8/18, and p63 (focal), TTF-1 (focal) and negative for synaptophysin, Chromogranin (neuroendocrine markers) and ck5/6. The morphology and immunohistochemistry staining pattern support these cells are GYN cervical origin.Spec      05/10/2015 Imaging    Innumerable (> than 40) pulmonary nodules randomly distributed throughout both lungs, largest 1.1 cm, in keeping with pulmonary metastases. 2. Left supraclavicular and left hilar nodal metastases. 3. Re- demonstration of liver metastases. 4. Right thyroid lobe 1.4 cm pulmonary nodule, for which no further evaluation is recommended. 5. Stable mild superior T11 vertebral compression deformity.      05/12/2015 - 05/12/2015 Chemotherapy    She received 1  dose of carbo/taxol only      05/15/2015 Imaging    Widely metastatic cervical carcinoma with unchanged  appearance compared to 04/26/2015. Bulky tumor narrows and likely invades the rectum/distal sigmoid with progressive proximal stool retention      05/15/2015 - 05/23/2015 Hospital Admission    She was admitted for severe bowel obstruction requiring urgent diversion colostomy      05/18/2015 Surgery    She underwent LAPAROSCOPIC DIVERTING SIGMOID LOOP COLOSTOMY for bowel obstruction       06/10/2015 Procedure    Successful placement of a right internal jugular approach power injectable Port-A-Cath. The catheter is ready for immediate use      06/23/2015 - 03/01/2016 Chemotherapy    She received combination treatment with cisplatin, Taxol and Avastin. Avastin was started on 5/11. Dose of Taxol was modified due to neuropathy and cisplatin was omited last dose due to 'weakness'      09/13/2015 Imaging    Significant interval treatment response. Pulmonary and liver metastases have significantly decreased in size. Thoracic adenopathy has decreased in size. Mixed attenuation mass encompassing the uterus and bilateral adnexal regions has significantly decreased in size. No residual visualized peritoneal tumor implants. No new or progressive metastatic disease. 2. No evidence of bowel obstruction or acute bowel inflammation status post diverting sigmoid colostomy. 3. Additional findings include mild aortic atherosclerosis and Stable mild chronic superior T12 compression fracture.      12/14/2015 Imaging    Stable to improved interval exam. Multiple bilateral pulmonary nodules are stable in size to decreased and the liver lesions are stable to decreased. Mixed attenuation lesion encompassing the uterus and adnexal region shows decrease in size. There is no evidence for new or progressive disease on today's exam.      05/15/2016 Imaging    Findings today reflecting mixed interval response to therapy. 2. There has been mild increase in size of pulmonary nodules. 3. Interval increase in size of pelvic mass  4. Further regression of liver metastases 5. There is a new soft tissue nodule within the right lower quadrant peritoneal cavity. Suspicious peritoneal disease      06/07/2016 - 06/06/2017 Chemotherapy    She received chemo with cisplatin and gemzar      06/14/2016 Adverse Reaction    Cycle 1, day 8 is put on hold due to severe pancytopenia      08/29/2016 Imaging    Ct scan of chest, abdomen and pelvis: 1. General improvement, with reduced size of the scattered pulmonary nodules, and also a much more normal contour and appearance of the uterus, without the visible enhancing irregular mass shown on the 05/15/2016 exam involving the uterus and cervix. There still some mild soft tissue prominence of the cervix, and a cystic lesion of the left ovary as noted above. No pathologic adenopathy. 2. The right lower quadrant omental nodule has considerably improved and nearly resolved. 3. The prior hepatic metastatic lesions have essentially resolved, with only minimal residual linear hypodensity in segment 4 at the site of the prior large metastatic lesion shown on 05/15/2015.  4. Other imaging findings of potential clinical significance: Left lower quadrant colostomy. Chronic superior endplate compression at T11. Bony demineralization. Trace free pelvic fluid in the cul-de-sac. Central disc protrusion at L3-4. Left mid kidney hypodense lesion is likely a cyst.      12/12/2016 Imaging    1. Suspected minimal enlargement of at least 1 pulmonary nodule. The majority of pulmonary nodules are similar. Cannot exclude minimal pulmonary metastasis  progression. 2. No evidence of extrathoracic progressive metastatic disease. Similar soft tissue fullness within the uterine cervix and similar to slight decrease in size of a tiny omental nodule. 3. Aortic Atherosclerosis (ICD10-I70.0). 4. Chronic T10 compression deformity      03/13/2017 Imaging    1. Interval progression of pre-existing pulmonary metastases with  interval development of new pulmonary metastatic lesions. 2. Interval development of new and progression of pre-existing omental metastases. 3. Marked interval progression of cervical disease with dilated fluid-filled endometrial canal in the uterine fundus. The cervical disease infiltrates into the lower uterine segment and likely extends into the adnexal space bilaterally. Posterior extent of cervical disease is tethered to the distal sigmoid colon in this patient with a sigmoid loop colostomy. 4. Stable atrophy of the medial segment left liver.      03/22/2017 - 06/06/2017 Chemotherapy    The patient had chemo cisplatin and gemzar       04/26/2017 Imaging    1. Bladder wall irregularity anteriorly, suspicious for cystitis. No specific evidence of fistula to uterus or vagina. Voiding cystogram would likely be of greatest sensitivity. 2. Similar to slight decrease in size of infiltrative mass centered about the lower uterine segment. Presumed sigmoid colonic wall involvement.      07/09/2017 Imaging    Mild interval progression of diffuse bilateral pulmonary metastases since prior study.  Stable soft tissue mass involving the cervix, with hydrometros. Stable bilateral adnexal soft tissue densities likely due to metastatic disease. No new or progressive metastatic disease identified within the abdomen or pelvis.  Mild benign-appearing compression fractures of T12 and L5 vertebral bodies, new since 03/13/2017 exam.       REVIEW OF SYSTEMS:   Constitutional: Denies fevers, chills or abnormal weight loss Eyes: Denies blurriness of vision Ears, nose, mouth, throat, and face: Denies mucositis or sore throat Respiratory: Denies cough, dyspnea or wheezes Cardiovascular: Denies palpitation, chest discomfort or lower extremity swelling Gastrointestinal:  Denies nausea, heartburn or change in bowel habits Skin: Denies abnormal skin rashes Lymphatics: Denies new lymphadenopathy or easy  bruising Neurological:Denies numbness, tingling or new weaknesses Behavioral/Psych: Mood is stable, no new changes  All other systems were reviewed with the patient and are negative.  I have reviewed the past medical history, past surgical history, social history and family history with the patient and they are unchanged from previous note.  ALLERGIES:  has No Known Allergies.  MEDICATIONS:  Current Outpatient Medications  Medication Sig Dispense Refill  . cholecalciferol (VITAMIN D) 1000 units tablet Take 1,000 Units by mouth daily.    Marland Kitchen HYDROmorphone (DILAUDID) 4 MG tablet Take 1 tablet (4 mg total) by mouth every 4 (four) hours as needed for severe pain. 30 tablet 0  . lidocaine-prilocaine (EMLA) cream Apply 1 application topically as needed. 30 g 6  . methocarbamol (ROBAXIN) 500 MG tablet Take 1 tablet (500 mg total) by mouth every 8 (eight) hours as needed for muscle spasms. 30 tablet 0  . omeprazole (PRILOSEC) 20 MG capsule TAKE 1 CAPSULE BY MOUTH EVERY DAY 90 capsule 0  . ondansetron (ZOFRAN) 8 MG tablet Take 1 tablet (8 mg total) by mouth every 8 (eight) hours as needed for nausea. 30 tablet 3  . oxybutynin (DITROPAN-XL) 5 MG 24 hr tablet TAKE 1 TABLET BY MOUTH EVERYDAY AT BEDTIME 30 tablet 3  . prochlorperazine (COMPAZINE) 10 MG tablet Take 1 tablet (10 mg total) by mouth every 6 (six) hours as needed for nausea or vomiting. 30 tablet 1  .  temazepam (RESTORIL) 15 MG capsule Take 15 mg by mouth at bedtime as needed for sleep.     No current facility-administered medications for this visit.    Facility-Administered Medications Ordered in Other Visits  Medication Dose Route Frequency Provider Last Rate Last Dose  . heparin lock flush 100 unit/mL  500 Units Intravenous Once Livesay, Lennis P, MD      . sodium chloride flush (NS) 0.9 % injection 10 mL  10 mL Intravenous PRN Livesay, Lennis P, MD      . sodium chloride flush (NS) 0.9 % injection 10 mL  10 mL Intracatheter PRN Alvy Bimler,  Paradise Vensel, MD   10 mL at 03/29/17 1658    PHYSICAL EXAMINATION: ECOG PERFORMANCE STATUS: 1 - Symptomatic but completely ambulatory  Vitals:   07/10/17 1104  BP: 112/74  Pulse: 91  Resp: 18  Temp: 99 F (37.2 C)  SpO2: 98%   Filed Weights   07/10/17 1104  Weight: 167 lb 12.8 oz (76.1 kg)    GENERAL:alert, no distress and comfortable.  She is wearing a back brace SKIN: skin color, texture, turgor are normal, no rashes or significant lesions Musculoskeletal:no cyanosis of digits and no clubbing  NEURO: alert & oriented x 3 with fluent speech, no focal motor/sensory deficits  LABORATORY DATA:  I have reviewed the data as listed    Component Value Date/Time   NA 138 07/04/2017 1150   NA 142 12/12/2016 0745   K 4.2 07/04/2017 1150   K 4.5 12/12/2016 0745   CL 108 07/04/2017 1150   CO2 23 07/04/2017 1150   CO2 24 12/12/2016 0745   GLUCOSE 99 07/04/2017 1150   GLUCOSE 93 12/12/2016 0745   BUN 23 07/04/2017 1150   BUN 13.8 12/12/2016 0745   CREATININE 2.22 (H) 07/04/2017 1150   CREATININE 1.1 12/12/2016 0745   CALCIUM 9.6 07/04/2017 1150   CALCIUM 9.3 12/12/2016 0745   PROT 7.3 07/04/2017 1150   PROT 6.9 12/12/2016 0745   ALBUMIN 3.8 07/04/2017 1150   ALBUMIN 3.6 12/12/2016 0745   AST 17 07/04/2017 1150   AST 15 12/12/2016 0745   ALT 11 07/04/2017 1150   ALT 10 12/12/2016 0745   ALKPHOS 118 07/04/2017 1150   ALKPHOS 97 12/12/2016 0745   BILITOT 0.3 07/04/2017 1150   BILITOT 0.34 12/12/2016 0745   GFRNONAA 21 (L) 07/04/2017 1150   GFRAA 24 (L) 07/04/2017 1150    No results found for: SPEP, UPEP  Lab Results  Component Value Date   WBC 4.5 07/04/2017   NEUTROABS 2.8 07/04/2017   HGB 9.8 (L) 07/04/2017   HCT 29.7 (L) 07/04/2017   MCV 92.0 07/04/2017   PLT 190 07/04/2017      Chemistry      Component Value Date/Time   NA 138 07/04/2017 1150   NA 142 12/12/2016 0745   K 4.2 07/04/2017 1150   K 4.5 12/12/2016 0745   CL 108 07/04/2017 1150   CO2 23 07/04/2017  1150   CO2 24 12/12/2016 0745   BUN 23 07/04/2017 1150   BUN 13.8 12/12/2016 0745   CREATININE 2.22 (H) 07/04/2017 1150   CREATININE 1.1 12/12/2016 0745      Component Value Date/Time   CALCIUM 9.6 07/04/2017 1150   CALCIUM 9.3 12/12/2016 0745   ALKPHOS 118 07/04/2017 1150   ALKPHOS 97 12/12/2016 0745   AST 17 07/04/2017 1150   AST 15 12/12/2016 0745   ALT 11 07/04/2017 1150   ALT 10 12/12/2016 0745  BILITOT 0.3 07/04/2017 1150   BILITOT 0.34 12/12/2016 0745       RADIOGRAPHIC STUDIES: I have reviewed imaging study with the patient I have personally reviewed the radiological images as listed and agreed with the findings in the report. Ct Abdomen Pelvis Wo Contrast  Result Date: 07/09/2017 CLINICAL DATA:  Followup metastatic cervical carcinoma to lung and liver. Ongoing chemotherapy. Restaging. EXAM: CT CHEST, ABDOMEN AND PELVIS WITHOUT CONTRAST TECHNIQUE: Multidetector CT imaging of the chest, abdomen and pelvis was performed following the standard protocol without IV contrast. COMPARISON:  03/13/2017 FINDINGS: CT CHEST FINDINGS Cardiovascular: No acute findings. Mediastinum/Lymph Nodes: No masses or pathologically enlarged lymph nodes identified on this unenhanced exam. Lungs/Pleura: Small diffuse bilateral pulmonary metastases show interval increase in number and size since previous study. No evidence of pulmonary infiltrate or pleural effusion. Musculoskeletal:  No suspicious bone lesions identified. CT ABDOMEN AND PELVIS FINDINGS Hepatobiliary: No masses visualized on this unenhanced exam. Gallbladder is unremarkable. Pancreas: No mass or inflammatory changes identified on this unenhanced exam. Spleen:  Within normal limits in size. Adrenals/Urinary Tract: No evidence of urolithiasis or hydronephrosis. Unremarkable appearance of bladder. Stomach/Bowel: Loop colostomy is again seen in the left lower quadrant. No evidence of bowel obstruction, inflammatory process, or abnormal fluid  collections. Vascular/Lymphatic: No pathologically enlarged lymph nodes identified. No abdominal aortic aneurysm. Reproductive: Soft tissue mass in the area of the cervix measures 5.3 x 4.3 cm on image 110/2, without significant change. Hydrometros is again demonstrated. Ill-defined soft tissue density is also seen in both adnexal regions, measuring 5.5 x 3.2 cm on the right and 3.7 x 2.9 cm on the left. This is also stable since previous study. No new or enlarging soft tissue masses are identified. Other:  None. Musculoskeletal: No suspicious bone lesions identified. Mild compression fractures of the T12 and L5 vertebral bodies are new since 03/13/2017 exam. T11 vertebral body compression fracture is unchanged. IMPRESSION: Mild interval progression of diffuse bilateral pulmonary metastases since prior study. Stable soft tissue mass involving the cervix, with hydrometros. Stable bilateral adnexal soft tissue densities likely due to metastatic disease. No new or progressive metastatic disease identified within the abdomen or pelvis. Mild benign-appearing compression fractures of T12 and L5 vertebral bodies, new since 03/13/2017 exam. Electronically Signed   By: Earle Gell M.D.   On: 07/09/2017 14:10   Ct Chest Wo Contrast  Result Date: 07/09/2017 CLINICAL DATA:  Followup metastatic cervical carcinoma to lung and liver. Ongoing chemotherapy. Restaging. EXAM: CT CHEST, ABDOMEN AND PELVIS WITHOUT CONTRAST TECHNIQUE: Multidetector CT imaging of the chest, abdomen and pelvis was performed following the standard protocol without IV contrast. COMPARISON:  03/13/2017 FINDINGS: CT CHEST FINDINGS Cardiovascular: No acute findings. Mediastinum/Lymph Nodes: No masses or pathologically enlarged lymph nodes identified on this unenhanced exam. Lungs/Pleura: Small diffuse bilateral pulmonary metastases show interval increase in number and size since previous study. No evidence of pulmonary infiltrate or pleural effusion.  Musculoskeletal:  No suspicious bone lesions identified. CT ABDOMEN AND PELVIS FINDINGS Hepatobiliary: No masses visualized on this unenhanced exam. Gallbladder is unremarkable. Pancreas: No mass or inflammatory changes identified on this unenhanced exam. Spleen:  Within normal limits in size. Adrenals/Urinary Tract: No evidence of urolithiasis or hydronephrosis. Unremarkable appearance of bladder. Stomach/Bowel: Loop colostomy is again seen in the left lower quadrant. No evidence of bowel obstruction, inflammatory process, or abnormal fluid collections. Vascular/Lymphatic: No pathologically enlarged lymph nodes identified. No abdominal aortic aneurysm. Reproductive: Soft tissue mass in the area of the cervix measures 5.3 x  4.3 cm on image 110/2, without significant change. Hydrometros is again demonstrated. Ill-defined soft tissue density is also seen in both adnexal regions, measuring 5.5 x 3.2 cm on the right and 3.7 x 2.9 cm on the left. This is also stable since previous study. No new or enlarging soft tissue masses are identified. Other:  None. Musculoskeletal: No suspicious bone lesions identified. Mild compression fractures of the T12 and L5 vertebral bodies are new since 03/13/2017 exam. T11 vertebral body compression fracture is unchanged. IMPRESSION: Mild interval progression of diffuse bilateral pulmonary metastases since prior study. Stable soft tissue mass involving the cervix, with hydrometros. Stable bilateral adnexal soft tissue densities likely due to metastatic disease. No new or progressive metastatic disease identified within the abdomen or pelvis. Mild benign-appearing compression fractures of T12 and L5 vertebral bodies, new since 03/13/2017 exam. Electronically Signed   By: Earle Gell M.D.   On: 07/09/2017 14:10    All questions were answered. The patient knows to call the clinic with any problems, questions or concerns. No barriers to learning was detected.  I spent 30 minutes  counseling the patient face to face. The total time spent in the appointment was 40 minutes and more than 50% was on counseling and review of test results  Heath Lark, MD 07/11/2017 10:41 AM

## 2017-07-11 NOTE — Assessment & Plan Note (Signed)
She has diffuse disease progression in both lungs but she is not symptomatic Recommend observation only for now

## 2017-07-11 NOTE — Assessment & Plan Note (Signed)
We had multiple, extensive goals of care discussion in the past She is frail and tolerated treatment very poorly with multiple complications Since we stopped her chemotherapy, she is enjoying good quality of life We discussed extensively about potential stopping treatment and be enrolled in palliative care/hospice program The patient is undecided She has verbalized her desire to be DNR in the future but we do not have any formal advanced directive or living will We discussed prognosis Without chemotherapy, her average prognosis is likely to be under 6 months

## 2017-07-11 NOTE — Assessment & Plan Note (Signed)
We have reviewed multiple imaging studies She has disease progression The patient is frail and has poor tolerance to chemotherapy in the past We reviewed the current guidelines In the past, she did not progress on carboplatin or Taxol I do not recommend bevacizumab due to history of bowel obstruction and extensive bowel surgery We discussed prognosis with or without treatment The patient would like to go home and think about it and will call me with final decision

## 2017-07-11 NOTE — Assessment & Plan Note (Signed)
Recent back pain is likely due to bone fracture seen on imaging We discussed further management but for now she is in favor of just taking analgesics as needed

## 2017-07-11 NOTE — Telephone Encounter (Signed)
Per 5/8 no new orders

## 2017-07-12 ENCOUNTER — Ambulatory Visit: Payer: Medicare HMO | Admitting: Hematology and Oncology

## 2017-07-16 ENCOUNTER — Inpatient Hospital Stay (HOSPITAL_BASED_OUTPATIENT_CLINIC_OR_DEPARTMENT_OTHER): Payer: Medicare HMO | Admitting: Hematology and Oncology

## 2017-07-16 ENCOUNTER — Telehealth: Payer: Self-pay | Admitting: *Deleted

## 2017-07-16 ENCOUNTER — Encounter: Payer: Self-pay | Admitting: Hematology and Oncology

## 2017-07-16 DIAGNOSIS — T451X5A Adverse effect of antineoplastic and immunosuppressive drugs, initial encounter: Secondary | ICD-10-CM

## 2017-07-16 DIAGNOSIS — C787 Secondary malignant neoplasm of liver and intrahepatic bile duct: Secondary | ICD-10-CM

## 2017-07-16 DIAGNOSIS — Z79899 Other long term (current) drug therapy: Secondary | ICD-10-CM

## 2017-07-16 DIAGNOSIS — C7802 Secondary malignant neoplasm of left lung: Secondary | ICD-10-CM | POA: Diagnosis not present

## 2017-07-16 DIAGNOSIS — C539 Malignant neoplasm of cervix uteri, unspecified: Secondary | ICD-10-CM | POA: Diagnosis not present

## 2017-07-16 DIAGNOSIS — N184 Chronic kidney disease, stage 4 (severe): Secondary | ICD-10-CM | POA: Diagnosis not present

## 2017-07-16 DIAGNOSIS — G893 Neoplasm related pain (acute) (chronic): Secondary | ICD-10-CM

## 2017-07-16 DIAGNOSIS — D61818 Other pancytopenia: Secondary | ICD-10-CM | POA: Diagnosis not present

## 2017-07-16 DIAGNOSIS — Z933 Colostomy status: Secondary | ICD-10-CM

## 2017-07-16 DIAGNOSIS — Z9221 Personal history of antineoplastic chemotherapy: Secondary | ICD-10-CM

## 2017-07-16 DIAGNOSIS — Z66 Do not resuscitate: Secondary | ICD-10-CM

## 2017-07-16 DIAGNOSIS — Z7189 Other specified counseling: Secondary | ICD-10-CM

## 2017-07-16 DIAGNOSIS — C7801 Secondary malignant neoplasm of right lung: Secondary | ICD-10-CM | POA: Diagnosis not present

## 2017-07-16 DIAGNOSIS — G620 Drug-induced polyneuropathy: Secondary | ICD-10-CM

## 2017-07-16 NOTE — Progress Notes (Signed)
[place] OFFICE PROGRESS NOTE  Patient Care Team: [name], MD as PCP - General (Family Medicine) Carmel Sacramento, OD as Consulting Physician (Optometry)  ASSESSMENT & PLAN:  Cervical cancer [facility]) We had extensive discussion about goals of care and treatment options The patient would like treatment but is not willing to endure potential risks of side effects Previously, she had recurrent history of infection, pancytopenia, nausea and peripheral neuropathy from treatment I do not believe she can tolerate combination chemotherapy We have discussed the potential risks, benefits, side effects of carboplatin versus Taxol, including risk of pancytopenia, hair loss, neuropathy and others and ultimately she is undecided I will call her next week for further assessment of plan of care   Malignant neoplasm metastatic to left lung [facility]) She has diffuse disease progression in both lungs but she is not symptomatic Recommend observation only for now  Chronic kidney disease (CKD), stage IV (severe) ([place]) I will have to dose reduce her future chemotherapy due to worsening renal function  Chemotherapy-induced peripheral neuropathy ([place]) I am concerned about worsening neuropathy with future chemotherapy She would have to be dose reduced for future treatment  Goals of care, counseling/discussion We had multiple, extensive goals of care discussion in the past She is frail and tolerated treatment very poorly with multiple complications Since we stopped her chemotherapy, she is enjoying good quality of life We discussed extensively about potential stopping treatment and be enrolled in palliative care/hospice program The patient is undecided She has verbalized her desire to be DNR in the future but we do not have any formal advanced directive or living will Ultimately, we plan to revisit the issues about goals of care in our next visit/phone call discussion next week   No  orders of the defined types were placed in this encounter.   INTERVAL HISTORY: Please see below for problem oriented charting. She returns to discuss chemotherapy plan She desired to resume treatment next week but is undecided about the choice of chemotherapy She has good quality of life Her back pain is stable She has mild residual peripheral neuropathy She is concerned about potential side effects from treatment Currently, she denies chest pain or shortness of breath She denies nausea, changes in bowel habits or vaginal bleeding  SUMMARY OF ONCOLOGIC HISTORY: Oncology History   PD-L 1 testing neg     Cervical cancer ([place])   04/26/2015 Imaging    Numerous bilateral pulmonary nodules consistent with metastatic disease are associated with liver lesions, omental nodules, mesenteric nodules, necrotic retroperitoneal lymphadenopathy in a mixed cystic and solid mass in the central pelvis involving the uterus extending into the cul-de-sac and both adnexal regions. Uterine primary (likely endometrial) is favored over an ovarian primary malignancy given the lack of ascites, associated intrahepatic metastases and pulmonary involvement.      05/04/2015 Procedure    Technically successful CT-guided core biopsy of left pelvic mass      05/04/2015 Pathology Results    Soft Tissue Needle Core Biopsy, left ext iliac mass METASTATIC HIGH GRADE POORLY DIFFERENTIATED CARCINOMA CONSISTENT WITH CERVICAL ORIGIN Microscopic Comment The neoplasm stains positive for cervical markers : p16, ER, ck8/18, and p63 (focal), TTF-1 (focal) and negative for synaptophysin, Chromogranin (neuroendocrine markers) and ck5/6. The morphology and immunohistochemistry staining pattern support these cells are GYN cervical origin.Spec      05/10/2015 Imaging    Innumerable (> than 40) pulmonary nodules randomly distributed throughout both lungs, largest 1.1 cm, in keeping with pulmonary metastases. 2. Left  supraclavicular and  left hilar nodal metastases. 3. Re- demonstration of liver metastases. 4. Right thyroid lobe 1.4 cm pulmonary nodule, for which no further evaluation is recommended. 5. Stable mild superior T11 vertebral compression deformity.      05/12/2015 - 05/12/2015 Chemotherapy    She received 1 dose of carbo/taxol only      05/15/2015 Imaging    Widely metastatic cervical carcinoma with unchanged appearance compared to 04/26/2015. Bulky tumor narrows and likely invades the rectum/distal sigmoid with progressive proximal stool retention      05/15/2015 - 05/23/2015 Hospital Admission    She was admitted for severe bowel obstruction requiring urgent diversion colostomy      05/18/2015 Surgery    She underwent LAPAROSCOPIC DIVERTING SIGMOID LOOP COLOSTOMY for bowel obstruction       06/10/2015 Procedure    Successful placement of a right internal jugular approach power injectable Port-A-Cath. The catheter is ready for immediate use      06/23/2015 - 03/01/2016 Chemotherapy    She received combination treatment with cisplatin, Taxol and Avastin. Avastin was started on 5/11. Dose of Taxol was modified due to neuropathy and cisplatin was omited last dose due to 'weakness'      09/13/2015 Imaging    Significant interval treatment response. Pulmonary and liver metastases have significantly decreased in size. Thoracic adenopathy has decreased in size. Mixed attenuation mass encompassing the uterus and bilateral adnexal regions has significantly decreased in size. No residual visualized peritoneal tumor implants. No new or progressive metastatic disease. 2. No evidence of bowel obstruction or acute bowel inflammation status post diverting sigmoid colostomy. 3. Additional findings include mild aortic atherosclerosis and Stable mild chronic superior T12 compression fracture.      12/14/2015 Imaging    Stable to improved interval exam. Multiple bilateral pulmonary nodules are stable in size to decreased and the  liver lesions are stable to decreased. Mixed attenuation lesion encompassing the uterus and adnexal region shows decrease in size. There is no evidence for new or progressive disease on today's exam.      05/15/2016 Imaging    Findings today reflecting mixed interval response to therapy. 2. There has been mild increase in size of pulmonary nodules. 3. Interval increase in size of pelvic mass 4. Further regression of liver metastases 5. There is a new soft tissue nodule within the right lower quadrant peritoneal cavity. Suspicious peritoneal disease      06/07/2016 - 06/06/2017 Chemotherapy    She received chemo with cisplatin and gemzar      06/14/2016 Adverse Reaction    Cycle 1, day 8 is put on hold due to severe pancytopenia      08/29/2016 Imaging    Ct scan of chest, abdomen and pelvis: 1. General improvement, with reduced size of the scattered pulmonary nodules, and also a much more normal contour and appearance of the uterus, without the visible enhancing irregular mass shown on the 05/15/2016 exam involving the uterus and cervix. There still some mild soft tissue prominence of the cervix, and a cystic lesion of the left ovary as noted above. No pathologic adenopathy. 2. The right lower quadrant omental nodule has considerably improved and nearly resolved. 3. The prior hepatic metastatic lesions have essentially resolved, with only minimal residual linear hypodensity in segment 4 at the site of the prior large metastatic lesion shown on 05/15/2015.  4. Other imaging findings of potential clinical significance: Left lower quadrant colostomy. Chronic superior endplate compression at T11. Bony demineralization. Trace free pelvic  fluid in the cul-de-sac. Central disc protrusion at L3-4. Left mid kidney hypodense lesion is likely a cyst.      12/12/2016 Imaging    1. Suspected minimal enlargement of at least 1 pulmonary nodule. The majority of pulmonary nodules are similar. Cannot exclude minimal  pulmonary metastasis progression. 2. No evidence of extrathoracic progressive metastatic disease. Similar soft tissue fullness within the uterine cervix and similar to slight decrease in size of a tiny omental nodule. 3. Aortic Atherosclerosis (ICD10-I70.0). 4. Chronic T10 compression deformity      03/13/2017 Imaging    1. Interval progression of pre-existing pulmonary metastases with interval development of new pulmonary metastatic lesions. 2. Interval development of new and progression of pre-existing omental metastases. 3. Marked interval progression of cervical disease with dilated fluid-filled endometrial canal in the uterine fundus. The cervical disease infiltrates into the lower uterine segment and likely extends into the adnexal space bilaterally. Posterior extent of cervical disease is tethered to the distal sigmoid colon in this patient with a sigmoid loop colostomy. 4. Stable atrophy of the medial segment left liver.      03/22/2017 - 06/06/2017 Chemotherapy    The patient had chemo cisplatin and gemzar       04/26/2017 Imaging    1. Bladder wall irregularity anteriorly, suspicious for cystitis. No specific evidence of fistula to uterus or vagina. Voiding cystogram would likely be of greatest sensitivity. 2. Similar to slight decrease in size of infiltrative mass centered about the lower uterine segment. Presumed sigmoid colonic wall involvement.      07/09/2017 Imaging    Mild interval progression of diffuse bilateral pulmonary metastases since prior study.  Stable soft tissue mass involving the cervix, with hydrometros. Stable bilateral adnexal soft tissue densities likely due to metastatic disease. No new or progressive metastatic disease identified within the abdomen or pelvis.  Mild benign-appearing compression fractures of T12 and L5 vertebral bodies, new since 03/13/2017 exam.       REVIEW OF SYSTEMS:   Constitutional: Denies fevers, chills or abnormal weight  loss Eyes: Denies blurriness of vision Ears, nose, mouth, throat, and face: Denies mucositis or sore throat Respiratory: Denies cough, dyspnea or wheezes Cardiovascular: Denies palpitation, chest discomfort or lower extremity swelling Gastrointestinal:  Denies nausea, heartburn or change in bowel habits Skin: Denies abnormal skin rashes Lymphatics: Denies new lymphadenopathy or easy bruising Neurological:Denies numbness, tingling or new weaknesses Behavioral/Psych: Mood is stable, no new changes  All other systems were reviewed with the patient and are negative.  I have reviewed the past medical history, past surgical history, social history and family history with the patient and they are unchanged from previous note.  ALLERGIES:  has No Known Allergies.  MEDICATIONS:  Current Outpatient Medications  Medication Sig Dispense Refill  . cholecalciferol (VITAMIN D) 1000 units tablet Take 1,000 Units by mouth daily.    Marland Kitchen HYDROmorphone (DILAUDID) 4 MG tablet Take 1 tablet (4 mg total) by mouth every 4 (four) hours as needed for severe pain. 30 tablet 0  . lidocaine-prilocaine (EMLA) cream Apply 1 application topically as needed. 30 g 6  . methocarbamol (ROBAXIN) 500 MG tablet Take 1 tablet (500 mg total) by mouth every 8 (eight) hours as needed for muscle spasms. 30 tablet 0  . omeprazole (PRILOSEC) 20 MG capsule TAKE 1 CAPSULE BY MOUTH EVERY DAY 90 capsule 0  . ondansetron (ZOFRAN) 8 MG tablet Take 1 tablet (8 mg total) by mouth every 8 (eight) hours as needed for nausea. 30 tablet  3  . oxybutynin (DITROPAN-XL) 5 MG 24 hr tablet TAKE 1 TABLET BY MOUTH EVERYDAY AT BEDTIME 30 tablet 3  . prochlorperazine (COMPAZINE) 10 MG tablet Take 1 tablet (10 mg total) by mouth every 6 (six) hours as needed for nausea or vomiting. 30 tablet 1  . temazepam (RESTORIL) 15 MG capsule Take 15 mg by mouth at bedtime as needed for sleep.     No current facility-administered medications for this visit.     Facility-Administered Medications Ordered in Other Visits  Medication Dose Route Frequency Provider Last Rate Last Dose  . heparin lock flush 100 unit/mL  500 Units Intravenous Once Livesay, Lennis P, MD      . sodium chloride flush (NS) 0.9 % injection 10 mL  10 mL Intravenous PRN Livesay, Lennis P, MD      . sodium chloride flush (NS) 0.9 % injection 10 mL  10 mL Intracatheter PRN Alvy Bimler, Carisma Troupe, MD   10 mL at 03/29/17 1658    PHYSICAL EXAMINATION: ECOG PERFORMANCE STATUS: 1 - Symptomatic but completely ambulatory  Vitals:   07/16/17 1406  BP: 117/71  Pulse: 93  Resp: 18  Temp: 98.3 F (36.8 C)  SpO2: 100%   Filed Weights   07/16/17 1406  Weight: 168 lb 9.6 oz (76.5 kg)    GENERAL:alert, no distress and comfortable NEURO: alert & oriented x 3 with fluent speech, no focal motor/sensory deficits  LABORATORY DATA:  I have reviewed the data as listed    Component Value Date/Time   NA 138 07/04/2017 1150   NA 142 12/12/2016 0745   K 4.2 07/04/2017 1150   K 4.5 12/12/2016 0745   CL 108 07/04/2017 1150   CO2 23 07/04/2017 1150   CO2 24 12/12/2016 0745   GLUCOSE 99 07/04/2017 1150   GLUCOSE 93 12/12/2016 0745   BUN 23 07/04/2017 1150   BUN 13.8 12/12/2016 0745   CREATININE 2.22 (H) 07/04/2017 1150   CREATININE 1.1 12/12/2016 0745   CALCIUM 9.6 07/04/2017 1150   CALCIUM 9.3 12/12/2016 0745   PROT 7.3 07/04/2017 1150   PROT 6.9 12/12/2016 0745   ALBUMIN 3.8 07/04/2017 1150   ALBUMIN 3.6 12/12/2016 0745   AST 17 07/04/2017 1150   AST 15 12/12/2016 0745   ALT 11 07/04/2017 1150   ALT 10 12/12/2016 0745   ALKPHOS 118 07/04/2017 1150   ALKPHOS 97 12/12/2016 0745   BILITOT 0.3 07/04/2017 1150   BILITOT 0.34 12/12/2016 0745   GFRNONAA 21 (L) 07/04/2017 1150   GFRAA 24 (L) 07/04/2017 1150    No results found for: SPEP, UPEP  Lab Results  Component Value Date   WBC 4.5 07/04/2017   NEUTROABS 2.8 07/04/2017   HGB 9.8 (L) 07/04/2017   HCT 29.7 (L) 07/04/2017   MCV  92.0 07/04/2017   PLT 190 07/04/2017      Chemistry      Component Value Date/Time   NA 138 07/04/2017 1150   NA 142 12/12/2016 0745   K 4.2 07/04/2017 1150   K 4.5 12/12/2016 0745   CL 108 07/04/2017 1150   CO2 23 07/04/2017 1150   CO2 24 12/12/2016 0745   BUN 23 07/04/2017 1150   BUN 13.8 12/12/2016 0745   CREATININE 2.22 (H) 07/04/2017 1150   CREATININE 1.1 12/12/2016 0745      Component Value Date/Time   CALCIUM 9.6 07/04/2017 1150   CALCIUM 9.3 12/12/2016 0745   ALKPHOS 118 07/04/2017 1150   ALKPHOS 97 12/12/2016 0745  AST 17 07/04/2017 1150   AST 15 12/12/2016 0745   ALT 11 07/04/2017 1150   ALT 10 12/12/2016 0745   BILITOT 0.3 07/04/2017 1150   BILITOT 0.34 12/12/2016 0745       RADIOGRAPHIC STUDIES: I have personally reviewed the radiological images as listed and agreed with the findings in the report. Ct Abdomen Pelvis Wo Contrast  Result Date: 07/09/2017 CLINICAL DATA:  Followup metastatic cervical carcinoma to lung and liver. Ongoing chemotherapy. Restaging. EXAM: CT CHEST, ABDOMEN AND PELVIS WITHOUT CONTRAST TECHNIQUE: Multidetector CT imaging of the chest, abdomen and pelvis was performed following the standard protocol without IV contrast. COMPARISON:  03/13/2017 FINDINGS: CT CHEST FINDINGS Cardiovascular: No acute findings. Mediastinum/Lymph Nodes: No masses or pathologically enlarged lymph nodes identified on this unenhanced exam. Lungs/Pleura: Small diffuse bilateral pulmonary metastases show interval increase in number and size since previous study. No evidence of pulmonary infiltrate or pleural effusion. Musculoskeletal:  No suspicious bone lesions identified. CT ABDOMEN AND PELVIS FINDINGS Hepatobiliary: No masses visualized on this unenhanced exam. Gallbladder is unremarkable. Pancreas: No mass or inflammatory changes identified on this unenhanced exam. Spleen:  Within normal limits in size. Adrenals/Urinary Tract: No evidence of urolithiasis or  hydronephrosis. Unremarkable appearance of bladder. Stomach/Bowel: Loop colostomy is again seen in the left lower quadrant. No evidence of bowel obstruction, inflammatory process, or abnormal fluid collections. Vascular/Lymphatic: No pathologically enlarged lymph nodes identified. No abdominal aortic aneurysm. Reproductive: Soft tissue mass in the area of the cervix measures 5.3 x 4.3 cm on image 110/2, without significant change. Hydrometros is again demonstrated. Ill-defined soft tissue density is also seen in both adnexal regions, measuring 5.5 x 3.2 cm on the right and 3.7 x 2.9 cm on the left. This is also stable since previous study. No new or enlarging soft tissue masses are identified. Other:  None. Musculoskeletal: No suspicious bone lesions identified. Mild compression fractures of the T12 and L5 vertebral bodies are new since 03/13/2017 exam. T11 vertebral body compression fracture is unchanged. IMPRESSION: Mild interval progression of diffuse bilateral pulmonary metastases since prior study. Stable soft tissue mass involving the cervix, with hydrometros. Stable bilateral adnexal soft tissue densities likely due to metastatic disease. No new or progressive metastatic disease identified within the abdomen or pelvis. Mild benign-appearing compression fractures of T12 and L5 vertebral bodies, new since 03/13/2017 exam. Electronically Signed   By: Earle Gell M.D.   On: 07/09/2017 14:10   Ct Chest Wo Contrast  Result Date: 07/09/2017 CLINICAL DATA:  Followup metastatic cervical carcinoma to lung and liver. Ongoing chemotherapy. Restaging. EXAM: CT CHEST, ABDOMEN AND PELVIS WITHOUT CONTRAST TECHNIQUE: Multidetector CT imaging of the chest, abdomen and pelvis was performed following the standard protocol without IV contrast. COMPARISON:  03/13/2017 FINDINGS: CT CHEST FINDINGS Cardiovascular: No acute findings. Mediastinum/Lymph Nodes: No masses or pathologically enlarged lymph nodes identified on this  unenhanced exam. Lungs/Pleura: Small diffuse bilateral pulmonary metastases show interval increase in number and size since previous study. No evidence of pulmonary infiltrate or pleural effusion. Musculoskeletal:  No suspicious bone lesions identified. CT ABDOMEN AND PELVIS FINDINGS Hepatobiliary: No masses visualized on this unenhanced exam. Gallbladder is unremarkable. Pancreas: No mass or inflammatory changes identified on this unenhanced exam. Spleen:  Within normal limits in size. Adrenals/Urinary Tract: No evidence of urolithiasis or hydronephrosis. Unremarkable appearance of bladder. Stomach/Bowel: Loop colostomy is again seen in the left lower quadrant. No evidence of bowel obstruction, inflammatory process, or abnormal fluid collections. Vascular/Lymphatic: No pathologically enlarged lymph nodes identified. No  abdominal aortic aneurysm. Reproductive: Soft tissue mass in the area of the cervix measures 5.3 x 4.3 cm on image 110/2, without significant change. Hydrometros is again demonstrated. Ill-defined soft tissue density is also seen in both adnexal regions, measuring 5.5 x 3.2 cm on the right and 3.7 x 2.9 cm on the left. This is also stable since previous study. No new or enlarging soft tissue masses are identified. Other:  None. Musculoskeletal: No suspicious bone lesions identified. Mild compression fractures of the T12 and L5 vertebral bodies are new since 03/13/2017 exam. T11 vertebral body compression fracture is unchanged. IMPRESSION: Mild interval progression of diffuse bilateral pulmonary metastases since prior study. Stable soft tissue mass involving the cervix, with hydrometros. Stable bilateral adnexal soft tissue densities likely due to metastatic disease. No new or progressive metastatic disease identified within the abdomen or pelvis. Mild benign-appearing compression fractures of T12 and L5 vertebral bodies, new since 03/13/2017 exam. Electronically Signed   By: Earle Gell M.D.   On:  07/09/2017 14:10    All questions were answered. The patient knows to call the clinic with any problems, questions or concerns. No barriers to learning was detected.  I spent 25 minutes counseling the patient face to face. The total time spent in the appointment was 40 minutes and more than 50% was on counseling and review of test results  Heath Lark, MD 07/16/2017 3:04 PM

## 2017-07-16 NOTE — Assessment & Plan Note (Signed)
We had multiple, extensive goals of care discussion in the past She is frail and tolerated treatment very poorly with multiple complications Since we stopped her chemotherapy, she is enjoying good quality of life We discussed extensively about potential stopping treatment and be enrolled in palliative care/hospice program The patient is undecided She has verbalized her desire to be DNR in the future but we do not have any formal advanced directive or living will Ultimately, we plan to revisit the issues about goals of care in our next visit/phone call discussion next week

## 2017-07-16 NOTE — Assessment & Plan Note (Addendum)
We had extensive discussion about goals of care and treatment options The patient would like treatment but is not willing to endure potential risks of side effects Previously, she had recurrent history of infection, pancytopenia, nausea and peripheral neuropathy from treatment I do not believe she can tolerate combination chemotherapy We have discussed the potential risks, benefits, side effects of carboplatin versus Taxol, including risk of pancytopenia, hair loss, neuropathy and others and ultimately she is undecided I will call her next week for further assessment of plan of care

## 2017-07-16 NOTE — Assessment & Plan Note (Signed)
I will have to dose reduce her future chemotherapy due to worsening renal function

## 2017-07-16 NOTE — Assessment & Plan Note (Signed)
She has diffuse disease progression in both lungs but she is not symptomatic Recommend observation only for now

## 2017-07-16 NOTE — Telephone Encounter (Signed)
Pt is OK to restart chemo on 5/23, 5/24 or the following week.

## 2017-07-16 NOTE — Telephone Encounter (Signed)
-----   Message from Heath Lark, MD sent at 07/16/2017  7:46 AM EDT ----- Regarding: decision Can you ask if patient has decided what to do? More chemo or hospice?

## 2017-07-16 NOTE — Assessment & Plan Note (Signed)
I am concerned about worsening neuropathy with future chemotherapy She would have to be dose reduced for future treatment

## 2017-07-23 ENCOUNTER — Other Ambulatory Visit: Payer: Self-pay | Admitting: Hematology and Oncology

## 2017-07-23 ENCOUNTER — Telehealth: Payer: Self-pay | Admitting: Hematology and Oncology

## 2017-07-23 NOTE — Telephone Encounter (Signed)
Mailed patient calendar of upcoming June appointments per 5/21 sch message

## 2017-07-23 NOTE — Telephone Encounter (Signed)
I spoke with the patient over the telephone We discussed goals of care and plan The patient would like to hold off further chemotherapy I plan to see her back in 3 weeks for further supportive care

## 2017-08-15 ENCOUNTER — Inpatient Hospital Stay: Payer: Medicare HMO | Attending: Hematology and Oncology

## 2017-08-15 ENCOUNTER — Encounter: Payer: Self-pay | Admitting: Hematology and Oncology

## 2017-08-15 ENCOUNTER — Inpatient Hospital Stay (HOSPITAL_BASED_OUTPATIENT_CLINIC_OR_DEPARTMENT_OTHER): Payer: Medicare HMO | Admitting: Hematology and Oncology

## 2017-08-15 DIAGNOSIS — G893 Neoplasm related pain (acute) (chronic): Secondary | ICD-10-CM

## 2017-08-15 DIAGNOSIS — C799 Secondary malignant neoplasm of unspecified site: Secondary | ICD-10-CM

## 2017-08-15 DIAGNOSIS — C7802 Secondary malignant neoplasm of left lung: Secondary | ICD-10-CM | POA: Insufficient documentation

## 2017-08-15 DIAGNOSIS — Z7189 Other specified counseling: Secondary | ICD-10-CM

## 2017-08-15 DIAGNOSIS — C7801 Secondary malignant neoplasm of right lung: Secondary | ICD-10-CM | POA: Insufficient documentation

## 2017-08-15 DIAGNOSIS — Z79899 Other long term (current) drug therapy: Secondary | ICD-10-CM | POA: Diagnosis not present

## 2017-08-15 DIAGNOSIS — Z9221 Personal history of antineoplastic chemotherapy: Secondary | ICD-10-CM | POA: Insufficient documentation

## 2017-08-15 DIAGNOSIS — C539 Malignant neoplasm of cervix uteri, unspecified: Secondary | ICD-10-CM | POA: Diagnosis not present

## 2017-08-15 DIAGNOSIS — C7989 Secondary malignant neoplasm of other specified sites: Secondary | ICD-10-CM

## 2017-08-15 DIAGNOSIS — C787 Secondary malignant neoplasm of liver and intrahepatic bile duct: Secondary | ICD-10-CM

## 2017-08-15 MED ORDER — SODIUM CHLORIDE 0.9% FLUSH
10.0000 mL | Freq: Once | INTRAVENOUS | Status: AC
  Administered 2017-08-15: 10 mL
  Filled 2017-08-15: qty 10

## 2017-08-15 MED ORDER — HEPARIN SOD (PORK) LOCK FLUSH 100 UNIT/ML IV SOLN
500.0000 [IU] | Freq: Once | INTRAVENOUS | Status: AC
  Administered 2017-08-15: 500 [IU]
  Filled 2017-08-15: qty 5

## 2017-08-15 NOTE — Assessment & Plan Note (Signed)
She has bilateral lung metastasis but remained asymptomatic For now, I recommend observation only.

## 2017-08-15 NOTE — Progress Notes (Signed)
[facility] OFFICE PROGRESS NOTE  Patient Care Team: [name], MD as PCP - General (Family Medicine) Carmel Sacramento, OD as Consulting Physician (Optometry)  ASSESSMENT & PLAN:  Cervical cancer [facility]) Previously, we had multiple appointments with discussion about goals of care and treatment options She tolerated chemotherapy very poorly with recurrent history of infection, pancytopenia, nausea and peripheral neuropathy from treatment I do not believe she can tolerate combination chemotherapy Ultimately, the patient has made informed decision to proceed with observation only Today, she is not symptomatic I plan to see her back again in 6 weeks for further follow-up and supportive care  Malignant neoplasm metastatic to left lung [facility]) She has bilateral lung metastasis but remained asymptomatic For now, I recommend observation only.  Cancer associated pain She has severe bilateral flank pain likely due to cancer associated pain I recommend avoiding NSAID due to renal failure I recommend low-dose Dilaudid as needed and it is controlling her pain well She is reminded about risk of constipation while on Dilaudid  Goals of care, counseling/discussion We had multiple, extensive goals of care discussion in the past She has verbalized her desire to be DNR in the future but we do not have any formal advanced directive or living will For now, she is enjoying her life without chemotherapy   No orders of the defined types were placed in this encounter.   INTERVAL HISTORY: Please see below for problem oriented charting. She returns for further follow-up Her chronic back pain is stable with narcotic prescription and Tylenol as needed She denies recent infection She has occasional brown vaginal discharge but it does not bother her Her appetite is stable even though she has lost some weight She denies significant constipation or bloating She denies chest pain, cough or  shortness of breath  SUMMARY OF ONCOLOGIC HISTORY: Oncology History   PD-L 1 testing neg     Cervical cancer ([place])   04/26/2015 Imaging    Numerous bilateral pulmonary nodules consistent with metastatic disease are associated with liver lesions, omental nodules, mesenteric nodules, necrotic retroperitoneal lymphadenopathy in a mixed cystic and solid mass in the central pelvis involving the uterus extending into the cul-de-sac and both adnexal regions. Uterine primary (likely endometrial) is favored over an ovarian primary malignancy given the lack of ascites, associated intrahepatic metastases and pulmonary involvement.      05/04/2015 Procedure    Technically successful CT-guided core biopsy of left pelvic mass      05/04/2015 Pathology Results    Soft Tissue Needle Core Biopsy, left ext iliac mass METASTATIC HIGH GRADE POORLY DIFFERENTIATED CARCINOMA CONSISTENT WITH CERVICAL ORIGIN Microscopic Comment The neoplasm stains positive for cervical markers : p16, ER, ck8/18, and p63 (focal), TTF-1 (focal) and negative for synaptophysin, Chromogranin (neuroendocrine markers) and ck5/6. The morphology and immunohistochemistry staining pattern support these cells are GYN cervical origin.Spec      05/10/2015 Imaging    Innumerable (> than 40) pulmonary nodules randomly distributed throughout both lungs, largest 1.1 cm, in keeping with pulmonary metastases. 2. Left supraclavicular and left hilar nodal metastases. 3. Re- demonstration of liver metastases. 4. Right thyroid lobe 1.4 cm pulmonary nodule, for which no further evaluation is recommended. 5. Stable mild superior T11 vertebral compression deformity.      05/12/2015 - 05/12/2015 Chemotherapy    She received 1 dose of carbo/taxol only      05/15/2015 Imaging    Widely metastatic cervical carcinoma with unchanged appearance compared to 04/26/2015. Bulky tumor narrows and likely  invades the rectum/distal sigmoid with progressive proximal stool  retention      05/15/2015 - 05/23/2015 Hospital Admission    She was admitted for severe bowel obstruction requiring urgent diversion colostomy      05/18/2015 Surgery    She underwent LAPAROSCOPIC DIVERTING SIGMOID LOOP COLOSTOMY for bowel obstruction       06/10/2015 Procedure    Successful placement of a right internal jugular approach power injectable Port-A-Cath. The catheter is ready for immediate use      06/23/2015 - 03/01/2016 Chemotherapy    She received combination treatment with cisplatin, Taxol and Avastin. Avastin was started on 5/11. Dose of Taxol was modified due to neuropathy and cisplatin was omited last dose due to 'weakness'      09/13/2015 Imaging    Significant interval treatment response. Pulmonary and liver metastases have significantly decreased in size. Thoracic adenopathy has decreased in size. Mixed attenuation mass encompassing the uterus and bilateral adnexal regions has significantly decreased in size. No residual visualized peritoneal tumor implants. No new or progressive metastatic disease. 2. No evidence of bowel obstruction or acute bowel inflammation status post diverting sigmoid colostomy. 3. Additional findings include mild aortic atherosclerosis and Stable mild chronic superior T12 compression fracture.      12/14/2015 Imaging    Stable to improved interval exam. Multiple bilateral pulmonary nodules are stable in size to decreased and the liver lesions are stable to decreased. Mixed attenuation lesion encompassing the uterus and adnexal region shows decrease in size. There is no evidence for new or progressive disease on today's exam.      05/15/2016 Imaging    Findings today reflecting mixed interval response to therapy. 2. There has been mild increase in size of pulmonary nodules. 3. Interval increase in size of pelvic mass 4. Further regression of liver metastases 5. There is a new soft tissue nodule within the right lower quadrant peritoneal cavity.  Suspicious peritoneal disease      06/07/2016 - 06/06/2017 Chemotherapy    She received chemo with cisplatin and gemzar      06/14/2016 Adverse Reaction    Cycle 1, day 8 is put on hold due to severe pancytopenia      08/29/2016 Imaging    Ct scan of chest, abdomen and pelvis: 1. General improvement, with reduced size of the scattered pulmonary nodules, and also a much more normal contour and appearance of the uterus, without the visible enhancing irregular mass shown on the 05/15/2016 exam involving the uterus and cervix. There still some mild soft tissue prominence of the cervix, and a cystic lesion of the left ovary as noted above. No pathologic adenopathy. 2. The right lower quadrant omental nodule has considerably improved and nearly resolved. 3. The prior hepatic metastatic lesions have essentially resolved, with only minimal residual linear hypodensity in segment 4 at the site of the prior large metastatic lesion shown on 05/15/2015.  4. Other imaging findings of potential clinical significance: Left lower quadrant colostomy. Chronic superior endplate compression at T11. Bony demineralization. Trace free pelvic fluid in the cul-de-sac. Central disc protrusion at L3-4. Left mid kidney hypodense lesion is likely a cyst.      12/12/2016 Imaging    1. Suspected minimal enlargement of at least 1 pulmonary nodule. The majority of pulmonary nodules are similar. Cannot exclude minimal pulmonary metastasis progression. 2. No evidence of extrathoracic progressive metastatic disease. Similar soft tissue fullness within the uterine cervix and similar to slight decrease in size of a tiny omental nodule.  3. Aortic Atherosclerosis (ICD10-I70.0). 4. Chronic T10 compression deformity      03/13/2017 Imaging    1. Interval progression of pre-existing pulmonary metastases with interval development of new pulmonary metastatic lesions. 2. Interval development of new and progression of pre-existing omental  metastases. 3. Marked interval progression of cervical disease with dilated fluid-filled endometrial canal in the uterine fundus. The cervical disease infiltrates into the lower uterine segment and likely extends into the adnexal space bilaterally. Posterior extent of cervical disease is tethered to the distal sigmoid colon in this patient with a sigmoid loop colostomy. 4. Stable atrophy of the medial segment left liver.      03/22/2017 - 06/06/2017 Chemotherapy    The patient had chemo cisplatin and gemzar       04/26/2017 Imaging    1. Bladder wall irregularity anteriorly, suspicious for cystitis. No specific evidence of fistula to uterus or vagina. Voiding cystogram would likely be of greatest sensitivity. 2. Similar to slight decrease in size of infiltrative mass centered about the lower uterine segment. Presumed sigmoid colonic wall involvement.      07/09/2017 Imaging    Mild interval progression of diffuse bilateral pulmonary metastases since prior study.  Stable soft tissue mass involving the cervix, with hydrometros. Stable bilateral adnexal soft tissue densities likely due to metastatic disease. No new or progressive metastatic disease identified within the abdomen or pelvis.  Mild benign-appearing compression fractures of T12 and L5 vertebral bodies, new since 03/13/2017 exam.       REVIEW OF SYSTEMS:   Constitutional: Denies fevers, chills  Eyes: Denies blurriness of vision Ears, nose, mouth, throat, and face: Denies mucositis or sore throat Respiratory: Denies cough, dyspnea or wheezes Cardiovascular: Denies palpitation, chest discomfort or lower extremity swelling Gastrointestinal:  Denies nausea, heartburn or change in bowel habits Skin: Denies abnormal skin rashes Lymphatics: Denies new lymphadenopathy or easy bruising Neurological:Denies numbness, tingling or new weaknesses Behavioral/Psych: Mood is stable, no new changes  All other systems were reviewed with the  patient and are negative.  I have reviewed the past medical history, past surgical history, social history and family history with the patient and they are unchanged from previous note.  ALLERGIES:  has No Known Allergies.  MEDICATIONS:  Current Outpatient Medications  Medication Sig Dispense Refill  . cholecalciferol (VITAMIN D) 1000 units tablet Take 1,000 Units by mouth daily.    Marland Kitchen HYDROmorphone (DILAUDID) 4 MG tablet Take 1 tablet (4 mg total) by mouth every 4 (four) hours as needed for severe pain. 30 tablet 0  . lidocaine-prilocaine (EMLA) cream Apply 1 application topically as needed. 30 g 6  . methocarbamol (ROBAXIN) 500 MG tablet Take 1 tablet (500 mg total) by mouth every 8 (eight) hours as needed for muscle spasms. 30 tablet 0  . omeprazole (PRILOSEC) 20 MG capsule TAKE 1 CAPSULE BY MOUTH EVERY DAY 90 capsule 0  . ondansetron (ZOFRAN) 8 MG tablet Take 1 tablet (8 mg total) by mouth every 8 (eight) hours as needed for nausea. 30 tablet 3  . oxybutynin (DITROPAN-XL) 5 MG 24 hr tablet TAKE 1 TABLET BY MOUTH EVERYDAY AT BEDTIME 30 tablet 3  . prochlorperazine (COMPAZINE) 10 MG tablet Take 1 tablet (10 mg total) by mouth every 6 (six) hours as needed for nausea or vomiting. 30 tablet 1  . temazepam (RESTORIL) 15 MG capsule Take 15 mg by mouth at bedtime as needed for sleep.     No current facility-administered medications for this visit.    Facility-Administered  Medications Ordered in Other Visits  Medication Dose Route Frequency Provider Last Rate Last Dose  . heparin lock flush 100 unit/mL  500 Units Intravenous Once Livesay, Lennis P, MD      . sodium chloride flush (NS) 0.9 % injection 10 mL  10 mL Intravenous PRN Livesay, Lennis P, MD      . sodium chloride flush (NS) 0.9 % injection 10 mL  10 mL Intracatheter PRN Alvy Bimler, Keely Drennan, MD   10 mL at 03/29/17 1658    PHYSICAL EXAMINATION: ECOG PERFORMANCE STATUS: 1 - Symptomatic but completely ambulatory  Vitals:   08/15/17 1221   BP: 117/62  Pulse: 73  Resp: 18  Temp: 97.8 F (36.6 C)  SpO2: 100%   Filed Weights   08/15/17 1221  Weight: 165 lb 1.6 oz (74.9 kg)    GENERAL:alert, no distress and comfortable SKIN: skin color, texture, turgor are normal, no rashes or significant lesions EYES: normal, Conjunctiva are pink and non-injected, sclera clear OROPHARYNX:no exudate, no erythema and lips, buccal mucosa, and tongue normal  NECK: supple, thyroid normal size, non-tender, without nodularity LYMPH:  no palpable lymphadenopathy in the cervical, axillary or inguinal LUNGS: clear to auscultation and percussion with normal breathing effort HEART: regular rate & rhythm and no murmurs and no lower extremity edema ABDOMEN:abdomen soft, non-tender and normal bowel sounds Musculoskeletal:no cyanosis of digits and no clubbing  NEURO: alert & oriented x 3 with fluent speech, no focal motor/sensory deficits  LABORATORY DATA:  I have reviewed the data as listed    Component Value Date/Time   NA 138 07/04/2017 1150   NA 142 12/12/2016 0745   K 4.2 07/04/2017 1150   K 4.5 12/12/2016 0745   CL 108 07/04/2017 1150   CO2 23 07/04/2017 1150   CO2 24 12/12/2016 0745   GLUCOSE 99 07/04/2017 1150   GLUCOSE 93 12/12/2016 0745   BUN 23 07/04/2017 1150   BUN 13.8 12/12/2016 0745   CREATININE 2.22 (H) 07/04/2017 1150   CREATININE 1.1 12/12/2016 0745   CALCIUM 9.6 07/04/2017 1150   CALCIUM 9.3 12/12/2016 0745   PROT 7.3 07/04/2017 1150   PROT 6.9 12/12/2016 0745   ALBUMIN 3.8 07/04/2017 1150   ALBUMIN 3.6 12/12/2016 0745   AST 17 07/04/2017 1150   AST 15 12/12/2016 0745   ALT 11 07/04/2017 1150   ALT 10 12/12/2016 0745   ALKPHOS 118 07/04/2017 1150   ALKPHOS 97 12/12/2016 0745   BILITOT 0.3 07/04/2017 1150   BILITOT 0.34 12/12/2016 0745   GFRNONAA 21 (L) 07/04/2017 1150   GFRAA 24 (L) 07/04/2017 1150    No results found for: SPEP, UPEP  Lab Results  Component Value Date   WBC 4.5 07/04/2017   NEUTROABS  2.8 07/04/2017   HGB 9.8 (L) 07/04/2017   HCT 29.7 (L) 07/04/2017   MCV 92.0 07/04/2017   PLT 190 07/04/2017      Chemistry      Component Value Date/Time   NA 138 07/04/2017 1150   NA 142 12/12/2016 0745   K 4.2 07/04/2017 1150   K 4.5 12/12/2016 0745   CL 108 07/04/2017 1150   CO2 23 07/04/2017 1150   CO2 24 12/12/2016 0745   BUN 23 07/04/2017 1150   BUN 13.8 12/12/2016 0745   CREATININE 2.22 (H) 07/04/2017 1150   CREATININE 1.1 12/12/2016 0745      Component Value Date/Time   CALCIUM 9.6 07/04/2017 1150   CALCIUM 9.3 12/12/2016 0745   ALKPHOS 118 07/04/2017  1150   ALKPHOS 97 12/12/2016 0745   AST 17 07/04/2017 1150   AST 15 12/12/2016 0745   ALT 11 07/04/2017 1150   ALT 10 12/12/2016 0745   BILITOT 0.3 07/04/2017 1150   BILITOT 0.34 12/12/2016 0745      All questions were answered. The patient knows to call the clinic with any problems, questions or concerns. No barriers to learning was detected.  I spent 15 minutes counseling the patient face to face. The total time spent in the appointment was 20 minutes and more than 50% was on counseling and review of test results  Heath Lark, MD 08/15/2017 1:53 PM

## 2017-08-15 NOTE — Assessment & Plan Note (Signed)
We had multiple, extensive goals of care discussion in the past She has verbalized her desire to be DNR in the future but we do not have any formal advanced directive or living will For now, she is enjoying her life without chemotherapy

## 2017-08-15 NOTE — Assessment & Plan Note (Signed)
Previously, we had multiple appointments with discussion about goals of care and treatment options She tolerated chemotherapy very poorly with recurrent history of infection, pancytopenia, nausea and peripheral neuropathy from treatment I do not believe she can tolerate combination chemotherapy Ultimately, the patient has made informed decision to proceed with observation only Today, she is not symptomatic I plan to see her back again in 6 weeks for further follow-up and supportive care

## 2017-08-15 NOTE — Assessment & Plan Note (Signed)
She has severe bilateral flank pain likely due to cancer associated pain I recommend avoiding NSAID due to renal failure I recommend low-dose Dilaudid as needed and it is controlling her pain well She is reminded about risk of constipation while on Dilaudid

## 2017-08-16 ENCOUNTER — Telehealth: Payer: Self-pay | Admitting: Hematology and Oncology

## 2017-08-16 NOTE — Telephone Encounter (Signed)
Mailed patient calendar of upcoming July appointments

## 2017-09-04 ENCOUNTER — Telehealth: Payer: Self-pay | Admitting: Hematology and Oncology

## 2017-09-04 ENCOUNTER — Telehealth: Payer: Self-pay

## 2017-09-04 NOTE — Telephone Encounter (Signed)
Called patient regarding 7/8

## 2017-09-04 NOTE — Telephone Encounter (Signed)
She called and left a message to call her.   Called back. She is feeling tired. She had chills last week with no fever that she is aware of. She started having vaginal brown discharge on Saturday, that has changed to a white discharge with a smell. Offered appt to see [place], [place]. She declined, she is at the beach now and coming back on Saturday. She is requesting a earlier appt. Scheduling message sent for earlier appt. Instructed to call office if needed and to go to ER or urgent care if needed. She verbalized understanding.

## 2017-09-09 ENCOUNTER — Inpatient Hospital Stay (HOSPITAL_BASED_OUTPATIENT_CLINIC_OR_DEPARTMENT_OTHER): Payer: Medicare HMO | Admitting: *Deleted

## 2017-09-09 ENCOUNTER — Telehealth: Payer: Self-pay | Admitting: Hematology and Oncology

## 2017-09-09 ENCOUNTER — Inpatient Hospital Stay (HOSPITAL_BASED_OUTPATIENT_CLINIC_OR_DEPARTMENT_OTHER): Payer: Medicare HMO | Admitting: Hematology and Oncology

## 2017-09-09 ENCOUNTER — Inpatient Hospital Stay: Payer: Medicare HMO | Attending: Hematology and Oncology

## 2017-09-09 ENCOUNTER — Encounter: Payer: Self-pay | Admitting: Hematology and Oncology

## 2017-09-09 VITALS — BP 110/68 | HR 87 | Temp 98.5°F | Resp 18 | Ht 64.5 in | Wt 157.8 lb

## 2017-09-09 DIAGNOSIS — C7801 Secondary malignant neoplasm of right lung: Secondary | ICD-10-CM | POA: Diagnosis not present

## 2017-09-09 DIAGNOSIS — N184 Chronic kidney disease, stage 4 (severe): Secondary | ICD-10-CM

## 2017-09-09 DIAGNOSIS — C539 Malignant neoplasm of cervix uteri, unspecified: Secondary | ICD-10-CM

## 2017-09-09 DIAGNOSIS — C787 Secondary malignant neoplasm of liver and intrahepatic bile duct: Secondary | ICD-10-CM

## 2017-09-09 DIAGNOSIS — C7802 Secondary malignant neoplasm of left lung: Secondary | ICD-10-CM | POA: Diagnosis not present

## 2017-09-09 DIAGNOSIS — N898 Other specified noninflammatory disorders of vagina: Secondary | ICD-10-CM

## 2017-09-09 DIAGNOSIS — R5383 Other fatigue: Secondary | ICD-10-CM

## 2017-09-09 DIAGNOSIS — C781 Secondary malignant neoplasm of mediastinum: Secondary | ICD-10-CM | POA: Diagnosis not present

## 2017-09-09 DIAGNOSIS — C799 Secondary malignant neoplasm of unspecified site: Secondary | ICD-10-CM

## 2017-09-09 DIAGNOSIS — Z79899 Other long term (current) drug therapy: Secondary | ICD-10-CM | POA: Insufficient documentation

## 2017-09-09 DIAGNOSIS — C786 Secondary malignant neoplasm of retroperitoneum and peritoneum: Secondary | ICD-10-CM | POA: Diagnosis not present

## 2017-09-09 DIAGNOSIS — C7989 Secondary malignant neoplasm of other specified sites: Secondary | ICD-10-CM

## 2017-09-09 DIAGNOSIS — G893 Neoplasm related pain (acute) (chronic): Secondary | ICD-10-CM | POA: Insufficient documentation

## 2017-09-09 DIAGNOSIS — Z9221 Personal history of antineoplastic chemotherapy: Secondary | ICD-10-CM | POA: Diagnosis not present

## 2017-09-09 LAB — COMPREHENSIVE METABOLIC PANEL
ALBUMIN: 3.4 g/dL — AB (ref 3.5–5.0)
ALK PHOS: 236 U/L — AB (ref 38–126)
ALT: 19 U/L (ref 0–44)
ANION GAP: 7 (ref 5–15)
AST: 21 U/L (ref 15–41)
BUN: 20 mg/dL (ref 8–23)
CALCIUM: 9.6 mg/dL (ref 8.9–10.3)
CHLORIDE: 105 mmol/L (ref 98–111)
CO2: 28 mmol/L (ref 22–32)
Creatinine, Ser: 1.28 mg/dL — ABNORMAL HIGH (ref 0.44–1.00)
GFR calc Af Amer: 48 mL/min — ABNORMAL LOW (ref >60)
GFR calc non Af Amer: 41 mL/min — ABNORMAL LOW (ref >60)
GLUCOSE: 101 mg/dL — AB (ref 70–99)
Potassium: 4.5 mmol/L (ref 3.5–5.1)
SODIUM: 140 mmol/L (ref 135–145)
Total Bilirubin: 0.4 mg/dL (ref 0.3–1.2)
Total Protein: 7.8 g/dL (ref 6.5–8.1)

## 2017-09-09 LAB — CBC WITH DIFFERENTIAL/PLATELET
BASOS PCT: 0 %
Basophils Absolute: 0 10*3/uL (ref 0.0–0.1)
EOS ABS: 0.2 10*3/uL (ref 0.0–0.5)
EOS PCT: 2 %
HCT: 33.5 % — ABNORMAL LOW (ref 34.8–46.6)
Hemoglobin: 10.9 g/dL — ABNORMAL LOW (ref 11.6–15.9)
LYMPHS ABS: 1.7 10*3/uL (ref 0.9–3.3)
Lymphocytes Relative: 18 %
MCH: 28.3 pg (ref 25.1–34.0)
MCHC: 32.5 g/dL (ref 31.5–36.0)
MCV: 87 fL (ref 79.5–101.0)
MONO ABS: 0.8 10*3/uL (ref 0.1–0.9)
MONOS PCT: 9 %
NEUTROS PCT: 71 %
Neutro Abs: 6.7 10*3/uL — ABNORMAL HIGH (ref 1.5–6.5)
Platelets: 223 10*3/uL (ref 145–400)
RBC: 3.85 MIL/uL (ref 3.70–5.45)
RDW: 13.4 % (ref 11.2–14.5)
WBC: 9.4 10*3/uL (ref 3.9–10.3)

## 2017-09-09 MED ORDER — SODIUM CHLORIDE 0.9% FLUSH
10.0000 mL | Freq: Once | INTRAVENOUS | Status: AC
  Administered 2017-09-09: 10 mL
  Filled 2017-09-09: qty 10

## 2017-09-09 MED ORDER — HEPARIN SOD (PORK) LOCK FLUSH 100 UNIT/ML IV SOLN
500.0000 [IU] | Freq: Once | INTRAVENOUS | Status: AC
  Administered 2017-09-09: 500 [IU]
  Filled 2017-09-09: qty 5

## 2017-09-09 NOTE — Assessment & Plan Note (Signed)
Unfortunately, she has obvious signs of clinical deterioration and progression of disease I have another long discussion with the patient about risk and benefits of pursuing palliative chemotherapy versus hospice She understood that she would likely have profound side effects from treatment with poor quality of life and minimum benefit from treatment She is willing to consider treatment again I plan to repeat CT scan of the chest, abdomen and pelvis for repeat staging and see her back next week for further discussion about plan of care We discussed briefly about treatment either single agent carboplatin or weekly Taxol

## 2017-09-09 NOTE — Assessment & Plan Note (Signed)
She has been complaining of intermittent cough I will order CT scan of the chest for objective assessment of disease burden in her lungs

## 2017-09-09 NOTE — Assessment & Plan Note (Signed)
She has intermittent severe kidney failure and is at high risk due to risk of hydronephrosis from untreated cancer For that reason, I will avoid IV contrast

## 2017-09-09 NOTE — Progress Notes (Signed)
Complete 09/09/17 

## 2017-09-09 NOTE — Progress Notes (Signed)
[place] OFFICE PROGRESS NOTE  Patient Care Team: [name], MD as PCP - General (Family Medicine) Carmel Sacramento, OD as Consulting Physician (Optometry)  ASSESSMENT & PLAN:  Cervical cancer [facility]) Unfortunately, she has obvious signs of clinical deterioration and progression of disease I have another long discussion with the patient about risk and benefits of pursuing palliative chemotherapy versus hospice She understood that she would likely have profound side effects from treatment with poor quality of life and minimum benefit from treatment She is willing to consider treatment again I plan to repeat CT scan of the chest, abdomen and pelvis for repeat staging and see her back next week for further discussion about plan of care We discussed briefly about treatment either single agent carboplatin or weekly Taxol  Cancer associated pain She has severe bilateral flank pain/back pain likely due to cancer associated pain I recommend avoiding NSAID due to renal failure I recommend low-dose Dilaudid as needed and it is controlling her pain well  Chronic kidney disease (CKD), stage IV (severe) ([place]) She has intermittent severe kidney failure and is at high risk due to risk of hydronephrosis from untreated cancer For that reason, I will avoid IV contrast  Malignant neoplasm metastatic to left lung [facility]) She has been complaining of intermittent cough I will order CT scan of the chest for objective assessment of disease burden in her lungs  Other fatigue She complained of excessive fatigue which I suspect is due to cancer progression She is mildly anemic  Vaginal discharge She has intermittent vaginal discharge due to cancer I recommend repeat CT for objective assessment and she agree with the plan of care   Orders Placed This Encounter  Procedures  . CT Chest Wo Contrast    Standing Status:   Future    Standing Expiration Date:   09/09/2018    Order Specific  Question:   Preferred imaging location?    Answer:   [facility]    Order Specific Question:   Radiology Contrast Protocol - do NOT remove file path    Answer:   \\[email or web address]  . CT Abdomen Pelvis Wo Contrast    Standing Status:   Future    Standing Expiration Date:   09/09/2018    Order Specific Question:   Preferred imaging location?    Answer:   [facility]    Order Specific Question:   Is Oral Contrast requested for this exam?    Answer:   Yes, Per Radiology protocol    Order Specific Question:   Radiology Contrast Protocol - do NOT remove file path    Answer:   \\[email or web address]  . Comprehensive metabolic panel    Standing Status:   Standing    Number of Occurrences:   22    Standing Expiration Date:   09/10/2018  . CBC with Differential/Platelet    Standing Status:   Standing    Number of Occurrences:   22    Standing Expiration Date:   09/10/2018  . Magnesium    Standing Status:   Standing    Number of Occurrences:   22    Standing Expiration Date:   09/10/2018    INTERVAL HISTORY: Please see below for problem oriented charting. She returns for further follow-up Her back pain is stable but she continues to have deep lower pain in her pelvis that is well controlled with current prescription pain medicine She has noticed some more vaginal discharge which is white in color  without blood She complained of some chills Her appetite is poor and she has lost a lot of weight, almost 8 pounds since last time I saw her She complained of fatigue and feeling weak She has occasional nonproductive cough She denies residual peripheral neuropathy from prior treatment  SUMMARY OF ONCOLOGIC HISTORY: Oncology History   PD-L 1 testing neg     Cervical cancer ([place])   04/26/2015 Imaging    Numerous bilateral pulmonary nodules consistent with metastatic disease are associated with liver lesions, omental nodules, mesenteric  nodules, necrotic retroperitoneal lymphadenopathy in a mixed cystic and solid mass in the central pelvis involving the uterus extending into the cul-de-sac and both adnexal regions. Uterine primary (likely endometrial) is favored over an ovarian primary malignancy given the lack of ascites, associated intrahepatic metastases and pulmonary involvement.      05/04/2015 Procedure    Technically successful CT-guided core biopsy of left pelvic mass      05/04/2015 Pathology Results    Soft Tissue Needle Core Biopsy, left ext iliac mass METASTATIC HIGH GRADE POORLY DIFFERENTIATED CARCINOMA CONSISTENT WITH CERVICAL ORIGIN Microscopic Comment The neoplasm stains positive for cervical markers : p16, ER, ck8/18, and p63 (focal), TTF-1 (focal) and negative for synaptophysin, Chromogranin (neuroendocrine markers) and ck5/6. The morphology and immunohistochemistry staining pattern support these cells are GYN cervical origin.Spec      05/10/2015 Imaging    Innumerable (> than 40) pulmonary nodules randomly distributed throughout both lungs, largest 1.1 cm, in keeping with pulmonary metastases. 2. Left supraclavicular and left hilar nodal metastases. 3. Re- demonstration of liver metastases. 4. Right thyroid lobe 1.4 cm pulmonary nodule, for which no further evaluation is recommended. 5. Stable mild superior T11 vertebral compression deformity.      05/12/2015 - 05/12/2015 Chemotherapy    She received 1 dose of carbo/taxol only      05/15/2015 Imaging    Widely metastatic cervical carcinoma with unchanged appearance compared to 04/26/2015. Bulky tumor narrows and likely invades the rectum/distal sigmoid with progressive proximal stool retention      05/15/2015 - 05/23/2015 Hospital Admission    She was admitted for severe bowel obstruction requiring urgent diversion colostomy      05/18/2015 Surgery    She underwent LAPAROSCOPIC DIVERTING SIGMOID LOOP COLOSTOMY for bowel obstruction       06/10/2015 Procedure     Successful placement of a right internal jugular approach power injectable Port-A-Cath. The catheter is ready for immediate use      06/23/2015 - 03/01/2016 Chemotherapy    She received combination treatment with cisplatin, Taxol and Avastin. Avastin was started on 5/11. Dose of Taxol was modified due to neuropathy and cisplatin was omited last dose due to 'weakness'      09/13/2015 Imaging    Significant interval treatment response. Pulmonary and liver metastases have significantly decreased in size. Thoracic adenopathy has decreased in size. Mixed attenuation mass encompassing the uterus and bilateral adnexal regions has significantly decreased in size. No residual visualized peritoneal tumor implants. No new or progressive metastatic disease. 2. No evidence of bowel obstruction or acute bowel inflammation status post diverting sigmoid colostomy. 3. Additional findings include mild aortic atherosclerosis and Stable mild chronic superior T12 compression fracture.      12/14/2015 Imaging    Stable to improved interval exam. Multiple bilateral pulmonary nodules are stable in size to decreased and the liver lesions are stable to decreased. Mixed attenuation lesion encompassing the uterus and adnexal region shows decrease in size. There is no  evidence for new or progressive disease on today's exam.      05/15/2016 Imaging    Findings today reflecting mixed interval response to therapy. 2. There has been mild increase in size of pulmonary nodules. 3. Interval increase in size of pelvic mass 4. Further regression of liver metastases 5. There is a new soft tissue nodule within the right lower quadrant peritoneal cavity. Suspicious peritoneal disease      06/07/2016 - 06/06/2017 Chemotherapy    She received chemo with cisplatin and gemzar      06/14/2016 Adverse Reaction    Cycle 1, day 8 is put on hold due to severe pancytopenia      08/29/2016 Imaging    Ct scan of chest, abdomen and pelvis: 1.  General improvement, with reduced size of the scattered pulmonary nodules, and also a much more normal contour and appearance of the uterus, without the visible enhancing irregular mass shown on the 05/15/2016 exam involving the uterus and cervix. There still some mild soft tissue prominence of the cervix, and a cystic lesion of the left ovary as noted above. No pathologic adenopathy. 2. The right lower quadrant omental nodule has considerably improved and nearly resolved. 3. The prior hepatic metastatic lesions have essentially resolved, with only minimal residual linear hypodensity in segment 4 at the site of the prior large metastatic lesion shown on 05/15/2015.  4. Other imaging findings of potential clinical significance: Left lower quadrant colostomy. Chronic superior endplate compression at T11. Bony demineralization. Trace free pelvic fluid in the cul-de-sac. Central disc protrusion at L3-4. Left mid kidney hypodense lesion is likely a cyst.      12/12/2016 Imaging    1. Suspected minimal enlargement of at least 1 pulmonary nodule. The majority of pulmonary nodules are similar. Cannot exclude minimal pulmonary metastasis progression. 2. No evidence of extrathoracic progressive metastatic disease. Similar soft tissue fullness within the uterine cervix and similar to slight decrease in size of a tiny omental nodule. 3. Aortic Atherosclerosis (ICD10-I70.0). 4. Chronic T10 compression deformity      03/13/2017 Imaging    1. Interval progression of pre-existing pulmonary metastases with interval development of new pulmonary metastatic lesions. 2. Interval development of new and progression of pre-existing omental metastases. 3. Marked interval progression of cervical disease with dilated fluid-filled endometrial canal in the uterine fundus. The cervical disease infiltrates into the lower uterine segment and likely extends into the adnexal space bilaterally. Posterior extent of cervical disease is  tethered to the distal sigmoid colon in this patient with a sigmoid loop colostomy. 4. Stable atrophy of the medial segment left liver.      03/22/2017 - 06/06/2017 Chemotherapy    The patient had chemo cisplatin and gemzar       04/26/2017 Imaging    1. Bladder wall irregularity anteriorly, suspicious for cystitis. No specific evidence of fistula to uterus or vagina. Voiding cystogram would likely be of greatest sensitivity. 2. Similar to slight decrease in size of infiltrative mass centered about the lower uterine segment. Presumed sigmoid colonic wall involvement.      07/09/2017 Imaging    Mild interval progression of diffuse bilateral pulmonary metastases since prior study.  Stable soft tissue mass involving the cervix, with hydrometros. Stable bilateral adnexal soft tissue densities likely due to metastatic disease. No new or progressive metastatic disease identified within the abdomen or pelvis.  Mild benign-appearing compression fractures of T12 and L5 vertebral bodies, new since 03/13/2017 exam.      09/09/2017 Cancer Staging  Staging form: Cervix Uteri, AJCC 8th Edition - Clinical: Stage IVB (cTX, cN1, pM1) - Signed by Heath Lark, MD on 09/09/2017       REVIEW OF SYSTEMS:   Constitutional: Denies fevers, chills  Eyes: Denies blurriness of vision Ears, nose, mouth, throat, and face: Denies mucositis or sore throat Respiratory: Denies cough, dyspnea or wheezes Cardiovascular: Denies palpitation, chest discomfort or lower extremity swelling Gastrointestinal:  Denies nausea, heartburn or change in bowel habits Skin: Denies abnormal skin rashes Lymphatics: Denies new lymphadenopathy or easy bruising Behavioral/Psych: Mood is stable, no new changes  All other systems were reviewed with the patient and are negative.  I have reviewed the past medical history, past surgical history, social history and family history with the patient and they are unchanged from previous  note.  ALLERGIES:  has No Known Allergies.  MEDICATIONS:  Current Outpatient Medications  Medication Sig Dispense Refill  . cholecalciferol (VITAMIN D) 1000 units tablet Take 1,000 Units by mouth daily.    Marland Kitchen HYDROmorphone (DILAUDID) 4 MG tablet Take 1 tablet (4 mg total) by mouth every 4 (four) hours as needed for severe pain. 30 tablet 0  . lidocaine-prilocaine (EMLA) cream Apply 1 application topically as needed. 30 g 6  . methocarbamol (ROBAXIN) 500 MG tablet Take 1 tablet (500 mg total) by mouth every 8 (eight) hours as needed for muscle spasms. 30 tablet 0  . omeprazole (PRILOSEC) 20 MG capsule TAKE 1 CAPSULE BY MOUTH EVERY DAY 90 capsule 0  . ondansetron (ZOFRAN) 8 MG tablet Take 1 tablet (8 mg total) by mouth every 8 (eight) hours as needed for nausea. 30 tablet 3  . oxybutynin (DITROPAN-XL) 5 MG 24 hr tablet TAKE 1 TABLET BY MOUTH EVERYDAY AT BEDTIME 30 tablet 3  . prochlorperazine (COMPAZINE) 10 MG tablet Take 1 tablet (10 mg total) by mouth every 6 (six) hours as needed for nausea or vomiting. 30 tablet 1  . temazepam (RESTORIL) 15 MG capsule Take 15 mg by mouth at bedtime as needed for sleep.     No current facility-administered medications for this visit.    Facility-Administered Medications Ordered in Other Visits  Medication Dose Route Frequency Provider Last Rate Last Dose  . heparin lock flush 100 unit/mL  500 Units Intravenous Once Livesay, Lennis P, MD      . sodium chloride flush (NS) 0.9 % injection 10 mL  10 mL Intravenous PRN Livesay, Lennis P, MD      . sodium chloride flush (NS) 0.9 % injection 10 mL  10 mL Intracatheter PRN Alvy Bimler, Kiyoko Mcguirt, MD   10 mL at 03/29/17 1658    PHYSICAL EXAMINATION: ECOG PERFORMANCE STATUS: 2 - Symptomatic, <50% confined to bed  Vitals:   09/09/17 1435  BP: 110/68  Pulse: 87  Resp: 18  Temp: 98.5 F (36.9 C)  SpO2: 99%   Filed Weights   09/09/17 1435  Weight: 157 lb 12.8 oz (71.6 kg)    GENERAL:alert, no distress and  comfortable.  She looks thin SKIN: skin color, texture, turgor are normal, no rashes or significant lesions EYES: normal, Conjunctiva are pink and non-injected, sclera clear OROPHARYNX:no exudate, no erythema and lips, buccal mucosa, and tongue normal  NECK: supple, thyroid normal size, non-tender, without nodularity LYMPH:  no palpable lymphadenopathy in the cervical, axillary or inguinal LUNGS: clear to auscultation and percussion with normal breathing effort HEART: regular rate & rhythm and no murmurs and no lower extremity edema ABDOMEN:abdomen soft, non-tender and normal bowel sounds Musculoskeletal:no cyanosis  of digits and no clubbing  NEURO: alert & oriented x 3 with fluent speech, no focal motor/sensory deficits  LABORATORY DATA:  I have reviewed the data as listed    Component Value Date/Time   NA 140 09/09/2017 1408   NA 142 12/12/2016 0745   K 4.5 09/09/2017 1408   K 4.5 12/12/2016 0745   CL 105 09/09/2017 1408   CO2 28 09/09/2017 1408   CO2 24 12/12/2016 0745   GLUCOSE 101 (H) 09/09/2017 1408   GLUCOSE 93 12/12/2016 0745   BUN 20 09/09/2017 1408   BUN 13.8 12/12/2016 0745   CREATININE 1.28 (H) 09/09/2017 1408   CREATININE 1.1 12/12/2016 0745   CALCIUM 9.6 09/09/2017 1408   CALCIUM 9.3 12/12/2016 0745   PROT 7.8 09/09/2017 1408   PROT 6.9 12/12/2016 0745   ALBUMIN 3.4 (L) 09/09/2017 1408   ALBUMIN 3.6 12/12/2016 0745   AST 21 09/09/2017 1408   AST 15 12/12/2016 0745   ALT 19 09/09/2017 1408   ALT 10 12/12/2016 0745   ALKPHOS 236 (H) 09/09/2017 1408   ALKPHOS 97 12/12/2016 0745   BILITOT 0.4 09/09/2017 1408   BILITOT 0.34 12/12/2016 0745   GFRNONAA 41 (L) 09/09/2017 1408   GFRAA 48 (L) 09/09/2017 1408    No results found for: SPEP, UPEP  Lab Results  Component Value Date   WBC 9.4 09/09/2017   NEUTROABS 6.7 (H) 09/09/2017   HGB 10.9 (L) 09/09/2017   HCT 33.5 (L) 09/09/2017   MCV 87.0 09/09/2017   PLT 223 09/09/2017      Chemistry      Component  Value Date/Time   NA 140 09/09/2017 1408   NA 142 12/12/2016 0745   K 4.5 09/09/2017 1408   K 4.5 12/12/2016 0745   CL 105 09/09/2017 1408   CO2 28 09/09/2017 1408   CO2 24 12/12/2016 0745   BUN 20 09/09/2017 1408   BUN 13.8 12/12/2016 0745   CREATININE 1.28 (H) 09/09/2017 1408   CREATININE 1.1 12/12/2016 0745      Component Value Date/Time   CALCIUM 9.6 09/09/2017 1408   CALCIUM 9.3 12/12/2016 0745   ALKPHOS 236 (H) 09/09/2017 1408   ALKPHOS 97 12/12/2016 0745   AST 21 09/09/2017 1408   AST 15 12/12/2016 0745   ALT 19 09/09/2017 1408   ALT 10 12/12/2016 0745   BILITOT 0.4 09/09/2017 1408   BILITOT 0.34 12/12/2016 0745       All questions were answered. The patient knows to call the clinic with any problems, questions or concerns. No barriers to learning was detected.  I spent 25 minutes counseling the patient face to face. The total time spent in the appointment was 40 minutes and more than 50% was on counseling and review of test results  Heath Lark, MD 09/09/2017 3:55 PM

## 2017-09-09 NOTE — Telephone Encounter (Signed)
Gave avs and calendar ° °

## 2017-09-09 NOTE — Assessment & Plan Note (Signed)
She has severe bilateral flank pain/back pain likely due to cancer associated pain I recommend avoiding NSAID due to renal failure I recommend low-dose Dilaudid as needed and it is controlling her pain well

## 2017-09-09 NOTE — Assessment & Plan Note (Signed)
She has intermittent vaginal discharge due to cancer I recommend repeat CT for objective assessment and she agree with the plan of care

## 2017-09-09 NOTE — Assessment & Plan Note (Signed)
She complained of excessive fatigue which I suspect is due to cancer progression She is mildly anemic

## 2017-09-13 ENCOUNTER — Ambulatory Visit (HOSPITAL_COMMUNITY)
Admission: RE | Admit: 2017-09-13 | Discharge: 2017-09-13 | Disposition: A | Discharge status: Home / Self Care | Payer: Medicare HMO | Source: Ambulatory Visit | Attending: Hematology and Oncology | Admitting: Hematology and Oncology

## 2017-09-13 DIAGNOSIS — R590 Localized enlarged lymph nodes: Secondary | ICD-10-CM | POA: Insufficient documentation

## 2017-09-13 DIAGNOSIS — C7802 Secondary malignant neoplasm of left lung: Secondary | ICD-10-CM | POA: Diagnosis not present

## 2017-09-13 DIAGNOSIS — R9389 Abnormal findings on diagnostic imaging of other specified body structures: Secondary | ICD-10-CM | POA: Insufficient documentation

## 2017-09-13 DIAGNOSIS — C539 Malignant neoplasm of cervix uteri, unspecified: Secondary | ICD-10-CM | POA: Insufficient documentation

## 2017-09-13 DIAGNOSIS — C7800 Secondary malignant neoplasm of unspecified lung: Secondary | ICD-10-CM | POA: Diagnosis not present

## 2017-09-13 DIAGNOSIS — C787 Secondary malignant neoplasm of liver and intrahepatic bile duct: Secondary | ICD-10-CM

## 2017-09-13 DIAGNOSIS — C760 Malignant neoplasm of head, face and neck: Secondary | ICD-10-CM | POA: Diagnosis not present

## 2017-09-17 ENCOUNTER — Inpatient Hospital Stay (HOSPITAL_BASED_OUTPATIENT_CLINIC_OR_DEPARTMENT_OTHER): Payer: Medicare HMO | Admitting: Hematology and Oncology

## 2017-09-17 ENCOUNTER — Encounter: Payer: Self-pay | Admitting: Radiation Oncology

## 2017-09-17 ENCOUNTER — Encounter: Payer: Self-pay | Admitting: Hematology and Oncology

## 2017-09-17 ENCOUNTER — Encounter: Payer: Self-pay | Admitting: Oncology

## 2017-09-17 VITALS — BP 116/56 | HR 85 | Temp 98.3°F | Resp 18 | Ht 64.5 in | Wt 156.3 lb

## 2017-09-17 DIAGNOSIS — Z9221 Personal history of antineoplastic chemotherapy: Secondary | ICD-10-CM | POA: Diagnosis not present

## 2017-09-17 DIAGNOSIS — C7801 Secondary malignant neoplasm of right lung: Secondary | ICD-10-CM | POA: Diagnosis not present

## 2017-09-17 DIAGNOSIS — C7802 Secondary malignant neoplasm of left lung: Secondary | ICD-10-CM | POA: Diagnosis not present

## 2017-09-17 DIAGNOSIS — C786 Secondary malignant neoplasm of retroperitoneum and peritoneum: Secondary | ICD-10-CM | POA: Diagnosis not present

## 2017-09-17 DIAGNOSIS — Z79899 Other long term (current) drug therapy: Secondary | ICD-10-CM | POA: Diagnosis not present

## 2017-09-17 DIAGNOSIS — G893 Neoplasm related pain (acute) (chronic): Secondary | ICD-10-CM | POA: Diagnosis not present

## 2017-09-17 DIAGNOSIS — N184 Chronic kidney disease, stage 4 (severe): Secondary | ICD-10-CM

## 2017-09-17 DIAGNOSIS — T451X5A Adverse effect of antineoplastic and immunosuppressive drugs, initial encounter: Secondary | ICD-10-CM

## 2017-09-17 DIAGNOSIS — C787 Secondary malignant neoplasm of liver and intrahepatic bile duct: Secondary | ICD-10-CM

## 2017-09-17 DIAGNOSIS — Z7189 Other specified counseling: Secondary | ICD-10-CM

## 2017-09-17 DIAGNOSIS — C539 Malignant neoplasm of cervix uteri, unspecified: Secondary | ICD-10-CM

## 2017-09-17 DIAGNOSIS — C781 Secondary malignant neoplasm of mediastinum: Secondary | ICD-10-CM

## 2017-09-17 DIAGNOSIS — G620 Drug-induced polyneuropathy: Secondary | ICD-10-CM

## 2017-09-17 NOTE — Assessment & Plan Note (Signed)
She has mild residual peripheral neuropathy from prior treatment I recommend weekly Taxol x2 weeks and then off 1 week

## 2017-09-17 NOTE — Progress Notes (Signed)
[facility] OFFICE PROGRESS NOTE  Patient Care Team: [name], MD as PCP - General (Family Medicine) Carmel Sacramento, OD as Consulting Physician (Optometry)  ASSESSMENT & PLAN:  Cervical cancer [facility]) I have reveal multiple imaging studies with the patient's Overall, CT imaging show significant disease progression She is also symptomatic with significant necrotic vaginal discharge I recommend palliative radiation to the pelvis for necrotic vaginal discharge We had extensive discussion in the past about the risk, benefits, side effects of chemotherapy Due to increased risk of pancytopenia and recurrent infection, I do not believe she can tolerate combination treatment We discussed the risk, benefits, side effects of Taxol versus carboplatin and ultimately, she has decided to proceed with single agent Taxol for palliative chemotherapy I will wait until completion of radiation treatment before we pursue Taxol treatment I will coordinate care with GYN oncologist navigator She is referred to see radiation oncologist in 2 days time   Cancer associated pain She has severe bilateral flank pain/back pain likely due to cancer associated pain I recommend avoiding NSAID due to renal failure I recommend low-dose Dilaudid as needed and it is controlling her pain well  Chronic kidney disease (CKD), stage IV (severe) ([place]) She has intermittent acute on chronic renal failure I reinforced importance of hydration  Malignant neoplasm metastatic to left lung [facility]) She has intermittent cough but not significant We will observe for now  Chemotherapy-induced peripheral neuropathy ([place]) She has mild residual peripheral neuropathy from prior treatment I recommend weekly Taxol x2 weeks and then off 1 week  Goals of care, counseling/discussion We had multiple, extensive goals of care discussion in the past She has verbalized her desire to be DNR in the future but wants full code in  the event of treatable disease such as infection    No orders of the defined types were placed in this encounter.   INTERVAL HISTORY: Please see below for problem oriented charting. She returns for further follow-up She has noticed significant amount of vaginal discharge She denies difficulty with urinary retention, dysuria or hematuria Her appetite is poor and she has lost some weight She has mild nonproductive cough Her flank pain is stable with prescription pain medicine She denies constipation She complained of excessive fatigue She denies significant peripheral neuropathy from prior treatment  SUMMARY OF ONCOLOGIC HISTORY: Oncology History   PD-L 1 testing neg     Metastatic cancer ([place])   04/26/2015 Initial Diagnosis    Metastatic cancer ([place])      09/17/2017 -  Chemotherapy    The patient had PACLitaxel (TAXOL) 144 mg in sodium chloride 0.9 % 250 mL chemo infusion (</= 80mg /m2), 80 mg/m2, Intravenous,  Once, 0 of 12 cycles  for chemotherapy treatment.        Malignant neoplasm metastatic to right lung ([place])   05/06/2015 Initial Diagnosis    Malignant neoplasm metastatic to right lung ([place])      09/17/2017 -  Chemotherapy    The patient had PACLitaxel (TAXOL) 144 mg in sodium chloride 0.9 % 250 mL chemo infusion (</= 80mg /m2), 80 mg/m2, Intravenous,  Once, 0 of 12 cycles  for chemotherapy treatment.        Malignant neoplasm metastatic to left lung ([place])   05/06/2015 Initial Diagnosis    Malignant neoplasm metastatic to left lung ([place])      09/17/2017 -  Chemotherapy    The patient had PACLitaxel (TAXOL) 144 mg in sodium chloride 0.9 % 250 mL chemo infusion (</= 80mg /m2),  80 mg/m2, Intravenous,  Once, 0 of 12 cycles  for chemotherapy treatment.        Cervical cancer ([place])   04/26/2015 Imaging    Numerous bilateral pulmonary nodules consistent with metastatic disease are associated with liver lesions, omental nodules, mesenteric nodules, necrotic  retroperitoneal lymphadenopathy in a mixed cystic and solid mass in the central pelvis involving the uterus extending into the cul-de-sac and both adnexal regions. Uterine primary (likely endometrial) is favored over an ovarian primary malignancy given the lack of ascites, associated intrahepatic metastases and pulmonary involvement.      05/04/2015 Procedure    Technically successful CT-guided core biopsy of left pelvic mass      05/04/2015 Pathology Results    Soft Tissue Needle Core Biopsy, left ext iliac mass METASTATIC HIGH GRADE POORLY DIFFERENTIATED CARCINOMA CONSISTENT WITH CERVICAL ORIGIN Microscopic Comment The neoplasm stains positive for cervical markers : p16, ER, ck8/18, and p63 (focal), TTF-1 (focal) and negative for synaptophysin, Chromogranin (neuroendocrine markers) and ck5/6. The morphology and immunohistochemistry staining pattern support these cells are GYN cervical origin.Spec      05/10/2015 Imaging    Innumerable (> than 40) pulmonary nodules randomly distributed throughout both lungs, largest 1.1 cm, in keeping with pulmonary metastases. 2. Left supraclavicular and left hilar nodal metastases. 3. Re- demonstration of liver metastases. 4. Right thyroid lobe 1.4 cm pulmonary nodule, for which no further evaluation is recommended. 5. Stable mild superior T11 vertebral compression deformity.      05/12/2015 - 05/12/2015 Chemotherapy    She received 1 dose of carbo/taxol only      05/15/2015 Imaging    Widely metastatic cervical carcinoma with unchanged appearance compared to 04/26/2015. Bulky tumor narrows and likely invades the rectum/distal sigmoid with progressive proximal stool retention      05/15/2015 - 05/23/2015 Hospital Admission    She was admitted for severe bowel obstruction requiring urgent diversion colostomy      05/18/2015 Surgery    She underwent LAPAROSCOPIC DIVERTING SIGMOID LOOP COLOSTOMY for bowel obstruction       06/10/2015 Procedure    Successful  placement of a right internal jugular approach power injectable Port-A-Cath. The catheter is ready for immediate use      06/23/2015 - 03/01/2016 Chemotherapy    She received combination treatment with cisplatin, Taxol and Avastin. Avastin was started on 5/11. Dose of Taxol was modified due to neuropathy and cisplatin was omited last dose due to 'weakness'      09/13/2015 Imaging    Significant interval treatment response. Pulmonary and liver metastases have significantly decreased in size. Thoracic adenopathy has decreased in size. Mixed attenuation mass encompassing the uterus and bilateral adnexal regions has significantly decreased in size. No residual visualized peritoneal tumor implants. No new or progressive metastatic disease. 2. No evidence of bowel obstruction or acute bowel inflammation status post diverting sigmoid colostomy. 3. Additional findings include mild aortic atherosclerosis and Stable mild chronic superior T12 compression fracture.      12/14/2015 Imaging    Stable to improved interval exam. Multiple bilateral pulmonary nodules are stable in size to decreased and the liver lesions are stable to decreased. Mixed attenuation lesion encompassing the uterus and adnexal region shows decrease in size. There is no evidence for new or progressive disease on today's exam.      05/15/2016 Imaging    Findings today reflecting mixed interval response to therapy. 2. There has been mild increase in size of pulmonary nodules. 3. Interval increase in size of pelvic  mass 4. Further regression of liver metastases 5. There is a new soft tissue nodule within the right lower quadrant peritoneal cavity. Suspicious peritoneal disease      06/07/2016 - 06/06/2017 Chemotherapy    She received chemo with cisplatin and gemzar      06/14/2016 Adverse Reaction    Cycle 1, day 8 is put on hold due to severe pancytopenia      08/29/2016 Imaging    Ct scan of chest, abdomen and pelvis: 1. General  improvement, with reduced size of the scattered pulmonary nodules, and also a much more normal contour and appearance of the uterus, without the visible enhancing irregular mass shown on the 05/15/2016 exam involving the uterus and cervix. There still some mild soft tissue prominence of the cervix, and a cystic lesion of the left ovary as noted above. No pathologic adenopathy. 2. The right lower quadrant omental nodule has considerably improved and nearly resolved. 3. The prior hepatic metastatic lesions have essentially resolved, with only minimal residual linear hypodensity in segment 4 at the site of the prior large metastatic lesion shown on 05/15/2015.  4. Other imaging findings of potential clinical significance: Left lower quadrant colostomy. Chronic superior endplate compression at T11. Bony demineralization. Trace free pelvic fluid in the cul-de-sac. Central disc protrusion at L3-4. Left mid kidney hypodense lesion is likely a cyst.      12/12/2016 Imaging    1. Suspected minimal enlargement of at least 1 pulmonary nodule. The majority of pulmonary nodules are similar. Cannot exclude minimal pulmonary metastasis progression. 2. No evidence of extrathoracic progressive metastatic disease. Similar soft tissue fullness within the uterine cervix and similar to slight decrease in size of a tiny omental nodule. 3. Aortic Atherosclerosis (ICD10-I70.0). 4. Chronic T10 compression deformity      03/13/2017 Imaging    1. Interval progression of pre-existing pulmonary metastases with interval development of new pulmonary metastatic lesions. 2. Interval development of new and progression of pre-existing omental metastases. 3. Marked interval progression of cervical disease with dilated fluid-filled endometrial canal in the uterine fundus. The cervical disease infiltrates into the lower uterine segment and likely extends into the adnexal space bilaterally. Posterior extent of cervical disease is tethered  to the distal sigmoid colon in this patient with a sigmoid loop colostomy. 4. Stable atrophy of the medial segment left liver.      03/22/2017 - 06/06/2017 Chemotherapy    The patient had chemo cisplatin and gemzar       04/26/2017 Imaging    1. Bladder wall irregularity anteriorly, suspicious for cystitis. No specific evidence of fistula to uterus or vagina. Voiding cystogram would likely be of greatest sensitivity. 2. Similar to slight decrease in size of infiltrative mass centered about the lower uterine segment. Presumed sigmoid colonic wall involvement.      07/09/2017 Imaging    Mild interval progression of diffuse bilateral pulmonary metastases since prior study.  Stable soft tissue mass involving the cervix, with hydrometros. Stable bilateral adnexal soft tissue densities likely due to metastatic disease. No new or progressive metastatic disease identified within the abdomen or pelvis.  Mild benign-appearing compression fractures of T12 and L5 vertebral bodies, new since 03/13/2017 exam.      09/09/2017 Cancer Staging    Staging form: Cervix Uteri, AJCC 8th Edition - Clinical: Stage IVB (cTX, cN1, pM1) - Signed by Heath Lark, MD on 09/09/2017      09/13/2017 Imaging    1. Continued progression of pulmonary metastases with enlargement of previous  nodules and development of new nodules. 2. New liver lesions measuring up to 16 mm, consistent with metastases. 3. New mediastinal lymphadenopathy consistent with metastatic disease. 4. New retroperitoneal lymphadenopathy in the abdomen. 5. Interval progression of abnormal soft tissue in the region of the cervix and in both adnexal regions. 6. No evidence of bowel obstruction or urinary obstruction.      09/17/2017 -  Chemotherapy    The patient had PACLitaxel (TAXOL) 144 mg in sodium chloride 0.9 % 250 mL chemo infusion (</= 80mg /m2), 80 mg/m2, Intravenous,  Once, 0 of 12 cycles  for chemotherapy treatment.        REVIEW OF SYSTEMS:    Constitutional: Denies fevers, chills  Eyes: Denies blurriness of vision Ears, nose, mouth, throat, and face: Denies mucositis or sore throat Cardiovascular: Denies palpitation, chest discomfort or lower extremity swelling Gastrointestinal:  Denies nausea, heartburn or change in bowel habits Skin: Denies abnormal skin rashes Lymphatics: Denies new lymphadenopathy or easy bruising Neurological:Denies numbness, tingling or new weaknesses Behavioral/Psych: Mood is stable, no new changes  All other systems were reviewed with the patient and are negative.  I have reviewed the past medical history, past surgical history, social history and family history with the patient and they are unchanged from previous note.  ALLERGIES:  has No Known Allergies.  MEDICATIONS:  Current Outpatient Medications  Medication Sig Dispense Refill  . cholecalciferol (VITAMIN D) 1000 units tablet Take 1,000 Units by mouth daily.    Marland Kitchen HYDROmorphone (DILAUDID) 4 MG tablet Take 1 tablet (4 mg total) by mouth every 4 (four) hours as needed for severe pain. 30 tablet 0  . lidocaine-prilocaine (EMLA) cream Apply 1 application topically as needed. 30 g 6  . methocarbamol (ROBAXIN) 500 MG tablet Take 1 tablet (500 mg total) by mouth every 8 (eight) hours as needed for muscle spasms. 30 tablet 0  . omeprazole (PRILOSEC) 20 MG capsule TAKE 1 CAPSULE BY MOUTH EVERY DAY 90 capsule 0  . ondansetron (ZOFRAN) 8 MG tablet Take 1 tablet (8 mg total) by mouth every 8 (eight) hours as needed for nausea. 30 tablet 3  . oxybutynin (DITROPAN-XL) 5 MG 24 hr tablet TAKE 1 TABLET BY MOUTH EVERYDAY AT BEDTIME 30 tablet 3  . prochlorperazine (COMPAZINE) 10 MG tablet Take 1 tablet (10 mg total) by mouth every 6 (six) hours as needed for nausea or vomiting. 30 tablet 1  . temazepam (RESTORIL) 15 MG capsule Take 15 mg by mouth at bedtime as needed for sleep.     No current facility-administered medications for this visit.     Facility-Administered Medications Ordered in Other Visits  Medication Dose Route Frequency Provider Last Rate Last Dose  . heparin lock flush 100 unit/mL  500 Units Intravenous Once Livesay, Lennis P, MD      . sodium chloride flush (NS) 0.9 % injection 10 mL  10 mL Intravenous PRN Livesay, Lennis P, MD      . sodium chloride flush (NS) 0.9 % injection 10 mL  10 mL Intracatheter PRN Alvy Bimler, Thane Age, MD   10 mL at 03/29/17 1658    PHYSICAL EXAMINATION: ECOG PERFORMANCE STATUS: 2 - Symptomatic, <50% confined to bed  Vitals:   09/17/17 1231  BP: (!) 116/56  Pulse: 85  Resp: 18  Temp: 98.3 F (36.8 C)  SpO2: 97%   Filed Weights   09/17/17 1231  Weight: 156 lb 4.8 oz (70.9 kg)    GENERAL:alert, no distress and comfortable SKIN: skin color, texture, turgor  are normal, no rashes or significant lesions EYES: normal, Conjunctiva are pink and non-injected, sclera clear OROPHARYNX:no exudate, no erythema and lips, buccal mucosa, and tongue normal  NECK: supple, thyroid normal size, non-tender, without nodularity LYMPH:  no palpable lymphadenopathy in the cervical, axillary or inguinal LUNGS: clear to auscultation and percussion with normal breathing effort HEART: regular rate & rhythm and no murmurs and no lower extremity edema ABDOMEN:abdomen soft, non-tender and normal bowel sounds Musculoskeletal:no cyanosis of digits and no clubbing  NEURO: alert & oriented x 3 with fluent speech, no focal motor/sensory deficits  LABORATORY DATA:  I have reviewed the data as listed    Component Value Date/Time   NA 140 09/09/2017 1408   NA 142 12/12/2016 0745   K 4.5 09/09/2017 1408   K 4.5 12/12/2016 0745   CL 105 09/09/2017 1408   CO2 28 09/09/2017 1408   CO2 24 12/12/2016 0745   GLUCOSE 101 (H) 09/09/2017 1408   GLUCOSE 93 12/12/2016 0745   BUN 20 09/09/2017 1408   BUN 13.8 12/12/2016 0745   CREATININE 1.28 (H) 09/09/2017 1408   CREATININE 1.1 12/12/2016 0745   CALCIUM 9.6 09/09/2017  1408   CALCIUM 9.3 12/12/2016 0745   PROT 7.8 09/09/2017 1408   PROT 6.9 12/12/2016 0745   ALBUMIN 3.4 (L) 09/09/2017 1408   ALBUMIN 3.6 12/12/2016 0745   AST 21 09/09/2017 1408   AST 15 12/12/2016 0745   ALT 19 09/09/2017 1408   ALT 10 12/12/2016 0745   ALKPHOS 236 (H) 09/09/2017 1408   ALKPHOS 97 12/12/2016 0745   BILITOT 0.4 09/09/2017 1408   BILITOT 0.34 12/12/2016 0745   GFRNONAA 41 (L) 09/09/2017 1408   GFRAA 48 (L) 09/09/2017 1408    No results found for: SPEP, UPEP  Lab Results  Component Value Date   WBC 9.4 09/09/2017   NEUTROABS 6.7 (H) 09/09/2017   HGB 10.9 (L) 09/09/2017   HCT 33.5 (L) 09/09/2017   MCV 87.0 09/09/2017   PLT 223 09/09/2017      Chemistry      Component Value Date/Time   NA 140 09/09/2017 1408   NA 142 12/12/2016 0745   K 4.5 09/09/2017 1408   K 4.5 12/12/2016 0745   CL 105 09/09/2017 1408   CO2 28 09/09/2017 1408   CO2 24 12/12/2016 0745   BUN 20 09/09/2017 1408   BUN 13.8 12/12/2016 0745   CREATININE 1.28 (H) 09/09/2017 1408   CREATININE 1.1 12/12/2016 0745      Component Value Date/Time   CALCIUM 9.6 09/09/2017 1408   CALCIUM 9.3 12/12/2016 0745   ALKPHOS 236 (H) 09/09/2017 1408   ALKPHOS 97 12/12/2016 0745   AST 21 09/09/2017 1408   AST 15 12/12/2016 0745   ALT 19 09/09/2017 1408   ALT 10 12/12/2016 0745   BILITOT 0.4 09/09/2017 1408   BILITOT 0.34 12/12/2016 0745       RADIOGRAPHIC STUDIES: I have reviewed multiple imaging study with the patient I have personally reviewed the radiological images as listed and agreed with the findings in the report. Ct Abdomen Pelvis Wo Contrast  Result Date: 09/13/2017 CLINICAL DATA:  Metastatic cervical cancer with lung and liver metastases. EXAM: CT CHEST, ABDOMEN AND PELVIS WITHOUT CONTRAST TECHNIQUE: Multidetector CT imaging of the chest, abdomen and pelvis was performed following the standard protocol without IV contrast. COMPARISON:  07/09/2017 FINDINGS: CT CHEST FINDINGS  Cardiovascular: The heart size is normal. No substantial pericardial effusion. Atherosclerotic calcification is noted in the  wall of the thoracic aorta. Right Port-A-Cath tip is positioned in the mid to distal SVC. Mediastinum/Nodes: Interval progression of precarinal lymph node now measuring 11 mm in short axis compared to 5 mm previously. 8 mm short axis subcarinal lymph node was about 3 mm previously. No evidence for gross hilar lymphadenopathy although assessment is limited by the lack of intravenous contrast on today's study. The esophagus has normal imaging features. There is no axillary lymphadenopathy. Lungs/Pleura: The central tracheobronchial airways are patent. Innumerable bilateral pulmonary nodules are evident, progressive in the interval. 13 mm right lower lobe nodule, just posterior to fissure (4:77) was 8 mm previously. 10 mm left lower lobe pulmonary nodule (4:108) was 8 mm previously. 14 mm right lower lobe pulmonary nodule (4:113) was 8 mm previously. New pulmonary nodules are evident bilaterally. Musculoskeletal: Sclerotic lesion in the left scapula (4:25) is stable. CT ABDOMEN PELVIS FINDINGS Hepatobiliary: Stable atrophy in the medial segment left liver. Biliary dilatation in the lateral segment left liver is progressive in the interval. New hypoattenuating liver lesions are identified in the right hepatic lobe. Index 16 mm lesion (2:60) is new in the interval. 12 mm subcapsular right liver lesion (2:59) is also new. There is no evidence for gallstones, gallbladder wall thickening, or pericholecystic fluid. No intrahepatic or extrahepatic biliary dilation. Pancreas: No focal mass lesion. No dilatation of the main duct. No intraparenchymal cyst. No peripancreatic edema. Spleen: No splenomegaly. No focal mass lesion. Adrenals/Urinary Tract: No adrenal nodule or mass. Kidneys unremarkable. No evidence for hydroureter. Bladder decompressed. Stomach/Bowel: Stomach is nondistended. No gastric wall  thickening. No evidence of outlet obstruction. Duodenum is normally positioned as is the ligament of Treitz. No small bowel wall thickening. No small bowel dilatation. The terminal ileum is normal. The appendix is not visualized, but there is no edema or inflammation in the region of the cecum. Left lower quadrant loop colostomy evident. Vascular/Lymphatic: No abdominal aortic aneurysm. Progression of retroperitoneal lymphadenopathy evident. 18 mm short axis aortocaval lymph node is seen on series 2, image 79. New 8 mm short axis right common iliac lymph node. Reproductive: Soft tissue mass in the region of the cervix, previously measured at 4.3 x 5.3 cm now measures 4.7 x 6.2 cm. Fluid-filled dilated uterine cavity appears slightly progressive in the interval. The right adnexal soft tissue lesion measured previously at 5.5 x 3.2 cm now measures 6.5 x 2.8 cm. Left adnexal soft tissue lesion measured previously at 3.7 x 2.9 cm now measures 3.4 x 4.5 cm. Other: No intraperitoneal free fluid. Musculoskeletal: No worrisome lytic or sclerotic osseous abnormality. Stable appearance of compression deformity at T11, T12, and L5. IMPRESSION: 1. Continued progression of pulmonary metastases with enlargement of previous nodules and development of new nodules. 2. New liver lesions measuring up to 16 mm, consistent with metastases. 3. New mediastinal lymphadenopathy consistent with metastatic disease. 4. New retroperitoneal lymphadenopathy in the abdomen. 5. Interval progression of abnormal soft tissue in the region of the cervix and in both adnexal regions. 6. No evidence of bowel obstruction or urinary obstruction. Electronically Signed   By: Misty Stanley M.D.   On: 09/13/2017 15:13   Ct Chest Wo Contrast  Result Date: 09/13/2017 CLINICAL DATA:  Metastatic cervical cancer with lung and liver metastases. EXAM: CT CHEST, ABDOMEN AND PELVIS WITHOUT CONTRAST TECHNIQUE: Multidetector CT imaging of the chest, abdomen and pelvis  was performed following the standard protocol without IV contrast. COMPARISON:  07/09/2017 FINDINGS: CT CHEST FINDINGS Cardiovascular: The heart size is normal. No  substantial pericardial effusion. Atherosclerotic calcification is noted in the wall of the thoracic aorta. Right Port-A-Cath tip is positioned in the mid to distal SVC. Mediastinum/Nodes: Interval progression of precarinal lymph node now measuring 11 mm in short axis compared to 5 mm previously. 8 mm short axis subcarinal lymph node was about 3 mm previously. No evidence for gross hilar lymphadenopathy although assessment is limited by the lack of intravenous contrast on today's study. The esophagus has normal imaging features. There is no axillary lymphadenopathy. Lungs/Pleura: The central tracheobronchial airways are patent. Innumerable bilateral pulmonary nodules are evident, progressive in the interval. 13 mm right lower lobe nodule, just posterior to fissure (4:77) was 8 mm previously. 10 mm left lower lobe pulmonary nodule (4:108) was 8 mm previously. 14 mm right lower lobe pulmonary nodule (4:113) was 8 mm previously. New pulmonary nodules are evident bilaterally. Musculoskeletal: Sclerotic lesion in the left scapula (4:25) is stable. CT ABDOMEN PELVIS FINDINGS Hepatobiliary: Stable atrophy in the medial segment left liver. Biliary dilatation in the lateral segment left liver is progressive in the interval. New hypoattenuating liver lesions are identified in the right hepatic lobe. Index 16 mm lesion (2:60) is new in the interval. 12 mm subcapsular right liver lesion (2:59) is also new. There is no evidence for gallstones, gallbladder wall thickening, or pericholecystic fluid. No intrahepatic or extrahepatic biliary dilation. Pancreas: No focal mass lesion. No dilatation of the main duct. No intraparenchymal cyst. No peripancreatic edema. Spleen: No splenomegaly. No focal mass lesion. Adrenals/Urinary Tract: No adrenal nodule or mass. Kidneys  unremarkable. No evidence for hydroureter. Bladder decompressed. Stomach/Bowel: Stomach is nondistended. No gastric wall thickening. No evidence of outlet obstruction. Duodenum is normally positioned as is the ligament of Treitz. No small bowel wall thickening. No small bowel dilatation. The terminal ileum is normal. The appendix is not visualized, but there is no edema or inflammation in the region of the cecum. Left lower quadrant loop colostomy evident. Vascular/Lymphatic: No abdominal aortic aneurysm. Progression of retroperitoneal lymphadenopathy evident. 18 mm short axis aortocaval lymph node is seen on series 2, image 79. New 8 mm short axis right common iliac lymph node. Reproductive: Soft tissue mass in the region of the cervix, previously measured at 4.3 x 5.3 cm now measures 4.7 x 6.2 cm. Fluid-filled dilated uterine cavity appears slightly progressive in the interval. The right adnexal soft tissue lesion measured previously at 5.5 x 3.2 cm now measures 6.5 x 2.8 cm. Left adnexal soft tissue lesion measured previously at 3.7 x 2.9 cm now measures 3.4 x 4.5 cm. Other: No intraperitoneal free fluid. Musculoskeletal: No worrisome lytic or sclerotic osseous abnormality. Stable appearance of compression deformity at T11, T12, and L5. IMPRESSION: 1. Continued progression of pulmonary metastases with enlargement of previous nodules and development of new nodules. 2. New liver lesions measuring up to 16 mm, consistent with metastases. 3. New mediastinal lymphadenopathy consistent with metastatic disease. 4. New retroperitoneal lymphadenopathy in the abdomen. 5. Interval progression of abnormal soft tissue in the region of the cervix and in both adnexal regions. 6. No evidence of bowel obstruction or urinary obstruction. Electronically Signed   By: Misty Stanley M.D.   On: 09/13/2017 15:13    All questions were answered. The patient knows to call the clinic with any problems, questions or concerns. No barriers  to learning was detected.  I spent 30 minutes counseling the patient face to face. The total time spent in the appointment was 40 minutes and more than 50% was on  counseling and review of test results  Heath Lark, MD 09/17/2017 3:46 PM

## 2017-09-17 NOTE — Assessment & Plan Note (Signed)
She has intermittent cough but not significant We will observe for now

## 2017-09-17 NOTE — Assessment & Plan Note (Signed)
She has intermittent acute on chronic renal failure I reinforced importance of hydration

## 2017-09-17 NOTE — Assessment & Plan Note (Signed)
We had multiple, extensive goals of care discussion in the past She has verbalized her desire to be DNR in the future but wants full code in the event of treatable disease such as infection

## 2017-09-17 NOTE — Assessment & Plan Note (Signed)
I have reveal multiple imaging studies with the patient's Overall, CT imaging show significant disease progression She is also symptomatic with significant necrotic vaginal discharge I recommend palliative radiation to the pelvis for necrotic vaginal discharge We had extensive discussion in the past about the risk, benefits, side effects of chemotherapy Due to increased risk of pancytopenia and recurrent infection, I do not believe she can tolerate combination treatment We discussed the risk, benefits, side effects of Taxol versus carboplatin and ultimately, she has decided to proceed with single agent Taxol for palliative chemotherapy I will wait until completion of radiation treatment before we pursue Taxol treatment I will coordinate care with GYN oncologist navigator She is referred to see radiation oncologist in 2 days time

## 2017-09-17 NOTE — Assessment & Plan Note (Signed)
She has severe bilateral flank pain/back pain likely due to cancer associated pain I recommend avoiding NSAID due to renal failure I recommend low-dose Dilaudid as needed and it is controlling her pain well

## 2017-09-17 NOTE — Progress Notes (Signed)
DISCONTINUE OFF PATHWAY REGIMEN - [Other Dx]   OFF01005:Gemcitabine + Cisplatin every 21 days:   A cycle is every 21 days:     Gemcitabine      Cisplatin   **Always confirm dose/schedule in your pharmacy ordering system**  REASON: Disease Progression PRIOR TREATMENT: Gemcitabine + Cisplatin every 21 days TREATMENT RESPONSE: Progressive Disease (PD)  START OFF PATHWAY REGIMEN - [Other Dx]   OFF00010:Paclitaxel 80 mg/m2 Weekly:   Administer weekly:     Paclitaxel   **Always confirm dose/schedule in your pharmacy ordering system**  Administration Notes: stage 4 recurrent cervical cancer planned 2 weeks treatment 1 week off  Patient Characteristics: Intent of Therapy: Non-Curative / Palliative Intent, Discussed with Patient

## 2017-09-18 NOTE — Progress Notes (Signed)
GYN Location of Tumor / Histology: METASTATIC HIGH GRADE POORLY DIFFERENTIATED CARCINOMA CONSISTENT WITH CERVICAL ORIGIN  Katherine Boone presented with symptoms of: significant necrotic vaginal discharge Per Dr. Alvy Bimler 09/17/17: Overall, CT imaging show significant disease progression She is also symptomatic with significant necrotic vaginal discharge I recommend palliative radiation to the pelvis for necrotic vaginal discharge     Past/Anticipated interventions by Gyn/Onc surgery, if any: None  Past/Anticipated interventions by medical oncology, if any: Per Dr. Alvy Bimler 09/17/17:  Cervical cancer ([place]) I have reveal multiple imaging studies with the patient's Overall, CT imaging show significant disease progression She is also symptomatic with significant necrotic vaginal discharge I recommend palliative radiation to the pelvis for necrotic vaginal discharge We had extensive discussion in the past about the risk, benefits, side effects of chemotherapy Due to increased risk of pancytopenia and recurrent infection, I do not believe she can tolerate combination treatment We discussed the risk, benefits, side effects of Taxol versus carboplatin and ultimately, she has decided to proceed with single agent Taxol for palliative chemotherapy I will wait until completion of radiation treatment before we pursue Taxol treatment I will coordinate care with GYN oncologist navigator She is referred to see radiation oncologist in 2 days time    Weight changes, if any: pt reports 8-10 lb weight loss  Bowel/Bladder complaints, if any: Pt denies dysuria. Pt reports past occurrence of hematuria on UA. Pt reports slight constipation which is relieved by laxatives.  Nausea/Vomiting, if any: Pt denies N/V. Pt reports occasional nausea relieved by eating.  Pain issues, if any:  Pt denies pain.  SAFETY ISSUES:  Prior radiation? No  Pacemaker/ICD? No  Possible current pregnancy? No  Is the patient on  methotrexate? No  Current Complaints / other details:  Pt presents today for initial consult with Dr. Sondra Come and Radiation Oncology. Pt reports vaginal discharge that is white, "slimy and sticky", with occasional blood clots. Pt reports discharge has odor. Pt reports that she feels as though "something is going to fall out" when toileting.  Pt is unaccompanied.

## 2017-09-19 ENCOUNTER — Ambulatory Visit
Admission: RE | Admit: 2017-09-19 | Discharge: 2017-09-19 | Disposition: A | Discharge status: Home / Self Care | Payer: Medicare HMO | Source: Ambulatory Visit | Attending: Radiation Oncology | Admitting: Radiation Oncology

## 2017-09-19 ENCOUNTER — Other Ambulatory Visit: Payer: Self-pay

## 2017-09-19 ENCOUNTER — Encounter: Payer: Self-pay | Admitting: Radiation Oncology

## 2017-09-19 ENCOUNTER — Encounter: Payer: Self-pay | Admitting: Oncology

## 2017-09-19 VITALS — BP 107/71 | HR 99 | Temp 99.0°F | Resp 18 | Ht 64.5 in | Wt 156.8 lb

## 2017-09-19 DIAGNOSIS — Z809 Family history of malignant neoplasm, unspecified: Secondary | ICD-10-CM | POA: Insufficient documentation

## 2017-09-19 DIAGNOSIS — M545 Low back pain: Secondary | ICD-10-CM | POA: Insufficient documentation

## 2017-09-19 DIAGNOSIS — R197 Diarrhea, unspecified: Secondary | ICD-10-CM | POA: Diagnosis not present

## 2017-09-19 DIAGNOSIS — Z933 Colostomy status: Secondary | ICD-10-CM | POA: Diagnosis not present

## 2017-09-19 DIAGNOSIS — C539 Malignant neoplasm of cervix uteri, unspecified: Secondary | ICD-10-CM | POA: Insufficient documentation

## 2017-09-19 DIAGNOSIS — K219 Gastro-esophageal reflux disease without esophagitis: Secondary | ICD-10-CM | POA: Insufficient documentation

## 2017-09-19 DIAGNOSIS — N898 Other specified noninflammatory disorders of vagina: Secondary | ICD-10-CM | POA: Diagnosis not present

## 2017-09-19 DIAGNOSIS — M109 Gout, unspecified: Secondary | ICD-10-CM | POA: Insufficient documentation

## 2017-09-19 DIAGNOSIS — Z510 Encounter for antineoplastic radiation therapy: Secondary | ICD-10-CM | POA: Diagnosis not present

## 2017-09-19 DIAGNOSIS — Z79899 Other long term (current) drug therapy: Secondary | ICD-10-CM | POA: Insufficient documentation

## 2017-09-19 DIAGNOSIS — C775 Secondary and unspecified malignant neoplasm of intrapelvic lymph nodes: Secondary | ICD-10-CM | POA: Diagnosis not present

## 2017-09-19 NOTE — Progress Notes (Signed)
Radiation Oncology         [phone] ________________________________  Initial Outpatient Consultation  Name: Katherine Boone MRN: [id number]  Date: 09/19/2017  DOB: 11-29-1945  [id number], Garlon Hatchet, MD  Heath Lark, MD   REFERRING PHYSICIAN: Heath Lark, MD  DIAGNOSIS: The encounter diagnosis was [employer] (FIGO) stage IVB malignant neoplasm of cervix ([place]).  HISTORY OF PRESENT ILLNESS::Katherine Boone is a 72 y.o. female who ~ 2 years ago was found to have stage IV cervical cancer. she presented recently with significant necrotic vaginal discharge. She was referred by Dr. Alvy Bimler on 09/17/17 for palliative radiation to help with discharge. She initially presented with symptoms of cervical cancer in early 2017. She has since struggled with recurrence and metastases to bilateral lungs and liver. She has received several rounds of chemotherapy since 05/12/15.   Patient notes passing stool despite having a colostomy bag. She reports issues with bowel movements, along with diarrhea, dark discharge, lower back pain. She notes not being able to stand for long periods of time. She denies headaches.  She underwent a biopsy on 05/04/15-- Soft Tissue Needle Core Biopsy, left ext iliac mass-- showing: metastatic high grade poorly differentiated carcinoma consistent with cervical origin.  Her latest CT scans of chest without contrast and abdomen pelvis without contrast on 09/13/17 showed: 1. Continued progression of pulmonary metastases with enlargement of previous nodules and development of new nodules. 2. New liver lesions measuring up to 16 mm, consistent with metastases. 3. New mediastinal lymphadenopathy consistent with metastatic disease. 4. New retroperitoneal lymphadenopathy in the abdomen. 5. Interval progression of abnormal soft tissue in the region of the cervix and in both adnexal regions. 6. No evidence of bowel obstruction or urinary  obstruction.  PREVIOUS RADIATION THERAPY: No  PAST MEDICAL HISTORY:  has a past medical history of Cancer ([place]), Family history of cancer, GERD (gastroesophageal reflux disease), Gout (03/2005), Large bowel obstruction ([place]) (05/28/2015), Migraines, Osteoporosis (02/2011), and Small bowel obstruction ([place]).    PAST SURGICAL HISTORY: Past Surgical History:  Procedure Laterality Date  . APPENDECTOMY  1960  . LAPAROSCOPIC DIVERTED COLOSTOMY N/A 05/18/2015   Procedure: LAPAROSCOPIC DIVERTED LOOP COLOSTOMY ;  Surgeon: Leighton Ruff, MD;  Location: [facility];  Service: General;  Laterality: N/A;    FAMILY HISTORY: family history includes COPD in her brother; Cancer in her brother, brother, father, and other; Cancer (age of onset: 36) in her mother; Heart disease in her mother; Hypertension in her mother and sister; Ovarian cancer in her unknown relative; Stroke in her sister; Throat cancer in her brother.  SOCIAL HISTORY:  reports that she has never smoked. She has never used smokeless tobacco. She reports that she does not drink alcohol or use drugs.  ALLERGIES: Patient has no known allergies.  MEDICATIONS:  Current Outpatient Medications  Medication Sig Dispense Refill  . HYDROmorphone (DILAUDID) 4 MG tablet Take 1 tablet (4 mg total) by mouth every 4 (four) hours as needed for severe pain. 30 tablet 0  . lidocaine-prilocaine (EMLA) cream Apply 1 application topically as needed. 30 g 6  . omeprazole (PRILOSEC) 20 MG capsule TAKE 1 CAPSULE BY MOUTH EVERY DAY 90 capsule 0  . ondansetron (ZOFRAN) 8 MG tablet Take 1 tablet (8 mg total) by mouth every 8 (eight) hours as needed for nausea. 30 tablet 3  . cholecalciferol (VITAMIN D) 1000 units tablet Take 1,000 Units by mouth daily.    . methocarbamol (ROBAXIN) 500 MG tablet Take 1 tablet (500  mg total) by mouth every 8 (eight) hours as needed for muscle spasms. (Patient not taking: Reported on 09/19/2017) 30 tablet 0  . oxybutynin (DITROPAN-XL) 5 MG 24  hr tablet TAKE 1 TABLET BY MOUTH EVERYDAY AT BEDTIME (Patient not taking: Reported on 09/19/2017) 30 tablet 3  . prochlorperazine (COMPAZINE) 10 MG tablet Take 1 tablet (10 mg total) by mouth every 6 (six) hours as needed for nausea or vomiting. (Patient not taking: Reported on 09/19/2017) 30 tablet 1  . temazepam (RESTORIL) 15 MG capsule Take 15 mg by mouth at bedtime as needed for sleep.     No current facility-administered medications for this encounter.    Facility-Administered Medications Ordered in Other Encounters  Medication Dose Route Frequency Provider Last Rate Last Dose  . heparin lock flush 100 unit/mL  500 Units Intravenous Once Livesay, Lennis P, MD      . sodium chloride flush (NS) 0.9 % injection 10 mL  10 mL Intravenous PRN Livesay, Lennis P, MD      . sodium chloride flush (NS) 0.9 % injection 10 mL  10 mL Intracatheter PRN Alvy Bimler, Ni, MD   10 mL at 03/29/17 1658    REVIEW OF SYSTEMS:  REVIEW OF SYSTEMS: A 10+ POINT REVIEW OF SYSTEMS WAS OBTAINED including neurology, dermatology, psychiatry, cardiac, respiratory, lymph, extremities, GI, GU, musculoskeletal, constitutional, reproductive, HEENT. All pertinent positives are noted in the HPI. All others are negative.   PHYSICAL EXAM:  height is 5' 4.5" (1.638 m) and weight is 156 lb 12.8 oz (71.1 kg). Her oral temperature is 99 F (37.2 C). Her blood pressure is 107/71 and her pulse is 99. Her respiration is 18 and oxygen saturation is 99%.   General: Alert and oriented, in no acute distress HEENT: Head is normocephalic. Extraocular movements are intact. Oropharynx is clear. Neck: Neck is supple, no palpable cervical or supraclavicular lymphadenopathy. Heart: Regular in rate and rhythm with no murmurs, rubs, or gallops. Chest: Clear to auscultation bilaterally, with no rhonchi, wheezes, or rales. Abdomen: Soft, nontender, nondistended, with no rigidity or guarding. Extremities: No cyanosis or edema. Lymphatics: see Neck  Exam Skin: No concerning lesions. Musculoskeletal: symmetric strength and muscle tone throughout. Neurologic: Cranial nerves II through XII are grossly intact. No obvious focalities. Speech is fluent. Coordination is intact. Psychiatric: Judgment and insight are intact. Affect is appropriate. Pelvic exam: patient has a tumor mass that extends along the anterior vaginal wall, close to the introitus which estimated to be approximately 3 x 5 cm in size. Patient has some serosanguinous discharge in the vaginal cavity. Further up in the vaginal cavity is a hard mass, presumably the lower portion of the cervix.  Rectal exam: patient has palpable tumor as far as digital exam will allow along the anterior rectal wall. Question rectal wall invasion by tumor.  Patient has colostomy bag in lower left quadrant, which is functioning well.   ECOG = 1  0 - Asymptomatic (Fully active, able to carry on all predisease activities without restriction)  1 - Symptomatic but completely ambulatory (Restricted in physically strenuous activity but ambulatory and able to carry out work of a light or sedentary nature. For example, light housework, office work)  2 - Symptomatic, <50% in bed during the day (Ambulatory and capable of all self care but unable to carry out any work activities. Up and about more than 50% of waking hours)  3 - Symptomatic, >50% in bed, but not bedbound (Capable of only limited self-care, confined to bed  or chair 50% or more of waking hours)  4 - Bedbound (Completely disabled. Cannot carry on any self-care. Totally confined to bed or chair)  5 - Death   Eustace Pen MM, Creech RH, Tormey DC, et al. [phone]). "Toxicity and response criteria of the [facility] Group". [place] Oncol. 5 (6): 649-55  LABORATORY DATA:  Lab Results  Component Value Date   WBC 9.4 09/09/2017   HGB 10.9 (L) 09/09/2017   HCT 33.5 (L) 09/09/2017   MCV 87.0 09/09/2017   PLT 223 09/09/2017   NEUTROABS  6.7 (H) 09/09/2017   Lab Results  Component Value Date   NA 140 09/09/2017   K 4.5 09/09/2017   CL 105 09/09/2017   CO2 28 09/09/2017   GLUCOSE 101 (H) 09/09/2017   CREATININE 1.28 (H) 09/09/2017   CALCIUM 9.6 09/09/2017      RADIOGRAPHY: Ct Abdomen Pelvis Wo Contrast  Result Date: 09/13/2017 CLINICAL DATA:  Metastatic cervical cancer with lung and liver metastases. EXAM: CT CHEST, ABDOMEN AND PELVIS WITHOUT CONTRAST TECHNIQUE: Multidetector CT imaging of the chest, abdomen and pelvis was performed following the standard protocol without IV contrast. COMPARISON:  07/09/2017 FINDINGS: CT CHEST FINDINGS Cardiovascular: The heart size is normal. No substantial pericardial effusion. Atherosclerotic calcification is noted in the wall of the thoracic aorta. Right Port-A-Cath tip is positioned in the mid to distal SVC. Mediastinum/Nodes: Interval progression of precarinal lymph node now measuring 11 mm in short axis compared to 5 mm previously. 8 mm short axis subcarinal lymph node was about 3 mm previously. No evidence for gross hilar lymphadenopathy although assessment is limited by the lack of intravenous contrast on today's study. The esophagus has normal imaging features. There is no axillary lymphadenopathy. Lungs/Pleura: The central tracheobronchial airways are patent. Innumerable bilateral pulmonary nodules are evident, progressive in the interval. 13 mm right lower lobe nodule, just posterior to fissure (4:77) was 8 mm previously. 10 mm left lower lobe pulmonary nodule (4:108) was 8 mm previously. 14 mm right lower lobe pulmonary nodule (4:113) was 8 mm previously. New pulmonary nodules are evident bilaterally. Musculoskeletal: Sclerotic lesion in the left scapula (4:25) is stable. CT ABDOMEN PELVIS FINDINGS Hepatobiliary: Stable atrophy in the medial segment left liver. Biliary dilatation in the lateral segment left liver is progressive in the interval. New hypoattenuating liver lesions are  identified in the right hepatic lobe. Index 16 mm lesion (2:60) is new in the interval. 12 mm subcapsular right liver lesion (2:59) is also new. There is no evidence for gallstones, gallbladder wall thickening, or pericholecystic fluid. No intrahepatic or extrahepatic biliary dilation. Pancreas: No focal mass lesion. No dilatation of the main duct. No intraparenchymal cyst. No peripancreatic edema. Spleen: No splenomegaly. No focal mass lesion. Adrenals/Urinary Tract: No adrenal nodule or mass. Kidneys unremarkable. No evidence for hydroureter. Bladder decompressed. Stomach/Bowel: Stomach is nondistended. No gastric wall thickening. No evidence of outlet obstruction. Duodenum is normally positioned as is the ligament of Treitz. No small bowel wall thickening. No small bowel dilatation. The terminal ileum is normal. The appendix is not visualized, but there is no edema or inflammation in the region of the cecum. Left lower quadrant loop colostomy evident. Vascular/Lymphatic: No abdominal aortic aneurysm. Progression of retroperitoneal lymphadenopathy evident. 18 mm short axis aortocaval lymph node is seen on series 2, image 79. New 8 mm short axis right common iliac lymph node. Reproductive: Soft tissue mass in the region of the cervix, previously measured at 4.3 x 5.3 cm now measures  4.7 x 6.2 cm. Fluid-filled dilated uterine cavity appears slightly progressive in the interval. The right adnexal soft tissue lesion measured previously at 5.5 x 3.2 cm now measures 6.5 x 2.8 cm. Left adnexal soft tissue lesion measured previously at 3.7 x 2.9 cm now measures 3.4 x 4.5 cm. Other: No intraperitoneal free fluid. Musculoskeletal: No worrisome lytic or sclerotic osseous abnormality. Stable appearance of compression deformity at T11, T12, and L5. IMPRESSION: 1. Continued progression of pulmonary metastases with enlargement of previous nodules and development of new nodules. 2. New liver lesions measuring up to 16 mm,  consistent with metastases. 3. New mediastinal lymphadenopathy consistent with metastatic disease. 4. New retroperitoneal lymphadenopathy in the abdomen. 5. Interval progression of abnormal soft tissue in the region of the cervix and in both adnexal regions. 6. No evidence of bowel obstruction or urinary obstruction. Electronically Signed   By: Misty Stanley M.D.   On: 09/13/2017 15:13   Ct Chest Wo Contrast  Result Date: 09/13/2017 CLINICAL DATA:  Metastatic cervical cancer with lung and liver metastases. EXAM: CT CHEST, ABDOMEN AND PELVIS WITHOUT CONTRAST TECHNIQUE: Multidetector CT imaging of the chest, abdomen and pelvis was performed following the standard protocol without IV contrast. COMPARISON:  07/09/2017 FINDINGS: CT CHEST FINDINGS Cardiovascular: The heart size is normal. No substantial pericardial effusion. Atherosclerotic calcification is noted in the wall of the thoracic aorta. Right Port-A-Cath tip is positioned in the mid to distal SVC. Mediastinum/Nodes: Interval progression of precarinal lymph node now measuring 11 mm in short axis compared to 5 mm previously. 8 mm short axis subcarinal lymph node was about 3 mm previously. No evidence for gross hilar lymphadenopathy although assessment is limited by the lack of intravenous contrast on today's study. The esophagus has normal imaging features. There is no axillary lymphadenopathy. Lungs/Pleura: The central tracheobronchial airways are patent. Innumerable bilateral pulmonary nodules are evident, progressive in the interval. 13 mm right lower lobe nodule, just posterior to fissure (4:77) was 8 mm previously. 10 mm left lower lobe pulmonary nodule (4:108) was 8 mm previously. 14 mm right lower lobe pulmonary nodule (4:113) was 8 mm previously. New pulmonary nodules are evident bilaterally. Musculoskeletal: Sclerotic lesion in the left scapula (4:25) is stable. CT ABDOMEN PELVIS FINDINGS Hepatobiliary: Stable atrophy in the medial segment left  liver. Biliary dilatation in the lateral segment left liver is progressive in the interval. New hypoattenuating liver lesions are identified in the right hepatic lobe. Index 16 mm lesion (2:60) is new in the interval. 12 mm subcapsular right liver lesion (2:59) is also new. There is no evidence for gallstones, gallbladder wall thickening, or pericholecystic fluid. No intrahepatic or extrahepatic biliary dilation. Pancreas: No focal mass lesion. No dilatation of the main duct. No intraparenchymal cyst. No peripancreatic edema. Spleen: No splenomegaly. No focal mass lesion. Adrenals/Urinary Tract: No adrenal nodule or mass. Kidneys unremarkable. No evidence for hydroureter. Bladder decompressed. Stomach/Bowel: Stomach is nondistended. No gastric wall thickening. No evidence of outlet obstruction. Duodenum is normally positioned as is the ligament of Treitz. No small bowel wall thickening. No small bowel dilatation. The terminal ileum is normal. The appendix is not visualized, but there is no edema or inflammation in the region of the cecum. Left lower quadrant loop colostomy evident. Vascular/Lymphatic: No abdominal aortic aneurysm. Progression of retroperitoneal lymphadenopathy evident. 18 mm short axis aortocaval lymph node is seen on series 2, image 79. New 8 mm short axis right common iliac lymph node. Reproductive: Soft tissue mass in the region of the cervix,  previously measured at 4.3 x 5.3 cm now measures 4.7 x 6.2 cm. Fluid-filled dilated uterine cavity appears slightly progressive in the interval. The right adnexal soft tissue lesion measured previously at 5.5 x 3.2 cm now measures 6.5 x 2.8 cm. Left adnexal soft tissue lesion measured previously at 3.7 x 2.9 cm now measures 3.4 x 4.5 cm. Other: No intraperitoneal free fluid. Musculoskeletal: No worrisome lytic or sclerotic osseous abnormality. Stable appearance of compression deformity at T11, T12, and L5. IMPRESSION: 1. Continued progression of pulmonary  metastases with enlargement of previous nodules and development of new nodules. 2. New liver lesions measuring up to 16 mm, consistent with metastases. 3. New mediastinal lymphadenopathy consistent with metastatic disease. 4. New retroperitoneal lymphadenopathy in the abdomen. 5. Interval progression of abnormal soft tissue in the region of the cervix and in both adnexal regions. 6. No evidence of bowel obstruction or urinary obstruction. Electronically Signed   By: Misty Stanley M.D.   On: 09/13/2017 15:13      IMPRESSION: Stage IV Cervical Cancer Patient has had significant progression in the pelvis and has become symptomatic with some pain, vaginal drainage, and some mild bleeding. She would be a good candidate for palliative therapy to the pelvic region.  Today, I talked to the patient about the findings and work-up thus far.  We discussed the natural history of cervical cancer and general treatment, highlighting the role of radiotherapy in the management.  We discussed the available radiation techniques, and focused on the details of logistics and delivery.  We reviewed the anticipated acute and late sequelae associated with radiation in this setting.  The patient was encouraged to ask questions that I answered to the best of my ability.  A patient consent form was discussed and signed.  We retained a copy for our records.  The patient would like to proceed with radiation and will be scheduled for CT simulation.  PLAN: Patient will proceed with CT simulation this afternoon with treatments to begin early next week. I anticipate 5 week of radiation therapy. The patient's chemotherapy will be held during her radiation therapy.       ------------------------------------------------  Blair Promise, PhD, MD  This document serves as a record of services personally performed by Gery Pray, MD. It was created on his behalf by Wilburn Mylar, a trained medical scribe. The creation of this record is  based on the scribe's personal observations and the provider's statements to them. This document has been checked and approved by the attending provider.

## 2017-09-19 NOTE — Progress Notes (Signed)
  Radiation Oncology         [phone] ________________________________  Name: Katherine Boone MRN: [id number]  Date: 09/19/2017  DOB: [birth year]  SIMULATION AND TREATMENT PLANNING NOTE    ICD-10-CM   1. International Federation of Gynecology and Obstetrics (FIGO) stage IVB malignant neoplasm of cervix ([place]) C53.9     DIAGNOSIS:  Malignant neoplasm of cervix with metastases to lungs and liver  NARRATIVE:  The patient was brought to the [facility].  Identity was confirmed.  All relevant records and images related to the planned course of therapy were reviewed.  The patient freely provided informed written consent to proceed with treatment after reviewing the details related to the planned course of therapy. The consent form was witnessed and verified by the simulation staff.  Then, the patient was set-up in a stable reproducible  supine position for radiation therapy.  CT images were obtained.  Surface markings were placed.  The CT images were loaded into the planning software.  Then the target and avoidance structures were contoured.  Treatment planning then occurred.  The radiation prescription was entered and confirmed.  Then, I designed and supervised the construction of a total of 5 medically necessary complex treatment devices.  I have requested : 3D Simulation  I have requested a DVH of the following structures: GTV, PTV , bladder, rectum.  I have ordered:dose calc.  PLAN:  The patient will receive 45 Gy in 25 fractions to the central pelvic mass  -----------------------------------  Blair Promise, PhD, MD  This document serves as a record of services personally performed by Gery Pray, MD. It was created on his behalf by Wilburn Mylar, a trained medical scribe. The creation of this record is based on the scribe's personal observations and the provider's statements to them. This document has been checked and approved by the attending provider.

## 2017-09-23 DIAGNOSIS — Z510 Encounter for antineoplastic radiation therapy: Secondary | ICD-10-CM | POA: Diagnosis not present

## 2017-09-23 DIAGNOSIS — C539 Malignant neoplasm of cervix uteri, unspecified: Secondary | ICD-10-CM | POA: Diagnosis not present

## 2017-09-25 ENCOUNTER — Ambulatory Visit
Admission: RE | Admit: 2017-09-25 | Discharge: 2017-09-25 | Disposition: A | Discharge status: Home / Self Care | Payer: Medicare HMO | Source: Ambulatory Visit | Attending: Radiation Oncology | Admitting: Radiation Oncology

## 2017-09-25 ENCOUNTER — Ambulatory Visit: Payer: Medicare HMO | Admitting: Radiation Oncology

## 2017-09-25 DIAGNOSIS — C539 Malignant neoplasm of cervix uteri, unspecified: Secondary | ICD-10-CM | POA: Diagnosis not present

## 2017-09-25 DIAGNOSIS — Z510 Encounter for antineoplastic radiation therapy: Secondary | ICD-10-CM | POA: Diagnosis not present

## 2017-09-25 NOTE — Progress Notes (Signed)
  Radiation Oncology         [phone] ________________________________  Name: Katherine Boone MRN: [id number]  Date: 09/25/2017  DOB: 04/16/1945  Simulation Verification Note    ICD-10-CM   1. International Federation of Gynecology and Obstetrics (FIGO) stage IVB malignant neoplasm of cervix ([place]) C53.9     Status: outpatient  NARRATIVE: The patient was brought to the treatment unit and placed in the planned treatment position. The clinical setup was verified. Then port films were obtained and uploaded to the radiation oncology medical record software.  The treatment beams were carefully compared against the planned radiation fields. The position location and shape of the radiation fields was reviewed. They targeted volume of tissue appears to be appropriately covered by the radiation beams. Organs at risk appear to be excluded as planned.  Based on my personal review, I approved the simulation verification. The patient's treatment will proceed as planned.  -----------------------------------  Blair Promise, PhD, MD

## 2017-09-26 ENCOUNTER — Ambulatory Visit
Admission: RE | Admit: 2017-09-26 | Discharge: 2017-09-26 | Disposition: A | Discharge status: Home / Self Care | Payer: Medicare HMO | Source: Ambulatory Visit | Attending: Radiation Oncology | Admitting: Radiation Oncology

## 2017-09-26 ENCOUNTER — Ambulatory Visit: Payer: Medicare HMO

## 2017-09-26 ENCOUNTER — Other Ambulatory Visit: Payer: Medicare HMO

## 2017-09-26 ENCOUNTER — Ambulatory Visit: Payer: Medicare HMO | Admitting: Hematology and Oncology

## 2017-09-26 DIAGNOSIS — Z510 Encounter for antineoplastic radiation therapy: Secondary | ICD-10-CM | POA: Diagnosis not present

## 2017-09-26 DIAGNOSIS — C539 Malignant neoplasm of cervix uteri, unspecified: Secondary | ICD-10-CM | POA: Diagnosis not present

## 2017-09-27 ENCOUNTER — Ambulatory Visit
Admission: RE | Admit: 2017-09-27 | Discharge: 2017-09-27 | Disposition: A | Discharge status: Home / Self Care | Payer: Medicare HMO | Source: Ambulatory Visit | Attending: Radiation Oncology | Admitting: Radiation Oncology

## 2017-09-27 ENCOUNTER — Ambulatory Visit: Payer: Medicare HMO

## 2017-09-27 DIAGNOSIS — Z510 Encounter for antineoplastic radiation therapy: Secondary | ICD-10-CM | POA: Diagnosis not present

## 2017-09-27 DIAGNOSIS — C539 Malignant neoplasm of cervix uteri, unspecified: Secondary | ICD-10-CM | POA: Diagnosis not present

## 2017-09-30 ENCOUNTER — Ambulatory Visit: Payer: Medicare HMO

## 2017-09-30 ENCOUNTER — Ambulatory Visit
Admission: RE | Admit: 2017-09-30 | Discharge: 2017-09-30 | Disposition: A | Discharge status: Home / Self Care | Payer: Medicare HMO | Source: Ambulatory Visit | Attending: Radiation Oncology | Admitting: Radiation Oncology

## 2017-09-30 DIAGNOSIS — C539 Malignant neoplasm of cervix uteri, unspecified: Secondary | ICD-10-CM | POA: Diagnosis not present

## 2017-09-30 DIAGNOSIS — Z510 Encounter for antineoplastic radiation therapy: Secondary | ICD-10-CM | POA: Diagnosis not present

## 2017-10-01 ENCOUNTER — Ambulatory Visit: Payer: Medicare HMO

## 2017-10-01 ENCOUNTER — Ambulatory Visit
Admission: RE | Admit: 2017-10-01 | Discharge: 2017-10-01 | Disposition: A | Discharge status: Home / Self Care | Payer: Medicare HMO | Source: Ambulatory Visit | Attending: Radiation Oncology | Admitting: Radiation Oncology

## 2017-10-01 DIAGNOSIS — C539 Malignant neoplasm of cervix uteri, unspecified: Secondary | ICD-10-CM | POA: Diagnosis not present

## 2017-10-01 DIAGNOSIS — Z510 Encounter for antineoplastic radiation therapy: Secondary | ICD-10-CM | POA: Diagnosis not present

## 2017-10-02 ENCOUNTER — Ambulatory Visit
Admission: RE | Admit: 2017-10-02 | Discharge: 2017-10-02 | Disposition: A | Discharge status: Home / Self Care | Payer: Medicare HMO | Source: Ambulatory Visit | Attending: Radiation Oncology | Admitting: Radiation Oncology

## 2017-10-02 ENCOUNTER — Ambulatory Visit: Payer: Medicare HMO

## 2017-10-02 DIAGNOSIS — Z510 Encounter for antineoplastic radiation therapy: Secondary | ICD-10-CM | POA: Diagnosis not present

## 2017-10-02 DIAGNOSIS — C539 Malignant neoplasm of cervix uteri, unspecified: Secondary | ICD-10-CM | POA: Diagnosis not present

## 2017-10-03 ENCOUNTER — Ambulatory Visit
Admission: RE | Admit: 2017-10-03 | Discharge: 2017-10-03 | Disposition: A | Discharge status: Home / Self Care | Payer: Medicare HMO | Source: Ambulatory Visit | Attending: Radiation Oncology | Admitting: Radiation Oncology

## 2017-10-03 ENCOUNTER — Ambulatory Visit: Payer: Medicare HMO

## 2017-10-03 DIAGNOSIS — C539 Malignant neoplasm of cervix uteri, unspecified: Secondary | ICD-10-CM | POA: Diagnosis not present

## 2017-10-03 DIAGNOSIS — Z510 Encounter for antineoplastic radiation therapy: Secondary | ICD-10-CM | POA: Diagnosis not present

## 2017-10-04 ENCOUNTER — Ambulatory Visit
Admission: RE | Admit: 2017-10-04 | Discharge: 2017-10-04 | Disposition: A | Discharge status: Home / Self Care | Payer: Medicare HMO | Source: Ambulatory Visit | Attending: Radiation Oncology | Admitting: Radiation Oncology

## 2017-10-04 ENCOUNTER — Ambulatory Visit: Payer: Medicare HMO

## 2017-10-04 DIAGNOSIS — C539 Malignant neoplasm of cervix uteri, unspecified: Secondary | ICD-10-CM | POA: Diagnosis not present

## 2017-10-04 DIAGNOSIS — Z510 Encounter for antineoplastic radiation therapy: Secondary | ICD-10-CM | POA: Diagnosis not present

## 2017-10-07 ENCOUNTER — Ambulatory Visit
Admission: RE | Admit: 2017-10-07 | Discharge: 2017-10-07 | Disposition: A | Discharge status: Home / Self Care | Payer: Medicare HMO | Source: Ambulatory Visit | Attending: Radiation Oncology | Admitting: Radiation Oncology

## 2017-10-07 ENCOUNTER — Ambulatory Visit: Payer: Medicare HMO

## 2017-10-07 DIAGNOSIS — C539 Malignant neoplasm of cervix uteri, unspecified: Secondary | ICD-10-CM | POA: Diagnosis not present

## 2017-10-07 DIAGNOSIS — Z510 Encounter for antineoplastic radiation therapy: Secondary | ICD-10-CM | POA: Diagnosis not present

## 2017-10-08 ENCOUNTER — Telehealth: Payer: Self-pay | Admitting: Hematology and Oncology

## 2017-10-08 ENCOUNTER — Ambulatory Visit
Admission: RE | Admit: 2017-10-08 | Discharge: 2017-10-08 | Disposition: A | Discharge status: Home / Self Care | Payer: Medicare HMO | Source: Ambulatory Visit | Attending: Radiation Oncology | Admitting: Radiation Oncology

## 2017-10-08 ENCOUNTER — Other Ambulatory Visit: Payer: Self-pay | Admitting: Hematology and Oncology

## 2017-10-08 ENCOUNTER — Ambulatory Visit: Payer: Medicare HMO

## 2017-10-08 DIAGNOSIS — Z510 Encounter for antineoplastic radiation therapy: Secondary | ICD-10-CM | POA: Diagnosis not present

## 2017-10-08 DIAGNOSIS — C539 Malignant neoplasm of cervix uteri, unspecified: Secondary | ICD-10-CM | POA: Diagnosis not present

## 2017-10-08 NOTE — Telephone Encounter (Signed)
Spoke to patient regarding upcoming aug appts per 8/6 sch message  °

## 2017-10-09 ENCOUNTER — Telehealth: Payer: Self-pay

## 2017-10-09 ENCOUNTER — Ambulatory Visit
Admission: RE | Admit: 2017-10-09 | Discharge: 2017-10-09 | Disposition: A | Discharge status: Home / Self Care | Payer: Medicare HMO | Source: Ambulatory Visit | Attending: Radiation Oncology | Admitting: Radiation Oncology

## 2017-10-09 ENCOUNTER — Ambulatory Visit: Payer: Medicare HMO

## 2017-10-09 DIAGNOSIS — Z510 Encounter for antineoplastic radiation therapy: Secondary | ICD-10-CM | POA: Diagnosis not present

## 2017-10-09 DIAGNOSIS — C539 Malignant neoplasm of cervix uteri, unspecified: Secondary | ICD-10-CM | POA: Diagnosis not present

## 2017-10-09 NOTE — Telephone Encounter (Signed)
Nutrition Assessment   Reason for Assessment:   Patient identified on Malnutrition Screening report for weight loss    ASSESSMENT:   72 year old female with cervical cancer with metastatic disease to lung.  Planning palliative radiation and then chemotherapy  Called patient to introduce service.  Patient reports appetite is poor. "I am not hungry."  Reports some issues with nausea, has had diarrhea but that is resolved.  Does not like boost/ensure shakes.      Nutrition Focused Physical Exam: deferred   Medications: reviewed   Labs: reviewed   Anthropometrics:   Height: 64.5 inches Weight: 156 lb 12.8 oz Noted weight of 173 lb in 05/08/17 9% weight loss in the last 4 months, significant BMI: 26    NUTRITION DIAGNOSIS: Inadequate oral intake related to cancer and cancer related treatments as evidenced by 9% weight loss in the last 4 months, decreased intake   INTERVENTION:  Discussed ways to increase calories and protein.  Will mail information Encouraged patient to try carnation breakfast essentials shakes Contact information mailed. Discussed that patient can make nutrition appointment at anytime if needed   MONITORING, EVALUATION, GOAL: patient will consume adequate calories and protein to maintain weight   Next Visit: pt to contact  Pasha Broad B. Zenia Resides, [place], [place] Registered Dietitian [phone] (pager)

## 2017-10-10 ENCOUNTER — Ambulatory Visit
Admission: RE | Admit: 2017-10-10 | Discharge: 2017-10-10 | Disposition: A | Discharge status: Home / Self Care | Payer: Medicare HMO | Source: Ambulatory Visit | Attending: Radiation Oncology | Admitting: Radiation Oncology

## 2017-10-10 ENCOUNTER — Ambulatory Visit: Payer: Medicare HMO

## 2017-10-10 DIAGNOSIS — C539 Malignant neoplasm of cervix uteri, unspecified: Secondary | ICD-10-CM | POA: Diagnosis not present

## 2017-10-10 DIAGNOSIS — Z510 Encounter for antineoplastic radiation therapy: Secondary | ICD-10-CM | POA: Diagnosis not present

## 2017-10-11 ENCOUNTER — Ambulatory Visit: Payer: Medicare HMO

## 2017-10-14 ENCOUNTER — Other Ambulatory Visit: Payer: Self-pay | Admitting: *Deleted

## 2017-10-14 ENCOUNTER — Ambulatory Visit: Payer: Medicare HMO

## 2017-10-14 ENCOUNTER — Ambulatory Visit
Admission: RE | Admit: 2017-10-14 | Discharge: 2017-10-14 | Disposition: A | Discharge status: Home / Self Care | Payer: Medicare HMO | Source: Ambulatory Visit | Attending: Radiation Oncology | Admitting: Radiation Oncology

## 2017-10-14 DIAGNOSIS — R3915 Urgency of urination: Secondary | ICD-10-CM

## 2017-10-14 DIAGNOSIS — Z510 Encounter for antineoplastic radiation therapy: Secondary | ICD-10-CM | POA: Diagnosis not present

## 2017-10-14 DIAGNOSIS — C539 Malignant neoplasm of cervix uteri, unspecified: Secondary | ICD-10-CM | POA: Diagnosis not present

## 2017-10-14 LAB — URINALYSIS, COMPLETE (UACMP) WITH MICROSCOPIC
BILIRUBIN URINE: NEGATIVE
Glucose, UA: NEGATIVE mg/dL
KETONES UR: NEGATIVE mg/dL
NITRITE: POSITIVE — AB
Protein, ur: 100 mg/dL — AB
SPECIFIC GRAVITY, URINE: 1.024 (ref 1.005–1.030)
pH: 5 (ref 5.0–8.0)

## 2017-10-15 ENCOUNTER — Ambulatory Visit
Admission: RE | Admit: 2017-10-15 | Discharge: 2017-10-15 | Disposition: A | Discharge status: Home / Self Care | Payer: Medicare HMO | Source: Ambulatory Visit | Attending: Radiation Oncology | Admitting: Radiation Oncology

## 2017-10-15 ENCOUNTER — Ambulatory Visit: Payer: Medicare HMO

## 2017-10-15 ENCOUNTER — Telehealth: Payer: Self-pay

## 2017-10-15 ENCOUNTER — Other Ambulatory Visit: Payer: Self-pay | Admitting: Radiation Oncology

## 2017-10-15 DIAGNOSIS — C539 Malignant neoplasm of cervix uteri, unspecified: Secondary | ICD-10-CM | POA: Diagnosis not present

## 2017-10-15 DIAGNOSIS — Z510 Encounter for antineoplastic radiation therapy: Secondary | ICD-10-CM | POA: Diagnosis not present

## 2017-10-15 DIAGNOSIS — N3000 Acute cystitis without hematuria: Secondary | ICD-10-CM

## 2017-10-15 MED ORDER — NITROFURANTOIN MONOHYD MACRO 100 MG PO CAPS: 100.0000 mg | ORAL_CAPSULE | Freq: Two times a day (BID) | ORAL | 0 refills | Status: DC

## 2017-10-15 NOTE — Telephone Encounter (Signed)
Reached pt on her home phone, and instructed her on plan to start antibiotic today with the possibility of switching antibiotics once urine cultures result. Pt verbalized understanding and agreement, and knows to call office back should she have any further questions/concerns.

## 2017-10-15 NOTE — Telephone Encounter (Signed)
Left VM on pt's cell phone letting her know that her UA culture results were still pending, but that Dr. Isidore Moos would be calling in an antibiotic prescription later this afternoon for her start. Once the cultures have resulted if the antibiotic needed to be changed, I informed the pt that Dr. Isidore Moos would call in a different prescription, and I would call to let her know of the change. Asked for pt to call back to ensure she received the message, understood the plan, and the we had the correct pharmacy on file. Waiting for callback.

## 2017-10-16 ENCOUNTER — Ambulatory Visit
Admission: RE | Admit: 2017-10-16 | Discharge: 2017-10-16 | Disposition: A | Discharge status: Home / Self Care | Payer: Medicare HMO | Source: Ambulatory Visit | Attending: Radiation Oncology | Admitting: Radiation Oncology

## 2017-10-16 ENCOUNTER — Telehealth: Payer: Self-pay

## 2017-10-16 ENCOUNTER — Ambulatory Visit: Payer: Medicare HMO

## 2017-10-16 DIAGNOSIS — C539 Malignant neoplasm of cervix uteri, unspecified: Secondary | ICD-10-CM | POA: Diagnosis not present

## 2017-10-16 DIAGNOSIS — Z510 Encounter for antineoplastic radiation therapy: Secondary | ICD-10-CM | POA: Diagnosis not present

## 2017-10-16 LAB — URINE CULTURE

## 2017-10-16 NOTE — Telephone Encounter (Signed)
Called pt to ensure she picked up her antibiotic and is tolerating it thus far. Pt stated she took a dose last night around 19:00 and another this morning around 07:30, and has had no issues. She did mention that she was still bothered by the continued urinary urgency/frequency. Instructed the pt that she could drink cranberry juice and take OTC Azo to help with those symptoms. Reinforced that those interventions only treat the symptoms and not the actual UTI, and it is vital she takes the antibiotic to completion. Pt verbalized understanding and agreement. Instructed pt to call office back should she have any other issues/concerns, or if her symptoms do not improve once she completes the course of antibiotics.  Pt verbalized understanding and agreement.

## 2017-10-17 ENCOUNTER — Ambulatory Visit: Payer: Medicare HMO

## 2017-10-17 ENCOUNTER — Ambulatory Visit
Admission: RE | Admit: 2017-10-17 | Discharge: 2017-10-17 | Disposition: A | Discharge status: Home / Self Care | Payer: Medicare HMO | Source: Ambulatory Visit | Attending: Radiation Oncology | Admitting: Radiation Oncology

## 2017-10-17 DIAGNOSIS — Z510 Encounter for antineoplastic radiation therapy: Secondary | ICD-10-CM | POA: Diagnosis not present

## 2017-10-17 DIAGNOSIS — C539 Malignant neoplasm of cervix uteri, unspecified: Secondary | ICD-10-CM | POA: Diagnosis not present

## 2017-10-18 ENCOUNTER — Ambulatory Visit
Admission: RE | Admit: 2017-10-18 | Discharge: 2017-10-18 | Disposition: A | Discharge status: Home / Self Care | Payer: Medicare HMO | Source: Ambulatory Visit | Attending: Radiation Oncology | Admitting: Radiation Oncology

## 2017-10-18 ENCOUNTER — Ambulatory Visit: Payer: Medicare HMO

## 2017-10-18 DIAGNOSIS — Z510 Encounter for antineoplastic radiation therapy: Secondary | ICD-10-CM | POA: Diagnosis not present

## 2017-10-18 DIAGNOSIS — C539 Malignant neoplasm of cervix uteri, unspecified: Secondary | ICD-10-CM | POA: Diagnosis not present

## 2017-10-20 ENCOUNTER — Ambulatory Visit: Admission: RE | Admit: 2017-10-20 | Payer: Medicare HMO | Source: Ambulatory Visit

## 2017-10-21 ENCOUNTER — Ambulatory Visit: Payer: Medicare HMO

## 2017-10-21 ENCOUNTER — Ambulatory Visit
Admission: RE | Admit: 2017-10-21 | Discharge: 2017-10-21 | Disposition: A | Discharge status: Home / Self Care | Payer: Medicare HMO | Source: Ambulatory Visit | Attending: Radiation Oncology | Admitting: Radiation Oncology

## 2017-10-21 DIAGNOSIS — C539 Malignant neoplasm of cervix uteri, unspecified: Secondary | ICD-10-CM | POA: Diagnosis not present

## 2017-10-21 DIAGNOSIS — Z510 Encounter for antineoplastic radiation therapy: Secondary | ICD-10-CM | POA: Diagnosis not present

## 2017-10-22 ENCOUNTER — Ambulatory Visit: Payer: Medicare HMO

## 2017-10-22 ENCOUNTER — Ambulatory Visit
Admission: RE | Admit: 2017-10-22 | Discharge: 2017-10-22 | Disposition: A | Discharge status: Home / Self Care | Payer: Medicare HMO | Source: Ambulatory Visit | Attending: Radiation Oncology | Admitting: Radiation Oncology

## 2017-10-22 ENCOUNTER — Other Ambulatory Visit: Payer: Self-pay | Admitting: Radiation Oncology

## 2017-10-22 ENCOUNTER — Other Ambulatory Visit: Payer: Self-pay

## 2017-10-22 ENCOUNTER — Telehealth: Payer: Self-pay | Admitting: *Deleted

## 2017-10-22 DIAGNOSIS — E46 Unspecified protein-calorie malnutrition: Secondary | ICD-10-CM

## 2017-10-22 DIAGNOSIS — Z510 Encounter for antineoplastic radiation therapy: Secondary | ICD-10-CM | POA: Diagnosis not present

## 2017-10-22 DIAGNOSIS — C539 Malignant neoplasm of cervix uteri, unspecified: Secondary | ICD-10-CM | POA: Diagnosis not present

## 2017-10-22 LAB — CBC WITH DIFFERENTIAL (CANCER CENTER ONLY)
BASOS ABS: 0 10*3/uL (ref 0.0–0.1)
Basophils Relative: 0 %
EOS PCT: 1 %
Eosinophils Absolute: 0.1 10*3/uL (ref 0.0–0.5)
HCT: 36.6 % (ref 34.8–46.6)
Hemoglobin: 12 g/dL (ref 11.6–15.9)
LYMPHS PCT: 8 %
Lymphs Abs: 0.8 10*3/uL — ABNORMAL LOW (ref 0.9–3.3)
MCH: 27.7 pg (ref 25.1–34.0)
MCHC: 32.8 g/dL (ref 31.5–36.0)
MCV: 84.5 fL (ref 79.5–101.0)
MONO ABS: 1.1 10*3/uL — AB (ref 0.1–0.9)
Monocytes Relative: 11 %
Neutro Abs: 8.2 10*3/uL — ABNORMAL HIGH (ref 1.5–6.5)
Neutrophils Relative %: 80 %
PLATELETS: 169 10*3/uL (ref 145–400)
RBC: 4.33 MIL/uL (ref 3.70–5.45)
RDW: 14.6 % — AB (ref 11.2–14.5)
WBC Count: 10.2 10*3/uL (ref 3.9–10.3)

## 2017-10-22 NOTE — Telephone Encounter (Signed)
Called patient to inform of labs for 10-23-17 @ 11:30 am @ [facility], lvm for a return call

## 2017-10-23 ENCOUNTER — Inpatient Hospital Stay: Payer: Medicare HMO | Attending: Hematology and Oncology

## 2017-10-23 ENCOUNTER — Ambulatory Visit: Payer: Medicare HMO

## 2017-10-23 ENCOUNTER — Ambulatory Visit
Admission: RE | Admit: 2017-10-23 | Discharge: 2017-10-23 | Disposition: A | Discharge status: Home / Self Care | Payer: Medicare HMO | Source: Ambulatory Visit | Attending: Radiation Oncology | Admitting: Radiation Oncology

## 2017-10-23 ENCOUNTER — Other Ambulatory Visit: Payer: Self-pay

## 2017-10-23 VITALS — BP 108/70 | HR 89 | Temp 98.4°F | Resp 17

## 2017-10-23 DIAGNOSIS — C7801 Secondary malignant neoplasm of right lung: Secondary | ICD-10-CM | POA: Diagnosis not present

## 2017-10-23 DIAGNOSIS — G893 Neoplasm related pain (acute) (chronic): Secondary | ICD-10-CM | POA: Diagnosis not present

## 2017-10-23 DIAGNOSIS — C786 Secondary malignant neoplasm of retroperitoneum and peritoneum: Secondary | ICD-10-CM | POA: Diagnosis not present

## 2017-10-23 DIAGNOSIS — Z510 Encounter for antineoplastic radiation therapy: Secondary | ICD-10-CM | POA: Diagnosis not present

## 2017-10-23 DIAGNOSIS — C781 Secondary malignant neoplasm of mediastinum: Secondary | ICD-10-CM | POA: Insufficient documentation

## 2017-10-23 DIAGNOSIS — C539 Malignant neoplasm of cervix uteri, unspecified: Secondary | ICD-10-CM

## 2017-10-23 DIAGNOSIS — C787 Secondary malignant neoplasm of liver and intrahepatic bile duct: Secondary | ICD-10-CM | POA: Diagnosis not present

## 2017-10-23 DIAGNOSIS — Z79899 Other long term (current) drug therapy: Secondary | ICD-10-CM | POA: Diagnosis not present

## 2017-10-23 DIAGNOSIS — R5383 Other fatigue: Secondary | ICD-10-CM

## 2017-10-23 DIAGNOSIS — C7802 Secondary malignant neoplasm of left lung: Secondary | ICD-10-CM | POA: Diagnosis not present

## 2017-10-23 DIAGNOSIS — E46 Unspecified protein-calorie malnutrition: Secondary | ICD-10-CM

## 2017-10-23 DIAGNOSIS — Z9221 Personal history of antineoplastic chemotherapy: Secondary | ICD-10-CM | POA: Diagnosis not present

## 2017-10-23 DIAGNOSIS — C799 Secondary malignant neoplasm of unspecified site: Secondary | ICD-10-CM

## 2017-10-23 DIAGNOSIS — C7989 Secondary malignant neoplasm of other specified sites: Secondary | ICD-10-CM

## 2017-10-23 LAB — CMP (CANCER CENTER ONLY)
ALT: 35 U/L (ref 0–44)
ANION GAP: 9 (ref 5–15)
AST: 53 U/L — AB (ref 15–41)
Albumin: 3.1 g/dL — ABNORMAL LOW (ref 3.5–5.0)
Alkaline Phosphatase: 255 U/L — ABNORMAL HIGH (ref 38–126)
BUN: 26 mg/dL — AB (ref 8–23)
CHLORIDE: 105 mmol/L (ref 98–111)
CO2: 25 mmol/L (ref 22–32)
Calcium: 9.3 mg/dL (ref 8.9–10.3)
Creatinine: 1.65 mg/dL — ABNORMAL HIGH (ref 0.44–1.00)
GFR, EST AFRICAN AMERICAN: 35 mL/min — AB (ref >60)
GFR, EST NON AFRICAN AMERICAN: 30 mL/min — AB (ref >60)
Glucose, Bld: 131 mg/dL — ABNORMAL HIGH (ref 70–99)
Potassium: 4.4 mmol/L (ref 3.5–5.1)
Sodium: 139 mmol/L (ref 135–145)
TOTAL PROTEIN: 8 g/dL (ref 6.5–8.1)
Total Bilirubin: 0.5 mg/dL (ref 0.3–1.2)

## 2017-10-23 MED ORDER — SODIUM CHLORIDE 0.9% FLUSH
10.0000 mL | Freq: Once | INTRAVENOUS | Status: AC
  Administered 2017-10-23: 10 mL
  Filled 2017-10-23: qty 10

## 2017-10-23 MED ORDER — SODIUM CHLORIDE 0.9 % IV SOLN
Freq: Once | INTRAVENOUS | Status: AC
  Administered 2017-10-23: 14:00:00 via INTRAVENOUS
  Filled 2017-10-23: qty 250

## 2017-10-23 MED ORDER — SODIUM CHLORIDE 0.9 % IV SOLN
Freq: Once | INTRAVENOUS | Status: DC
  Filled 2017-10-23: qty 250

## 2017-10-23 MED ORDER — HEPARIN SOD (PORK) LOCK FLUSH 100 UNIT/ML IV SOLN
500.0000 [IU] | Freq: Once | INTRAVENOUS | Status: AC
  Administered 2017-10-23: 500 [IU]
  Filled 2017-10-23: qty 5

## 2017-10-23 NOTE — Patient Instructions (Addendum)
Implanted Port Home Guide An implanted port is a type of central line that is placed under the skin. Central lines are used to provide IV access when treatment or nutrition needs to be given through a person's veins. Implanted ports are used for long-term IV access. An implanted port may be placed because:  You need IV medicine that would be irritating to the small veins in your hands or arms.  You need long-term IV medicines, such as antibiotics.  You need IV nutrition for a long period.  You need frequent blood draws for lab tests.  You need dialysis.  Implanted ports are usually placed in the chest area, but they can also be placed in the upper arm, the abdomen, or the leg. An implanted port has two main parts:  Reservoir. The reservoir is round and will appear as a small, raised area under your skin. The reservoir is the part where a needle is inserted to give medicines or draw blood.  Catheter. The catheter is a thin, flexible tube that extends from the reservoir. The catheter is placed into a large vein. Medicine that is inserted into the reservoir goes into the catheter and then into the vein.  How will I care for my incision site? Do not get the incision site wet. Bathe or shower as directed by your health care provider. How is my port accessed? Special steps must be taken to access the port:  Before the port is accessed, a numbing cream can be placed on the skin. This helps numb the skin over the port site.  Your health care provider uses a sterile technique to access the port. ? Your health care provider must put on a mask and sterile gloves. ? The skin over your port is cleaned carefully with an antiseptic and allowed to dry. ? The port is gently pinched between sterile gloves, and a needle is inserted into the port.  Only "non-coring" port needles should be used to access the port. Once the port is accessed, a blood return should be checked. This helps ensure that the port  is in the vein and is not clogged.  If your port needs to remain accessed for a constant infusion, a clear (transparent) bandage will be placed over the needle site. The bandage and needle will need to be changed every week, or as directed by your health care provider.  Keep the bandage covering the needle clean and dry. Do not get it wet. Follow your health care provider's instructions on how to take a shower or bath while the port is accessed.  If your port does not need to stay accessed, no bandage is needed over the port.  What is flushing? Flushing helps keep the port from getting clogged. Follow your health care provider's instructions on how and when to flush the port. Ports are usually flushed with saline solution or a medicine called heparin. The need for flushing will depend on how the port is used.  If the port is used for intermittent medicines or blood draws, the port will need to be flushed: ? After medicines have been given. ? After blood has been drawn. ? As part of routine maintenance.  If a constant infusion is running, the port may not need to be flushed.  How long will my port stay implanted? The port can stay in for as long as your health care provider thinks it is needed. When it is time for the port to come out, surgery will be   done to remove it. The procedure is similar to the one performed when the port was put in. When should I seek immediate medical care? When you have an implanted port, you should seek immediate medical care if:  You notice a bad smell coming from the incision site.  You have swelling, redness, or drainage at the incision site.  You have more swelling or pain at the port site or the surrounding area.  You have a fever that is not controlled with medicine.  This information is not intended to replace advice given to you by your health care provider. Make sure you discuss any questions you have with your health care provider. Document  Released: 02/19/2005 Document Revised: 07/28/2015 Document Reviewed: 10/27/2012 Elsevier Interactive Patient Education  2017 [employer]  Dehydration, Adult Dehydration is when there is not enough fluid or water in your body. This happens when you lose more fluids than you take in. Dehydration can range from mild to very bad. It should be treated right away to keep it from getting very bad. Symptoms of mild dehydration may include:  Thirst.  Dry lips.  Slightly dry mouth.  Dry, warm skin.  Dizziness. Symptoms of moderate dehydration may include:  Very dry mouth.  Muscle cramps.  Dark pee (urine). Pee may be the color of tea.  Your body making less pee.  Your eyes making fewer tears.  Heartbeat that is uneven or faster than normal (palpitations).  Headache.  Light-headedness, especially when you stand up from sitting.  Fainting (syncope). Symptoms of very bad dehydration may include:  Changes in skin, such as: ? Cold and clammy skin. ? Blotchy (mottled) or pale skin. ? Skin that does not quickly return to normal after being lightly pinched and let go (poor skin turgor).  Changes in body fluids, such as: ? Feeling very thirsty. ? Your eyes making fewer tears. ? Not sweating when body temperature is high, such as in hot weather. ? Your body making very little pee.  Changes in vital signs, such as: ? Weak pulse. ? Pulse that is more than 100 beats a minute when you are sitting still. ? Fast breathing. ? Low blood pressure.  Other changes, such as: ? Sunken eyes. ? Cold hands and feet. ? Confusion. ? Lack of energy (lethargy). ? Trouble waking up from sleep. ? Short-term weight loss. ? Unconsciousness. Follow these instructions at home:  If told by your doctor, drink an ORS: ? Make an ORS by using instructions on the package. ? Start by drinking small amounts, about  cup (120 mL) every 5-10 minutes. ? Slowly drink more until you have had the amount  that your doctor said to have.  Drink enough clear fluid to keep your pee clear or pale yellow. If you were told to drink an ORS, finish the ORS first, then start slowly drinking clear fluids. Drink fluids such as: ? Water. Do not drink only water by itself. Doing that can make the salt (sodium) level in your body get too low (hyponatremia). ? Ice chips. ? Fruit juice that you have added water to (diluted). ? Low-calorie sports drinks.  Avoid: ? Alcohol. ? Drinks that have a lot of sugar. These include high-calorie sports drinks, fruit juice that does not have water added, and soda. ? Caffeine. ? Foods that are greasy or have a lot of fat or sugar.  Take over-the-counter and prescription medicines only as told by your doctor.  Do not take salt tablets. Doing that   can make the salt level in your body get too high (hypernatremia).  Eat foods that have minerals (electrolytes). Examples include bananas, oranges, potatoes, tomatoes, and spinach.  Keep all follow-up visits as told by your doctor. This is important. Contact a doctor if:  You have belly (abdominal) pain that: ? Gets worse. ? Stays in one area (localizes).  You have a rash.  You have a stiff neck.  You get angry or annoyed more easily than normal (irritability).  You are more sleepy than normal.  You have a harder time waking up than normal.  You feel: ? Weak. ? Dizzy. ? Very thirsty.  You have peed (urinated) only a small amount of very dark pee during 6-8 hours. Get help right away if:  You have symptoms of very bad dehydration.  You cannot drink fluids without throwing up (vomiting).  Your symptoms get worse with treatment.  You have a fever.  You have a very bad headache.  You are throwing up or having watery poop (diarrhea) and it: ? Gets worse. ? Does not go away.  You have blood or something green (bile) in your throw-up.  You have blood in your poop (stool). This may cause poop to look  black and tarry.  You have not peed in 6-8 hours.  You pass out (faint).  Your heart rate when you are sitting still is more than 100 beats a minute.  You have trouble breathing. This information is not intended to replace advice given to you by your health care provider. Make sure you discuss any questions you have with your health care provider. Document Released: 12/16/2008 Document Revised: 09/09/2015 Document Reviewed: 04/15/2015 Elsevier Interactive Patient Education  2018 Elsevier Inc.  

## 2017-10-24 ENCOUNTER — Ambulatory Visit
Admission: RE | Admit: 2017-10-24 | Discharge: 2017-10-24 | Disposition: A | Discharge status: Home / Self Care | Payer: Medicare HMO | Source: Ambulatory Visit | Attending: Radiation Oncology | Admitting: Radiation Oncology

## 2017-10-24 ENCOUNTER — Ambulatory Visit: Payer: Medicare HMO

## 2017-10-24 DIAGNOSIS — C539 Malignant neoplasm of cervix uteri, unspecified: Secondary | ICD-10-CM | POA: Diagnosis not present

## 2017-10-24 DIAGNOSIS — Z510 Encounter for antineoplastic radiation therapy: Secondary | ICD-10-CM | POA: Diagnosis not present

## 2017-10-25 ENCOUNTER — Ambulatory Visit: Payer: Medicare HMO

## 2017-10-26 ENCOUNTER — Encounter (HOSPITAL_COMMUNITY): Payer: Self-pay

## 2017-10-26 ENCOUNTER — Inpatient Hospital Stay (HOSPITAL_COMMUNITY)
Admission: EM | Admit: 2017-10-26 | Discharge: 2017-10-30 | DRG: 872 | Disposition: A | Discharge status: Home / Self Care | Payer: Medicare HMO | Attending: Internal Medicine | Admitting: Internal Medicine

## 2017-10-26 ENCOUNTER — Other Ambulatory Visit: Payer: Self-pay

## 2017-10-26 ENCOUNTER — Emergency Department (HOSPITAL_COMMUNITY): Payer: Medicare HMO

## 2017-10-26 DIAGNOSIS — N3001 Acute cystitis with hematuria: Secondary | ICD-10-CM | POA: Diagnosis not present

## 2017-10-26 DIAGNOSIS — E860 Dehydration: Secondary | ICD-10-CM | POA: Diagnosis not present

## 2017-10-26 DIAGNOSIS — Z808 Family history of malignant neoplasm of other organs or systems: Secondary | ICD-10-CM

## 2017-10-26 DIAGNOSIS — Z933 Colostomy status: Secondary | ICD-10-CM

## 2017-10-26 DIAGNOSIS — A4151 Sepsis due to Escherichia coli [E. coli]: Secondary | ICD-10-CM | POA: Diagnosis not present

## 2017-10-26 DIAGNOSIS — M109 Gout, unspecified: Secondary | ICD-10-CM | POA: Diagnosis present

## 2017-10-26 DIAGNOSIS — N390 Urinary tract infection, site not specified: Secondary | ICD-10-CM | POA: Diagnosis not present

## 2017-10-26 DIAGNOSIS — Z6823 Body mass index (BMI) 23.0-23.9, adult: Secondary | ICD-10-CM

## 2017-10-26 DIAGNOSIS — Z87440 Personal history of urinary (tract) infections: Secondary | ICD-10-CM

## 2017-10-26 DIAGNOSIS — Z79899 Other long term (current) drug therapy: Secondary | ICD-10-CM

## 2017-10-26 DIAGNOSIS — A419 Sepsis, unspecified organism: Secondary | ICD-10-CM

## 2017-10-26 DIAGNOSIS — R6520 Severe sepsis without septic shock: Secondary | ICD-10-CM | POA: Diagnosis present

## 2017-10-26 DIAGNOSIS — C539 Malignant neoplasm of cervix uteri, unspecified: Secondary | ICD-10-CM | POA: Diagnosis present

## 2017-10-26 DIAGNOSIS — E872 Acidosis: Secondary | ICD-10-CM | POA: Diagnosis present

## 2017-10-26 DIAGNOSIS — E440 Moderate protein-calorie malnutrition: Secondary | ICD-10-CM | POA: Diagnosis not present

## 2017-10-26 DIAGNOSIS — K520 Gastroenteritis and colitis due to radiation: Secondary | ICD-10-CM | POA: Diagnosis present

## 2017-10-26 DIAGNOSIS — C7802 Secondary malignant neoplasm of left lung: Secondary | ICD-10-CM | POA: Diagnosis not present

## 2017-10-26 DIAGNOSIS — Z800 Family history of malignant neoplasm of digestive organs: Secondary | ICD-10-CM | POA: Diagnosis not present

## 2017-10-26 DIAGNOSIS — N184 Chronic kidney disease, stage 4 (severe): Secondary | ICD-10-CM | POA: Diagnosis present

## 2017-10-26 DIAGNOSIS — Y842 Radiological procedure and radiotherapy as the cause of abnormal reaction of the patient, or of later complication, without mention of misadventure at the time of the procedure: Secondary | ICD-10-CM | POA: Diagnosis present

## 2017-10-26 DIAGNOSIS — Z923 Personal history of irradiation: Secondary | ICD-10-CM

## 2017-10-26 DIAGNOSIS — Z801 Family history of malignant neoplasm of trachea, bronchus and lung: Secondary | ICD-10-CM

## 2017-10-26 DIAGNOSIS — M810 Age-related osteoporosis without current pathological fracture: Secondary | ICD-10-CM | POA: Diagnosis present

## 2017-10-26 DIAGNOSIS — R0602 Shortness of breath: Secondary | ICD-10-CM | POA: Diagnosis not present

## 2017-10-26 DIAGNOSIS — N183 Chronic kidney disease, stage 3 unspecified: Secondary | ICD-10-CM | POA: Diagnosis present

## 2017-10-26 DIAGNOSIS — C799 Secondary malignant neoplasm of unspecified site: Secondary | ICD-10-CM | POA: Diagnosis not present

## 2017-10-26 DIAGNOSIS — Z8541 Personal history of malignant neoplasm of cervix uteri: Secondary | ICD-10-CM

## 2017-10-26 DIAGNOSIS — C7801 Secondary malignant neoplasm of right lung: Secondary | ICD-10-CM | POA: Diagnosis present

## 2017-10-26 DIAGNOSIS — K219 Gastro-esophageal reflux disease without esophagitis: Secondary | ICD-10-CM | POA: Diagnosis present

## 2017-10-26 DIAGNOSIS — D649 Anemia, unspecified: Secondary | ICD-10-CM | POA: Diagnosis present

## 2017-10-26 DIAGNOSIS — R5383 Other fatigue: Secondary | ICD-10-CM | POA: Diagnosis not present

## 2017-10-26 DIAGNOSIS — Z66 Do not resuscitate: Secondary | ICD-10-CM | POA: Diagnosis not present

## 2017-10-26 DIAGNOSIS — E46 Unspecified protein-calorie malnutrition: Secondary | ICD-10-CM | POA: Diagnosis not present

## 2017-10-26 DIAGNOSIS — C787 Secondary malignant neoplasm of liver and intrahepatic bile duct: Secondary | ICD-10-CM | POA: Diagnosis present

## 2017-10-26 DIAGNOSIS — E876 Hypokalemia: Secondary | ICD-10-CM | POA: Diagnosis present

## 2017-10-26 DIAGNOSIS — R918 Other nonspecific abnormal finding of lung field: Secondary | ICD-10-CM | POA: Diagnosis not present

## 2017-10-26 DIAGNOSIS — N939 Abnormal uterine and vaginal bleeding, unspecified: Secondary | ICD-10-CM | POA: Diagnosis present

## 2017-10-26 DIAGNOSIS — Z9221 Personal history of antineoplastic chemotherapy: Secondary | ICD-10-CM | POA: Diagnosis not present

## 2017-10-26 DIAGNOSIS — N179 Acute kidney failure, unspecified: Secondary | ICD-10-CM | POA: Diagnosis not present

## 2017-10-26 DIAGNOSIS — R7881 Bacteremia: Secondary | ICD-10-CM | POA: Diagnosis not present

## 2017-10-26 LAB — URINALYSIS, ROUTINE W REFLEX MICROSCOPIC
Glucose, UA: NEGATIVE mg/dL
KETONES UR: NEGATIVE mg/dL
Nitrite: NEGATIVE
Protein, ur: 100 mg/dL — AB
Specific Gravity, Urine: 1.017 (ref 1.005–1.030)
pH: 5 (ref 5.0–8.0)

## 2017-10-26 LAB — CBC
HCT: 31.1 % — ABNORMAL LOW (ref 36.0–46.0)
Hemoglobin: 10.4 g/dL — ABNORMAL LOW (ref 12.0–15.0)
MCH: 27.8 pg (ref 26.0–34.0)
MCHC: 33.4 g/dL (ref 30.0–36.0)
MCV: 83.2 fL (ref 78.0–100.0)
PLATELETS: 150 10*3/uL (ref 150–400)
RBC: 3.74 MIL/uL — AB (ref 3.87–5.11)
RDW: 15.3 % (ref 11.5–15.5)
WBC: 13 10*3/uL — ABNORMAL HIGH (ref 4.0–10.5)

## 2017-10-26 LAB — DIFFERENTIAL
BASOS PCT: 0 %
Basophils Absolute: 0 10*3/uL (ref 0.0–0.1)
Eosinophils Absolute: 0 10*3/uL (ref 0.0–0.7)
Eosinophils Relative: 0 %
LYMPHS ABS: 0.5 10*3/uL — AB (ref 0.7–4.0)
Lymphocytes Relative: 4 %
MONO ABS: 0.5 10*3/uL (ref 0.1–1.0)
Monocytes Relative: 4 %
NEUTROS ABS: 12 10*3/uL — AB (ref 1.7–7.7)
NEUTROS PCT: 92 %

## 2017-10-26 LAB — COMPREHENSIVE METABOLIC PANEL
ALBUMIN: 2.7 g/dL — AB (ref 3.5–5.0)
ALT: 24 U/L (ref 0–44)
ANION GAP: 11 (ref 5–15)
AST: 33 U/L (ref 15–41)
Alkaline Phosphatase: 155 U/L — ABNORMAL HIGH (ref 38–126)
BUN: 47 mg/dL — AB (ref 8–23)
CHLORIDE: 106 mmol/L (ref 98–111)
CO2: 21 mmol/L — ABNORMAL LOW (ref 22–32)
Calcium: 8.6 mg/dL — ABNORMAL LOW (ref 8.9–10.3)
Creatinine, Ser: 2.46 mg/dL — ABNORMAL HIGH (ref 0.44–1.00)
GFR calc Af Amer: 22 mL/min — ABNORMAL LOW (ref >60)
GFR calc non Af Amer: 19 mL/min — ABNORMAL LOW (ref >60)
GLUCOSE: 145 mg/dL — AB (ref 70–99)
POTASSIUM: 4.8 mmol/L (ref 3.5–5.1)
Sodium: 138 mmol/L (ref 135–145)
Total Bilirubin: 1.4 mg/dL — ABNORMAL HIGH (ref 0.3–1.2)
Total Protein: 7.2 g/dL (ref 6.5–8.1)

## 2017-10-26 LAB — I-STAT CG4 LACTIC ACID, ED
LACTIC ACID, VENOUS: 2.45 mmol/L — AB (ref 0.5–1.9)
LACTIC ACID, VENOUS: 2.01 mmol/L — AB (ref 0.5–1.9)

## 2017-10-26 MED ORDER — SODIUM CHLORIDE 0.9 % IV SOLN
2.0000 g | INTRAVENOUS | Status: DC
  Filled 2017-10-26: qty 2

## 2017-10-26 MED ORDER — VANCOMYCIN HCL IN DEXTROSE 1-5 GM/200ML-% IV SOLN
1000.0000 mg | Freq: Once | INTRAVENOUS | Status: AC
  Administered 2017-10-26: 1000 mg via INTRAVENOUS
  Filled 2017-10-26: qty 200

## 2017-10-26 MED ORDER — HEPARIN SODIUM (PORCINE) 5000 UNIT/ML IJ SOLN
5000.0000 [IU] | Freq: Three times a day (TID) | INTRAMUSCULAR | Status: DC
  Administered 2017-10-28: 5000 [IU] via SUBCUTANEOUS
  Filled 2017-10-26 (×4): qty 1

## 2017-10-26 MED ORDER — SODIUM CHLORIDE 0.9% FLUSH
3.0000 mL | INTRAVENOUS | Status: DC | PRN
  Administered 2017-10-30: 3 mL via INTRAVENOUS
  Filled 2017-10-26: qty 3

## 2017-10-26 MED ORDER — SODIUM CHLORIDE 0.9 % IV SOLN: 250.0000 mL | INTRAVENOUS | Status: DC | PRN

## 2017-10-26 MED ORDER — VANCOMYCIN HCL IN DEXTROSE 750-5 MG/150ML-% IV SOLN: 750.0000 mg | INTRAVENOUS | Status: DC

## 2017-10-26 MED ORDER — SODIUM CHLORIDE 0.9% FLUSH
3.0000 mL | Freq: Two times a day (BID) | INTRAVENOUS | Status: DC
  Administered 2017-10-26: 3 mL via INTRAVENOUS

## 2017-10-26 MED ORDER — ONDANSETRON HCL 4 MG/2ML IJ SOLN
4.0000 mg | Freq: Four times a day (QID) | INTRAMUSCULAR | Status: DC | PRN
  Administered 2017-10-26: 4 mg via INTRAVENOUS
  Filled 2017-10-26: qty 2

## 2017-10-26 MED ORDER — LACTATED RINGERS IV BOLUS
1000.0000 mL | Freq: Once | INTRAVENOUS | Status: AC
  Administered 2017-10-26: 1000 mL via INTRAVENOUS

## 2017-10-26 MED ORDER — ACETAMINOPHEN 650 MG RE SUPP: 650.0000 mg | Freq: Four times a day (QID) | RECTAL | Status: DC | PRN

## 2017-10-26 MED ORDER — ONDANSETRON HCL 4 MG PO TABS: 4.0000 mg | ORAL_TABLET | Freq: Four times a day (QID) | ORAL | Status: DC | PRN

## 2017-10-26 MED ORDER — NYSTATIN 100000 UNIT/GM EX POWD
Freq: Three times a day (TID) | CUTANEOUS | Status: DC
  Administered 2017-10-26 – 2017-10-28 (×4): via TOPICAL
  Administered 2017-10-28: 1 via TOPICAL
  Administered 2017-10-29 – 2017-10-30 (×3): via TOPICAL
  Filled 2017-10-26: qty 15

## 2017-10-26 MED ORDER — PANTOPRAZOLE SODIUM 40 MG PO TBEC
40.0000 mg | DELAYED_RELEASE_TABLET | Freq: Every day | ORAL | Status: DC
  Administered 2017-10-26 – 2017-10-30 (×5): 40 mg via ORAL
  Filled 2017-10-26 (×4): qty 1

## 2017-10-26 MED ORDER — SODIUM CHLORIDE 0.9 % IV SOLN
2.0000 g | Freq: Once | INTRAVENOUS | Status: AC
  Administered 2017-10-26: 2 g via INTRAVENOUS
  Filled 2017-10-26: qty 2

## 2017-10-26 MED ORDER — ACETAMINOPHEN 325 MG PO TABS
650.0000 mg | ORAL_TABLET | Freq: Once | ORAL | Status: AC
  Administered 2017-10-26: 650 mg via ORAL
  Filled 2017-10-26: qty 2

## 2017-10-26 MED ORDER — SODIUM CHLORIDE 0.9 % IV SOLN
INTRAVENOUS | Status: DC
  Administered 2017-10-26 – 2017-10-28 (×5): via INTRAVENOUS

## 2017-10-26 MED ORDER — ACETAMINOPHEN 325 MG PO TABS
650.0000 mg | ORAL_TABLET | Freq: Four times a day (QID) | ORAL | Status: DC | PRN
  Administered 2017-10-30 (×2): 650 mg via ORAL
  Filled 2017-10-26 (×2): qty 2

## 2017-10-26 NOTE — ED Notes (Signed)
ED provider [facility] made aware patient has critical lactic value of 2.45

## 2017-10-26 NOTE — Progress Notes (Signed)
Pharmacy Antibiotic Note  Katherine Boone is a 72 y.o. female with cervical cancer on palliative chemo and XRT PTA, presented to the ED on 10/26/2017 with c/o weakness and n/v.  To start vancomycin and cefepime for sepsis.  -  scr 2.46 (crcl~20) - ANC 12   Plan: - vancomycin 1000 mg IV x1 given in the ED, then 750 mg IV q48h for est AUC 410 - cefepime 2 gm IV q24h - monitor renal function closely  ____________________________________  Height: 5\' 7"  (170.2 cm) Weight: 145 lb (65.8 kg) IBW/kg (Calculated) : 61.6  Temp (24hrs), Avg:100.4 F (38 C), Min:98.7 F (37.1 C), Max:102.1 F (38.9 C)  Recent Labs  Lab 10/22/17 1610 10/23/17 1115 10/26/17 1356 10/26/17 1402 10/26/17 1426 10/26/17 1532  WBC 10.2  --   --   --  13.0*  --   CREATININE  --  1.65* 2.46*  --   --   --   LATICACIDVEN  --   --   --  2.45*  --  2.01*    Estimated Creatinine Clearance: 20.4 mL/min (A) (by C-G formula based on SCr of 2.46 mg/dL (H)).    No Known Allergies   Thank you for allowing pharmacy to be a part of this patient's care.  Lynelle Doctor 10/26/2017 5:52 PM

## 2017-10-26 NOTE — ED Notes (Signed)
ED TO INPATIENT HANDOFF REPORT  Name/Age/Gender Katherine Boone 72 y.o. female  Code Status    Code Status Orders  (From admission, onward)         Start     Ordered   10/26/17 1658  Do not attempt resuscitation (DNR)  Continuous    Question Answer Comment  In the event of cardiac or respiratory ARREST Do not call a "code blue"   In the event of cardiac or respiratory ARREST Do not perform Intubation, CPR, defibrillation or ACLS   In the event of cardiac or respiratory ARREST Use medication by any route, position, wound care, and other measures to relive pain and suffering. May use oxygen, suction and manual treatment of airway obstruction as needed for comfort.      10/26/17 1657        Code Status History    Date Active Date Inactive Code Status Order ID Comments User Context   05/16/2015 0038 05/23/2015 1907 Full Code [id number]  Theressa Millard, MD Inpatient      Home/SNF/Other Home  Chief Complaint Ca pt; weakness; nausea  Level of Care/Admitting Diagnosis ED Disposition    ED Disposition Condition [facility] Area: [facility] [[id number]]  Level of Care: Med-Surg [16]  Diagnosis: Sepsis [facility]) [[id number]]  Admitting Physician: [place], [place]  Attending Physician: Debbe Odea [[id number]]  Estimated length of stay: past midnight tomorrow  Certification:: I certify this patient will need inpatient services for at least 2 midnights  PT Class (Do Not Modify): Inpatient [101]  PT Acc Code (Do Not Modify): Private [1]       Medical History Past Medical History:  Diagnosis Date  . Cancer (HCC)    Cervical  . Family history of cancer    stomach, colon, lung, throat  . GERD (gastroesophageal reflux disease)    diet controlled  . Gout 03/2005   ?  Marland Kitchen Large bowel obstruction ([place]) 05/28/2015  . Migraines    none recently  . Osteoporosis 02/2011   DEXA 12/12 - Lspine -2.6; femur -3.8  . Small bowel obstruction (HCC)      Allergies No Known Allergies  IV Location/Drains/Wounds Patient Lines/Drains/Airways Status   Active Line/Drains/Airways    Name:   Placement date:   Placement time:   Site:   Days:   Implanted Port 06/10/15 Right Chest   06/10/15    -    Chest   869   Colostomy LUQ   05/18/15    1200    LUQ   892   Ileostomy   -    -    -      Incision (Closed) 05/18/15 Abdomen Other (Comment)   05/18/15    1157     892   Incision - 3 Ports Abdomen Umbilicus Right;Mid Mid;Lower   05/18/15    1137     892          Labs/Imaging Results for orders placed or performed during the hospital encounter of 10/26/17 (from the past 48 hour(s))  Comprehensive metabolic panel     Status: Abnormal   Collection Time: 10/26/17  1:56 PM  Result Value Ref Range   Sodium 138 135 - 145 mmol/L   Potassium 4.8 3.5 - 5.1 mmol/L   Chloride 106 98 - 111 mmol/L   CO2 21 (L) 22 - 32 mmol/L   Glucose, Bld 145 (H) 70 - 99 mg/dL   BUN 47 (H) 8 -  23 mg/dL   Creatinine, Ser 2.46 (H) 0.44 - 1.00 mg/dL   Calcium 8.6 (L) 8.9 - 10.3 mg/dL   Total Protein 7.2 6.5 - 8.1 g/dL   Albumin 2.7 (L) 3.5 - 5.0 g/dL   AST 33 15 - 41 U/L   ALT 24 0 - 44 U/L   Alkaline Phosphatase 155 (H) 38 - 126 U/L   Total Bilirubin 1.4 (H) 0.3 - 1.2 mg/dL   GFR calc non Af Amer 19 (L) >60 mL/min   GFR calc Af Amer 22 (L) >60 mL/min    Comment: (NOTE) The eGFR has been calculated using the CKD EPI equation. This calculation has not been validated in all clinical situations. eGFR's persistently <60 mL/min signify possible Chronic Kidney Disease.    Anion gap 11 5 - 15    Comment: Performed at [facility], [place] [address], [place], [place] [address]  I-Stat CG4 Lactic Acid, ED  (not at  [facility])     Status: Abnormal   Collection Time: 10/26/17  2:02 PM  Result Value Ref Range   Lactic Acid, Venous 2.45 (HH) 0.5 - 1.9 mmol/L   Comment NOTIFIED PHYSICIAN   CBC     Status: Abnormal   Collection Time: 10/26/17  2:26 PM  Result  Value Ref Range   WBC 13.0 (H) 4.0 - 10.5 K/uL   RBC 3.74 (L) 3.87 - 5.11 MIL/uL   Hemoglobin 10.4 (L) 12.0 - 15.0 g/dL   HCT 31.1 (L) 36.0 - 46.0 %   MCV 83.2 78.0 - 100.0 fL   MCH 27.8 26.0 - 34.0 pg   MCHC 33.4 30.0 - 36.0 g/dL   RDW 15.3 11.5 - 15.5 %   Platelets 150 150 - 400 K/uL    Comment: Performed at [facility], [place] [address] [place], [place] [address]  Differential     Status: Abnormal   Collection Time: 10/26/17  2:26 PM  Result Value Ref Range   Neutrophils Relative % 92 %   Lymphocytes Relative 4 %   Monocytes Relative 4 %   Eosinophils Relative 0 %   Basophils Relative 0 %   Neutro Abs 12.0 (H) 1.7 - 7.7 K/uL   Lymphs Abs 0.5 (L) 0.7 - 4.0 K/uL   Monocytes Absolute 0.5 0.1 - 1.0 K/uL   Eosinophils Absolute 0.0 0.0 - 0.7 K/uL   Basophils Absolute 0.0 0.0 - 0.1 K/uL   RBC Morphology ELLIPTOCYTES     Comment: Performed at [facility], [place] [address], [place], [place] [address]  Urinalysis, Routine w reflex microscopic     Status: Abnormal   Collection Time: 10/26/17  3:21 PM  Result Value Ref Range   Color, Urine YELLOW YELLOW   APPearance TURBID (A) CLEAR   Specific Gravity, Urine 1.017 1.005 - 1.030   pH 5.0 5.0 - 8.0   Glucose, UA NEGATIVE NEGATIVE mg/dL   Hgb urine dipstick SMALL (A) NEGATIVE   Bilirubin Urine SMALL (A) NEGATIVE   Ketones, ur NEGATIVE NEGATIVE mg/dL   Protein, ur 100 (A) NEGATIVE mg/dL   Nitrite NEGATIVE NEGATIVE   Leukocytes, UA LARGE (A) NEGATIVE   RBC / HPF 21-50 0 - 5 RBC/hpf   WBC, UA >50 (H) 0 - 5 WBC/hpf   Bacteria, UA MANY (A) NONE SEEN   Squamous Epithelial / LPF 0-5 0 - 5   WBC Clumps PRESENT    Budding Yeast PRESENT     Comment: Performed at [facility] [place] [address], [place], [place] [address]  I-Stat CG4 Lactic Acid, ED  (not at  [facility])     Status: Abnormal   Collection Time: 10/26/17  3:32 PM  Result Value Ref Range   Lactic Acid, Venous 2.01 (HH) 0.5 - 1.9  mmol/L   Comment NOTIFIED PHYSICIAN    Dg Chest 2 View  Result Date: 10/26/2017 CLINICAL DATA:  71 year old with weakness, nausea and vomiting. Evaluate for sepsis. Patient has metastatic cervical cancer to the lungs. EXAM: CHEST - 2 VIEW COMPARISON:  Chest CT 09/13/2017 FINDINGS: Right jugular Port-A-Cath with the tip in the lower SVC. There are innumerable nodular densities throughout both lungs compatible with known metastatic disease. No large areas of lung consolidation or airspace disease. Heart and mediastinum are within normal limits. Trachea is midline. No large pleural effusions. Again noted is a compression fracture at T12. IMPRESSION: Innumerable pulmonary nodules and compatible with known metastatic disease. No new airspace disease or consolidation in the lungs. Electronically Signed   By: Markus Daft M.D.   On: 10/26/2017 15:17    Pending Labs Unresulted Labs (From admission, onward)    Start     Ordered   10/27/17 0500  Comprehensive metabolic panel  Tomorrow morning,   R     10/26/17 1657   10/27/17 0500  CBC  Tomorrow morning,   R     10/26/17 1657   10/26/17 1700  Gastrointestinal Panel by PCR , Stool  (Gastrointestinal Panel by PCR, Stool)  Once,   R     10/26/17 1659   10/26/17 1655  CBC  (heparin)  Once,   R    Comments:  Baseline for heparin therapy IF NOT ALREADY DRAWN.  Notify MD if PLT < 100 K.    10/26/17 1657   10/26/17 1655  Creatinine, serum  (heparin)  Once,   R    Comments:  Baseline for heparin therapy IF NOT ALREADY DRAWN.    10/26/17 1657   10/26/17 1615  Culture, Urine  STAT,   R     10/26/17 1614   10/26/17 1333  CBC WITH DIFFERENTIAL  STAT,   STAT     10/26/17 1333   10/26/17 1333  Blood Culture (routine x 2)  BLOOD CULTURE X 2,   STAT     10/26/17 1333          Vitals/Pain Today's Vitals   10/26/17 1415 10/26/17 1430 10/26/17 1553 10/26/17 1700  BP:  (!) 99/58  111/63  Pulse: (!) 127 (!) 122 (!) 103 99  Resp: (!) 38 (!) 29 (!) 21 (!) 21   Temp:      TempSrc:      SpO2: 96% 96% 98% 97%  Weight:      Height:      PainSc:        Isolation Precautions Enteric precautions (UV disinfection)  Medications Medications  pantoprazole (PROTONIX) EC tablet 40 mg (has no administration in time range)  heparin injection 5,000 Units (has no administration in time range)  sodium chloride flush (NS) 0.9 % injection 3 mL (has no administration in time range)  sodium chloride flush (NS) 0.9 % injection 3 mL (has no administration in time range)  0.9 %  sodium chloride infusion (has no administration in time range)  acetaminophen (TYLENOL) tablet 650 mg (has no administration in time range)    Or  acetaminophen (TYLENOL) suppository 650 mg (has no administration in time range)  ondansetron (ZOFRAN) tablet  4 mg (has no administration in time range)    Or  ondansetron (ZOFRAN) injection 4 mg (has no administration in time range)  0.9 %  sodium chloride infusion (has no administration in time range)  vancomycin (VANCOCIN) IVPB 1000 mg/200 mL premix (0 mg Intravenous Stopped 10/26/17 1728)  ceFEPIme (MAXIPIME) 2 g in sodium chloride 0.9 % 100 mL IVPB (0 g Intravenous Stopped 10/26/17 1534)  lactated ringers bolus 1,000 mL (0 mLs Intravenous Stopped 10/26/17 1534)  acetaminophen (TYLENOL) tablet 650 mg (650 mg Oral Given 10/26/17 1418)  lactated ringers bolus 1,000 mL (0 mLs Intravenous Stopped 10/26/17 1728)    Mobility walks

## 2017-10-26 NOTE — H&P (Signed)
History and Physical    Katherine Boone  ALP:[id number]  DOB: Dec 23, 1945  DOA: 10/26/2017 PCP: [name], MD   Patient coming from: home  Chief Complaint: weak and severe nausea  HPI: Katherine Boone is a 72 y.o. female with medical history of cervical cancer on palliative chemo and radiation, colostomy, CKD 4 who presents with nausea and weakness. In the ED she is found to have a fever of 102 but she has not been able to tell if she has had a fever at home. Her symptoms are going on for 1 wk now. Due to nausea, her husband states, she has not been drinking or eating and is very weak now.  She admits to a mild non productive cough but no other respiratory issues. She admits to watery diarrhea but no abdominal pain. No blood in stool. She is noted to have a positive UA and does have some pain when urinating. She has not noted any other change in urination. She is very incontinent and needs to wear pad. She states she had a UTI about 1 month ago as well.   ED Course: given Vancomycin, Cefepime and lactated ringers boluses  Review of Systems:  Mild weight loss All other systems reviewed and apart from HPI, are negative.  Past Medical History:  Diagnosis Date  . Cancer (HCC)    Cervical  . Family history of cancer    stomach, colon, lung, throat  . GERD (gastroesophageal reflux disease)    diet controlled  . Gout 03/2005   ?  Marland Kitchen Large bowel obstruction ([place]) 05/28/2015  . Migraines    none recently  . Osteoporosis 02/2011   DEXA 12/12 - Lspine -2.6; femur -3.8  . Small bowel obstruction [facility])     Past Surgical History:  Procedure Laterality Date  . APPENDECTOMY  1960  . LAPAROSCOPIC DIVERTED COLOSTOMY N/A 05/18/2015   Procedure: LAPAROSCOPIC DIVERTED LOOP COLOSTOMY ;  Surgeon: Leighton Ruff, MD;  Location: [facility];  Service: General;  Laterality: N/A;    Social History:   reports that she has never smoked. She has never used smokeless tobacco. She reports that she does not  drink alcohol or use drugs.  No Known Allergies  Family History  Problem Relation Age of Onset  . Heart disease Mother   . Hypertension Mother   . Cancer Mother 94       colon and stomach  . Cancer Brother        stomach and throat, smoker  . Hypertension Sister   . Cancer Brother        stomach and lung, smoker  . COPD Brother   . Cancer Father        stomach cancer  . Ovarian cancer Unknown        maternal neice  . Throat cancer Brother   . Stroke Sister   . Cancer Other        niece - stomach cancer  . Diabetes Neg Hx   . Coronary artery disease Neg Hx      Prior to Admission medications   Medication Sig Start Date End Date Taking? Authorizing Provider  lidocaine-prilocaine (EMLA) cream Apply 1 application topically as needed. 03/14/17  Yes Gorsuch, Ni, MD  omeprazole (PRILOSEC) 20 MG capsule TAKE 1 CAPSULE BY MOUTH EVERY DAY Patient taking differently: Take 20 mg by mouth as needed (acid reflux).  10/01/16  Yes [name], MD  ondansetron (ZOFRAN) 8 MG tablet Take 1 tablet (8 mg  total) by mouth every 8 (eight) hours as needed for nausea. 03/14/17  Yes Heath Lark, MD    Physical Exam: Wt Readings from Last 3 Encounters:  10/26/17 65.8 kg  09/19/17 71.1 kg  09/17/17 70.9 kg   Vitals:   10/26/17 1400 10/26/17 1415 10/26/17 1430 10/26/17 1553  BP: 115/65  (!) 99/58   Pulse: (!) 126 (!) 127 (!) 122 (!) 103  Resp: (!) 36 (!) 38 (!) 29 (!) 21  Temp:      TempSrc:      SpO2: 96% 96% 96% 98%  Weight:      Height:          Constitutional:  Calm & comfortable Eyes: PERRLA, lids and conjunctivae normal ENT:  Mucous membranes are dry Pharynx clear of exudate   Normal dentition.  Neck: Supple, no masses  Respiratory:  Clear to auscultation bilaterally  Normal respiratory effort.  Cardiovascular:  S1 & S2 heard, regular rate and rhythm No Murmurs Abdomen:  Non distended Tenderness in the suprapubic area, colostomy bag is empty No masses Bowel sounds  normal Extremities:  No clubbing / cyanosis No pedal edema No joint deformity    Skin:  No rashes, lesions or ulcers Neurologic:  AAO x 3 CN 2-12 grossly intact Sensation intact Strength 5/5 in all 4 extremities Psychiatric:  Normal Mood and affect    Labs on Admission: I have personally reviewed following labs and imaging studies  CBC: Recent Labs  Lab 10/22/17 1610 10/26/17 1426  WBC 10.2 13.0*  NEUTROABS 8.2* 12.0*  HGB 12.0 10.4*  HCT 36.6 31.1*  MCV 84.5 83.2  PLT 169 379   Basic Metabolic Panel: Recent Labs  Lab 10/23/17 1115 10/26/17 1356  NA 139 138  K 4.4 4.8  CL 105 106  CO2 25 21*  GLUCOSE 131* 145*  BUN 26* 47*  CREATININE 1.65* 2.46*  CALCIUM 9.3 8.6*   GFR: Estimated Creatinine Clearance: 20.4 mL/min (A) (by C-G formula based on SCr of 2.46 mg/dL (H)). Liver Function Tests: Recent Labs  Lab 10/23/17 1115 10/26/17 1356  AST 53* 33  ALT 35 24  ALKPHOS 255* 155*  BILITOT 0.5 1.4*  PROT 8.0 7.2  ALBUMIN 3.1* 2.7*   No results for input(s): LIPASE, AMYLASE in the last 168 hours. No results for input(s): AMMONIA in the last 168 hours. Coagulation Profile: No results for input(s): INR, PROTIME in the last 168 hours. Cardiac Enzymes: No results for input(s): CKTOTAL, CKMB, CKMBINDEX, TROPONINI in the last 168 hours. BNP (last 3 results) No results for input(s): PROBNP in the last 8760 hours. HbA1C: No results for input(s): HGBA1C in the last 72 hours. CBG: No results for input(s): GLUCAP in the last 168 hours. Lipid Profile: No results for input(s): CHOL, HDL, LDLCALC, TRIG, CHOLHDL, LDLDIRECT in the last 72 hours. Thyroid Function Tests: No results for input(s): TSH, T4TOTAL, FREET4, T3FREE, THYROIDAB in the last 72 hours. Anemia Panel: No results for input(s): VITAMINB12, FOLATE, FERRITIN, TIBC, IRON, RETICCTPCT in the last 72 hours. Urine analysis:    Component Value Date/Time   COLORURINE YELLOW 10/26/2017 1521   APPEARANCEUR  TURBID (A) 10/26/2017 1521   LABSPEC 1.017 10/26/2017 1521   PHURINE 5.0 10/26/2017 1521   GLUCOSEU NEGATIVE 10/26/2017 1521   HGBUR SMALL (A) 10/26/2017 1521   BILIRUBINUR SMALL (A) 10/26/2017 1521   BILIRUBINUR Negative 07/06/2014 1502   KETONESUR NEGATIVE 10/26/2017 1521   PROTEINUR 100 (A) 10/26/2017 1521   UROBILINOGEN 0.2 07/06/2014 1502  NITRITE NEGATIVE 10/26/2017 1521   LEUKOCYTESUR LARGE (A) 10/26/2017 1521   Sepsis Labs: @LABRCNTIP (procalcitonin:4,lacticidven:4) )No results found for this or any previous visit (from the past 240 hour(s)).   Radiological Exams on Admission: Dg Chest 2 View  Result Date: 10/26/2017 CLINICAL DATA:  72 year old with weakness, nausea and vomiting. Evaluate for sepsis. Patient has metastatic cervical cancer to the lungs. EXAM: CHEST - 2 VIEW COMPARISON:  Chest CT 09/13/2017 FINDINGS: Right jugular Port-A-Cath with the tip in the lower SVC. There are innumerable nodular densities throughout both lungs compatible with known metastatic disease. No large areas of lung consolidation or airspace disease. Heart and mediastinum are within normal limits. Trachea is midline. No large pleural effusions. Again noted is a compression fracture at T12. IMPRESSION: Innumerable pulmonary nodules and compatible with known metastatic disease. No new airspace disease or consolidation in the lungs. Electronically Signed   By: Markus Daft M.D.   On: 10/26/2017 15:17    EKG: Independently reviewed. Sinus tach at 136 bpm  Assessment/Plan Principal Problem:   Severe sepsis - lactic acidosis - at this point suspecting a urinary source as UA is grossly positive - she does have watery stool, nausea and supra pubic tenderness (which may be from radiation or UTI) but needs to be assess for C diff due to immune compromised state - stool studies sent -she has a mild cough but CXR is clear of infiltrates & lung are clear on exam -pulse ox however was 94% on room air>> she does  have lung metastasis which could account for this -  follow  - given 2 L of IVF boluses- I will continue IVF as her oral intake is poor - will continue Vanc and Cefepime - note Urine culture was added on by myself after antibiotics were given in ED (will hopefully be sent off the UA that was already ordered) - note : she previously grew out E coli from her Urine on 8/12 which was resistant to Amp/ Unasyn - f/u blood cultures and fever curve - Lactic acid level nearly normalized- will not recheck  Active Problems:   AKI (acute kidney injury) with mild metabolic acidosis, CKD 3 - IV hydration as this is likely prerenal due to poor oral intake- follow    Nausea/ poor oral intake and Protein calorie malnutrition / hypoalbuminemia - due to nausea, will start with clear liquids and antiemetics - will need to add supplements as diet is advanced  GERD - PPI    Metastatic Cervical cancer  /   Colostomy in place  - on palliative chemo with TAxol and radiation of the pelvis- she has had about 16 radiation treatments so far     Anemia  - appears chronic and stable when compared to prior labs   DVT prophylaxis: Heparin Code Status: DNR  Family Communication: husband  Disposition Plan:   Consults called: none  Admission status: admit to inpatient    Debbe Odea MD [facility] Pager: [email or web address] Password [email or web address] 7PM-7AM, please contact night-coverage   10/26/2017, 4:47 PM

## 2017-10-26 NOTE — ED Notes (Signed)
EDP [facility] notified of POC Lactic 2.01

## 2017-10-26 NOTE — ED Triage Notes (Signed)
Patient presented to ed with c/o weakness, nausea and vomiting. Patient state she been losing weight lately. Patient last had radiation on Thursday and she have been unable to get out of the bed due to generalized weakness.

## 2017-10-26 NOTE — ED Notes (Signed)
2 failed attempts at collecting labs. RN notified. Was only able to get CMP and Lactic

## 2017-10-26 NOTE — ED Provider Notes (Signed)
Emergency Department Provider Note   I have reviewed the triage vital signs and the nursing notes.   HISTORY  Chief Complaint No chief complaint on file.   HPI Katherine Boone is a 72 y.o. female with multiple medical problems documented below most notably for metastatic ovarian cancer for which she still receiving radiation for but had been to multiple rounds of chemo as well.  She comes in today with 3 to 4 days of progressively worsening symptoms.  She states worsening nausea, vomiting and just feeling bad.  She had chills since Thursday but has not checked her temperature at home but knows that she had a fever.  She is also become more tachypneic during that time with a intermittent cough that is nonproductive.  Vomit does not contain blood but most recently since she is been vomiting so much is been mostly mucus. Has had some malodorous urine. No abdominal pain but did have some increased liquid output into her ostomy but no blood or mucus. No other associated or modifying symptoms.    Past Medical History:  Diagnosis Date  . Cancer (HCC)    Cervical  . Family history of cancer    stomach, colon, lung, throat  . GERD (gastroesophageal reflux disease)    diet controlled  . Gout 03/2005   ?  Marland Kitchen Large bowel obstruction ([place]) 05/28/2015  . Migraines    none recently  . Osteoporosis 02/2011   DEXA 12/12 - Lspine -2.6; femur -3.8  . Small bowel obstruction [facility])     Patient Active Problem List   Diagnosis Date Noted  . Vaginal discharge 09/09/2017  . Cancer associated pain 07/04/2017  . Fistula, bladder 04/19/2017  . Dysuria 04/12/2017  . Cellulitis and abscess of foot 12/11/2016  . Immunocompromised ([place]) 12/11/2016  . Chronic kidney disease (CKD), stage IV (severe) ([place]) 11/22/2016  . Other fatigue 08/16/2016  . Pancytopenia, acquired ([place]) 06/14/2016  . Goals of care, counseling/discussion 05/22/2016  . Cervical cancer ([place]) 05/21/2016  . Chemotherapy-induced  peripheral neuropathy ([place]) 12/17/2015  . Vitamin D deficiency 12/16/2015  . Port catheter in place 06/25/2015  . Poor venous access 06/11/2015  . Colostomy in place [facility]) 06/11/2015  . Protein calorie malnutrition ([place]) 05/28/2015  . Metastatic cancer to pelvis ([place]) 05/28/2015  . Metastases to the liver ([place]) 05/28/2015  . Chemotherapy induced neutropenia ([place])   . Constipation by outlet obstruction 05/15/2015  . Malignant neoplasm metastatic to right lung ([place]) 05/06/2015  . Malignant neoplasm metastatic to left lung ([place]) 05/06/2015  . International Federation of Gynecology and Obstetrics (FIGO) stage IVB malignant neoplasm of cervix ([place]) 05/06/2015  . Pelvic mass in female   . Other constipation 04/26/2015  . Metastatic cancer ([place]) 04/26/2015  . Health maintenance examination 07/06/2014  . Advanced care planning/counseling discussion 07/06/2014  . Renal insufficiency 07/06/2014  . HLD (hyperlipidemia) 06/29/2014  . Osteoporosis 02/03/2011  . Urinary urgency 01/02/2011  . Medicare annual wellness visit, initial 01/02/2011  . Insomnia 01/02/2011  . GERD (gastroesophageal reflux disease)   . Family history of cancer     Past Surgical History:  Procedure Laterality Date  . APPENDECTOMY  1960  . LAPAROSCOPIC DIVERTED COLOSTOMY N/A 05/18/2015   Procedure: LAPAROSCOPIC DIVERTED LOOP COLOSTOMY ;  Surgeon: Leighton Ruff, MD;  Location: [facility];  Service: General;  Laterality: N/A;    Current Outpatient Rx  . Order #: [id number] Class: Historical Med  . Order #: [id number] Class: Print  . Order #: [id number] Class: Normal  . Order #:  [id number] Class: Normal  . Order #: [id number] Class: Normal  . Order #: [id number] Class: Normal  . Order #: [id number] Class: Historical Med    Allergies Patient has no known allergies.  Family History  Problem Relation Age of Onset  . Heart disease Mother   . Hypertension Mother   . Cancer Mother 26       colon and stomach  . Cancer Brother         stomach and throat, smoker  . Hypertension Sister   . Cancer Brother        stomach and lung, smoker  . COPD Brother   . Cancer Father        stomach cancer  . Ovarian cancer Unknown        maternal neice  . Throat cancer Brother   . Stroke Sister   . Cancer Other        niece - stomach cancer  . Diabetes Neg Hx   . Coronary artery disease Neg Hx     Social History Social History   Tobacco Use  . Smoking status: Never Smoker  . Smokeless tobacco: Never Used  Substance Use Topics  . Alcohol use: No  . Drug use: No    Review of Systems  All other systems negative except as documented in the HPI. All pertinent positives and negatives as reviewed in the HPI. ____________________________________________   PHYSICAL EXAM:  VITAL SIGNS: ED Triage Vitals  Enc Vitals Group     BP 10/26/17 1329 109/65     Pulse Rate 10/26/17 1329 (!) 135     Resp 10/26/17 1329 (!) 36     Temp 10/26/17 1329 (!) 102.1 F (38.9 C)     Temp Source 10/26/17 1329 Oral     SpO2 10/26/17 1329 94 %     Weight 10/26/17 1329 145 lb (65.8 kg)     Height 10/26/17 1329 5\' 7"  (1.702 m)    Constitutional: Alert and oriented. Well appearing and in no acute distress. Eyes: Conjunctivae are normal. PERRL. EOMI. Head: Atraumatic. Nose: No congestion/rhinnorhea. Mouth/Throat: Mucous membranes are dry.  Oropharynx non-erythematous. Neck: No stridor.  No meningeal signs.   Cardiovascular: tachycardic rate, regular rhythm. Good peripheral circulation. Grossly normal heart sounds.   Respiratory: tachypneic respiratory effort.  No retractions. Lungs diminished diffusely. Gastrointestinal: Soft and nontender. No distention. Ostomy bag with yellowish tan contents. Musculoskeletal: No lower extremity tenderness nor edema. No gross deformities of extremities. Neurologic:  Normal speech and language. No gross focal neurologic deficits are appreciated.  Skin:  Skin is warm, dry and intact. No rash  noted.   ____________________________________________   LABS (all labs ordered are listed, but only abnormal results are displayed)  Labs Reviewed  CULTURE, BLOOD (ROUTINE X 2)  CULTURE, BLOOD (ROUTINE X 2)  COMPREHENSIVE METABOLIC PANEL  CBC WITH DIFFERENTIAL/PLATELET  URINALYSIS, ROUTINE W REFLEX MICROSCOPIC  I-STAT CG4 LACTIC ACID, ED   ____________________________________________  EKG   EKG Interpretation  Date/Time:    Ventricular Rate:    PR Interval:    QRS Duration:   QT Interval:    QTC Calculation:   R Axis:     Text Interpretation:         ____________________________________________  RADIOLOGY  Dg Chest 2 View  Result Date: 10/26/2017 CLINICAL DATA:  72 year old with weakness, nausea and vomiting. Evaluate for sepsis. Patient has metastatic cervical cancer to the lungs. EXAM: CHEST - 2 VIEW COMPARISON:  Chest CT 09/13/2017 FINDINGS: Right jugular Port-A-Cath with the tip  in the lower SVC. There are innumerable nodular densities throughout both lungs compatible with known metastatic disease. No large areas of lung consolidation or airspace disease. Heart and mediastinum are within normal limits. Trachea is midline. No large pleural effusions. Again noted is a compression fracture at T12. IMPRESSION: Innumerable pulmonary nodules and compatible with known metastatic disease. No new airspace disease or consolidation in the lungs. Electronically Signed   By: Markus Daft M.D.   On: 10/26/2017 15:17    ____________________________________________   PROCEDURES  Procedure(s) performed:   Procedures  CRITICAL CARE Performed by: Merrily Pew Total critical care time: 35 minutes Critical care time was exclusive of separately billable procedures and treating other patients. Critical care was necessary to treat or prevent imminent or life-threatening deterioration. Critical care was time spent personally by me on the following activities: development of  treatment plan with patient and/or surrogate as well as nursing, discussions with consultants, evaluation of patient's response to treatment, examination of patient, obtaining history from patient or surrogate, ordering and performing treatments and interventions, ordering and review of laboratory studies, ordering and review of radiographic studies, pulse oximetry and re-evaluation of patient's condition.  ____________________________________________   INITIAL IMPRESSION / ASSESSMENT AND PLAN / ED COURSE  This patient meets SIRS Criteria and may be septic. SIRS = Systemic Inflammatory Response Syndrome  Best Practice Recommends:   Notify the nurse immediately to increase monitoring of patient.   The recent clinical data is shown below. Vitals:   10/26/17 1329  BP: 109/65  Pulse: (!) 135  Resp: (!) 36  Temp: (!) 102.1 F (38.9 C)  TempSrc: Oral  SpO2: 94%  Weight: 65.8 kg  Height: 5\' 7"  (1.702 m)   Unclear allergy for sepsis.  Multiple respiratory issues making pneumonia most likely but also with GI symptoms and malodorous urine.  Will give fluids, Vanco and cefepime pending work-up.  UA positive. BP stable, HR improving, lactate improving, no e/o severe sepsis and is clinically improving. Stable for admission.    Pertinent labs & imaging results that were available during my care of the patient were reviewed by me and considered in my medical decision making (see chart for details).  ____________________________________________  FINAL CLINICAL IMPRESSION(S) / ED DIAGNOSES  Final diagnoses:  None     MEDICATIONS GIVEN DURING THIS VISIT:  Medications  vancomycin (VANCOCIN) IVPB 1000 mg/200 mL premix (has no administration in time range)  ceFEPIme (MAXIPIME) 2 g in sodium chloride 0.9 % 100 mL IVPB (has no administration in time range)  lactated ringers bolus 1,000 mL (has no administration in time range)  acetaminophen (TYLENOL) tablet 650 mg (has no administration  in time range)     NEW OUTPATIENT MEDICATIONS STARTED DURING THIS VISIT:  New Prescriptions   No medications on file    Note:  This note was prepared with assistance of Dragon voice recognition software. Occasional wrong-word or sound-a-like substitutions may have occurred due to the inherent limitations of voice recognition software.   Avondre Richens, Corene Cornea, MD 10/27/17 [phone]

## 2017-10-26 NOTE — ED Notes (Signed)
Patient transported to X-ray 

## 2017-10-27 LAB — BLOOD CULTURE ID PANEL (REFLEXED)
ACINETOBACTER BAUMANNII: NOT DETECTED
CANDIDA ALBICANS: NOT DETECTED
Candida glabrata: NOT DETECTED
Candida krusei: NOT DETECTED
Candida parapsilosis: NOT DETECTED
Candida tropicalis: NOT DETECTED
Carbapenem resistance: NOT DETECTED
ENTEROBACTER CLOACAE COMPLEX: NOT DETECTED
ENTEROCOCCUS SPECIES: NOT DETECTED
ESCHERICHIA COLI: DETECTED — AB
Enterobacteriaceae species: DETECTED — AB
Haemophilus influenzae: NOT DETECTED
Klebsiella oxytoca: NOT DETECTED
Klebsiella pneumoniae: NOT DETECTED
LISTERIA MONOCYTOGENES: NOT DETECTED
NEISSERIA MENINGITIDIS: NOT DETECTED
PSEUDOMONAS AERUGINOSA: NOT DETECTED
Proteus species: NOT DETECTED
STREPTOCOCCUS AGALACTIAE: NOT DETECTED
STREPTOCOCCUS PNEUMONIAE: NOT DETECTED
STREPTOCOCCUS PYOGENES: NOT DETECTED
Serratia marcescens: NOT DETECTED
Staphylococcus aureus (BCID): NOT DETECTED
Staphylococcus species: NOT DETECTED
Streptococcus species: NOT DETECTED

## 2017-10-27 LAB — COMPREHENSIVE METABOLIC PANEL
ALT: 16 U/L (ref 0–44)
ANION GAP: 7 (ref 5–15)
AST: 25 U/L (ref 15–41)
Albumin: 2.1 g/dL — ABNORMAL LOW (ref 3.5–5.0)
Alkaline Phosphatase: 110 U/L (ref 38–126)
BUN: 42 mg/dL — ABNORMAL HIGH (ref 8–23)
CALCIUM: 7.7 mg/dL — AB (ref 8.9–10.3)
CHLORIDE: 112 mmol/L — AB (ref 98–111)
CO2: 21 mmol/L — ABNORMAL LOW (ref 22–32)
Creatinine, Ser: 1.84 mg/dL — ABNORMAL HIGH (ref 0.44–1.00)
GFR, EST AFRICAN AMERICAN: 31 mL/min — AB (ref >60)
GFR, EST NON AFRICAN AMERICAN: 26 mL/min — AB (ref >60)
Glucose, Bld: 100 mg/dL — ABNORMAL HIGH (ref 70–99)
Potassium: 3.6 mmol/L (ref 3.5–5.1)
SODIUM: 140 mmol/L (ref 135–145)
Total Bilirubin: 1.7 mg/dL — ABNORMAL HIGH (ref 0.3–1.2)
Total Protein: 5.6 g/dL — ABNORMAL LOW (ref 6.5–8.1)

## 2017-10-27 LAB — CBC
HCT: 25.7 % — ABNORMAL LOW (ref 36.0–46.0)
HEMOGLOBIN: 8.7 g/dL — AB (ref 12.0–15.0)
MCH: 28 pg (ref 26.0–34.0)
MCHC: 33.9 g/dL (ref 30.0–36.0)
MCV: 82.6 fL (ref 78.0–100.0)
PLATELETS: 107 10*3/uL — AB (ref 150–400)
RBC: 3.11 MIL/uL — ABNORMAL LOW (ref 3.87–5.11)
RDW: 15.8 % — ABNORMAL HIGH (ref 11.5–15.5)
WBC: 8.4 10*3/uL (ref 4.0–10.5)

## 2017-10-27 LAB — URINE CULTURE

## 2017-10-27 MED ORDER — SODIUM CHLORIDE 0.9 % IV SOLN
2.0000 g | INTRAVENOUS | Status: DC
  Administered 2017-10-27 – 2017-10-30 (×4): 2 g via INTRAVENOUS
  Filled 2017-10-27 (×4): qty 2

## 2017-10-27 MED ORDER — SODIUM CHLORIDE 0.9% FLUSH
10.0000 mL | Freq: Two times a day (BID) | INTRAVENOUS | Status: DC
  Administered 2017-10-27 – 2017-10-30 (×6): 10 mL via INTRAVENOUS

## 2017-10-27 MED ORDER — ONDANSETRON HCL 4 MG/2ML IJ SOLN
4.0000 mg | Freq: Three times a day (TID) | INTRAMUSCULAR | Status: DC
  Administered 2017-10-27 – 2017-10-29 (×6): 4 mg via INTRAVENOUS
  Filled 2017-10-27 (×6): qty 2

## 2017-10-27 MED ORDER — SACCHAROMYCES BOULARDII 250 MG PO CAPS
250.0000 mg | ORAL_CAPSULE | Freq: Two times a day (BID) | ORAL | Status: DC
  Administered 2017-10-27 – 2017-10-30 (×7): 250 mg via ORAL
  Filled 2017-10-27 (×7): qty 1

## 2017-10-27 NOTE — Progress Notes (Signed)
PROGRESS NOTE    Katherine Boone  ACZ:[id number] DOB: 1945/07/03 DOA: 10/26/2017 PCP: [name], MD   Brief Narrative: 72 y.o. female with medical history of cervical cancer on palliative chemo and radiation, colostomy, CKD 4 who presents with nausea and weakness. In the ED she is found to have a fever of 102 but she has not been able to tell if she has had a fever at home. Her symptoms are going on for 1 wk now. Due to nausea, her husband states, she has not been drinking or eating and is very weak now.  She admits to a mild non productive cough but no other respiratory issues. She admits to watery diarrhea but no abdominal pain. No blood in stool. She is noted to have a positive UA and does have some pain when urinating. She has not noted any other change in urination. She is very incontinent and needs to wear pad. She states she had a UTI about 1 month ago as well.   ED Course: given Vancomycin, Cefepime and lactated ringers boluses   Assessment & Plan:   Principal Problem:   Severe sepsis (HCC) Active Problems:   Protein calorie malnutrition (HCC)   Colostomy in place [facility])   Cervical cancer ([place])   Chronic kidney disease (CKD), stage IV (severe) ([place])   AKI (acute kidney injury) ([place])   Anemia   Sepsis ([place])  1]SEPSIS-ecoli bacteremia from urinary source.vanco dcd.continue cefepime.  Patient still continues to have loose watery diarrhea.  A sample was sent for C. difficile this morning.  Will add probiotics.  Patient reports feeling better this morning.  She continues to be nauseous but has had no vomiting since admission.  She will attempt to start clear liquids with antiemetics on board.  2]AKI-creatinine improved to 1.84 from 2.46 at the time of admission.  Slow IV hydration and clear liquids.  AKI secondary to decreased p.o. intake and ongoing diarrhea and loss of fluid.  3] metastatic cervical cancer status post chemo and radiation to the pelvis had 16 radiation  treatments.  Has been having urinary complaints as well as diarrhea for a long time.  She reports having diarrhea 4 weeks since her radiation has started.  This makes me think she probably does not have a C. difficile colitis at this time.    DVT prophylaxis:heparin Code Status:dnr Family Communication:none Disposition Plan:. tbd  Consultants: none  Procedures: none Antimicrobials cefepime  Subjective: Patient resting in bed continues to be nauseous denies vomiting continues to have loose watery stools some abdominal pain   Objective: Vitals:   10/26/17 1742 10/26/17 1805 10/26/17 2027 10/27/17 0442  BP:  109/71 108/73 94/62  Pulse:  (!) 105 97 97  Resp:  18 16 14   Temp: 98.7 F (37.1 C) 98.1 F (36.7 C) 97.8 F (36.6 C) 98.7 F (37.1 C)  TempSrc: Oral Oral Oral Oral  SpO2:  97% 98% 100%  Weight:  67.3 kg    Height:  5\' 7"  (1.702 m)      Intake/Output Summary (Last 24 hours) at 10/27/2017 0956 Last data filed at 10/27/2017 0600 Gross per 24 hour  Intake 4380.94 ml  Output -  Net 4380.94 ml   Filed Weights   10/26/17 1329 10/26/17 1805  Weight: 65.8 kg 67.3 kg    Examination:  General exam: Appears calm and comfortable  Respiratory system: Clear to auscultation. Respiratory effort normal. Cardiovascular system: S1 & S2 heard, RRR. No JVD, murmurs, rubs, gallops or clicks. No  pedal edema. Gastrointestinal system: Abdomen is nondistended, soft and tender. No organomegaly or masses felt. Normal bowel sounds heard.COLOSTOMY IN PLACE with liquid stool. Central nervous system: Alert and oriented. No focal neurological deficits. Extremities: Symmetric 5 x 5 power. Skin: No rashes, lesions or ulcers Psychiatry: Judgement and insight appear normal. Mood & affect appropriate.     Data Reviewed: I have personally reviewed following labs and imaging studies  CBC: Recent Labs  Lab 10/22/17 1610 10/26/17 1426 10/27/17 0615  WBC 10.2 13.0* 8.4  NEUTROABS 8.2* 12.0*   --   HGB 12.0 10.4* 8.7*  HCT 36.6 31.1* 25.7*  MCV 84.5 83.2 82.6  PLT 169 150 370*   Basic Metabolic Panel: Recent Labs  Lab 10/23/17 1115 10/26/17 1356 10/27/17 0615  NA 139 138 140  K 4.4 4.8 3.6  CL 105 106 112*  CO2 25 21* 21*  GLUCOSE 131* 145* 100*  BUN 26* 47* 42*  CREATININE 1.65* 2.46* 1.84*  CALCIUM 9.3 8.6* 7.7*   GFR: Estimated Creatinine Clearance: 27.3 mL/min (A) (by C-G formula based on SCr of 1.84 mg/dL (H)). Liver Function Tests: Recent Labs  Lab 10/23/17 1115 10/26/17 1356 10/27/17 0615  AST 53* 33 25  ALT 35 24 16  ALKPHOS 255* 155* 110  BILITOT 0.5 1.4* 1.7*  PROT 8.0 7.2 5.6*  ALBUMIN 3.1* 2.7* 2.1*   No results for input(s): LIPASE, AMYLASE in the last 168 hours. No results for input(s): AMMONIA in the last 168 hours. Coagulation Profile: No results for input(s): INR, PROTIME in the last 168 hours. Cardiac Enzymes: No results for input(s): CKTOTAL, CKMB, CKMBINDEX, TROPONINI in the last 168 hours. BNP (last 3 results) No results for input(s): PROBNP in the last 8760 hours. HbA1C: No results for input(s): HGBA1C in the last 72 hours. CBG: No results for input(s): GLUCAP in the last 168 hours. Lipid Profile: No results for input(s): CHOL, HDL, LDLCALC, TRIG, CHOLHDL, LDLDIRECT in the last 72 hours. Thyroid Function Tests: No results for input(s): TSH, T4TOTAL, FREET4, T3FREE, THYROIDAB in the last 72 hours. Anemia Panel: No results for input(s): VITAMINB12, FOLATE, FERRITIN, TIBC, IRON, RETICCTPCT in the last 72 hours. Sepsis Labs: Recent Labs  Lab 10/26/17 1402 10/26/17 1532  LATICACIDVEN 2.45* 2.01*    Recent Results (from the past 240 hour(s))  Blood Culture (routine x 2)     Status: None (Preliminary result)   Collection Time: 10/26/17  2:11 PM  Result Value Ref Range Status   Specimen Description   Final    BLOOD RIGHT ANTECUBITAL Performed at [facility] [address] [place], [place] [address]      Special Requests   Final    BOTTLES DRAWN AEROBIC AND ANAEROBIC Blood Culture results may not be optimal due to an excessive volume of blood received in culture bottles Performed at [place] [facility] [address] [place], [place] [address]    Culture  Setup Time   Final    GRAM NEGATIVE RODS IN BOTH AEROBIC AND ANAEROBIC BOTTLES Organism ID to follow CRITICAL RESULT CALLED TO, READ BACK BY AND VERIFIED WITH: N.GOOGOVAC,PHARMD AT 0388 ON 10/27/17 BY G.MCADOO Performed at [facility], [place] [address] [place], [place] [address]    Culture PENDING  Incomplete   Report Status PENDING  Incomplete  Blood Culture ID Panel (Reflexed)     Status: Abnormal   Collection Time: 10/26/17  2:11 PM  Result Value Ref Range Status   Enterococcus species NOT DETECTED  NOT DETECTED Final   Listeria monocytogenes NOT DETECTED NOT DETECTED Final   Staphylococcus species NOT DETECTED NOT DETECTED Final   Staphylococcus aureus NOT DETECTED NOT DETECTED Final   Streptococcus species NOT DETECTED NOT DETECTED Final   Streptococcus agalactiae NOT DETECTED NOT DETECTED Final   Streptococcus pneumoniae NOT DETECTED NOT DETECTED Final   Streptococcus pyogenes NOT DETECTED NOT DETECTED Final   Acinetobacter baumannii NOT DETECTED NOT DETECTED Final   Enterobacteriaceae species DETECTED (A) NOT DETECTED Final    Comment: Enterobacteriaceae represent a large family of gram-negative bacteria, not a single organism. CRITICAL RESULT CALLED TO, READ BACK BY AND VERIFIED WITH: N.GOOGOVAC,PHARMD AT 0174 ON 10/27/17 BY G.MCADOO    Enterobacter cloacae complex NOT DETECTED NOT DETECTED Final   Escherichia coli DETECTED (A) NOT DETECTED Final    Comment: CRITICAL RESULT CALLED TO, READ BACK BY AND VERIFIED WITH: N.GOOGOVAC,PHARMD AT 9449 ON 10/27/17 BY G.MCADOO    Klebsiella oxytoca NOT DETECTED NOT DETECTED Final   Klebsiella pneumoniae NOT DETECTED NOT DETECTED Final   Proteus species NOT  DETECTED NOT DETECTED Final   Serratia marcescens NOT DETECTED NOT DETECTED Final   Carbapenem resistance NOT DETECTED NOT DETECTED Final   Haemophilus influenzae NOT DETECTED NOT DETECTED Final   Neisseria meningitidis NOT DETECTED NOT DETECTED Final   Pseudomonas aeruginosa NOT DETECTED NOT DETECTED Final   Candida albicans NOT DETECTED NOT DETECTED Final   Candida glabrata NOT DETECTED NOT DETECTED Final   Candida krusei NOT DETECTED NOT DETECTED Final   Candida parapsilosis NOT DETECTED NOT DETECTED Final   Candida tropicalis NOT DETECTED NOT DETECTED Final    Comment: Performed at [facility], [place] [address] [place], [place] [address]  Blood Culture (routine x 2)     Status: None (Preliminary result)   Collection Time: 10/26/17  2:25 PM  Result Value Ref Range Status   Specimen Description   Final    BLOOD RIGHT PORTA CATH Performed at [facility] [address] [place], [place] [address]    Special Requests   Final    BOTTLES DRAWN AEROBIC AND ANAEROBIC Blood Culture adequate volume Performed at [facility] [address] [place], [place] [address]    Culture  Setup Time   Final    GRAM NEGATIVE RODS IN BOTH AEROBIC AND ANAEROBIC BOTTLES CRITICAL VALUE NOTED.  VALUE IS CONSISTENT WITH PREVIOUSLY REPORTED AND CALLED VALUE. Performed at [facility], [place] [address], [place], [place] [address]    Culture PENDING  Incomplete   Report Status PENDING  Incomplete         Radiology Studies: Dg Chest 2 View  Result Date: 10/26/2017 CLINICAL DATA:  72 year old with weakness, nausea and vomiting. Evaluate for sepsis. Patient has metastatic cervical cancer to the lungs. EXAM: CHEST - 2 VIEW COMPARISON:  Chest CT 09/13/2017 FINDINGS: Right jugular Port-A-Cath with the tip in the lower SVC. There are innumerable nodular densities throughout both lungs compatible with known metastatic disease. No large areas of lung  consolidation or airspace disease. Heart and mediastinum are within normal limits. Trachea is midline. No large pleural effusions. Again noted is a compression fracture at T12. IMPRESSION: Innumerable pulmonary nodules and compatible with known metastatic disease. No new airspace disease or consolidation in the lungs. Electronically Signed   By: Markus Daft M.D.   On: 10/26/2017 15:17        Scheduled Meds: . heparin  5,000 Units Subcutaneous Q8H  . nystatin  Topical TID  . pantoprazole  40 mg Oral Daily  . sodium chloride flush  3 mL Intravenous Q12H   Continuous Infusions: . sodium chloride    . sodium chloride 125 mL/hr at 10/27/17 0600  . ceFEPime (MAXIPIME) IV       LOS: 1 day     Georgette Shell, MD [facility]   If 7PM-7AM, please contact night-coverage [email or web address] Password [facility] 10/27/2017, 9:56 AM

## 2017-10-27 NOTE — Progress Notes (Signed)
PHARMACY - PHYSICIAN COMMUNICATION CRITICAL VALUE ALERT - BLOOD CULTURE IDENTIFICATION (BCID)  Katherine Boone is an 72 y.o. female who presented to [facility] on 10/26/2017 with a chief complaint of weakness and severe nausea  Assessment:  Severe sepsis (include suspected source if known)  Name of physician (or Provider) Contacted: Dr. Rodena Piety  Current antibiotics: vancomycin and cefepime  Changes to prescribed antibiotics recommended:  Recommendations accepted by provider   - Continue cefepime, stop vancomycin   Results for orders placed or performed during the hospital encounter of 10/26/17  Blood Culture ID Panel (Reflexed) (Collected: 10/26/2017  2:11 PM)  Result Value Ref Range   Enterococcus species NOT DETECTED NOT DETECTED   Listeria monocytogenes NOT DETECTED NOT DETECTED   Staphylococcus species NOT DETECTED NOT DETECTED   Staphylococcus aureus NOT DETECTED NOT DETECTED   Streptococcus species NOT DETECTED NOT DETECTED   Streptococcus agalactiae NOT DETECTED NOT DETECTED   Streptococcus pneumoniae NOT DETECTED NOT DETECTED   Streptococcus pyogenes NOT DETECTED NOT DETECTED   Acinetobacter baumannii NOT DETECTED NOT DETECTED   Enterobacteriaceae species DETECTED (A) NOT DETECTED   Enterobacter cloacae complex NOT DETECTED NOT DETECTED   Escherichia coli DETECTED (A) NOT DETECTED   Klebsiella oxytoca NOT DETECTED NOT DETECTED   Klebsiella pneumoniae NOT DETECTED NOT DETECTED   Proteus species NOT DETECTED NOT DETECTED   Serratia marcescens NOT DETECTED NOT DETECTED   Carbapenem resistance NOT DETECTED NOT DETECTED   Haemophilus influenzae NOT DETECTED NOT DETECTED   Neisseria meningitidis NOT DETECTED NOT DETECTED   Pseudomonas aeruginosa NOT DETECTED NOT DETECTED   Candida albicans NOT DETECTED NOT DETECTED   Candida glabrata NOT DETECTED NOT DETECTED   Candida krusei NOT DETECTED NOT DETECTED   Candida parapsilosis NOT DETECTED NOT DETECTED   Candida tropicalis NOT  DETECTED NOT DETECTED     Royetta Asal, PharmD, BCPS Pager [phone] 10/27/2017 7:29 AM

## 2017-10-28 ENCOUNTER — Inpatient Hospital Stay: Payer: Medicare HMO

## 2017-10-28 ENCOUNTER — Ambulatory Visit: Payer: Medicare HMO

## 2017-10-28 DIAGNOSIS — R5383 Other fatigue: Secondary | ICD-10-CM

## 2017-10-28 DIAGNOSIS — Z9221 Personal history of antineoplastic chemotherapy: Secondary | ICD-10-CM

## 2017-10-28 DIAGNOSIS — Z66 Do not resuscitate: Secondary | ICD-10-CM

## 2017-10-28 DIAGNOSIS — C799 Secondary malignant neoplasm of unspecified site: Secondary | ICD-10-CM

## 2017-10-28 DIAGNOSIS — E860 Dehydration: Secondary | ICD-10-CM

## 2017-10-28 DIAGNOSIS — R0602 Shortness of breath: Secondary | ICD-10-CM

## 2017-10-28 DIAGNOSIS — R7881 Bacteremia: Secondary | ICD-10-CM

## 2017-10-28 DIAGNOSIS — N390 Urinary tract infection, site not specified: Secondary | ICD-10-CM

## 2017-10-28 LAB — CBC
HEMATOCRIT: 27.2 % — AB (ref 36.0–46.0)
Hemoglobin: 8.9 g/dL — ABNORMAL LOW (ref 12.0–15.0)
MCH: 27.1 pg (ref 26.0–34.0)
MCHC: 32.7 g/dL (ref 30.0–36.0)
MCV: 82.9 fL (ref 78.0–100.0)
Platelets: 124 10*3/uL — ABNORMAL LOW (ref 150–400)
RBC: 3.28 MIL/uL — ABNORMAL LOW (ref 3.87–5.11)
RDW: 15.8 % — AB (ref 11.5–15.5)
WBC: 6.7 10*3/uL (ref 4.0–10.5)

## 2017-10-28 LAB — GASTROINTESTINAL PANEL BY PCR, STOOL (REPLACES STOOL CULTURE)

## 2017-10-28 LAB — BASIC METABOLIC PANEL
Anion gap: 5 (ref 5–15)
BUN: 30 mg/dL — AB (ref 8–23)
CO2: 23 mmol/L (ref 22–32)
Calcium: 8.1 mg/dL — ABNORMAL LOW (ref 8.9–10.3)
Chloride: 113 mmol/L — ABNORMAL HIGH (ref 98–111)
Creatinine, Ser: 1.48 mg/dL — ABNORMAL HIGH (ref 0.44–1.00)
GFR calc Af Amer: 40 mL/min — ABNORMAL LOW (ref >60)
GFR calc non Af Amer: 34 mL/min — ABNORMAL LOW (ref >60)
Glucose, Bld: 93 mg/dL (ref 70–99)
POTASSIUM: 3.4 mmol/L — AB (ref 3.5–5.1)
SODIUM: 141 mmol/L (ref 135–145)

## 2017-10-28 MED ORDER — BOOST / RESOURCE BREEZE PO LIQD CUSTOM
1.0000 | Freq: Three times a day (TID) | ORAL | Status: DC
  Administered 2017-10-28 – 2017-10-29 (×4): 1 via ORAL

## 2017-10-28 MED ORDER — POTASSIUM CHLORIDE CRYS ER 20 MEQ PO TBCR
40.0000 meq | EXTENDED_RELEASE_TABLET | Freq: Once | ORAL | Status: AC
  Administered 2017-10-28: 40 meq via ORAL
  Filled 2017-10-28: qty 2

## 2017-10-28 NOTE — Progress Notes (Signed)
Katherine Boone   DOB:13-Sep-1945   BO#:[id number]    Assessment & Plan:  Metastatic cervical cancer She has decline in performance status I recommend she completes her palliative radiation therapy Due to her poor performance status, I do not believe she would be strong enough for future palliative chemotherapy I recommend continue aggressive supportive care  Recurrent UTI/bacteremia She has responded well to intravenous antibiotic treatment Recommend we continue the same  Severe anemia Multifactorial, likely due to recent infection, bone marrow suppression from antibiotics treatment and side effects of radiation therapy with ongoing vaginal bleeding She is symptomatic I recommend blood transfusion to keep hemoglobin greater than 8  Acute on chronic renal failure Secondary to dehydration and recent sepsis It is improving with IV fluids.  Continue the same  Significant fatigue and shortness of breath Chest x-ray showed significant pulmonary disease and she is anemic Continue supportive care  CODE STATUS DNR  Discharge planning I recommend a few more days of intravenous antibiotics to ensure clearance of bacteremia before discharge, hopefully by the end of the week.  I recommend she completes her radiation therapy if possible while she is hospitalized I will follow   Heath Lark, MD 10/28/2017  4:12 PM   Subjective:  The patient is well-known to me.   The patient has been going through palliative radiation therapy due to chronic vaginal bleeding from recurrent cervical cancer.  I was informed she has been admitted due to acute renal failure and bacteremia secondary to UTI.   She was treated for recurrent UTI approximately 2 weeks ago with oral antibiotics Prior to admission, she has been complaining of high-grade fever and chills.  She had some nausea and vomiting.  She has poor oral intake and have lost some weight  Since admission, she has received aggressive IV fluid  resuscitation and broad-spectrum IV antibiotics treatment.  Leukocytosis is improving and clinically, she felt better.  She had poor oral intake secondary to nausea prior to admission and that has improved with IV fluid resuscitation as well She continues to have chronic intermittent vaginal bleeding. She feels weak.  She has shortness of breath on moderate exertion  Oncology History   PD-L 1 testing neg     Metastatic cancer ([place])   04/26/2015 Initial Diagnosis    Metastatic cancer (H[facility])     Malignant neoplasm metastatic to right lung ([place])   05/06/2015 Initial Diagnosis    Malignant neoplasm metastatic to right lung [facility])     Malignant neoplasm metastatic to left lung ([place])   05/06/2015 Initial Diagnosis    Malignant neoplasm metastatic to left lung ([place])    09/17/2017 -  Chemotherapy    The patient had PACLitaxel (TAXOL) 144 mg in sodium chloride 0.9 % 250 mL chemo infusion (</= 80mg /m2), 80 mg/m2, Intravenous,  Once, 0 of 12 cycles  for chemotherapy treatment.      Cervical cancer ([place])   04/26/2015 Imaging    Numerous bilateral pulmonary nodules consistent with metastatic disease are associated with liver lesions, omental nodules, mesenteric nodules, necrotic retroperitoneal lymphadenopathy in a mixed cystic and solid mass in the central pelvis involving the uterus extending into the cul-de-sac and both adnexal regions. Uterine primary (likely endometrial) is favored over an ovarian primary malignancy given the lack of ascites, associated intrahepatic metastases and pulmonary involvement.    05/04/2015 Procedure    Technically successful CT-guided core biopsy of left pelvic mass    05/04/2015 Pathology Results    Soft Tissue Needle Core Biopsy,  left ext iliac mass METASTATIC HIGH GRADE POORLY DIFFERENTIATED CARCINOMA CONSISTENT WITH CERVICAL ORIGIN Microscopic Comment The neoplasm stains positive for cervical markers : p16, ER, ck8/18, and p63 (focal), TTF-1 (focal) and negative  for synaptophysin, Chromogranin (neuroendocrine markers) and ck5/6. The morphology and immunohistochemistry staining pattern support these cells are GYN cervical origin.Spec    05/10/2015 Imaging    Innumerable (> than 40) pulmonary nodules randomly distributed throughout both lungs, largest 1.1 cm, in keeping with pulmonary metastases. 2. Left supraclavicular and left hilar nodal metastases. 3. Re- demonstration of liver metastases. 4. Right thyroid lobe 1.4 cm pulmonary nodule, for which no further evaluation is recommended. 5. Stable mild superior T11 vertebral compression deformity.    05/12/2015 - 05/12/2015 Chemotherapy    She received 1 dose of carbo/taxol only    05/15/2015 Imaging    Widely metastatic cervical carcinoma with unchanged appearance compared to 04/26/2015. Bulky tumor narrows and likely invades the rectum/distal sigmoid with progressive proximal stool retention    05/15/2015 - 05/23/2015 Hospital Admission    She was admitted for severe bowel obstruction requiring urgent diversion colostomy    05/18/2015 Surgery    She underwent LAPAROSCOPIC DIVERTING SIGMOID LOOP COLOSTOMY for bowel obstruction     06/10/2015 Procedure    Successful placement of a right internal jugular approach power injectable Port-A-Cath. The catheter is ready for immediate use    06/23/2015 - 03/01/2016 Chemotherapy    She received combination treatment with cisplatin, Taxol and Avastin. Avastin was started on 5/11. Dose of Taxol was modified due to neuropathy and cisplatin was omited last dose due to 'weakness'    09/13/2015 Imaging    Significant interval treatment response. Pulmonary and liver metastases have significantly decreased in size. Thoracic adenopathy has decreased in size. Mixed attenuation mass encompassing the uterus and bilateral adnexal regions has significantly decreased in size. No residual visualized peritoneal tumor implants. No new or progressive metastatic disease. 2. No evidence of  bowel obstruction or acute bowel inflammation status post diverting sigmoid colostomy. 3. Additional findings include mild aortic atherosclerosis and Stable mild chronic superior T12 compression fracture.    12/14/2015 Imaging    Stable to improved interval exam. Multiple bilateral pulmonary nodules are stable in size to decreased and the liver lesions are stable to decreased. Mixed attenuation lesion encompassing the uterus and adnexal region shows decrease in size. There is no evidence for new or progressive disease on today's exam.    05/15/2016 Imaging    Findings today reflecting mixed interval response to therapy. 2. There has been mild increase in size of pulmonary nodules. 3. Interval increase in size of pelvic mass 4. Further regression of liver metastases 5. There is a new soft tissue nodule within the right lower quadrant peritoneal cavity. Suspicious peritoneal disease    06/07/2016 - 06/06/2017 Chemotherapy    She received chemo with cisplatin and gemzar    06/14/2016 Adverse Reaction    Cycle 1, day 8 is put on hold due to severe pancytopenia    08/29/2016 Imaging    Ct scan of chest, abdomen and pelvis: 1. General improvement, with reduced size of the scattered pulmonary nodules, and also a much more normal contour and appearance of the uterus, without the visible enhancing irregular mass shown on the 05/15/2016 exam involving the uterus and cervix. There still some mild soft tissue prominence of the cervix, and a cystic lesion of the left ovary as noted above. No pathologic adenopathy. 2. The right lower quadrant omental nodule has  considerably improved and nearly resolved. 3. The prior hepatic metastatic lesions have essentially resolved, with only minimal residual linear hypodensity in segment 4 at the site of the prior large metastatic lesion shown on 05/15/2015.  4. Other imaging findings of potential clinical significance: Left lower quadrant colostomy. Chronic superior endplate  compression at T11. Bony demineralization. Trace free pelvic fluid in the cul-de-sac. Central disc protrusion at L3-4. Left mid kidney hypodense lesion is likely a cyst.    12/12/2016 Imaging    1. Suspected minimal enlargement of at least 1 pulmonary nodule. The majority of pulmonary nodules are similar. Cannot exclude minimal pulmonary metastasis progression. 2. No evidence of extrathoracic progressive metastatic disease. Similar soft tissue fullness within the uterine cervix and similar to slight decrease in size of a tiny omental nodule. 3. Aortic Atherosclerosis (ICD10-I70.0). 4. Chronic T10 compression deformity    03/13/2017 Imaging    1. Interval progression of pre-existing pulmonary metastases with interval development of new pulmonary metastatic lesions. 2. Interval development of new and progression of pre-existing omental metastases. 3. Marked interval progression of cervical disease with dilated fluid-filled endometrial canal in the uterine fundus. The cervical disease infiltrates into the lower uterine segment and likely extends into the adnexal space bilaterally. Posterior extent of cervical disease is tethered to the distal sigmoid colon in this patient with a sigmoid loop colostomy. 4. Stable atrophy of the medial segment left liver.    03/22/2017 - 06/06/2017 Chemotherapy    The patient had chemo cisplatin and gemzar     04/26/2017 Imaging    1. Bladder wall irregularity anteriorly, suspicious for cystitis. No specific evidence of fistula to uterus or vagina. Voiding cystogram would likely be of greatest sensitivity. 2. Similar to slight decrease in size of infiltrative mass centered about the lower uterine segment. Presumed sigmoid colonic wall involvement.    07/09/2017 Imaging    Mild interval progression of diffuse bilateral pulmonary metastases since prior study.  Stable soft tissue mass involving the cervix, with hydrometros. Stable bilateral adnexal soft tissue densities  likely due to metastatic disease. No new or progressive metastatic disease identified within the abdomen or pelvis.  Mild benign-appearing compression fractures of T12 and L5 vertebral bodies, new since 03/13/2017 exam.    09/09/2017 Cancer Staging    Staging form: Cervix Uteri, AJCC 8th Edition - Clinical: Stage IVB (cTX, cN1, pM1) - Signed by Heath Lark, MD on 09/09/2017    09/13/2017 Imaging    1. Continued progression of pulmonary metastases with enlargement of previous nodules and development of new nodules. 2. New liver lesions measuring up to 16 mm, consistent with metastases. 3. New mediastinal lymphadenopathy consistent with metastatic disease. 4. New retroperitoneal lymphadenopathy in the abdomen. 5. Interval progression of abnormal soft tissue in the region of the cervix and in both adnexal regions. 6. No evidence of bowel obstruction or urinary obstruction.    Objective:  Vitals:   10/28/17 0456 10/28/17 1354  BP: 123/83 114/72  Pulse: 98 (!) 102  Resp: 16 16  Temp: 98.3 F (36.8 C) 98.6 F (37 C)  SpO2: 94% 97%     Intake/Output Summary (Last 24 hours) at 10/28/2017 1612 Last data filed at 10/28/2017 1100 Gross per 24 hour  Intake 2778.23 ml  Output -  Net 2778.23 ml    GENERAL:alert, no distress and comfortable.  She looks thin and pale SKIN: skin color is pale, texture, turgor are normal, no rashes or significant lesions EYES: normal, Conjunctiva are pale and non-injected, sclera clear  OROPHARYNX:no exudate, no erythema and lips, buccal mucosa, and tongue normal  NECK: supple, thyroid normal size, non-tender, without nodularity LYMPH:  no palpable lymphadenopathy in the cervical, axillary or inguinal LUNGS: clear to auscultation and percussion with normal breathing effort HEART: regular rate & rhythm and no murmurs and no lower extremity edema ABDOMEN:abdomen soft, distended, non-tender and normal bowel sounds Musculoskeletal:no cyanosis of digits and no  clubbing  NEURO: alert & oriented x 3 with fluent speech, no focal motor/sensory deficits   Labs:  Lab Results  Component Value Date   WBC 6.7 10/28/2017   HGB 8.9 (L) 10/28/2017   HCT 27.2 (L) 10/28/2017   MCV 82.9 10/28/2017   PLT 124 (L) 10/28/2017   NEUTROABS 12.0 (H) 10/26/2017    Lab Results  Component Value Date   NA 141 10/28/2017   K 3.4 (L) 10/28/2017   CL 113 (H) 10/28/2017   CO2 23 10/28/2017    Studies: I have personally reviewed her recent chest x-ray

## 2017-10-28 NOTE — Progress Notes (Signed)
PROGRESS NOTE    Katherine Boone  VOZ:[id number] DOB: 31-Mar-1945 DOA: 10/26/2017 PCP: [name], MD  Brief Ivor Costa 72-year-old female with medical history ofcervical cancer on palliative chemo and radiation,colostomy,CKD 4 who presents with nausea and weakness.In the ED she is found to have a fever of 102 but she has not been able to tell if she has had a fever at home. Her symptoms are going on for 1 wk now. Due to nausea, her husband states, she has not been drinking or eating and is very weak now.  She admits to a mild non productive cough but no other respiratory issues. She admits to watery diarrhea but no abdominal pain. No blood in stool. She is noted to have a positive UA and does have some pain when urinating. She has not noted any other change in urination. She is very incontinent and needs to wear pad. She states she had a UTI about 1 month ago as well.  ED Course:given Vancomycin, Cefepime and lactated ringers boluses  Assessment & Plan:   Principal Problem:   Severe sepsis (HCC) Active Problems:   Protein calorie malnutrition (HCC)   Colostomy in place [facility])   Cervical cancer (HCC)   Chronic kidney disease (CKD), stage IV (severe) (HCC)   AKI (acute kidney injury) ([place])   Anemia   Sepsis ([place])  1]SEPSIS-ecoli/enterobacter  bacteremia Sensitivity pending. Most likely from urinary source.vanco dcd.continue cefepime.  Patient still continues to have loose watery diarrhea.  A sample was sent for C. difficile STILL pending.  Will add probiotics.  Patient reports feeling better this morning. Still on clear liquids.advance diet.  2]AKI-creatinine improved to 1.48 from 2.46 at the time of admission.dc ivf.  AKI secondary to decreased p.o. intake and ongoing diarrhea and loss of fluid.  3] metastatic cervical cancer status post chemo and radiation to the pelvis had 16 radiation treatments.  Has been having urinary complaints as well as diarrhea for a long time.  She reports  having diarrhea 4 weeks since her radiation has started.  This makes me think she probably does not have a C. difficile colitis at this time.  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  But she has not had diarrhea.  Diarrhea started after radiation has been started.  5] hypokalemia due to diarrhea -replete   6]anemia hb drop from 12 to 8.9 due to hemodilution no signs of bleeding.stool liquid and brown.     DVT prophylaxis: heparin Code Status:dnr Family Communication:none Disposition Plan:pending final blood culture and stool studies. Consultants: none  Procedures:none Antimicrobials:cefepime  Subjective: Still with loose watery  bowel movements,nausea better..  Objective: Vitals:   10/27/17 0442 10/27/17 1335 10/27/17 2023 10/28/17 0456  BP: 94/62 124/76 120/77 123/83  Pulse: 97 (!) 104 (!) 103 98  Resp: 14 16 14 16   Temp: 98.7 F (37.1 C) 98.2 F (36.8 C) 98.8 F (37.1 C) 98.3 F (36.8 C)  TempSrc: Oral Oral Oral Oral  SpO2: 100% 99% 100% 94%  Weight:      Height:        Intake/Output Summary (Last 24 hours) at 10/28/2017 1032 Last data filed at 10/28/2017 0955 Gross per 24 hour  Intake 3840.85 ml  Output -  Net 3840.85 ml   Filed Weights   10/26/17 1329 10/26/17 1805  Weight: 65.8 kg 67.3 kg    Examination:  General exam: Appears calm and comfortable  Respiratory system: Clear to auscultation. Respiratory effort normal. Cardiovascular system: S1 & S2 heard, RRR.  No JVD, murmurs, rubs, gallops or clicks. No pedal edema. Gastrointestinal system: Abdomen is nondistended, soft and nontender. No organomegaly or masses felt. Normal bowel sounds heard.colostomy with liquid stools. Central nervous system: Alert and oriented. No focal neurological deficits. Extremities: Symmetric 5 x 5 power. Skin: No rashes, lesions or ulcers Psychiatry: Judgement and insight appear normal. Mood & affect appropriate.     Data Reviewed: I have  personally reviewed following labs and imaging studies  CBC: Recent Labs  Lab 10/22/17 1610 10/26/17 1426 10/27/17 0615 10/28/17 0538  WBC 10.2 13.0* 8.4 6.7  NEUTROABS 8.2* 12.0*  --   --   HGB 12.0 10.4* 8.7* 8.9*  HCT 36.6 31.1* 25.7* 27.2*  MCV 84.5 83.2 82.6 82.9  PLT 169 150 107* 607*   Basic Metabolic Panel: Recent Labs  Lab 10/23/17 1115 10/26/17 1356 10/27/17 0615 10/28/17 0538  NA 139 138 140 141  K 4.4 4.8 3.6 3.4*  CL 105 106 112* 113*  CO2 25 21* 21* 23  GLUCOSE 131* 145* 100* 93  BUN 26* 47* 42* 30*  CREATININE 1.65* 2.46* 1.84* 1.48*  CALCIUM 9.3 8.6* 7.7* 8.1*   GFR: Estimated Creatinine Clearance: 33.9 mL/min (A) (by C-G formula based on SCr of 1.48 mg/dL (H)). Liver Function Tests: Recent Labs  Lab 10/23/17 1115 10/26/17 1356 10/27/17 0615  AST 53* 33 25  ALT 35 24 16  ALKPHOS 255* 155* 110  BILITOT 0.5 1.4* 1.7*  PROT 8.0 7.2 5.6*  ALBUMIN 3.1* 2.7* 2.1*   No results for input(s): LIPASE, AMYLASE in the last 168 hours. No results for input(s): AMMONIA in the last 168 hours. Coagulation Profile: No results for input(s): INR, PROTIME in the last 168 hours. Cardiac Enzymes: No results for input(s): CKTOTAL, CKMB, CKMBINDEX, TROPONINI in the last 168 hours. BNP (last 3 results) No results for input(s): PROBNP in the last 8760 hours. HbA1C: No results for input(s): HGBA1C in the last 72 hours. CBG: No results for input(s): GLUCAP in the last 168 hours. Lipid Profile: No results for input(s): CHOL, HDL, LDLCALC, TRIG, CHOLHDL, LDLDIRECT in the last 72 hours. Thyroid Function Tests: No results for input(s): TSH, T4TOTAL, FREET4, T3FREE, THYROIDAB in the last 72 hours. Anemia Panel: No results for input(s): VITAMINB12, FOLATE, FERRITIN, TIBC, IRON, RETICCTPCT in the last 72 hours. Sepsis Labs: Recent Labs  Lab 10/26/17 1402 10/26/17 1532  LATICACIDVEN 2.45* 2.01*    Recent Results (from the past 240 hour(s))  Blood Culture (routine  x 2)     Status: Abnormal (Preliminary result)   Collection Time: 10/26/17  2:11 PM  Result Value Ref Range Status   Specimen Description   Final    BLOOD RIGHT ANTECUBITAL Performed at [facility] [address], [place], [place] [address]    Special Requests   Final    BOTTLES DRAWN AEROBIC AND ANAEROBIC Blood Culture results may not be optimal due to an excessive volume of blood received in culture bottles Performed at [facility] [address], [place], [place] [address]    Culture  Setup Time   Final    GRAM NEGATIVE RODS IN BOTH AEROBIC AND ANAEROBIC BOTTLES CRITICAL RESULT CALLED TO, READ BACK BY AND VERIFIED WITH: N.GOOGOVAC,PHARMD AT 5462 ON 10/27/17 BY G.MCADOO    Culture (A)  Final    ESCHERICHIA COLI SUSCEPTIBILITIES TO FOLLOW Performed at [facility], [place] [address] [place], [place] [address]    Report Status PENDING  Incomplete  Blood  Culture ID Panel (Reflexed)     Status: Abnormal   Collection Time: 10/26/17  2:11 PM  Result Value Ref Range Status   Enterococcus species NOT DETECTED NOT DETECTED Final   Listeria monocytogenes NOT DETECTED NOT DETECTED Final   Staphylococcus species NOT DETECTED NOT DETECTED Final   Staphylococcus aureus NOT DETECTED NOT DETECTED Final   Streptococcus species NOT DETECTED NOT DETECTED Final   Streptococcus agalactiae NOT DETECTED NOT DETECTED Final   Streptococcus pneumoniae NOT DETECTED NOT DETECTED Final   Streptococcus pyogenes NOT DETECTED NOT DETECTED Final   Acinetobacter baumannii NOT DETECTED NOT DETECTED Final   Enterobacteriaceae species DETECTED (A) NOT DETECTED Final    Comment: Enterobacteriaceae represent a large family of gram-negative bacteria, not a single organism. CRITICAL RESULT CALLED TO, READ BACK BY AND VERIFIED WITH: N.GOOGOVAC,PHARMD AT 7829 ON 10/27/17 BY G.MCADOO    Enterobacter cloacae complex NOT DETECTED NOT DETECTED Final   Escherichia coli DETECTED  (A) NOT DETECTED Final    Comment: CRITICAL RESULT CALLED TO, READ BACK BY AND VERIFIED WITH: N.GOOGOVAC,PHARMD AT 5621 ON 10/27/17 BY G.MCADOO    Klebsiella oxytoca NOT DETECTED NOT DETECTED Final   Klebsiella pneumoniae NOT DETECTED NOT DETECTED Final   Proteus species NOT DETECTED NOT DETECTED Final   Serratia marcescens NOT DETECTED NOT DETECTED Final   Carbapenem resistance NOT DETECTED NOT DETECTED Final   Haemophilus influenzae NOT DETECTED NOT DETECTED Final   Neisseria meningitidis NOT DETECTED NOT DETECTED Final   Pseudomonas aeruginosa NOT DETECTED NOT DETECTED Final   Candida albicans NOT DETECTED NOT DETECTED Final   Candida glabrata NOT DETECTED NOT DETECTED Final   Candida krusei NOT DETECTED NOT DETECTED Final   Candida parapsilosis NOT DETECTED NOT DETECTED Final   Candida tropicalis NOT DETECTED NOT DETECTED Final    Comment: Performed at [facility], [place] [address], [place], [place] [address]  Blood Culture (routine x 2)     Status: None (Preliminary result)   Collection Time: 10/26/17  2:25 PM  Result Value Ref Range Status   Specimen Description   Final    BLOOD RIGHT PORTA CATH Performed at [facility] [address], [place], [place] [address]    Special Requests   Final    BOTTLES DRAWN AEROBIC AND ANAEROBIC Blood Culture adequate volume Performed at [facility] [address] [place], [place] [address]    Culture  Setup Time   Final    GRAM NEGATIVE RODS IN BOTH AEROBIC AND ANAEROBIC BOTTLES CRITICAL VALUE NOTED.  VALUE IS CONSISTENT WITH PREVIOUSLY REPORTED AND CALLED VALUE. Performed at [facility], [place] [address], [place], [place] [address]    Culture GRAM NEGATIVE RODS  Final   Report Status PENDING  Incomplete  Culture, Urine     Status: Abnormal   Collection Time: 10/26/17  3:21 PM  Result Value Ref Range Status   Specimen Description   Final    URINE, CATHETERIZED Performed at [facility] [address] [place], [place] [address]    Special Requests   Final    NONE Performed at [facility], [place] [address], [place], [place] [address]    Culture MULTIPLE SPECIES PRESENT, SUGGEST RECOLLECTION (A)  Final   Report Status 10/27/2017 FINAL  Final         Radiology Studies: Dg Chest 2 View  Result Date: 10/26/2017 CLINICAL DATA:  72 year old with weakness, nausea and vomiting. Evaluate for sepsis.  Patient has metastatic cervical cancer to the lungs. EXAM: CHEST - 2 VIEW COMPARISON:  Chest CT 09/13/2017 FINDINGS: Right jugular Port-A-Cath with the tip in the lower SVC. There are innumerable nodular densities throughout both lungs compatible with known metastatic disease. No large areas of lung consolidation or airspace disease. Heart and mediastinum are within normal limits. Trachea is midline. No large pleural effusions. Again noted is a compression fracture at T12. IMPRESSION: Innumerable pulmonary nodules and compatible with known metastatic disease. No new airspace disease or consolidation in the lungs. Electronically Signed   By: Markus Daft M.D.   On: 10/26/2017 15:17        Scheduled Meds: . feeding supplement  1 Container Oral TID BM  . heparin  5,000 Units Subcutaneous Q8H  . nystatin   Topical TID  . ondansetron (ZOFRAN) IV  4 mg Intravenous Q8H  . pantoprazole  40 mg Oral Daily  . saccharomyces boulardii  250 mg Oral BID  . sodium chloride flush  10 mL Intravenous Q12H   Continuous Infusions: . sodium chloride    . cefTRIAXone (ROCEPHIN)  IV Stopped (10/27/17 1451)     LOS: 2 days    Georgette Shell, MD [facility]  If 7PM-7AM, please contact night-coverage [email or web address] Password [facility] 10/28/2017, 10:32 AM

## 2017-10-29 ENCOUNTER — Ambulatory Visit
Admission: RE | Admit: 2017-10-29 | Discharge: 2017-10-29 | Disposition: A | Discharge status: Home / Self Care | Payer: Medicare HMO | Source: Ambulatory Visit | Attending: Radiation Oncology | Admitting: Radiation Oncology

## 2017-10-29 ENCOUNTER — Ambulatory Visit: Payer: Medicare HMO

## 2017-10-29 ENCOUNTER — Inpatient Hospital Stay: Payer: Medicare HMO | Admitting: Hematology and Oncology

## 2017-10-29 DIAGNOSIS — C539 Malignant neoplasm of cervix uteri, unspecified: Secondary | ICD-10-CM | POA: Diagnosis not present

## 2017-10-29 LAB — CULTURE, BLOOD (ROUTINE X 2): SPECIAL REQUESTS: ADEQUATE

## 2017-10-29 LAB — BASIC METABOLIC PANEL
ANION GAP: 8 (ref 5–15)
BUN: 23 mg/dL (ref 8–23)
CALCIUM: 8.4 mg/dL — AB (ref 8.9–10.3)
CO2: 21 mmol/L — ABNORMAL LOW (ref 22–32)
CREATININE: 1.34 mg/dL — AB (ref 0.44–1.00)
Chloride: 115 mmol/L — ABNORMAL HIGH (ref 98–111)
GFR calc Af Amer: 45 mL/min — ABNORMAL LOW (ref >60)
GFR, EST NON AFRICAN AMERICAN: 39 mL/min — AB (ref >60)
Glucose, Bld: 102 mg/dL — ABNORMAL HIGH (ref 70–99)
Potassium: 4 mmol/L (ref 3.5–5.1)
Sodium: 144 mmol/L (ref 135–145)

## 2017-10-29 LAB — CBC
HCT: 27.2 % — ABNORMAL LOW (ref 36.0–46.0)
HEMOGLOBIN: 9.1 g/dL — AB (ref 12.0–15.0)
MCH: 27.6 pg (ref 26.0–34.0)
MCHC: 33.5 g/dL (ref 30.0–36.0)
MCV: 82.4 fL (ref 78.0–100.0)
PLATELETS: 131 10*3/uL — AB (ref 150–400)
RBC: 3.3 MIL/uL — ABNORMAL LOW (ref 3.87–5.11)
RDW: 16 % — AB (ref 11.5–15.5)
WBC: 7 10*3/uL (ref 4.0–10.5)

## 2017-10-29 MED ORDER — ONDANSETRON HCL 4 MG/2ML IJ SOLN: 4.0000 mg | Freq: Three times a day (TID) | INTRAMUSCULAR | Status: DC | PRN

## 2017-10-29 MED ORDER — FLUCONAZOLE 150 MG PO TABS
150.0000 mg | ORAL_TABLET | Freq: Every day | ORAL | Status: DC
  Administered 2017-10-29 – 2017-10-30 (×2): 150 mg via ORAL
  Filled 2017-10-29 (×2): qty 1

## 2017-10-29 NOTE — Progress Notes (Signed)
Katherine Boone   DOB:24-Jan-1946   ER#:[id number]    Assessment & Plan:  Metastatic cervical cancer She has decline in performance status I recommend she completes her palliative radiation therapy Due to her poor performance status, I do not believe she would be strong enough for future palliative chemotherapy I recommend continue aggressive supportive care and for her to resume radiation therapy today.  Recurrent UTI/bacteremia She has responded well to intravenous antibiotic treatment Recommend we continue the same I have ordered repeat blood culture to ensure blood culture is negative before discharge  Severe anemia Multifactorial, likely due to recent infection, bone marrow suppression from antibiotics treatment and side effects of radiation therapy with ongoing vaginal bleeding She is symptomatic I recommend blood transfusion to keep hemoglobin greater than 8  Acute on chronic renal failure Secondary to dehydration and recent sepsis It is improving with IV fluids.  Continue the same  Significant fatigue and shortness of breath Chest x-ray showed significant pulmonary disease and she is anemic Continue supportive care  CODE STATUS DNR  Discharge planning I recommend a few more days of intravenous antibiotics to ensure clearance of bacteremia before discharge, hopefully by the end of the week.  I recommend she completes her radiation therapy if possible while she is hospitalized I will follow   Heath Lark, MD 10/29/2017  8:02 AM   Subjective:  She feels a bit better.  She was able to mobilize short distances without dizziness.  She was able to tolerate dinner without severe nausea vomiting.  No further diarrhea.  Objective:  Vitals:   10/28/17 2130 10/29/17 0551  BP: 114/70 123/83  Pulse: (!) 101 91  Resp: 17 17  Temp: 99 F (37.2 C) 98.8 F (37.1 C)  SpO2: 98% 95%     Intake/Output Summary (Last 24 hours) at 10/29/2017 0802 Last data filed at 10/28/2017  1900 Gross per 24 hour  Intake 787 ml  Output -  Net 787 ml    GENERAL:alert, no distress and comfortable SKIN: skin color is pale, texture, turgor are normal, no rashes or significant lesions Musculoskeletal:no cyanosis of digits and no clubbing  NEURO: alert & oriented x 3 with fluent speech, no focal motor/sensory deficits   Labs:  Lab Results  Component Value Date   WBC 7.0 10/29/2017   HGB 9.1 (L) 10/29/2017   HCT 27.2 (L) 10/29/2017   MCV 82.4 10/29/2017   PLT 131 (L) 10/29/2017   NEUTROABS 12.0 (H) 10/26/2017    Lab Results  Component Value Date   NA 144 10/29/2017   K 4.0 10/29/2017   CL 115 (H) 10/29/2017   CO2 21 (L) 10/29/2017

## 2017-10-29 NOTE — Progress Notes (Signed)
PROGRESS NOTE    Katherine Boone  QQI:[id number] DOB: 06/02/1945 DOA: 10/26/2017 PCP: [name], MD   Brief Narrative:71 y.o.femalewith medical history ofcervical cancer on palliative chemo and radiation,colostomy,CKD 4 who presents with nausea and weakness.In the ED she is found to have a fever of 102 but she has not been able to tell if she has had a fever at home. Her symptoms are going on for 1 wk now. Due to nausea, her husband states, she has not been drinking or eating and is very weak now.  She admits to a mild non productive cough but no other respiratory issues. She admits to watery diarrhea but no abdominal pain. No blood in stool. She is noted to have a positive UA and does have some pain when urinating. She has not noted any other change in urination. She is very incontinent and needs to wear pad. She states she had a UTI about 1 month ago as well.  ED Course:given Vancomycin, Cefepime and lactated ringers boluses   Assessment & Plan:   Principal Problem:   Severe sepsis (HCC) Active Problems:   Protein calorie malnutrition (HCC)   Colostomy in place [facility])   Cervical cancer (HCC)   Chronic kidney disease (CKD), stage IV (severe) (HCC)   AKI (acute kidney injury) ([place])   Anemia   Sepsis ([place])   Malnutrition of moderate degree  1]SEPSIS-ecoli/enterobacter  bacteremia SensitivE TO CEFEPIME. Most likely from urinary source.vanco dcd.continue cefepime. Patient has been having loose BMs which is getting better stools are starting to form.  Nausea and vomiting better starting to tolerate p.o. intake.  Stool studies were negative.  Continue probiotics. Patient reports feeling better this morning.   2]AKI-creatinine improved to 1.48 from 2.46 at the time of admission.dc ivf. AKI secondary to decreased p.o. intake and ongoing diarrhea and loss of fluid.  3]metastatic cervical cancer status post chemo and radiation to the pelvis had 16 radiation treatments. Has  been having urinary complaints as well as diarrhea for a long time. She reports having diarrhea 4 weeks since her radiation has started. This makes me think she probably does not have a C. difficile colitis at this time.  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  But she has not had diarrhea.  Diarrhea started after radiation has been started.  5] hypokalemia due to diarrhea -replete   6]anemia hb drop from 12 to 8.9 due to hemodilution no signs of bleeding.stool brown.      DVT prophylaxis:HEPARIN Code Status:DNR Family CommunicationNONE Disposition Plan: Plan hopefully discharge by the end of the week once oncology signed OFF   Consultants: NONE  Procedures:NONE Antimicrobials:  CEFEPIME Subjective: Feeling better no nausea or vomiting stool is starting to form   Objective: Vitals:   10/28/17 1354 10/28/17 2130 10/29/17 0551 10/29/17 0745  BP: 114/72 114/70 123/83   Pulse: (!) 102 (!) 101 91 (!) 101  Resp: 16 17 17 18   Temp: 98.6 F (37 C) 99 F (37.2 C) 98.8 F (37.1 C)   TempSrc: Oral Oral Oral   SpO2: 97% 98% 95% 97%  Weight:      Height:        Intake/Output Summary (Last 24 hours) at 10/29/2017 1220 Last data filed at 10/28/2017 1900 Gross per 24 hour  Intake 100 ml  Output -  Net 100 ml   Filed Weights   10/26/17 1329 10/26/17 1805  Weight: 65.8 kg 67.3 kg    Examination:  General exam: Appears  calm and comfortable  Respiratory system: Clear to auscultation. Respiratory effort normal. Cardiovascular system: S1 & S2 heard, RRR. No JVD, murmurs, rubs, gallops or clicks. No pedal edema. Gastrointestinal system: Abdomen is nondistended, soft and nontender. No organomegaly or masses felt. Normal bowel sounds heard. Extremities: Symmetric 5 x 5 power. Skin: No rashes, lesions or ulcers    Data Reviewed: I have personally reviewed following labs and imaging studies  CBC: Recent Labs  Lab 10/22/17 1610 10/26/17 1426  10/27/17 0615 10/28/17 0538 10/29/17 0605  WBC 10.2 13.0* 8.4 6.7 7.0  NEUTROABS 8.2* 12.0*  --   --   --   HGB 12.0 10.4* 8.7* 8.9* 9.1*  HCT 36.6 31.1* 25.7* 27.2* 27.2*  MCV 84.5 83.2 82.6 82.9 82.4  PLT 169 150 107* 124* 962*   Basic Metabolic Panel: Recent Labs  Lab 10/23/17 1115 10/26/17 1356 10/27/17 0615 10/28/17 0538 10/29/17 0605  NA 139 138 140 141 144  K 4.4 4.8 3.6 3.4* 4.0  CL 105 106 112* 113* 115*  CO2 25 21* 21* 23 21*  GLUCOSE 131* 145* 100* 93 102*  BUN 26* 47* 42* 30* 23  CREATININE 1.65* 2.46* 1.84* 1.48* 1.34*  CALCIUM 9.3 8.6* 7.7* 8.1* 8.4*   GFR: Estimated Creatinine Clearance: 37.4 mL/min (A) (by C-G formula based on SCr of 1.34 mg/dL (H)). Liver Function Tests: Recent Labs  Lab 10/23/17 1115 10/26/17 1356 10/27/17 0615  AST 53* 33 25  ALT 35 24 16  ALKPHOS 255* 155* 110  BILITOT 0.5 1.4* 1.7*  PROT 8.0 7.2 5.6*  ALBUMIN 3.1* 2.7* 2.1*   No results for input(s): LIPASE, AMYLASE in the last 168 hours. No results for input(s): AMMONIA in the last 168 hours. Coagulation Profile: No results for input(s): INR, PROTIME in the last 168 hours. Cardiac Enzymes: No results for input(s): CKTOTAL, CKMB, CKMBINDEX, TROPONINI in the last 168 hours. BNP (last 3 results) No results for input(s): PROBNP in the last 8760 hours. HbA1C: No results for input(s): HGBA1C in the last 72 hours. CBG: No results for input(s): GLUCAP in the last 168 hours. Lipid Profile: No results for input(s): CHOL, HDL, LDLCALC, TRIG, CHOLHDL, LDLDIRECT in the last 72 hours. Thyroid Function Tests: No results for input(s): TSH, T4TOTAL, FREET4, T3FREE, THYROIDAB in the last 72 hours. Anemia Panel: No results for input(s): VITAMINB12, FOLATE, FERRITIN, TIBC, IRON, RETICCTPCT in the last 72 hours. Sepsis Labs: Recent Labs  Lab 10/26/17 1402 10/26/17 1532  LATICACIDVEN 2.45* 2.01*    Recent Results (from the past 240 hour(s))  Blood Culture (routine x 2)     Status:  Abnormal   Collection Time: 10/26/17  2:11 PM  Result Value Ref Range Status   Specimen Description   Final    BLOOD RIGHT ANTECUBITAL Performed at [facility] [address] [place], [place] [address]    Special Requests   Final    BOTTLES DRAWN AEROBIC AND ANAEROBIC Blood Culture results may not be optimal due to an excessive volume of blood received in culture bottles Performed at [facility] [address], [place], [place] [address]    Culture  Setup Time   Final    GRAM NEGATIVE RODS IN BOTH AEROBIC AND ANAEROBIC BOTTLES CRITICAL RESULT CALLED TO, READ BACK BY AND VERIFIED WITH: N.GOOGOVAC,PHARMD AT 0102 ON 10/27/17 BY G.MCADOO Performed at [facility], [place] [address] [place], [place] [address]    Culture ESCHERICHIA COLI (A)  Final   Report  Status 10/29/2017 FINAL  Final   Organism ID, Bacteria ESCHERICHIA COLI  Final      Susceptibility   Escherichia coli - MIC*    AMPICILLIN >=32 RESISTANT Resistant     CEFAZOLIN <=4 SENSITIVE Sensitive     CEFEPIME <=1 SENSITIVE Sensitive     CEFTAZIDIME <=1 SENSITIVE Sensitive     CEFTRIAXONE <=1 SENSITIVE Sensitive     CIPROFLOXACIN <=0.25 SENSITIVE Sensitive     GENTAMICIN <=1 SENSITIVE Sensitive     IMIPENEM <=0.25 SENSITIVE Sensitive     TRIMETH/SULFA <=20 SENSITIVE Sensitive     AMPICILLIN/SULBACTAM 16 INTERMEDIATE Intermediate     PIP/TAZO <=4 SENSITIVE Sensitive     Extended ESBL NEGATIVE Sensitive     * ESCHERICHIA COLI  Blood Culture ID Panel (Reflexed)     Status: Abnormal   Collection Time: 10/26/17  2:11 PM  Result Value Ref Range Status   Enterococcus species NOT DETECTED NOT DETECTED Final   Listeria monocytogenes NOT DETECTED NOT DETECTED Final   Staphylococcus species NOT DETECTED NOT DETECTED Final   Staphylococcus aureus NOT DETECTED NOT DETECTED Final   Streptococcus species NOT DETECTED NOT DETECTED Final   Streptococcus agalactiae NOT DETECTED NOT DETECTED  Final   Streptococcus pneumoniae NOT DETECTED NOT DETECTED Final   Streptococcus pyogenes NOT DETECTED NOT DETECTED Final   Acinetobacter baumannii NOT DETECTED NOT DETECTED Final   Enterobacteriaceae species DETECTED (A) NOT DETECTED Final    Comment: Enterobacteriaceae represent a large family of gram-negative bacteria, not a single organism. CRITICAL RESULT CALLED TO, READ BACK BY AND VERIFIED WITH: N.GOOGOVAC,PHARMD AT 4166 ON 10/27/17 BY G.MCADOO    Enterobacter cloacae complex NOT DETECTED NOT DETECTED Final   Escherichia coli DETECTED (A) NOT DETECTED Final    Comment: CRITICAL RESULT CALLED TO, READ BACK BY AND VERIFIED WITH: N.GOOGOVAC,PHARMD AT 0630 ON 10/27/17 BY G.MCADOO    Klebsiella oxytoca NOT DETECTED NOT DETECTED Final   Klebsiella pneumoniae NOT DETECTED NOT DETECTED Final   Proteus species NOT DETECTED NOT DETECTED Final   Serratia marcescens NOT DETECTED NOT DETECTED Final   Carbapenem resistance NOT DETECTED NOT DETECTED Final   Haemophilus influenzae NOT DETECTED NOT DETECTED Final   Neisseria meningitidis NOT DETECTED NOT DETECTED Final   Pseudomonas aeruginosa NOT DETECTED NOT DETECTED Final   Candida albicans NOT DETECTED NOT DETECTED Final   Candida glabrata NOT DETECTED NOT DETECTED Final   Candida krusei NOT DETECTED NOT DETECTED Final   Candida parapsilosis NOT DETECTED NOT DETECTED Final   Candida tropicalis NOT DETECTED NOT DETECTED Final    Comment: Performed at [facility], [place] [address], [place], [place] [address]  Blood Culture (routine x 2)     Status: Abnormal   Collection Time: 10/26/17  2:25 PM  Result Value Ref Range Status   Specimen Description   Final    BLOOD RIGHT PORTA CATH Performed at [facility] [address], [place], [place] [address]    Special Requests   Final    BOTTLES DRAWN AEROBIC AND ANAEROBIC Blood Culture adequate volume Performed at [facility] [address]  [place], [place] [address]    Culture  Setup Time   Final    GRAM NEGATIVE RODS IN BOTH AEROBIC AND ANAEROBIC BOTTLES CRITICAL VALUE NOTED.  VALUE IS CONSISTENT WITH PREVIOUSLY REPORTED AND CALLED VALUE.    Culture (A)  Final    ESCHERICHIA COLI SUSCEPTIBILITIES PERFORMED ON PREVIOUS CULTURE WITHIN THE LAST 5 DAYS. Performed at  [facility], [place] [address], [place], [place] [address]    Report Status 10/29/2017 FINAL  Final  Culture, Urine     Status: Abnormal   Collection Time: 10/26/17  3:21 PM  Result Value Ref Range Status   Specimen Description   Final    URINE, CATHETERIZED Performed at [facility] [address], [place], [place] [address]    Special Requests   Final    NONE Performed at [facility], [place] [address], [place], [place] [address]    Culture MULTIPLE SPECIES PRESENT, SUGGEST RECOLLECTION (A)  Final   Report Status 10/27/2017 FINAL  Final  Gastrointestinal Panel by PCR , Stool     Status: None   Collection Time: 10/27/17  6:23 AM  Result Value Ref Range Status   Campylobacter species NOT DETECTED NOT DETECTED Final   Plesimonas shigelloides NOT DETECTED NOT DETECTED Final   Salmonella species NOT DETECTED NOT DETECTED Final   Yersinia enterocolitica NOT DETECTED NOT DETECTED Final   Vibrio species NOT DETECTED NOT DETECTED Final   Vibrio cholerae NOT DETECTED NOT DETECTED Final   Enteroaggregative E coli (EAEC) NOT DETECTED NOT DETECTED Final   Enteropathogenic E coli (EPEC) NOT DETECTED NOT DETECTED Final   Enterotoxigenic E coli (ETEC) NOT DETECTED NOT DETECTED Final   Shiga like toxin producing E coli (STEC) NOT DETECTED NOT DETECTED Final   Shigella/Enteroinvasive E coli (EIEC) NOT DETECTED NOT DETECTED Final   Cryptosporidium NOT DETECTED NOT DETECTED Final   Cyclospora cayetanensis NOT DETECTED NOT DETECTED Final   Entamoeba histolytica NOT DETECTED NOT DETECTED Final   Giardia lamblia NOT DETECTED NOT  DETECTED Final   Adenovirus F40/41 NOT DETECTED NOT DETECTED Final   Astrovirus NOT DETECTED NOT DETECTED Final   Norovirus GI/GII NOT DETECTED NOT DETECTED Final   Rotavirus A NOT DETECTED NOT DETECTED Final   Sapovirus (I, II, IV, and V) NOT DETECTED NOT DETECTED Final    Comment: Performed at [facility], [address] [place],  [address]         Radiology Studies: No results found.      Scheduled Meds: . feeding supplement  1 Container Oral TID BM  . fluconazole  150 mg Oral Daily  . heparin  5,000 Units Subcutaneous Q8H  . nystatin   Topical TID  . pantoprazole  40 mg Oral Daily  . saccharomyces boulardii  250 mg Oral BID  . sodium chloride flush  10 mL Intravenous Q12H   Continuous Infusions: . sodium chloride    . cefTRIAXone (ROCEPHIN)  IV Stopped (10/28/17 1450)     LOS: 3 days    Georgette Shell, MD [facility] If 7PM-7AM, please contact night-coverage [email or web address] Password [facility] 10/29/2017, 12:20 PM

## 2017-10-29 NOTE — Care Management Important Message (Signed)
Important Message  Patient Details  Name: Katherine Boone MRN: [id number] Date of Birth: 09/17/1945   Medicare Important Message Given:  Yes    Kerin Salen 10/29/2017, 12:54 PM

## 2017-10-29 NOTE — Care Management Important Message (Signed)
Important Message  Patient Details  Name: Katherine Boone MRN: [id number] Date of Birth: 12/21/1945   Medicare Important Message Given:  Yes    Kerin Salen 10/29/2017, 12:37 [place] Message  Patient Details  Name: Katherine Boone MRN: [id number] Date of Birth: 1946/01/16   Medicare Important Message Given:  Yes    Kerin Salen 10/29/2017, 12:36 PM

## 2017-10-29 NOTE — Progress Notes (Signed)
[facility] Therapy Treatment Record Phone [phone]   Radiation Therapy was administered to Katherine Boone on: 10/29/2017  3:32 PM and was treatment # 22 out of a planned course of 25 treatments.  Radiation Treatment  1). Beam photons with 6-10 energy  2). Brachytherapy None  3). Stereotactic Radiosurgery None  4). Other Radiation None     Venus Gilles F Telisha Zawadzki, RT (T)

## 2017-10-29 NOTE — Progress Notes (Signed)
Initial Nutrition Assessment  DOCUMENTATION CODES:   Non-severe (moderate) malnutrition in context of acute illness/injury  INTERVENTION:   Provide Boost Breeze po TID, each supplement provides 250 kcal and 9 grams of protein  NUTRITION DIAGNOSIS:   Moderate Malnutrition related to acute illness, nausea, vomiting, diarrhea, cancer and cancer related treatments as evidenced by percent weight loss, energy intake < 75% for > 7 days.  GOAL:   Patient will meet greater than or equal to 90% of their needs  MONITOR:   PO intake, Supplement acceptance, Labs, Weight trends, I & O's  REASON FOR ASSESSMENT:   Malnutrition Screening Tool    ASSESSMENT:   72 y.o. female with medical history of cervical cancer on palliative chemo and radiation, colostomy, CKD 4 who presents with nausea and weakness.  Patient followed by [facility] RD, last seen on 8/7. At that visit, pt reported poor appetite. Was provided diet education. Refused protein supplements as she states she doesn't like them.  Pt now in hospital following 1 week of nausea and not eating or drinking per husband. Nausea has continued during admission. Pt has had diarrhea since starting radiation treatments 4 weeks ago. RD has ordered Boost Breeze supplements for patient to try as she was only consuming clears. Now on regular diet, consumed 75-80% of breakfast yesterday. No dinner as she wasn't hungry.  Per weight records, pt has lost 25 lb since 3/6 (14% wt loss x 6 months, significant for time frame).  Medications: IV Zofran every 8 hours, Florastor capsule BID Labs reviewed: GFR:  39  NUTRITION - FOCUSED PHYSICAL EXAM:  Will attempt at follow-up.  Diet Order:   Diet Order            Diet regular Room service appropriate? Yes; Fluid consistency: Thin  Diet effective now              EDUCATION NEEDS:   No education needs have been identified at this time  Skin:  Skin Assessment: Reviewed RN Assessment  Last  BM:  8/26  Height:   Ht Readings from Last 1 Encounters:  10/26/17 5\' 7"  (1.702 m)    Weight:   Wt Readings from Last 1 Encounters:  10/26/17 67.3 kg    Ideal Body Weight:  61.4 kg  BMI:  Body mass index is 23.24 kg/m.  Estimated Nutritional Needs:   Kcal:  2000-2200  Protein:  100-110g  Fluid:  2L/day  Clayton Bibles, MS, RD, LDN [place] Dietitian Pager: [phone] After Hours Pager: [phone]

## 2017-10-30 ENCOUNTER — Ambulatory Visit: Payer: Medicare HMO

## 2017-10-30 ENCOUNTER — Ambulatory Visit
Admission: RE | Admit: 2017-10-30 | Discharge: 2017-10-30 | Disposition: A | Discharge status: Home / Self Care | Payer: Medicare HMO | Source: Ambulatory Visit | Attending: Radiation Oncology | Admitting: Radiation Oncology

## 2017-10-30 DIAGNOSIS — N179 Acute kidney failure, unspecified: Secondary | ICD-10-CM

## 2017-10-30 DIAGNOSIS — A419 Sepsis, unspecified organism: Secondary | ICD-10-CM

## 2017-10-30 DIAGNOSIS — R6520 Severe sepsis without septic shock: Secondary | ICD-10-CM

## 2017-10-30 DIAGNOSIS — C539 Malignant neoplasm of cervix uteri, unspecified: Secondary | ICD-10-CM

## 2017-10-30 DIAGNOSIS — E46 Unspecified protein-calorie malnutrition: Secondary | ICD-10-CM

## 2017-10-30 DIAGNOSIS — D649 Anemia, unspecified: Secondary | ICD-10-CM

## 2017-10-30 DIAGNOSIS — Z933 Colostomy status: Secondary | ICD-10-CM

## 2017-10-30 DIAGNOSIS — N184 Chronic kidney disease, stage 4 (severe): Secondary | ICD-10-CM

## 2017-10-30 DIAGNOSIS — E440 Moderate protein-calorie malnutrition: Secondary | ICD-10-CM

## 2017-10-30 LAB — BASIC METABOLIC PANEL
Anion gap: 8 (ref 5–15)
BUN: 20 mg/dL (ref 8–23)
CO2: 22 mmol/L (ref 22–32)
CREATININE: 1.27 mg/dL — AB (ref 0.44–1.00)
Calcium: 8.5 mg/dL — ABNORMAL LOW (ref 8.9–10.3)
Chloride: 115 mmol/L — ABNORMAL HIGH (ref 98–111)
GFR calc non Af Amer: 41 mL/min — ABNORMAL LOW (ref >60)
GFR, EST AFRICAN AMERICAN: 48 mL/min — AB (ref >60)
Glucose, Bld: 116 mg/dL — ABNORMAL HIGH (ref 70–99)
POTASSIUM: 3.6 mmol/L (ref 3.5–5.1)
Sodium: 145 mmol/L (ref 135–145)

## 2017-10-30 LAB — CBC
HCT: 29.6 % — ABNORMAL LOW (ref 36.0–46.0)
Hemoglobin: 9.8 g/dL — ABNORMAL LOW (ref 12.0–15.0)
MCH: 27.1 pg (ref 26.0–34.0)
MCHC: 33.1 g/dL (ref 30.0–36.0)
MCV: 82 fL (ref 78.0–100.0)
PLATELETS: 153 10*3/uL (ref 150–400)
RBC: 3.61 MIL/uL — AB (ref 3.87–5.11)
RDW: 16.1 % — ABNORMAL HIGH (ref 11.5–15.5)
WBC: 8.5 10*3/uL (ref 4.0–10.5)

## 2017-10-30 MED ORDER — HEPARIN SOD (PORK) LOCK FLUSH 100 UNIT/ML IV SOLN
500.0000 [IU] | INTRAVENOUS | Status: AC | PRN
  Administered 2017-10-30: 500 [IU]
  Filled 2017-10-30: qty 5

## 2017-10-30 MED ORDER — FLUCONAZOLE 150 MG PO TABS: 150.0000 mg | ORAL_TABLET | Freq: Every day | ORAL | 0 refills | Status: DC

## 2017-10-30 MED ORDER — NYSTATIN 100000 UNIT/GM EX POWD: Freq: Three times a day (TID) | CUTANEOUS | 0 refills | Status: AC

## 2017-10-30 MED ORDER — CEPHALEXIN 500 MG PO CAPS: 500.0000 mg | ORAL_CAPSULE | Freq: Three times a day (TID) | ORAL | 0 refills | Status: DC

## 2017-10-30 MED ORDER — ACETAMINOPHEN 325 MG PO TABS: 650.0000 mg | ORAL_TABLET | Freq: Four times a day (QID) | ORAL | 0 refills | Status: AC | PRN

## 2017-10-30 MED ORDER — CEPHALEXIN 500 MG PO CAPS
500.0000 mg | ORAL_CAPSULE | Freq: Three times a day (TID) | ORAL | Status: DC
  Administered 2017-10-30: 500 mg via ORAL
  Filled 2017-10-30: qty 1

## 2017-10-30 MED ORDER — SACCHAROMYCES BOULARDII 250 MG PO CAPS: 250.0000 mg | ORAL_CAPSULE | Freq: Two times a day (BID) | ORAL | 0 refills | Status: AC

## 2017-10-30 NOTE — Discharge Summary (Addendum)
Physician Discharge Summary  SYNDEY JASKOLSKI ZOX:[id number] DOB: 1946/01/24 DOA: 10/26/2017  PCP: [name], MD  Admit date: 10/26/2017 Discharge date: 10/30/2017  Admitted From: Home Disposition: Home  Recommendations for Outpatient Follow-up:  1. Follow up with PCP in 1-2 weeks 2. Follow up with Oncology Dr. Alvy Bimler within 1 week 3. Please obtain CMP/CBC, Mag, Phos in one week 4. Please follow up on the following pending results:  Home Health: Home Equipment/Devices: None   Discharge Condition: Stable CODE STATUS: FULL CODE Diet recommendation:   Brief/Interim Summary: The patient is a 72 y.o.femalewith medical history ofcervical cancer on palliative chemo and radiation,colostomy,CKD 4 and other comorbids who presented with nausea and weakness.In the ED she is found to have a fever of 102 but she has not been able to tell if she has had a fever at home. Her symptoms are going on for 1 wk now. Due to nausea, her husband states, she has not been drinking or eating and is very weak now.   She admitted to a mild non productive cough but no other respiratory issues. She also admitted to watery diarrhea but no abdominal pain. No blood in stool. She is noted to have a positive UA and does have some pain when urinating. She has not noted any other change in urination. She is very incontinent and needs to wear pad. She states she had a UTI about 1 month ago as well.  In the ED she was given Vancomycin, Cefepime and lactated ringers boluses. She was admitted and found to have an E Coli Bacteremia and treated with IV Abx and changed to po Keflex at D/C. Patient was urged to stay to complete Radiation Treatment but she was adamant about leaving because she needed to go to [place] to sell her house. She was deemed stable to be D/C'd Home and will need to complete Radiation Treatment and follow up with PCP and Oncology as an outpatient.   Discharge Diagnoses:  Principal Problem:    Severe sepsis ([place]) Active Problems:   Protein calorie malnutrition ([place])   Colostomy in place [facility])   Cervical cancer ([place])   Chronic kidney disease (CKD), stage IV (severe) ([facility])   AKI (acute kidney injury) ([place])   Anemia   Sepsis ([place])   Malnutrition of moderate degree  SEPSIS 2/2 -ecoli/enterobacterbacteremia  -SensitivE TO CEFEPIME.  -Most likelyfrom urinary source.vanco dcd. -Cefepime changed to Ceftriaxone and now Keflex.  -Patient has been having loose BMs which is getting better stools are starting to form and likley from Radiation Enteritis.   -Nausea and vomiting better starting to tolerate p.o. intake.   -Stool studies were negative.  Continue probiotics. -Patient reports feeling better this morning and has improved.  AKI on CKD Stage 4, improved  -Creatinine improved to 1.22from 2.46 at the time of admission. -D/C'd ivf. -AKI secondary to decreased p.o. intake and ongoing diarrhea and loss of fluid. -Avoid Nephrotoxic Medications -Repeat CMP in AM   Metastatic cervical cancer status post chemo and radiation to the pelvis had 23 radiation treatments. -Has been having urinary complaints as well as diarrhea for a long time.  -She reports having diarrhea 4 weeks since her radiation has started.  -Unlikely C. difficile colitis at this time. -Discussed with patient about completing Radiation but she is adamant about leaving. Dr. 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 But she  has not had diarrhea. Diarrhea started after radiation has been started.  Hypokalemia due to diarrhea  -Replete as necessary -Repeat CMP as an outpateint   Normocytic Anemia -Hgb drop from 12 to 8.9 due to hemodilution no signs of bleeding -Hb/Hct stable at 9.8/29.6 and will continue to monitor for S/Sx of Bleeding -Repeat CBC as an outpatient.   Discharge  Instructions  Discharge Instructions    Call MD for:  difficulty breathing, headache or visual disturbances   Complete by:  As directed    Call MD for:  extreme fatigue   Complete by:  As directed    Call MD for:  hives   Complete by:  As directed    Call MD for:  persistant dizziness or light-headedness   Complete by:  As directed    Call MD for:  persistant nausea and vomiting   Complete by:  As directed    Call MD for:  redness, tenderness, or signs of infection (pain, swelling, redness, odor or green/yellow discharge around incision site)   Complete by:  As directed    Call MD for:  severe uncontrolled pain   Complete by:  As directed    Call MD for:  temperature >100.4   Complete by:  As directed    Diet - low sodium heart healthy   Complete by:  As directed    Discharge instructions   Complete by:  As directed    You were cared for by a hospitalist during your hospital stay. If you have any questions about your discharge medications or the care you received while you were in the hospital after you are discharged, you can call the unit and ask to speak with the hospitalist on call if the hospitalist that took care of you is not available. Once you are discharged, your primary care physician will handle any further medical issues. Please note that NO REFILLS for any discharge medications will be authorized once you are discharged, as it is imperative that you return to your primary care physician (or establish a relationship with a primary care physician if you do not have one) for your aftercare needs so that they can reassess your need for medications and monitor your lab values.  Follow up with PCP and Oncology Dr. Alvy Bimler. Take all medications as prescribed. If symptoms change or worsen please return to the ED for evaluation   Increase activity slowly   Complete by:  As directed      Allergies as of 10/30/2017   No Known Allergies     Medication List    STOP taking these  medications   HYDROmorphone 4 MG tablet Commonly known as:  DILAUDID   nitrofurantoin (macrocrystal-monohydrate) 100 MG capsule Commonly known as:  MACROBID     TAKE these medications   acetaminophen 325 MG tablet Commonly known as:  TYLENOL Take 2 tablets (650 mg total) by mouth every 6 (six) hours as needed for mild pain (or Fever >/= 101).   cephALEXin 500 MG capsule Commonly known as:  KEFLEX Take 1 capsule (500 mg total) by mouth every 8 (eight) hours for 30 doses.   fluconazole 150 MG tablet Commonly known as:  DIFLUCAN Take 1 tablet (150 mg total) by mouth daily. Start taking on:  10/31/2017   lidocaine-prilocaine cream Commonly known as:  EMLA Apply 1 application topically as needed.   nystatin powder Commonly known as:  MYCOSTATIN/NYSTOP Apply topically 3 (three) times daily.   omeprazole 20 MG capsule Commonly  known as:  PRILOSEC TAKE 1 CAPSULE BY MOUTH EVERY DAY What changed:    how much to take  when to take this  reasons to take this   ondansetron 8 MG tablet Commonly known as:  ZOFRAN Take 1 tablet (8 mg total) by mouth every 8 (eight) hours as needed for nausea.   saccharomyces boulardii 250 MG capsule Commonly known as:  FLORASTOR Take 1 capsule (250 mg total) by mouth 2 (two) times daily.       No Known Allergies  Consultations:  Medical Oncology  Radiation Oncology  Procedures/Studies: Dg Chest 2 View  Result Date: 10/26/2017 CLINICAL DATA:  72 year old with weakness, nausea and vomiting. Evaluate for sepsis. Patient has metastatic cervical cancer to the lungs. EXAM: CHEST - 2 VIEW COMPARISON:  Chest CT 09/13/2017 FINDINGS: Right jugular Port-A-Cath with the tip in the lower SVC. There are innumerable nodular densities throughout both lungs compatible with known metastatic disease. No large areas of lung consolidation or airspace disease. Heart and mediastinum are within normal limits. Trachea is midline. No large pleural effusions.  Again noted is a compression fracture at T12. IMPRESSION: Innumerable pulmonary nodules and compatible with known metastatic disease. No new airspace disease or consolidation in the lungs. Electronically Signed   By: Markus Daft M.D.   On: 10/26/2017 15:17    Subjective: Seen and Examined at bedside and she states she is feeling better.  No chest pain, shortness breath, nausea, vomiting.  States vomiting and nausea is improved and diarrhea is slowing down more formed.  No other concerns but at this time and is ready to go home.  Discharge Exam: Vitals:   10/30/17 0555 10/30/17 0817  BP: 115/70   Pulse: 90   Resp: 14   Temp: 98.9 F (37.2 C)   SpO2: 96% 95%   Vitals:   10/29/17 1506 10/29/17 2118 10/30/17 0555 10/30/17 0817  BP: 121/83 118/72 115/70   Pulse: (!) 102 93 90   Resp: 18 15 14    Temp: 98.2 F (36.8 C) 98.7 F (37.1 C) 98.9 F (37.2 C)   TempSrc: Oral Oral Oral   SpO2: 99% 97% 96% 95%  Weight:      Height:       General: Pt is alert, awake, not in acute distress Cardiovascular: RRR, S1/S2 +, no rubs, no gallops Respiratory: Diminished bilaterally, no wheezing, no rhonchi Abdominal: Soft, NT, ND, bowel sounds +; Colostomy in place with some air and slightly loose stool Extremities: no edema, no cyanosis  The results of significant diagnostics from this hospitalization (including imaging, microbiology, ancillary and laboratory) are listed below for reference.    Microbiology: Recent Results (from the past 240 hour(s))  Blood Culture (routine x 2)     Status: Abnormal   Collection Time: 10/26/17  2:11 PM  Result Value Ref Range Status   Specimen Description   Final    BLOOD RIGHT ANTECUBITAL Performed at [facility] [address] [place], [place] [address]    Special Requests   Final    BOTTLES DRAWN AEROBIC AND ANAEROBIC Blood Culture results may not be optimal due to an excessive volume of blood received in culture bottles Performed at  [facility] [address] [place], [place] [address]    Culture  Setup Time   Final    GRAM NEGATIVE RODS IN BOTH AEROBIC AND ANAEROBIC BOTTLES CRITICAL RESULT CALLED TO, READ BACK BY AND VERIFIED WITH: N.GOOGOVAC,PHARMD AT 0093 ON 10/27/17 BY  G.MCADOO Performed at [facility], [place] [address], [place], [place] [address]    Culture ESCHERICHIA COLI (A)  Final   Report Status 10/29/2017 FINAL  Final   Organism ID, Bacteria ESCHERICHIA COLI  Final      Susceptibility   Escherichia coli - MIC*    AMPICILLIN >=32 RESISTANT Resistant     CEFAZOLIN <=4 SENSITIVE Sensitive     CEFEPIME <=1 SENSITIVE Sensitive     CEFTAZIDIME <=1 SENSITIVE Sensitive     CEFTRIAXONE <=1 SENSITIVE Sensitive     CIPROFLOXACIN <=0.25 SENSITIVE Sensitive     GENTAMICIN <=1 SENSITIVE Sensitive     IMIPENEM <=0.25 SENSITIVE Sensitive     TRIMETH/SULFA <=20 SENSITIVE Sensitive     AMPICILLIN/SULBACTAM 16 INTERMEDIATE Intermediate     PIP/TAZO <=4 SENSITIVE Sensitive     Extended ESBL NEGATIVE Sensitive     * ESCHERICHIA COLI  Blood Culture ID Panel (Reflexed)     Status: Abnormal   Collection Time: 10/26/17  2:11 PM  Result Value Ref Range Status   Enterococcus species NOT DETECTED NOT DETECTED Final   Listeria monocytogenes NOT DETECTED NOT DETECTED Final   Staphylococcus species NOT DETECTED NOT DETECTED Final   Staphylococcus aureus NOT DETECTED NOT DETECTED Final   Streptococcus species NOT DETECTED NOT DETECTED Final   Streptococcus agalactiae NOT DETECTED NOT DETECTED Final   Streptococcus pneumoniae NOT DETECTED NOT DETECTED Final   Streptococcus pyogenes NOT DETECTED NOT DETECTED Final   Acinetobacter baumannii NOT DETECTED NOT DETECTED Final   Enterobacteriaceae species DETECTED (A) NOT DETECTED Final    Comment: Enterobacteriaceae represent a large family of gram-negative bacteria, not a single organism. CRITICAL RESULT CALLED TO, READ BACK BY AND VERIFIED  WITH: N.GOOGOVAC,PHARMD AT 6834 ON 10/27/17 BY G.MCADOO    Enterobacter cloacae complex NOT DETECTED NOT DETECTED Final   Escherichia coli DETECTED (A) NOT DETECTED Final    Comment: CRITICAL RESULT CALLED TO, READ BACK BY AND VERIFIED WITH: N.GOOGOVAC,PHARMD AT 1962 ON 10/27/17 BY G.MCADOO    Klebsiella oxytoca NOT DETECTED NOT DETECTED Final   Klebsiella pneumoniae NOT DETECTED NOT DETECTED Final   Proteus species NOT DETECTED NOT DETECTED Final   Serratia marcescens NOT DETECTED NOT DETECTED Final   Carbapenem resistance NOT DETECTED NOT DETECTED Final   Haemophilus influenzae NOT DETECTED NOT DETECTED Final   Neisseria meningitidis NOT DETECTED NOT DETECTED Final   Pseudomonas aeruginosa NOT DETECTED NOT DETECTED Final   Candida albicans NOT DETECTED NOT DETECTED Final   Candida glabrata NOT DETECTED NOT DETECTED Final   Candida krusei NOT DETECTED NOT DETECTED Final   Candida parapsilosis NOT DETECTED NOT DETECTED Final   Candida tropicalis NOT DETECTED NOT DETECTED Final    Comment: Performed at [facility], [place] [address] [place], [place] [address]  Blood Culture (routine x 2)     Status: Abnormal   Collection Time: 10/26/17  2:25 PM  Result Value Ref Range Status   Specimen Description   Final    BLOOD RIGHT PORTA CATH Performed at [facility] [address], [place], [place] [address]    Special Requests   Final    BOTTLES DRAWN AEROBIC AND ANAEROBIC Blood Culture adequate volume Performed at [facility] [address] [place],  [address]    Culture  Setup Time   Final    GRAM NEGATIVE RODS IN BOTH AEROBIC AND ANAEROBIC BOTTLES CRITICAL VALUE NOTED.  VALUE IS CONSISTENT WITH PREVIOUSLY REPORTED AND CALLED  VALUE.    Culture (A)  Final    ESCHERICHIA COLI SUSCEPTIBILITIES PERFORMED ON PREVIOUS CULTURE WITHIN THE LAST 5 DAYS. Performed at [facility], [place] [address] [place], [place] [address]    Report  Status 10/29/2017 FINAL  Final  Culture, Urine     Status: Abnormal   Collection Time: 10/26/17  3:21 PM  Result Value Ref Range Status   Specimen Description   Final    URINE, CATHETERIZED Performed at [facility] [address], [place], [place] [address]    Special Requests   Final    NONE Performed at [facility], [place] [address] [place], [place] [address]    Culture MULTIPLE SPECIES PRESENT, SUGGEST RECOLLECTION (A)  Final   Report Status 10/27/2017 FINAL  Final  Gastrointestinal Panel by PCR , Stool     Status: None   Collection Time: 10/27/17  6:23 AM  Result Value Ref Range Status   Campylobacter species NOT DETECTED NOT DETECTED Final   Plesimonas shigelloides NOT DETECTED NOT DETECTED Final   Salmonella species NOT DETECTED NOT DETECTED Final   Yersinia enterocolitica NOT DETECTED NOT DETECTED Final   Vibrio species NOT DETECTED NOT DETECTED Final   Vibrio cholerae NOT DETECTED NOT DETECTED Final   Enteroaggregative E coli (EAEC) NOT DETECTED NOT DETECTED Final   Enteropathogenic E coli (EPEC) NOT DETECTED NOT DETECTED Final   Enterotoxigenic E coli (ETEC) NOT DETECTED NOT DETECTED Final   Shiga like toxin producing E coli (STEC) NOT DETECTED NOT DETECTED Final   Shigella/Enteroinvasive E coli (EIEC) NOT DETECTED NOT DETECTED Final   Cryptosporidium NOT DETECTED NOT DETECTED Final   Cyclospora cayetanensis NOT DETECTED NOT DETECTED Final   Entamoeba histolytica NOT DETECTED NOT DETECTED Final   Giardia lamblia NOT DETECTED NOT DETECTED Final   Adenovirus F40/41 NOT DETECTED NOT DETECTED Final   Astrovirus NOT DETECTED NOT DETECTED Final   Norovirus GI/GII NOT DETECTED NOT DETECTED Final   Rotavirus A NOT DETECTED NOT DETECTED Final   Sapovirus (I, II, IV, and V) NOT DETECTED NOT DETECTED Final    Comment: Performed at [facility], [place] [place], [place] [address]  Culture, blood (single)     Status: None  (Preliminary result)   Collection Time: 10/29/17 12:44 PM  Result Value Ref Range Status   Specimen Description   Final    BLOOD RIGHT ARM Performed at [facility], [place] [address] [place], [place] [address]    Special Requests   Final    BOTTLES DRAWN AEROBIC AND ANAEROBIC Blood Culture adequate volume Performed at [facility] [address] [place], [place] [address]    Culture   Final    NO GROWTH < 24 HOURS Performed at [facility] [address], [place], [place] [address]    Report Status PENDING  Incomplete    Labs: BNP (last 3 results) No results for input(s): BNP in the last 8760 hours. Basic Metabolic Panel: Recent Labs  Lab 10/26/17 1356 10/27/17 0615 10/28/17 0538 10/29/17 0605 10/30/17 0435  NA 138 140 141 144 145  K 4.8 3.6 3.4* 4.0 3.6  CL 106 112* 113* 115* 115*  CO2 21* 21* 23 21* 22  GLUCOSE 145* 100* 93 102* 116*  BUN 47* 42* 30* 23 20  CREATININE 2.46* 1.84* 1.48* 1.34* 1.27*  CALCIUM 8.6* 7.7* 8.1* 8.4* 8.5*   Liver Function Tests: Recent Labs  Lab 10/26/17 1356  10/27/17 0615  AST 33 25  ALT 24 16  ALKPHOS 155* 110  BILITOT 1.4* 1.7*  PROT 7.2 5.6*  ALBUMIN 2.7* 2.1*   No results for input(s): LIPASE, AMYLASE in the last 168 hours. No results for input(s): AMMONIA in the last 168 hours. CBC: Recent Labs  Lab 10/26/17 1426 10/27/17 0615 10/28/17 0538 10/29/17 0605 10/30/17 0435  WBC 13.0* 8.4 6.7 7.0 8.5  NEUTROABS 12.0*  --   --   --   --   HGB 10.4* 8.7* 8.9* 9.1* 9.8*  HCT 31.1* 25.7* 27.2* 27.2* 29.6*  MCV 83.2 82.6 82.9 82.4 82.0  PLT 150 107* 124* 131* 153   Cardiac Enzymes: No results for input(s): CKTOTAL, CKMB, CKMBINDEX, TROPONINI in the last 168 hours. BNP: Invalid input(s): POCBNP CBG: No results for input(s): GLUCAP in the last 168 hours. D-Dimer No results for input(s): DDIMER in the last 72 hours. Hgb A1c No results for input(s): HGBA1C in the last 72  hours. Lipid Profile No results for input(s): CHOL, HDL, LDLCALC, TRIG, CHOLHDL, LDLDIRECT in the last 72 hours. Thyroid function studies No results for input(s): TSH, T4TOTAL, T3FREE, THYROIDAB in the last 72 hours.  Invalid input(s): FREET3 Anemia work up No results for input(s): VITAMINB12, FOLATE, FERRITIN, TIBC, IRON, RETICCTPCT in the last 72 hours. Urinalysis    Component Value Date/Time   COLORURINE YELLOW 10/26/2017 1521   APPEARANCEUR TURBID (A) 10/26/2017 1521   LABSPEC 1.017 10/26/2017 1521   PHURINE 5.0 10/26/2017 1521   GLUCOSEU NEGATIVE 10/26/2017 1521   HGBUR SMALL (A) 10/26/2017 1521   BILIRUBINUR SMALL (A) 10/26/2017 1521   BILIRUBINUR Negative 07/06/2014 1502   KETONESUR NEGATIVE 10/26/2017 1521   PROTEINUR 100 (A) 10/26/2017 1521   UROBILINOGEN 0.2 07/06/2014 1502   NITRITE NEGATIVE 10/26/2017 1521   LEUKOCYTESUR LARGE (A) 10/26/2017 1521   Sepsis Labs Invalid input(s): PROCALCITONIN,  WBC,  LACTICIDVEN Microbiology Recent Results (from the past 240 hour(s))  Blood Culture (routine x 2)     Status: Abnormal   Collection Time: 10/26/17  2:11 PM  Result Value Ref Range Status   Specimen Description   Final    BLOOD RIGHT ANTECUBITAL Performed at [facility], [place] [address], [place], [place] [address]    Special Requests   Final    BOTTLES DRAWN AEROBIC AND ANAEROBIC Blood Culture results may not be optimal due to an excessive volume of blood received in culture bottles Performed at [facility] [address], [place], [place] [address]    Culture  Setup Time   Final    GRAM NEGATIVE RODS IN BOTH AEROBIC AND ANAEROBIC BOTTLES CRITICAL RESULT CALLED TO, READ BACK BY AND VERIFIED WITH: N.GOOGOVAC,PHARMD AT 9518 ON 10/27/17 BY G.MCADOO Performed at [facility], [place] [address], [place], [place] [address]    Culture ESCHERICHIA COLI (A)  Final   Report Status 10/29/2017 FINAL  Final   Organism ID, Bacteria  ESCHERICHIA COLI  Final      Susceptibility   Escherichia coli - MIC*    AMPICILLIN >=32 RESISTANT Resistant     CEFAZOLIN <=4 SENSITIVE Sensitive     CEFEPIME <=1 SENSITIVE Sensitive     CEFTAZIDIME <=1 SENSITIVE Sensitive     CEFTRIAXONE <=1 SENSITIVE Sensitive     CIPROFLOXACIN <=0.25 SENSITIVE Sensitive     GENTAMICIN <=1 SENSITIVE Sensitive     IMIPENEM <=0.25 SENSITIVE Sensitive     TRIMETH/SULFA <=20 SENSITIVE Sensitive     AMPICILLIN/SULBACTAM  16 INTERMEDIATE Intermediate     PIP/TAZO <=4 SENSITIVE Sensitive     Extended ESBL NEGATIVE Sensitive     * ESCHERICHIA COLI  Blood Culture ID Panel (Reflexed)     Status: Abnormal   Collection Time: 10/26/17  2:11 PM  Result Value Ref Range Status   Enterococcus species NOT DETECTED NOT DETECTED Final   Listeria monocytogenes NOT DETECTED NOT DETECTED Final   Staphylococcus species NOT DETECTED NOT DETECTED Final   Staphylococcus aureus NOT DETECTED NOT DETECTED Final   Streptococcus species NOT DETECTED NOT DETECTED Final   Streptococcus agalactiae NOT DETECTED NOT DETECTED Final   Streptococcus pneumoniae NOT DETECTED NOT DETECTED Final   Streptococcus pyogenes NOT DETECTED NOT DETECTED Final   Acinetobacter baumannii NOT DETECTED NOT DETECTED Final   Enterobacteriaceae species DETECTED (A) NOT DETECTED Final    Comment: Enterobacteriaceae represent a large family of gram-negative bacteria, not a single organism. CRITICAL RESULT CALLED TO, READ BACK BY AND VERIFIED WITH: N.GOOGOVAC,PHARMD AT 5427 ON 10/27/17 BY G.MCADOO    Enterobacter cloacae complex NOT DETECTED NOT DETECTED Final   Escherichia coli DETECTED (A) NOT DETECTED Final    Comment: CRITICAL RESULT CALLED TO, READ BACK BY AND VERIFIED WITH: N.GOOGOVAC,PHARMD AT 0623 ON 10/27/17 BY G.MCADOO    Klebsiella oxytoca NOT DETECTED NOT DETECTED Final   Klebsiella pneumoniae NOT DETECTED NOT DETECTED Final   Proteus species NOT DETECTED NOT DETECTED Final   Serratia  marcescens NOT DETECTED NOT DETECTED Final   Carbapenem resistance NOT DETECTED NOT DETECTED Final   Haemophilus influenzae NOT DETECTED NOT DETECTED Final   Neisseria meningitidis NOT DETECTED NOT DETECTED Final   Pseudomonas aeruginosa NOT DETECTED NOT DETECTED Final   Candida albicans NOT DETECTED NOT DETECTED Final   Candida glabrata NOT DETECTED NOT DETECTED Final   Candida krusei NOT DETECTED NOT DETECTED Final   Candida parapsilosis NOT DETECTED NOT DETECTED Final   Candida tropicalis NOT DETECTED NOT DETECTED Final    Comment: Performed at [facility], [place] [address], [place], [place] [address]  Blood Culture (routine x 2)     Status: Abnormal   Collection Time: 10/26/17  2:25 PM  Result Value Ref Range Status   Specimen Description   Final    BLOOD RIGHT PORTA CATH Performed at [facility] [address] [place], [place] [address]    Special Requests   Final    BOTTLES DRAWN AEROBIC AND ANAEROBIC Blood Culture adequate volume Performed at [facility] [address] [place], [place] [address]    Culture  Setup Time   Final    GRAM NEGATIVE RODS IN BOTH AEROBIC AND ANAEROBIC BOTTLES CRITICAL VALUE NOTED.  VALUE IS CONSISTENT WITH PREVIOUSLY REPORTED AND CALLED VALUE.    Culture (A)  Final    ESCHERICHIA COLI SUSCEPTIBILITIES PERFORMED ON PREVIOUS CULTURE WITHIN THE LAST 5 DAYS. Performed at [facility], [place] [address], [place], [place] [address]    Report Status 10/29/2017 FINAL  Final  Culture, Urine     Status: Abnormal   Collection Time: 10/26/17  3:21 PM  Result Value Ref Range Status   Specimen Description   Final    URINE, CATHETERIZED Performed at [facility] [address], [place], [place] [address]    Special Requests   Final    NONE Performed at [facility], [place] [address], [place], [place] [address]    Culture MULTIPLE SPECIES PRESENT, SUGGEST RECOLLECTION  (  A)  Final   Report Status 10/27/2017 FINAL  Final  Gastrointestinal Panel by PCR , Stool     Status: None   Collection Time: 10/27/17  6:23 AM  Result Value Ref Range Status   Campylobacter species NOT DETECTED NOT DETECTED Final   Plesimonas shigelloides NOT DETECTED NOT DETECTED Final   Salmonella species NOT DETECTED NOT DETECTED Final   Yersinia enterocolitica NOT DETECTED NOT DETECTED Final   Vibrio species NOT DETECTED NOT DETECTED Final   Vibrio cholerae NOT DETECTED NOT DETECTED Final   Enteroaggregative E coli (EAEC) NOT DETECTED NOT DETECTED Final   Enteropathogenic E coli (EPEC) NOT DETECTED NOT DETECTED Final   Enterotoxigenic E coli (ETEC) NOT DETECTED NOT DETECTED Final   Shiga like toxin producing E coli (STEC) NOT DETECTED NOT DETECTED Final   Shigella/Enteroinvasive E coli (EIEC) NOT DETECTED NOT DETECTED Final   Cryptosporidium NOT DETECTED NOT DETECTED Final   Cyclospora cayetanensis NOT DETECTED NOT DETECTED Final   Entamoeba histolytica NOT DETECTED NOT DETECTED Final   Giardia lamblia NOT DETECTED NOT DETECTED Final   Adenovirus F40/41 NOT DETECTED NOT DETECTED Final   Astrovirus NOT DETECTED NOT DETECTED Final   Norovirus GI/GII NOT DETECTED NOT DETECTED Final   Rotavirus A NOT DETECTED NOT DETECTED Final   Sapovirus (I, II, IV, and V) NOT DETECTED NOT DETECTED Final    Comment: Performed at [facility], [address] [place], [place] [address]  Culture, blood (single)     Status: None (Preliminary result)   Collection Time: 10/29/17 12:44 PM  Result Value Ref Range Status   Specimen Description   Final    BLOOD RIGHT ARM Performed at [facility], [place] [address] [place], [place] [address]    Special Requests   Final    BOTTLES DRAWN AEROBIC AND ANAEROBIC Blood Culture adequate volume Performed at [facility] [address] [place], [place] [address]    Culture   Final    NO GROWTH < 24  HOURS Performed at [facility] [address] [place],  [address]    Report Status PENDING  Incomplete   Time coordinating discharge: 35 minutes  SIGNED:  Kerney Elbe, DO [facility] 10/30/2017, 5:40 PM Pager is on [place]  If 7PM-7AM, please contact night-coverage [email or web address] Password [email or web address]

## 2017-10-30 NOTE — Progress Notes (Signed)
Katherine Boone   DOB:08/01/1945   SR#:[id number]    Assessment & Plan:   Metastatic cervical cancer She has decline in performance status I recommend she completes her palliative radiation therapy Due to her poor performance status, I do not believe she would be strong enough for future palliative chemotherapy I recommend continue aggressive supportive care and for her to complete radiation therapy.  Recurrent UTI/bacteremia She has responded well to intravenous antibiotic treatment Recommend we continue the same Repeat blood culture is pending. I told her if she will be taking a risk to go home today before blood culture is reported as negative. She is willing to take the risk.  I recommend minimum 7 to 10 days of antibiotics to ensure complete eradication of bacteremia  Severe anemia Multifactorial, likely due to recent infection, bone marrow suppression from antibiotics treatment and side effects of radiation therapy with ongoing vaginal bleeding She is symptomatic I recommend blood transfusion to keep hemoglobin greater than 8  Acute on chronic renal failure, resolving Secondary to dehydration and recent sepsis It is improving with IV fluids. Continue the same  Significant fatigue and shortness of breath Chest x-ray showedsignificant pulmonary disease and she is anemic Continue supportive care  CODE STATUS DNR  Discharge planning I recommend a few more days of intravenous antibiotics to ensure clearance of bacteremia before discharge, hopefully by the end of the week. I recommend she completes her radiation therapy if possible while she is hospitalized However, the patient is adamant she wants to go home. She is willing to take the risk of recurrent bacteremia if she is discharged prematurely today I will contact the primary service to arrange for her to be discharged at the end of the day per patient request We discussed follow-up.  The patient wants to go to the  beach to settle some issues surrounding her beach house She will contact me for future follow-up once she has returned to [facility] I will sign off  Heath Lark, MD 10/30/2017  8:18 AM   Subjective:  She felt better.  She wants to go home.  She has been afebrile.  No evidence of leukocytosis.  Repeat blood culture from 10/29/2017 is pending She asked these questions, "do I really have to stay for IV antibiotics?" " Do I really have to finish radiation?"  Objective:  Vitals:   10/30/17 0555 10/30/17 0817  BP: 115/70   Pulse: 90   Resp: 14   Temp: 98.9 F (37.2 C)   SpO2: 96% 95%     Intake/Output Summary (Last 24 hours) at 10/30/2017 0818 Last data filed at 10/29/2017 2320 Gross per 24 hour  Intake 10 ml  Output -  Net 10 ml    GENERAL:alert, no distress and comfortable NEURO: alert & oriented x 3 with fluent speech, no focal motor/sensory deficits   Labs:  Lab Results  Component Value Date   WBC 8.5 10/30/2017   HGB 9.8 (L) 10/30/2017   HCT 29.6 (L) 10/30/2017   MCV 82.0 10/30/2017   PLT 153 10/30/2017   NEUTROABS 12.0 (H) 10/26/2017    Lab Results  Component Value Date   NA 145 10/30/2017   K 3.6 10/30/2017   CL 115 (H) 10/30/2017   CO2 22 10/30/2017

## 2017-10-31 ENCOUNTER — Ambulatory Visit
Admission: RE | Admit: 2017-10-31 | Discharge: 2017-10-31 | Disposition: A | Discharge status: Home / Self Care | Payer: Medicare HMO | Source: Ambulatory Visit | Attending: Radiation Oncology | Admitting: Radiation Oncology

## 2017-10-31 ENCOUNTER — Telehealth: Payer: Self-pay | Admitting: *Deleted

## 2017-10-31 ENCOUNTER — Ambulatory Visit: Payer: Medicare HMO

## 2017-10-31 DIAGNOSIS — Z510 Encounter for antineoplastic radiation therapy: Secondary | ICD-10-CM | POA: Diagnosis not present

## 2017-10-31 DIAGNOSIS — C539 Malignant neoplasm of cervix uteri, unspecified: Secondary | ICD-10-CM | POA: Diagnosis not present

## 2017-10-31 NOTE — Telephone Encounter (Signed)
Transition Care Management Follow-up Telephone Call   Date discharged? 10/30/2017   How have you been since you were released from the hospital? "a little weak"   Do you understand why you were in the hospital? yes   Do you understand the discharge instructions? yes   Where were you discharged to? home   Items Reviewed:  Medications reviewed: yes  Allergies reviewed: yes  Dietary changes reviewed: yes  Referrals reviewed: yes   Functional Questionnaire:   Activities of Daily Living (ADLs):   She states they are independent in the following: independent in all areas   Any transportation issues/concerns?: no   Any patient concerns? no   Confirmed importance and date/time of follow-up visits scheduled yes  Provider Appointment booked with Dr Darnell Level 11/06/17  Confirmed with patient if condition begins to worsen call PCP or go to the ER.  Patient was given the office number and encouraged to call back with question or concerns.  : yes

## 2017-11-01 ENCOUNTER — Ambulatory Visit: Payer: Medicare HMO

## 2017-11-03 LAB — CULTURE, BLOOD (SINGLE)
Culture: NO GROWTH
SPECIAL REQUESTS: ADEQUATE

## 2017-11-05 ENCOUNTER — Ambulatory Visit: Payer: Medicare HMO

## 2017-11-05 ENCOUNTER — Telehealth: Payer: Self-pay

## 2017-11-05 NOTE — Telephone Encounter (Signed)
Contacted pt re: missed radiation treatment today. Inquired if pt had planned on rescheduling last radiation treatment. Pt stated "no", that she did not intend to finish last radiation treatment. Pt reports she is feeling very "weak and tired" and states that she is only able to walk household distances before becoming so fatigued that she needs to rest. Conveyed to pt that this RN would have a one month f/u appointment scheduled with Dr. Sondra Come. Pt verbalized understanding and agreement. Loma Sousa, RN BSN

## 2017-11-06 ENCOUNTER — Ambulatory Visit (INDEPENDENT_AMBULATORY_CARE_PROVIDER_SITE_OTHER): Payer: Medicare HMO | Admitting: Family Medicine

## 2017-11-06 ENCOUNTER — Ambulatory Visit: Payer: Medicare HMO

## 2017-11-06 ENCOUNTER — Encounter: Payer: Self-pay | Admitting: Family Medicine

## 2017-11-06 VITALS — BP 114/62 | HR 113 | Temp 98.2°F | Ht 65.0 in | Wt 143.5 lb

## 2017-11-06 DIAGNOSIS — R5383 Other fatigue: Secondary | ICD-10-CM | POA: Diagnosis not present

## 2017-11-06 DIAGNOSIS — Z933 Colostomy status: Secondary | ICD-10-CM | POA: Diagnosis not present

## 2017-11-06 DIAGNOSIS — N898 Other specified noninflammatory disorders of vagina: Secondary | ICD-10-CM | POA: Diagnosis not present

## 2017-11-06 DIAGNOSIS — E440 Moderate protein-calorie malnutrition: Secondary | ICD-10-CM | POA: Diagnosis not present

## 2017-11-06 DIAGNOSIS — C539 Malignant neoplasm of cervix uteri, unspecified: Secondary | ICD-10-CM

## 2017-11-06 DIAGNOSIS — C799 Secondary malignant neoplasm of unspecified site: Secondary | ICD-10-CM | POA: Diagnosis not present

## 2017-11-06 DIAGNOSIS — N3000 Acute cystitis without hematuria: Secondary | ICD-10-CM

## 2017-11-06 DIAGNOSIS — Z7189 Other specified counseling: Secondary | ICD-10-CM | POA: Diagnosis not present

## 2017-11-06 DIAGNOSIS — A419 Sepsis, unspecified organism: Secondary | ICD-10-CM | POA: Diagnosis not present

## 2017-11-06 DIAGNOSIS — R6520 Severe sepsis without septic shock: Secondary | ICD-10-CM

## 2017-11-06 NOTE — Patient Instructions (Addendum)
Good to see you today.  I will place referral to hospice at home to come out and talk/evaluate.  Call for follow up with Dr Alvy Bimler.  Continue staying hydrated - small sips of fluids throughout the day, 3 small meals a day, snacks in between.  Consider boost or ensure protein supplements.  Return in 1 month for follow up visit.

## 2017-11-06 NOTE — Progress Notes (Signed)
BP 114/62 (BP Location: Left Arm, Patient Position: Sitting, Cuff Size: Normal)   Pulse (!) 113   Temp 98.2 F (36.8 C) (Oral)   Ht 5' 5"  (1.651 m)   Wt 143 lb 8 oz (65.1 kg)   SpO2 96%   BMI 23.88 kg/m    CC: hosp f/u visit Subjective:    Patient ID: Katherine Boone, female    DOB: 1945/11/11, 72 y.o.   MRN: [id number]  HPI: Katherine Boone is a 72 y.o. female presenting on 11/06/2017 for Hospitalization Follow-up (Pt states she still feels weak. )   Recent hospitalization for nausea/ weakness in setting of metastatic cervical cancer found to have UTI. Fever to 102 was treated with vanc/cefepime, IVF --> ceftriaxone IV. E coli bacteremia found. Discharged with PO keflex - taking TID to complete 10d course. Sent home with diflucan as well. Chronic urinary incontinence. Some radiation enteritis with loose stools, has colostomy.   Unable to complete last radiation treatment (scheduled yesterday) due to worsening weakness/fatigue. Had 25 radiation treatments total. Doesn't want to finish last course. Doesn't think she'd want to pursue chemotherapy either. Continues with white vaginal discharge, albeit some better after radiation.   Generalized weakness.  Appetite down. She feels she's staying hydrated. Decreased urination.  Latest CT 09/2017 showed new lesions in liver, mediastinal and retroperitoneal LAD, progression of cervical soft tissue. CXR 10/2017 showed new pulm mets.   Denies any pain.  She left hospital earlier than recommended because she wanted to go to the beach to sell her house - however was unable to make the trip due to weakness and fatigue.   Admit date: 10/26/2017 Discharge date: 10/30/2017 TCM f/u phone call performed 10/31/2017.   Admitted From: Home Disposition: Home  Recommendations for Outpatient Follow-up:  1. Follow up with PCP in 1-2 weeks 2. Follow up with Oncology Dr. Alvy Bimler within 1 week 3. Please obtain CMP/CBC, Mag, Phos in one week 4. Please follow up  on the following pending results:  Home Health: Home Equipment/Devices: None   Discharge Condition: Stable CODE STATUS: FULL CODE  Discharge Diagnoses:  Principal Problem:   Severe sepsis ([place]) Active Problems:   Protein calorie malnutrition ([place])   Colostomy in place [facility])   Cervical cancer ([place])   Chronic kidney disease (CKD), stage IV (severe) (HCC)   AKI (acute kidney injury) ([place])   Anemia   Sepsis ([place])   Malnutrition of moderate degree  Relevant past medical, surgical, family and social history reviewed and updated as indicated. Interim medical history since our last visit reviewed. Allergies and medications reviewed and updated. D/C meds reconciled with current medication list Facility-Administered Medications Prior to Visit  Medication Dose Route Frequency Provider Last Rate Last Dose  . heparin lock flush 100 unit/mL  500 Units Intravenous Once Livesay, Lennis P, MD      . sodium chloride flush (NS) 0.9 % injection 10 mL  10 mL Intravenous PRN Livesay, Lennis P, MD      . sodium chloride flush (NS) 0.9 % injection 10 mL  10 mL Intracatheter PRN Alvy Bimler, Ni, MD   10 mL at 03/29/17 1658   Outpatient Medications Prior to Visit  Medication Sig Dispense Refill  . acetaminophen (TYLENOL) 325 MG tablet Take 2 tablets (650 mg total) by mouth every 6 (six) hours as needed for mild pain (or Fever >/= 101). 30 tablet 0  . cephALEXin (KEFLEX) 500 MG capsule Take 1 capsule (500 mg total) by mouth every 8 (eight)  hours for 30 doses. 30 capsule 0  . fluconazole (DIFLUCAN) 150 MG tablet Take 1 tablet (150 mg total) by mouth daily. (Patient not taking: Reported on 11/08/2017) 2 tablet 0  . lidocaine-prilocaine (EMLA) cream Apply 1 application topically as needed. (Patient not taking: Reported on 11/08/2017) 30 g 6  . nystatin (MYCOSTATIN/NYSTOP) powder Apply topically 3 (three) times daily. 15 g 0  . omeprazole (PRILOSEC) 20 MG capsule TAKE 1 CAPSULE BY MOUTH EVERY DAY (Patient taking  differently: Take 20 mg by mouth as needed (acid reflux). ) 90 capsule 0  . ondansetron (ZOFRAN) 8 MG tablet Take 1 tablet (8 mg total) by mouth every 8 (eight) hours as needed for nausea. (Patient not taking: Reported on 11/08/2017) 30 tablet 3  . saccharomyces boulardii (FLORASTOR) 250 MG capsule Take 1 capsule (250 mg total) by mouth 2 (two) times daily. 60 capsule 0     Per HPI unless specifically indicated in ROS section below Review of Systems     Objective:    BP 114/62 (BP Location: Left Arm, Patient Position: Sitting, Cuff Size: Normal)   Pulse (!) 113   Temp 98.2 F (36.8 C) (Oral)   Ht 5' 5"  (1.651 m)   Wt 143 lb 8 oz (65.1 kg)   SpO2 96%   BMI 23.88 kg/m   Wt Readings from Last 3 Encounters:  11/06/17 143 lb 8 oz (65.1 kg)  10/26/17 148 lb 5.9 oz (67.3 kg)  09/19/17 156 lb 12.8 oz (71.1 kg)    Physical Exam  Constitutional: No distress.  Tired appearing, weak  HENT:  Head: Normocephalic and atraumatic.  Mouth/Throat: Oropharynx is clear and moist. No oropharyngeal exudate.  Eyes: Pupils are equal, round, and reactive to light. EOM are normal.  Neck: Normal range of motion. Neck supple.  Cardiovascular: Regular rhythm and normal heart sounds. Tachycardia present.  No murmur heard. Pulmonary/Chest: Effort normal and breath sounds normal. No respiratory distress. She has no wheezes. She has no rales.  Musculoskeletal: She exhibits no edema.  Neurological: She is alert.  Skin: Skin is warm and dry. No rash noted.  Psychiatric: She has a normal mood and affect.  Nursing note and vitals reviewed.   Lab Results  Component Value Date   WBC 7.8 11/09/2017   HGB 8.9 (L) 11/09/2017   HCT 28.1 (L) 11/09/2017   MCV 84.9 11/09/2017   PLT 216 11/09/2017    Lab Results  Component Value Date   CREATININE 1.05 (H) 11/09/2017   BUN 12 11/09/2017   NA 140 11/09/2017   K 3.7 11/09/2017   CL 107 11/09/2017   CO2 25 11/09/2017    Lab Results  Component Value Date    ALT 16 10/27/2017   AST 25 10/27/2017   ALKPHOS 110 10/27/2017   BILITOT 1.7 (H) 10/27/2017    Lab Results  Component Value Date   LABPROT 13.7 06/10/2015   Lab Results  Component Value Date   TSH 1.384 08/23/2016       Assessment & Plan:   Problem List Items Addressed This Visit    Vaginal discharge    Ongoing, mildly better after palliattive radiation course. Pt had 1 more radiation therapy to complete course but has decided against finishing due to poor tolerability of radiation.       UTI (urinary tract infection) - Primary    Recent hospitalization for UTI with sepsis with E coli bacteremia. Finishing keflex course, urinary symptoms seem to be improving.  Severe sepsis (HCC)   Protein calorie malnutrition (HCC)    Marked weight loss. Encouraged nutritional supplement.      Other fatigue    Discussed concern for cancer progression causing worsening fatigue.       Metastatic cancer [facility])   Relevant Orders   Ambulatory referral to Hospice   Colostomy in place [facility])   Cervical cancer [facility])    Unfortunately cancer has progressed as of latest imaging 09/2017.  Pt not interested in completing radiation, states she is not interested in pursuing further chemotherapy at this time.  She is aware of poor prognosis. Today we discussed hospice and palliative care involvement - she feels she currently has all her needs met at home with a supportive significant other, but agrees to hospice at home evaluation.  She will however call and schedule follow up with Dr Alvy Bimler. Will forward note to Dr Alvy Bimler as Juluis Rainier.       Relevant Orders   Ambulatory referral to Hospice   Advanced care planning/counseling discussion    Hospice referral process started today.          No orders of the defined types were placed in this encounter.  Orders Placed This Encounter  Procedures  . Ambulatory referral to Hospice    Referral Priority:   Routine    Referral Type:   Consultation     Referral Reason:   Specialty Services Required    Requested Specialty:   Hospice Services    Number of Visits Requested:   1    Follow up plan: Return in about 4 weeks (around 12/04/2017) for follow up visit.  [name], MD

## 2017-11-07 ENCOUNTER — Encounter: Payer: Self-pay | Admitting: Radiation Oncology

## 2017-11-07 NOTE — Progress Notes (Signed)
  Radiation Oncology         [phone] ________________________________  Name: Katherine Boone MRN: [id number]  Date: 11/07/2017  DOB: 04/26/1945  End of Treatment Note  Diagnosis:   Malignant neoplasm of cervix with metastases to lungs and liver     Indication for treatment:  Palliative       Radiation treatment dates:   09/25/17 - 10/31/17  Site/dose:  Cervix/ received 43.2 Gy in 24 fractions of prescribed 45 Gy in 25 fractions.  Beams/energy:   3D, Photon/ 15X  Narrative: The patient tolerated radiation treatment relatively well. Throughout treatment, she reported white, sticky vaginal discharge that had an odor, with occasional blood noted around the middle of treatments. She also denied pain and vomiting, and reported nausea, weakness, self-resolving abdominal discomfort, and moderate fatigue throughout treatments. She noted continual decrease in appetite, eating less, trouble staying hydrated, diarrhea, and weight loss. She denied dysuria, but reported urinary urgency and incontinence. She was admitted to the hospital on 10/26/17 for sepsis, and decided to forgo her final treatment due to not feeling well.  Plan: The patient has completed radiation treatment. The patient will return to radiation oncology clinic for routine followup in one month. I advised them to call or return sooner if they have any questions or concerns related to their recovery or treatment.  -----------------------------------  Blair Promise, PhD, MD  This document serves as a record of services personally performed by Gery Pray, MD. It was created on his behalf by Wilburn Mylar, a trained medical scribe. The creation of this record is based on the scribe's personal observations and the provider's statements to them. This document has been checked and approved by the attending provider.

## 2017-11-08 ENCOUNTER — Emergency Department (HOSPITAL_COMMUNITY): Payer: Medicare HMO

## 2017-11-08 ENCOUNTER — Other Ambulatory Visit: Payer: Self-pay

## 2017-11-08 ENCOUNTER — Observation Stay (HOSPITAL_COMMUNITY)
Admission: EM | Admit: 2017-11-08 | Discharge: 2017-11-10 | Disposition: A | Discharge status: Home / Self Care | Payer: Medicare HMO | Attending: Internal Medicine | Admitting: Internal Medicine

## 2017-11-08 ENCOUNTER — Encounter (HOSPITAL_COMMUNITY): Payer: Self-pay | Admitting: Emergency Medicine

## 2017-11-08 DIAGNOSIS — N3000 Acute cystitis without hematuria: Secondary | ICD-10-CM | POA: Diagnosis not present

## 2017-11-08 DIAGNOSIS — Z79899 Other long term (current) drug therapy: Secondary | ICD-10-CM | POA: Insufficient documentation

## 2017-11-08 DIAGNOSIS — N390 Urinary tract infection, site not specified: Principal | ICD-10-CM | POA: Diagnosis present

## 2017-11-08 DIAGNOSIS — D638 Anemia in other chronic diseases classified elsewhere: Secondary | ICD-10-CM | POA: Diagnosis not present

## 2017-11-08 DIAGNOSIS — C799 Secondary malignant neoplasm of unspecified site: Secondary | ICD-10-CM

## 2017-11-08 DIAGNOSIS — Z515 Encounter for palliative care: Secondary | ICD-10-CM

## 2017-11-08 DIAGNOSIS — R531 Weakness: Secondary | ICD-10-CM

## 2017-11-08 DIAGNOSIS — G4700 Insomnia, unspecified: Secondary | ICD-10-CM | POA: Diagnosis present

## 2017-11-08 DIAGNOSIS — R918 Other nonspecific abnormal finding of lung field: Secondary | ICD-10-CM | POA: Diagnosis not present

## 2017-11-08 DIAGNOSIS — E860 Dehydration: Secondary | ICD-10-CM

## 2017-11-08 DIAGNOSIS — C539 Malignant neoplasm of cervix uteri, unspecified: Secondary | ICD-10-CM | POA: Diagnosis not present

## 2017-11-08 DIAGNOSIS — Z7189 Other specified counseling: Secondary | ICD-10-CM

## 2017-11-08 DIAGNOSIS — R112 Nausea with vomiting, unspecified: Secondary | ICD-10-CM | POA: Diagnosis present

## 2017-11-08 DIAGNOSIS — R1900 Intra-abdominal and pelvic swelling, mass and lump, unspecified site: Secondary | ICD-10-CM | POA: Diagnosis present

## 2017-11-08 DIAGNOSIS — C7989 Secondary malignant neoplasm of other specified sites: Secondary | ICD-10-CM | POA: Diagnosis present

## 2017-11-08 LAB — I-STAT CG4 LACTIC ACID, ED
LACTIC ACID, VENOUS: 1.74 mmol/L (ref 0.5–1.9)
LACTIC ACID, VENOUS: 1.51 mmol/L (ref 0.5–1.9)

## 2017-11-08 LAB — CBC
HCT: 32.1 % — ABNORMAL LOW (ref 36.0–46.0)
Hemoglobin: 10.4 g/dL — ABNORMAL LOW (ref 12.0–15.0)
MCH: 27.2 pg (ref 26.0–34.0)
MCHC: 32.4 g/dL (ref 30.0–36.0)
MCV: 84 fL (ref 78.0–100.0)
PLATELETS: 245 10*3/uL (ref 150–400)
RBC: 3.82 MIL/uL — AB (ref 3.87–5.11)
RDW: 16.7 % — AB (ref 11.5–15.5)
WBC: 9.9 10*3/uL (ref 4.0–10.5)

## 2017-11-08 LAB — URINALYSIS, ROUTINE W REFLEX MICROSCOPIC
Bilirubin Urine: NEGATIVE
GLUCOSE, UA: NEGATIVE mg/dL
KETONES UR: 5 mg/dL — AB
Nitrite: NEGATIVE
PH: 5 (ref 5.0–8.0)
Protein, ur: 100 mg/dL — AB
Specific Gravity, Urine: 1.019 (ref 1.005–1.030)
WBC, UA: >50 WBC/hpf — ABNORMAL HIGH (ref 0–5)

## 2017-11-08 LAB — BASIC METABOLIC PANEL
ANION GAP: 10 (ref 5–15)
BUN: 15 mg/dL (ref 8–23)
CALCIUM: 8.5 mg/dL — AB (ref 8.9–10.3)
CO2: 26 mmol/L (ref 22–32)
Chloride: 104 mmol/L (ref 98–111)
Creatinine, Ser: 0.94 mg/dL (ref 0.44–1.00)
GFR calc non Af Amer: 59 mL/min — ABNORMAL LOW (ref >60)
Glucose, Bld: 112 mg/dL — ABNORMAL HIGH (ref 70–99)
Potassium: 3.8 mmol/L (ref 3.5–5.1)
Sodium: 140 mmol/L (ref 135–145)

## 2017-11-08 LAB — CBG MONITORING, ED: Glucose-Capillary: 98 mg/dL (ref 70–99)

## 2017-11-08 MED ORDER — MIRTAZAPINE 15 MG PO TBDP
15.0000 mg | ORAL_TABLET | Freq: Every day | ORAL | Status: DC
  Administered 2017-11-08 – 2017-11-09 (×2): 15 mg via ORAL
  Filled 2017-11-08 (×2): qty 1

## 2017-11-08 MED ORDER — LEVOFLOXACIN IN D5W 500 MG/100ML IV SOLN
500.0000 mg | Freq: Once | INTRAVENOUS | Status: AC
  Administered 2017-11-08: 500 mg via INTRAVENOUS
  Filled 2017-11-08: qty 100

## 2017-11-08 MED ORDER — SODIUM CHLORIDE 0.9 % IV SOLN: 250.0000 mL | INTRAVENOUS | Status: DC | PRN

## 2017-11-08 MED ORDER — ALBUTEROL SULFATE (2.5 MG/3ML) 0.083% IN NEBU: 2.5000 mg | INHALATION_SOLUTION | RESPIRATORY_TRACT | Status: DC | PRN

## 2017-11-08 MED ORDER — ONDANSETRON HCL 4 MG PO TABS: 4.0000 mg | ORAL_TABLET | ORAL | Status: DC | PRN

## 2017-11-08 MED ORDER — ACETAMINOPHEN 325 MG PO TABS: 650.0000 mg | ORAL_TABLET | Freq: Four times a day (QID) | ORAL | Status: DC | PRN

## 2017-11-08 MED ORDER — SODIUM CHLORIDE 0.9 % IV BOLUS
1000.0000 mL | Freq: Once | INTRAVENOUS | Status: AC
  Administered 2017-11-08: 1000 mL via INTRAVENOUS

## 2017-11-08 MED ORDER — TRAZODONE HCL 50 MG PO TABS: 50.0000 mg | ORAL_TABLET | Freq: Every evening | ORAL | Status: DC | PRN

## 2017-11-08 MED ORDER — PANTOPRAZOLE SODIUM 40 MG PO TBEC
40.0000 mg | DELAYED_RELEASE_TABLET | Freq: Every day | ORAL | Status: DC
  Administered 2017-11-08 – 2017-11-10 (×3): 40 mg via ORAL
  Filled 2017-11-08 (×3): qty 1

## 2017-11-08 MED ORDER — ONDANSETRON HCL 4 MG/2ML IJ SOLN: 4.0000 mg | Freq: Four times a day (QID) | INTRAMUSCULAR | Status: DC | PRN

## 2017-11-08 MED ORDER — SACCHAROMYCES BOULARDII 250 MG PO CAPS
250.0000 mg | ORAL_CAPSULE | Freq: Two times a day (BID) | ORAL | Status: DC
  Administered 2017-11-08 – 2017-11-10 (×4): 250 mg via ORAL
  Filled 2017-11-08 (×4): qty 1

## 2017-11-08 MED ORDER — HEPARIN SODIUM (PORCINE) 5000 UNIT/ML IJ SOLN: 5000.0000 [IU] | Freq: Three times a day (TID) | INTRAMUSCULAR | Status: DC

## 2017-11-08 MED ORDER — MORPHINE SULFATE (PF) 2 MG/ML IV SOLN: 2.0000 mg | INTRAVENOUS | Status: DC | PRN

## 2017-11-08 MED ORDER — SODIUM CHLORIDE 0.9% FLUSH
3.0000 mL | Freq: Two times a day (BID) | INTRAVENOUS | Status: DC
  Administered 2017-11-10: 3 mL via INTRAVENOUS

## 2017-11-08 MED ORDER — ONDANSETRON HCL 4 MG/2ML IJ SOLN
4.0000 mg | Freq: Once | INTRAMUSCULAR | Status: AC
  Administered 2017-11-08: 4 mg via INTRAVENOUS
  Filled 2017-11-08: qty 2

## 2017-11-08 MED ORDER — ONDANSETRON HCL 4 MG PO TABS: 4.0000 mg | ORAL_TABLET | Freq: Four times a day (QID) | ORAL | Status: DC | PRN

## 2017-11-08 MED ORDER — ACETAMINOPHEN 650 MG RE SUPP: 650.0000 mg | Freq: Four times a day (QID) | RECTAL | Status: DC | PRN

## 2017-11-08 MED ORDER — OXYCODONE HCL 5 MG PO TABS: 5.0000 mg | ORAL_TABLET | ORAL | Status: DC | PRN

## 2017-11-08 MED ORDER — SODIUM CHLORIDE 0.9% FLUSH: 3.0000 mL | INTRAVENOUS | Status: DC | PRN

## 2017-11-08 MED ORDER — LEVOFLOXACIN IN D5W 750 MG/150ML IV SOLN
750.0000 mg | INTRAVENOUS | Status: DC
  Administered 2017-11-08: 750 mg via INTRAVENOUS
  Filled 2017-11-08 (×2): qty 150

## 2017-11-08 MED ORDER — DEXTROSE-NACL 5-0.45 % IV SOLN
INTRAVENOUS | Status: DC
  Administered 2017-11-08 – 2017-11-09 (×2): via INTRAVENOUS

## 2017-11-08 MED ORDER — MIRTAZAPINE 15 MG PO TBDP: 7.5000 mg | ORAL_TABLET | Freq: Every day | ORAL | Status: DC

## 2017-11-08 MED ORDER — POLYETHYLENE GLYCOL 3350 17 G PO PACK: 17.0000 g | PACK | Freq: Every day | ORAL | Status: DC | PRN

## 2017-11-08 NOTE — ED Triage Notes (Signed)
Pt c/o weakness and dehydration. Pt states she was recently admitted with dehydration and weakness r/t sepsis and states she continues to have difficulty taking po and c/o nausea.

## 2017-11-08 NOTE — ED Notes (Signed)
ED TO INPATIENT HANDOFF REPORT  Name/Age/Gender Katherine Boone 72 y.o. female  Code Status    Code Status Orders  (From admission, onward)         Start     Ordered   11/08/17 1643  Do not attempt resuscitation (DNR)  Continuous    Question Answer Comment  In the event of cardiac or respiratory ARREST Do not call a "code blue"   In the event of cardiac or respiratory ARREST Do not perform Intubation, CPR, defibrillation or ACLS   In the event of cardiac or respiratory ARREST Use medication by any route, position, wound care, and other measures to relive pain and suffering. May use oxygen, suction and manual treatment of airway obstruction as needed for comfort.   Comments ENd of life care      11/08/17 1642        Code Status History    Date Active Date Inactive Code Status Order ID Comments User Context   11/08/2017 1642 11/08/2017 1642 Full Code [id number]  Roxan Hockey, MD ED   10/26/2017 1657 10/30/2017 2042 DNR [id number]  Debbe Odea, MD ED   05/16/2015 0038 05/23/2015 1907 Full Code [id number]  Theressa Millard, MD Inpatient      Home/SNF/Other Home  Chief Complaint weakness  Level of Care/Admitting Diagnosis ED Disposition    ED Disposition Condition [facility]: [facility] [[id number]]  Level of Care: Med-Surg [16]  Diagnosis: UTI (urinary tract infection) [979892]  Admitting Physician: Morrison Old  Attending Physician: Morrison Old  Bed request comments: med  PT Class (Do Not Modify): Observation [104]  PT Acc Code (Do Not Modify): Observation [10022]       Medical History Past Medical History:  Diagnosis Date  . Cancer (HCC)    Cervical  . Family history of cancer    stomach, colon, lung, throat  . GERD (gastroesophageal reflux disease)    diet controlled  . Gout 03/2005   ?  Marland Kitchen Large bowel obstruction ([place]) 05/28/2015  . Migraines    none recently  . Osteoporosis 02/2011   DEXA  12/12 - Lspine -2.6; femur -3.8  . Small bowel obstruction (HCC)     Allergies No Known Allergies  IV Location/Drains/Wounds Patient Lines/Drains/Airways Status   Active Line/Drains/Airways    Name:   Placement date:   Placement time:   Site:   Days:   Peripheral IV 11/08/17 Right Antecubital   11/08/17    1410    Antecubital   less than 1   Colostomy LUQ   05/18/15    1200    LUQ   905   Ileostomy   -    -    -      Incision (Closed) 05/18/15 Abdomen Other (Comment)   05/18/15    1157     905   Incision - 3 Ports Abdomen Umbilicus Right;Mid Mid;Lower   05/18/15    1137     905          Labs/Imaging Results for orders placed or performed during the hospital encounter of 11/08/17 (from the past 48 hour(s))  Basic metabolic panel     Status: Abnormal   Collection Time: 11/08/17  1:58 PM  Result Value Ref Range   Sodium 140 135 - 145 mmol/L   Potassium 3.8 3.5 - 5.1 mmol/L   Chloride 104 98 - 111 mmol/L   CO2 26 22 - 32 mmol/L  Glucose, Bld 112 (H) 70 - 99 mg/dL   BUN 15 8 - 23 mg/dL   Creatinine, Ser 0.94 0.44 - 1.00 mg/dL   Calcium 8.5 (L) 8.9 - 10.3 mg/dL   GFR calc non Af Amer 59 (L) >60 mL/min   GFR calc Af Amer >60 >60 mL/min    Comment: (NOTE) The eGFR has been calculated using the CKD EPI equation. This calculation has not been validated in all clinical situations. eGFR's persistently <60 mL/min signify possible Chronic Kidney Disease.    Anion gap 10 5 - 15    Comment: Performed at [facility], [place] [address], [place], [place] [address]  CBC     Status: Abnormal   Collection Time: 11/08/17  1:58 PM  Result Value Ref Range   WBC 9.9 4.0 - 10.5 K/uL   RBC 3.82 (L) 3.87 - 5.11 MIL/uL   Hemoglobin 10.4 (L) 12.0 - 15.0 g/dL   HCT 32.1 (L) 36.0 - 46.0 %   MCV 84.0 78.0 - 100.0 fL   MCH 27.2 26.0 - 34.0 pg   MCHC 32.4 30.0 - 36.0 g/dL   RDW 16.7 (H) 11.5 - 15.5 %   Platelets 245 150 - 400 K/uL    Comment: Performed at [facility], [place] [address], [place], [place] [address]  Urinalysis, Routine w reflex microscopic     Status: Abnormal   Collection Time: 11/08/17  1:59 PM  Result Value Ref Range   Color, Urine YELLOW YELLOW   APPearance TURBID (A) CLEAR   Specific Gravity, Urine 1.019 1.005 - 1.030   pH 5.0 5.0 - 8.0   Glucose, UA NEGATIVE NEGATIVE mg/dL   Hgb urine dipstick MODERATE (A) NEGATIVE   Bilirubin Urine NEGATIVE NEGATIVE   Ketones, ur 5 (A) NEGATIVE mg/dL   Protein, ur 100 (A) NEGATIVE mg/dL   Nitrite NEGATIVE NEGATIVE   Leukocytes, UA LARGE (A) NEGATIVE   RBC / HPF >50 (H) 0 - 5 RBC/hpf   WBC, UA >50 (H) 0 - 5 WBC/hpf   Bacteria, UA MANY (A) NONE SEEN   Squamous Epithelial / LPF 0-5 0 - 5   WBC Clumps PRESENT     Comment: Performed at [facility], [place] [address] [place],  [address]  CBG monitoring, ED     Status: None   Collection Time: 11/08/17  2:07 PM  Result Value Ref Range   Glucose-Capillary 98 70 - 99 mg/dL  I-Stat CG4 Lactic Acid, ED     Status: None   Collection Time: 11/08/17  2:08 PM  Result Value Ref Range   Lactic Acid, Venous 1.74 0.5 - 1.9 mmol/L  I-Stat CG4 Lactic Acid, ED     Status: None   Collection Time: 11/08/17  4:02 PM  Result Value Ref Range   Lactic Acid, Venous 1.51 0.5 - 1.9 mmol/L   Dg Chest 2 View  Result Date: 11/08/2017 CLINICAL DATA:  Fever weakness and dehydration EXAM: CHEST - 2 VIEW COMPARISON:  10/26/2017, CT chest 09/13/2017 FINDINGS: Right-sided central venous port tip over the SVC. Innumerable bilateral metastatic lung nodules, appears increased in number and size. Stable cardiomediastinal silhouette. No pneumothorax. Old deformity proximal right humerus. Lower thoracic compression fractures are unchanged. IMPRESSION: Multiple metastatic lung nodules, appear increased in number, some are cavitary. No acute focal airspace disease is seen. Electronically Signed   By: Donavan Foil M.D.   On: 11/08/2017 16:32    Pending  Labs [employer] (From  admission, onward)    Start     Ordered   11/09/17 5927  Basic metabolic panel  Tomorrow morning,   R     11/08/17 1641   11/09/17 0500  CBC  Tomorrow morning,   R     11/08/17 1641   11/08/17 1518  Urine culture  STAT,   STAT     11/08/17 1517   11/08/17 1514  Culture, blood (routine x 2)  BLOOD CULTURE X 2,   STAT     11/08/17 1514          Vitals/Pain Today's Vitals   11/08/17 1417 11/08/17 1430 11/08/17 1445 11/08/17 1500  BP: 104/74 106/72  110/76  Pulse: (!) 107 (!) 105 (!) 104 (!) 104  Resp: 14 (!) 25 (!) 26 10  Temp:      TempSrc:      SpO2: 95% 93% 93% 94%  PainSc:        Isolation Precautions No active isolations  Medications Medications  pantoprazole (PROTONIX) EC tablet 40 mg (has no administration in time range)  saccharomyces boulardii (FLORASTOR) capsule 250 mg (has no administration in time range)  sodium chloride flush (NS) 0.9 % injection 3 mL (has no administration in time range)  sodium chloride flush (NS) 0.9 % injection 3 mL (has no administration in time range)  0.9 %  sodium chloride infusion (has no administration in time range)  acetaminophen (TYLENOL) tablet 650 mg (has no administration in time range)    Or  acetaminophen (TYLENOL) suppository 650 mg (has no administration in time range)  traZODone (DESYREL) tablet 50 mg (has no administration in time range)  polyethylene glycol (MIRALAX / GLYCOLAX) packet 17 g (has no administration in time range)  albuterol (PROVENTIL) (2.5 MG/3ML) 0.083% nebulizer solution 2.5 mg (has no administration in time range)  heparin injection 5,000 Units (has no administration in time range)  dextrose 5 %-0.45 % sodium chloride infusion (has no administration in time range)  morphine 2 MG/ML injection 2 mg (has no administration in time range)  oxyCODONE (Oxy IR/ROXICODONE) immediate release tablet 5 mg (has no administration in time range)  mirtazapine (REMERON SOL-TAB) disintegrating  tablet 15 mg (has no administration in time range)  ondansetron (ZOFRAN) tablet 4 mg (has no administration in time range)    Or  ondansetron (ZOFRAN) injection 4 mg (has no administration in time range)  ondansetron (ZOFRAN) injection 4 mg (has no administration in time range)  sodium chloride 0.9 % bolus 1,000 mL (0 mLs Intravenous Stopped 11/08/17 1754)  levofloxacin (LEVAQUIN) IVPB 500 mg (0 mg Intravenous Stopped 11/08/17 1754)    Mobility walks with person assist

## 2017-11-08 NOTE — H&P (Signed)
Patient Demographics:    Katherine Boone, is a 72 y.o. 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 coli bacteremia UTI, ED provider started patient on Levaquin today we will continue this for now pending blood and urine cultures  2) anorexia/insomnia----try Remeron 15 mg nightly, get dietitian consult, liberalize her diet  3) anemia of chronic disease-in the setting of underlying malignancy with prior chemoradiation therapy, H&H is currently close to her baseline no evidence of ongoing bleeding monitor closely  4)Radiation enteritis--- status post colostomy  5)Metastatic Cervical malignancy (stage IV cervical cancer)-patient believes she actually has a variant and not cervical cancer , we will  defer to oncology, patient declines further chemo or radiation therapy she is looking forward to enrolling in hospice  6)Social/Ethics--patient is a DNR/DNI patient would like a significant order of many years to be her main decision maker but she does not have an official POA papers, please get palliative and hospice consult while in the hospital  With History of - Reviewed by me  Past Medical History:  Diagnosis Date  . Cancer (HCC)    Cervical  . Family history of cancer    stomach, colon, lung, throat  . GERD (gastroesophageal reflux disease)    diet controlled  . Gout 03/2005   ?  Marland Kitchen Large bowel obstruction ([place]) 05/28/2015  . Migraines    none recently  . Osteoporosis 02/2011   DEXA 12/12 - Lspine -2.6; femur -3.8  . Small bowel obstruction [facility])       Past Surgical History:  Procedure Laterality Date  . APPENDECTOMY  1960  . LAPAROSCOPIC DIVERTED COLOSTOMY N/A 05/18/2015   Procedure: LAPAROSCOPIC DIVERTED LOOP COLOSTOMY ;  Surgeon: Leighton Ruff, MD;  Location: [facility] ORS;  Service: General;  Laterality: N/A;      Chief Complaint  Patient presents with  . Weakness  . Dehydration      HPI:    Katherine Boone  is a 72 y.o. female with medical history ofcervical cancer on palliative chemo and radiation,colostomy,CKD IV who was treated for pansensitive E. coli UTI and bacteremia from blood culture that grew on 10/26/2017 with Rocephin and now currently completing Keflex returns today with UTI symptoms, complains of urinary frequency and urgency but no hematuria, she does have some dysuria, no flank pain  In ED UA suggest possible UTI, ED provider started patient on Levaquin pending blood and urine cultures  No fevers no chills she does have nausea with dry heaves  Additional history obtained from patient's significant other at bedside  Patient states that she does  not desire further chemo or radiation therapy she wants to talk to palliative services and wants to enrol in hospice  As per patient's significant other patient's appetite and sleep has been very poor     Review of systems:    In addition to the HPI above,   A full Review of  Systems was done, all other systems reviewed are negative except as noted above in HPI , .    Social History:  Reviewed by me    Social History   Tobacco Use  . Smoking status: Never Smoker  . Smokeless tobacco: Never Used  Substance Use Topics  . Alcohol use: No     Family History :  Reviewed by me    Family History  Problem Relation Age of Onset  . Heart disease Mother   . Hypertension Mother   . Cancer Mother 23       colon and stomach  . Cancer Brother        stomach and throat, smoker  .  Hypertension Sister   . Cancer Brother        stomach and lung, smoker  . COPD Brother   . Cancer Father        stomach cancer  . Ovarian cancer Unknown        maternal neice  . Throat cancer Brother   . Stroke Sister   . Cancer Other        niece - stomach cancer  . Diabetes Neg Hx   . Coronary artery disease Neg Hx      Home Medications:   Prior to Admission medications   Medication Sig Start Date End Date Taking? Authorizing Provider  acetaminophen (TYLENOL) 325 MG tablet Take 2 tablets (650 mg total) by mouth every 6 (six) hours as needed for mild pain (or Fever >/= 101). 10/30/17  Yes Sheikh, Omair Latif, DO  cephALEXin (KEFLEX) 500 MG capsule Take 1 capsule (500 mg total) by mouth every 8 (eight) hours for 30 doses. 10/30/17 11/09/17 Yes Sheikh, Omair Latif, DO  nystatin (MYCOSTATIN/NYSTOP) powder Apply topically 3 (three) times daily. 10/30/17  Yes Sheikh, Omair Latif, DO  omeprazole (PRILOSEC) 20 MG capsule TAKE 1 CAPSULE BY MOUTH EVERY DAY Patient taking differently: Take 20 mg by mouth as needed (acid reflux).  10/01/16  Yes Katherine Bush, MD  saccharomyces boulardii (FLORASTOR) 250 MG capsule Take 1 capsule (250 mg total) by mouth 2 (two) times daily. 10/30/17  Yes Sheikh, Omair Latif, DO  fluconazole (DIFLUCAN) 150 MG tablet Take 1 tablet (150 mg total) by mouth daily. Patient not taking: Reported on 11/08/2017 10/31/17   Raiford Noble Latif, DO  lidocaine-prilocaine (EMLA) cream Apply 1 application topically as needed. Patient not taking: Reported on 11/08/2017 03/14/17   Heath Lark, MD  ondansetron (ZOFRAN) 8 MG tablet Take 1 tablet (8 mg total) by mouth every 8 (eight) hours as needed for nausea. Patient not taking: Reported on 11/08/2017 03/14/17   Heath Lark, MD     Allergies:    No Known Allergies   Physical Exam:   Vitals  Blood pressure 121/67, pulse (!) 105, temperature 98.9 F (37.2 C), temperature source Oral, resp. rate 17, SpO2 98 %.  Physical Examination:  General appearance - alert, chronically ill appearing, and in no distress a Mental status - alert, oriented to person, place, and time,  Eyes - sclera anicteric Neck - supple, no JVD elevation , Chest - clear  to auscultation bilaterally, symmetrical air movement,  Heart - S1 and S2 normal, regular Abdomen - soft, nontender, nondistended, colostomy bag with liquid fecal content, no CVA area tenderness Neurological - screening mental status exam normal, generalized weakness without new focal deficits Extremities - no pedal edema noted, intact peripheral pulses  Skin - warm, dry     Data Review:    CBC Recent Labs  Lab 11/08/17 1358  WBC 9.9  HGB 10.4*  HCT 32.1*  PLT 245  MCV 84.0  MCH 27.2  MCHC 32.4  RDW 16.7*   ------------------------------------------------------------------------------------------------------------------  Chemistries  Recent Labs  Lab 11/08/17 1358  NA 140  K 3.8  CL 104  CO2 26  GLUCOSE 112*  BUN 15  CREATININE 0.94  CALCIUM 8.5*   ------------------------------------------------------------------------------------------------------------------ estimated creatinine clearance is 48.7 mL/min (by C-G formula based on SCr of 0.94 mg/dL). ------------------------------------------------------------------------------------------------------------------ No results for input(s): TSH, T4TOTAL, T3FREE, THYROIDAB in the last 72 hours.  Invalid input(s): FREET3   Coagulation profile No results for input(s): INR, PROTIME in the last 168 hours. ------------------------------------------------------------------------------------------------------------------- No results for input(s): DDIMER in the last 72 hours. -------------------------------------------------------------------------------------------------------------------  Cardiac Enzymes No results for input(s): CKMB, TROPONINI, MYOGLOBIN in the last 168 hours.  Invalid input(s):  CK ------------------------------------------------------------------------------------------------------------------ No results found for: BNP   ---------------------------------------------------------------------------------------------------------------  Urinalysis    Component Value Date/Time   COLORURINE YELLOW 11/08/2017 1359   APPEARANCEUR TURBID (A) 11/08/2017 1359   LABSPEC 1.019 11/08/2017 1359   PHURINE 5.0 11/08/2017 1359   GLUCOSEU NEGATIVE 11/08/2017 1359   HGBUR MODERATE (A) 11/08/2017 1359   BILIRUBINUR NEGATIVE 11/08/2017 1359   BILIRUBINUR Negative 07/06/2014 1502   KETONESUR 5 (A) 11/08/2017 1359   PROTEINUR 100 (A) 11/08/2017 1359   UROBILINOGEN 0.2 07/06/2014 1502   NITRITE NEGATIVE 11/08/2017 1359   LEUKOCYTESUR LARGE (A) 11/08/2017 1359   ----------------------------------------------------------------------------------------------------------------   Imaging Results:    Dg Chest 2 View  Result Date: 11/08/2017 CLINICAL DATA:  Fever weakness and dehydration EXAM: CHEST - 2 VIEW COMPARISON:  10/26/2017, CT chest 09/13/2017 FINDINGS: Right-sided central venous port tip over the SVC. Innumerable bilateral metastatic lung nodules, appears increased in number and size. Stable cardiomediastinal silhouette. No pneumothorax. Old deformity proximal right humerus. Lower thoracic compression fractures are unchanged. IMPRESSION: Multiple metastatic lung nodules, appear increased in number, some are cavitary. No acute focal airspace disease is seen. Electronically Signed   By: Donavan Foil M.D.   On: 11/08/2017 16:32    Radiological Exams on Admission: Dg Chest 2 View  Result Date: 11/08/2017 CLINICAL DATA:  Fever weakness and dehydration EXAM: CHEST - 2 VIEW COMPARISON:  10/26/2017, CT chest 09/13/2017 FINDINGS: Right-sided central venous port tip over the SVC. Innumerable bilateral metastatic lung nodules, appears increased in number and size. Stable cardiomediastinal  silhouette. No pneumothorax. Old deformity proximal right humerus. Lower thoracic compression fractures are unchanged. IMPRESSION: Multiple metastatic lung nodules, appear increased in number, some are cavitary. No acute focal airspace disease is seen. Electronically Signed   By: Donavan Foil M.D.   On: 11/08/2017 16:32   DVT Prophylaxis -SCD   AM Labs Ordered, also please review Full Orders  Family Communication: Admission, patients condition and plan of care including tests being ordered have been discussed with the patient and s/o at bedside who indicate understanding and agree with the plan   Code Status - Full Code  Likely DC to  home  Condition   stable  Roxan Hockey M.D on 11/08/2017 at 6:47 PM Pager---[phone] Go to [email or web address] - password [email or web address] for contact info  [facility] - Office  [phone]

## 2017-11-08 NOTE — Progress Notes (Signed)
Pharmacy Antibiotic Note  Katherine Boone is a 72 y.o. female with metastatic cervical cancer on chemo and radiation admitted on 11/08/2017 with recurrent UTIs, recently treated with Rocephin >> Keflex at relatively high doses (for concurrent E coli bacteremia). Today reports new UTI symptoms.  Pharmacy has been consulted for Levaquin dosing. Note E coli in blood/urine at last visit did demonstrate susceptibility to fluoroquinolones.  Plan:  Levaquin 750 mg IV q48 hr; recommended duration for complicated UTI is 4-2L  If patient continues to have recurrent E coli infections, may want to consider ID consult to check for occult source of infection    Temp (24hrs), Avg:98.8 F (37.1 C), Min:98.6 F (37 C), Max:98.9 F (37.2 C)  Recent Labs  Lab 11/08/17 1358 11/08/17 1408 11/08/17 1602  WBC 9.9  --   --   CREATININE 0.94  --   --   LATICACIDVEN  --  1.74 1.51    Estimated Creatinine Clearance: 48.7 mL/min (by C-G formula based on SCr of 0.94 mg/dL).    No Known Allergies    Thank you for allowing pharmacy to be a part of this patient's care.  Reuel Boom, PharmD, BCPS [phone] 11/08/2017, 8:02 PM

## 2017-11-08 NOTE — Progress Notes (Signed)
Attempted to call for report for second time, there was no answer in the ED.

## 2017-11-08 NOTE — ED Provider Notes (Signed)
[facility] DEPT Provider Note   CSN: [id number] Arrival date & time: 11/08/17  1320     History   Chief Complaint Chief Complaint  Patient presents with  . Weakness  . Dehydration    HPI Katherine Boone is a 72 y.o. female.  HPI Patient with metastatic cervical cancer.  Recent admission in the hospital for E. coli bacteremia.  Currently on Keflex.  Has had some chills at home.  Has some nausea and vomiting.  Decreased oral intake.  Generalized weakness and difficulty doing activity at home.  No frank fevers.  Still has some dysuria and white discharge.  Patient states her oncologist is given her 2 months to live.  Her PCP was going to set up hospice.  Patient thinks she needs fluids. Past Medical History:  Diagnosis Date  . Cancer (HCC)    Cervical  . Family history of cancer    stomach, colon, lung, throat  . GERD (gastroesophageal reflux disease)    diet controlled  . Gout 03/2005   ?  Marland Kitchen Large bowel obstruction ([place]) 05/28/2015  . Migraines    none recently  . Osteoporosis 02/2011   DEXA 12/12 - Lspine -2.6; femur -3.8  . Small bowel obstruction [facility])     Patient Active Problem List   Diagnosis Date Noted  . Malnutrition of moderate degree 10/29/2017  . Severe sepsis ([place]) 10/26/2017  . AKI (acute kidney injury) ([place]) 10/26/2017  . Anemia 10/26/2017  . Sepsis ([place]) 10/26/2017  . Vaginal discharge 09/09/2017  . Cancer associated pain 07/04/2017  . Fistula, bladder 04/19/2017  . Dysuria 04/12/2017  . Cellulitis and abscess of foot 12/11/2016  . Immunocompromised ([place]) 12/11/2016  . Chronic kidney disease (CKD), stage IV (severe) ([place]) 11/22/2016  . Other fatigue 08/16/2016  . Pancytopenia, acquired ([place]) 06/14/2016  . Goals of care, counseling/discussion 05/22/2016  . Cervical cancer ([place]) 05/21/2016  . Chemotherapy-induced peripheral neuropathy ([place]) 12/17/2015  . Vitamin D deficiency 12/16/2015  . Port catheter in place  06/25/2015  . Poor venous access 06/11/2015  . Colostomy in place [facility]) 06/11/2015  . Protein calorie malnutrition ([place]) 05/28/2015  . 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 Metastases to the liver ([place]) 05/28/2015  . Chemotherapy induced neutropenia ([place])   . Constipation by outlet obstruction 05/15/2015  . Malignant neoplasm metastatic to right lung ([place]) 05/06/2015  . Malignant neoplasm metastatic to left lung ([place]) 05/06/2015  . International Federation of Gynecology and Obstetrics (FIGO) stage IVB malignant neoplasm of cervix ([place]) 05/06/2015  . Pelvic mass in female   . Other constipation 04/26/2015  . Metastatic cancer ([place]) 04/26/2015  . Health maintenance examination 07/06/2014  . Advanced care planning/counseling discussion 07/06/2014  . Renal insufficiency 07/06/2014  . HLD (hyperlipidemia) 06/29/2014  . Osteoporosis 02/03/2011  . Urinary urgency 01/02/2011  . Medicare annual wellness visit, initial 01/02/2011  . Insomnia 01/02/2011  . GERD (gastroesophageal reflux disease)   . Family history of cancer     Past Surgical History:  Procedure Laterality Date  . APPENDECTOMY  1960  . LAPAROSCOPIC DIVERTED COLOSTOMY N/A 05/18/2015   Procedure: LAPAROSCOPIC DIVERTED LOOP COLOSTOMY ;  Surgeon: Leighton Ruff, MD;  Location: [facility] ORS;  Service: General;  Laterality: N/A;     OB History   None      Home Medications    Prior to Admission medications   Medication Sig Start Date End Date Taking? Authorizing Provider  acetaminophen (TYLENOL) 325 MG tablet Take 2 tablets (650  mg total) by mouth every 6 (six) hours as needed for mild pain (or Fever >/= 101). 10/30/17  Yes Sheikh, Omair Latif, DO  cephALEXin (KEFLEX) 500 MG capsule Take 1 capsule (500 mg total) by mouth every 8 (eight) hours for 30 doses. 10/30/17 11/09/17 Yes Sheikh, Omair Latif, DO  fluconazole (DIFLUCAN) 150 MG tablet Take 1 tablet (150 mg total) by mouth daily. Patient not taking: Reported on 11/08/2017  10/31/17   Raiford Noble Latif, DO  lidocaine-prilocaine (EMLA) cream Apply 1 application topically as needed. 03/14/17   Heath Lark, MD  nystatin (MYCOSTATIN/NYSTOP) powder Apply topically 3 (three) times daily. 10/30/17   Raiford Noble Latif, DO  omeprazole (PRILOSEC) 20 MG capsule TAKE 1 CAPSULE BY MOUTH EVERY DAY Patient taking differently: Take 20 mg by mouth as needed (acid reflux).  10/01/16   [name], MD  ondansetron (ZOFRAN) 8 MG tablet Take 1 tablet (8 mg total) by mouth every 8 (eight) hours as needed for nausea. 03/14/17   Heath Lark, MD  saccharomyces boulardii (FLORASTOR) 250 MG capsule Take 1 capsule (250 mg total) by mouth 2 (two) times daily. 10/30/17   Kerney Elbe, DO    Family History Family History  Problem Relation Age of Onset  . Heart disease Mother   . Hypertension Mother   . Cancer Mother 3       colon and stomach  . Cancer Brother        stomach and throat, smoker  . Hypertension Sister   . Cancer Brother        stomach and lung, smoker  . COPD Brother   . Cancer Father        stomach cancer  . Ovarian cancer Unknown        maternal neice  . Throat cancer Brother   . Stroke Sister   . Cancer Other        niece - stomach cancer  . Diabetes Neg Hx   . Coronary artery disease Neg Hx     Social History Social History   Tobacco Use  . Smoking status: Never Smoker  . Smokeless tobacco: Never Used  Substance Use Topics  . Alcohol use: No  . Drug use: No     Allergies   Patient has no known allergies.   Review of Systems Review of Systems  Constitutional: Positive for appetite change and fatigue.  HENT: Negative for congestion.   Respiratory: Positive for shortness of breath.   Cardiovascular: Negative for chest pain.  Gastrointestinal: Positive for abdominal pain.  Genitourinary: Positive for dysuria.  Musculoskeletal: Negative for back pain.  Skin: Negative for rash.  Neurological: Positive for weakness.    Psychiatric/Behavioral: Negative for confusion.     Physical Exam Updated Vital Signs BP 110/76   Pulse (!) 104   Temp 98.6 F (37 C) (Oral)   Resp 10   SpO2 94%   Physical Exam  Constitutional: She appears well-developed.  HENT:  Head: Normocephalic.  Eyes: Pupils are equal, round, and reactive to light.  Neck: Neck supple.  Cardiovascular:  Mild tachycardia  Pulmonary/Chest: Effort normal.  Abdominal: Soft.  Colostomy in left lower abdomen  Musculoskeletal: She exhibits edema. She exhibits no tenderness.  Skin: Skin is warm. Capillary refill takes less than 2 seconds.     ED Treatments / Results  Labs (all labs ordered are listed, but only abnormal results are displayed) Labs Reviewed  BASIC METABOLIC PANEL - Abnormal; Notable for the following components:  Result Value   Glucose, Bld 112 (*)    Calcium 8.5 (*)    GFR calc non Af Amer 59 (*)    All other components within normal limits  CBC - Abnormal; Notable for the following components:   RBC 3.82 (*)    Hemoglobin 10.4 (*)    HCT 32.1 (*)    RDW 16.7 (*)    All other components within normal limits  URINALYSIS, ROUTINE W REFLEX MICROSCOPIC - Abnormal; Notable for the following components:   APPearance TURBID (*)    Hgb urine dipstick MODERATE (*)    Ketones, ur 5 (*)    Protein, ur 100 (*)    Leukocytes, UA LARGE (*)    RBC / HPF >50 (*)    WBC, UA >50 (*)    Bacteria, UA MANY (*)    All other components within normal limits  CULTURE, BLOOD (ROUTINE X 2)  CULTURE, BLOOD (ROUTINE X 2)  URINE CULTURE  CBG MONITORING, ED  I-STAT CG4 LACTIC ACID, ED  I-STAT CG4 LACTIC ACID, ED    EKG None  Radiology No results found.  Procedures Procedures (including critical care time)  Medications Ordered in ED Medications  sodium chloride 0.9 % bolus 1,000 mL (has no administration in time range)  levofloxacin (LEVAQUIN) IVPB 500 mg (has no administration in time range)     Initial Impression /  Assessment and Plan / ED Course  I have reviewed the triage vital signs and the nursing notes.  Pertinent labs & imaging results that were available during my care of the patient were reviewed by me and considered in my medical decision making (see chart for details).     Patient with terminal cancer.  Also recurrent or continued UTI.  Already on Keflex.  Reviewed previous sensitivities.  Will treat with Levaquin.  No blood in urine culture sent.  Discussed with Rhea Pink from palliative care.  Will get inpatient consult.  Will admit to internal medicine service for IV fluids and antibiotics and goals of care/outpatient treatment discussion.  Final Clinical Impressions(s) / ED Diagnoses   Final diagnoses:  Urinary tract infection without hematuria, site unspecified  Dehydration  Weakness  Encounter for hospice care discussion    ED Discharge Orders    None       Davonna Belling, MD 11/08/17 1523

## 2017-11-08 NOTE — ED Notes (Signed)
Phone report given to Priddy, Therapist, sports.

## 2017-11-08 NOTE — Progress Notes (Signed)
Called to receive report from ED RN, RN stated that she would have to call me back as she was drawing blood.

## 2017-11-09 DIAGNOSIS — Z515 Encounter for palliative care: Secondary | ICD-10-CM | POA: Diagnosis not present

## 2017-11-09 DIAGNOSIS — E860 Dehydration: Secondary | ICD-10-CM | POA: Diagnosis not present

## 2017-11-09 DIAGNOSIS — D638 Anemia in other chronic diseases classified elsewhere: Secondary | ICD-10-CM | POA: Diagnosis not present

## 2017-11-09 DIAGNOSIS — Z7189 Other specified counseling: Secondary | ICD-10-CM

## 2017-11-09 DIAGNOSIS — N3000 Acute cystitis without hematuria: Secondary | ICD-10-CM

## 2017-11-09 DIAGNOSIS — G4700 Insomnia, unspecified: Secondary | ICD-10-CM | POA: Diagnosis not present

## 2017-11-09 DIAGNOSIS — C799 Secondary malignant neoplasm of unspecified site: Secondary | ICD-10-CM | POA: Diagnosis not present

## 2017-11-09 DIAGNOSIS — N390 Urinary tract infection, site not specified: Secondary | ICD-10-CM | POA: Diagnosis not present

## 2017-11-09 DIAGNOSIS — C7989 Secondary malignant neoplasm of other specified sites: Secondary | ICD-10-CM | POA: Diagnosis not present

## 2017-11-09 DIAGNOSIS — C539 Malignant neoplasm of cervix uteri, unspecified: Secondary | ICD-10-CM | POA: Diagnosis not present

## 2017-11-09 DIAGNOSIS — Z79899 Other long term (current) drug therapy: Secondary | ICD-10-CM | POA: Diagnosis not present

## 2017-11-09 LAB — CBC
HCT: 28.1 % — ABNORMAL LOW (ref 36.0–46.0)
Hemoglobin: 8.9 g/dL — ABNORMAL LOW (ref 12.0–15.0)
MCH: 26.9 pg (ref 26.0–34.0)
MCHC: 31.7 g/dL (ref 30.0–36.0)
MCV: 84.9 fL (ref 78.0–100.0)
Platelets: 216 10*3/uL (ref 150–400)
RBC: 3.31 MIL/uL — ABNORMAL LOW (ref 3.87–5.11)
RDW: 16.8 % — AB (ref 11.5–15.5)
WBC: 7.8 10*3/uL (ref 4.0–10.5)

## 2017-11-09 LAB — BASIC METABOLIC PANEL
Anion gap: 8 (ref 5–15)
BUN: 12 mg/dL (ref 8–23)
CHLORIDE: 107 mmol/L (ref 98–111)
CO2: 25 mmol/L (ref 22–32)
Calcium: 8.2 mg/dL — ABNORMAL LOW (ref 8.9–10.3)
Creatinine, Ser: 1.05 mg/dL — ABNORMAL HIGH (ref 0.44–1.00)
GFR calc Af Amer: 60 mL/min — ABNORMAL LOW (ref >60)
GFR, EST NON AFRICAN AMERICAN: 52 mL/min — AB (ref >60)
GLUCOSE: 103 mg/dL — AB (ref 70–99)
Potassium: 3.7 mmol/L (ref 3.5–5.1)
SODIUM: 140 mmol/L (ref 135–145)

## 2017-11-09 LAB — URINE CULTURE

## 2017-11-09 LAB — FOLATE: Folate: 4 ng/mL — ABNORMAL LOW (ref >5.9)

## 2017-11-09 LAB — IRON AND TIBC
Iron: 44 ug/dL (ref 28–170)
Saturation Ratios: 34 % — ABNORMAL HIGH (ref 10.4–31.8)
TIBC: 128 ug/dL — ABNORMAL LOW (ref 250–450)
UIBC: 84 ug/dL

## 2017-11-09 LAB — VITAMIN B12: Vitamin B-12: 354 pg/mL (ref 180–914)

## 2017-11-09 LAB — FERRITIN: FERRITIN: 751 ng/mL — AB (ref 11–307)

## 2017-11-09 MED ORDER — FOLIC ACID 1 MG PO TABS
1.0000 mg | ORAL_TABLET | Freq: Every day | ORAL | Status: DC
  Administered 2017-11-09 – 2017-11-10 (×2): 1 mg via ORAL
  Filled 2017-11-09 (×2): qty 1

## 2017-11-09 NOTE — Consult Note (Signed)
Consultation Note Date: 11/09/2017   Patient Name: Katherine Boone  DOB: 07/07/1945  MRN: [id number]  Age / Sex: 72 y.o., female  PCP: [name], MD Referring Physician: Eugenie Filler, MD  Reason for Consultation: Establishing goals of care  HPI/Patient Profile: 72 y.o. female admitted on 11/08/2017    Clinical Assessment and Goals of Care:  72 year old lady with a past medical history of metastatic cervical cancer, initially diagnosed in 2017. Patient states that she was told by her medical oncologist that she only has 2 months more to live probably. She became dehydrated and had to be admitted again to the hospital. She has decided on no more treatments and reportedly was to have hospice services at home. This has not been started yet.  Patient has been admitted with dehydration and recurrent urinary tract infection. Past medical course recently also complicated by anorexia, insomnia, and radiation enteritis.   A palliative medicine consultation has been requested for goals of care discussions.  Patient is awake alert resting in bed. I introduced myself and palliative care as follows: Palliative medicine is specialized medical care for people living with serious illness. It focuses on providing relief from the symptoms and stress of a serious illness. The goal is to improve quality of life for both the patient and the family.  Goals wishes and values discussed. Discussed about comfort measures per discussed about hospice philosophy of care in detail. Different types of hospice support discussed in detail. Patient states she wishes to go home. She is accepting of hospice support at home. She states that she has mild abdominal discomfort for which she takes Tylenol at home. Her current pain level is 3-4/10. She has a colostomy bag. She lives in [place], [place].   Patient states she does not need a  hospital bed. She has a cane but she doesn't need to use it, states she ambulates fine at home. Her oral intake is moderate. Agree with trying Remeron.   See additional discussions/recommendations below. Brief life review performed. Patient states that she had 2 children both are deceased. One child died shortly after birth. She had another son who had cerebral palsy and he died when he was 72 years old.   Will recommend case management to initiate home with hospice arrangements. Thank you for the consult.  NEXT OF KIN significant other Jim Vickers [phone].   SUMMARY OF RECOMMENDATIONS   DNR DNI Treat the treatable, such as recent UTI, dehydration. Home with hospice support. Patient lives at home in [place], [place] with her friend/ significant other Terrance Mass [phone]. Prognosis likely few weeks to some very limited number of months at this point.  Thank you for the consult.   Code Status/Advance Care Planning:  DNR    Symptom Management:      Palliative Prophylaxis:   Delirium Protocol   Psycho-social/Spiritual:   Desire for further Chaplaincy support:yes  Additional Recommendations: Education on Hospice  Prognosis:   < 3 months  Discharge Planning: Home  with Hospice      Primary Diagnoses: Present on Admission: . UTI (urinary tract infection) . Insomnia . Pelvic mass in female . 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 The following aspects are pertinent.  Past Medical History:  Diagnosis Date  . Cancer (HCC)    Cervical  . Family history of cancer    stomach, colon, lung, throat  . GERD (gastroesophageal reflux disease)    diet controlled  . Gout 03/2005   ?  Marland Kitchen Large bowel obstruction ([place]) 05/28/2015  . Migraines    none recently  . Osteoporosis 02/2011   DEXA 12/12 - Lspine -2.6; femur -3.8  . Small bowel obstruction (HCC)    Social  History   Socioeconomic History  . Marital status: Divorced    Spouse name: Not on file  . Number of children: 2  . Years of education: Not on file  . Highest education level: Not on file  Occupational History    Employer: [employer]  Social Needs  . Financial resource strain: Not on file  . Food insecurity:    Worry: Not on file    Inability: Not on file  . Transportation needs:    Medical: Not on file    Non-medical: Not on file  Tobacco Use  . Smoking status: Never Smoker  . Smokeless tobacco: Never Used  Substance and Sexual Activity  . Alcohol use: No  . Drug use: No  . Sexual activity: Never  Lifestyle  . Physical activity:    Days per week: Not on file    Minutes per session: Not on file  . Stress: Not on file  Relationships  . Social connections:    Talks on phone: Not on file    Gets together: Not on file    Attends religious service: Not on file    Active member of club or organization: Not on file    Attends meetings of clubs or organizations: Not on file    Relationship status: Not on file  Other Topics Concern  . 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 Heart disease Mother   . Hypertension Mother   . Cancer Mother 108       colon and stomach  . Cancer Brother        stomach and throat, smoker  . Hypertension Sister   . Cancer Brother        stomach and lung, smoker  . COPD Brother   . Cancer Father        stomach cancer  . Ovarian cancer Unknown        maternal neice  . Throat cancer Brother   . Stroke Sister   . Cancer Other         niece - stomach cancer  . Diabetes Neg Hx   . Coronary artery disease Neg Hx    Scheduled Meds: . heparin  5,000 Units Subcutaneous Q8H  .  mirtazapine  15 mg Oral QHS  . pantoprazole  40 mg Oral Daily  . saccharomyces boulardii  250 mg Oral BID  . sodium chloride flush  3 mL Intravenous Q12H   Continuous Infusions: . sodium chloride    . dextrose 5 % and 0.45% NaCl 100 mL/hr at 11/09/17 0530  . levofloxacin (LEVAQUIN) IV Stopped (11/08/17 2155)   PRN Meds:.sodium chloride, acetaminophen **OR** acetaminophen, albuterol, morphine injection, ondansetron **OR** ondansetron (ZOFRAN) IV, oxyCODONE, polyethylene glycol, sodium chloride flush, traZODone Medications Prior to Admission:  Prior to Admission medications   Medication Sig Start Date End Date Taking? Authorizing Provider  acetaminophen (TYLENOL) 325 MG tablet Take 2 tablets (650 mg total) by mouth every 6 (six) hours as needed for mild pain (or Fever >/= 101). 10/30/17  Yes Sheikh, Omair Latif, DO  cephALEXin (KEFLEX) 500 MG capsule Take 1 capsule (500 mg total) by mouth every 8 (eight) hours for 30 doses. 10/30/17 11/09/17 Yes Sheikh, Omair Latif, DO  nystatin (MYCOSTATIN/NYSTOP) powder Apply topically 3 (three) times daily. 10/30/17  Yes Sheikh, Omair Latif, DO  omeprazole (PRILOSEC) 20 MG capsule TAKE 1 CAPSULE BY MOUTH EVERY DAY Patient taking differently: Take 20 mg by mouth as needed (acid reflux).  10/01/16  Yes [name], MD  saccharomyces boulardii (FLORASTOR) 250 MG capsule Take 1 capsule (250 mg total) by mouth 2 (two) times daily. 10/30/17  Yes Sheikh, Omair Latif, DO  fluconazole (DIFLUCAN) 150 MG tablet Take 1 tablet (150 mg total) by mouth daily. Patient not taking: Reported on 11/08/2017 10/31/17   Raiford Noble Latif, DO  lidocaine-prilocaine (EMLA) cream Apply 1 application topically as needed. Patient not taking: Reported on 11/08/2017 03/14/17   Heath Lark, MD  ondansetron (ZOFRAN) 8 MG tablet Take 1 tablet (8 mg  total) by mouth every 8 (eight) hours as needed for nausea. Patient not taking: Reported on 11/08/2017 03/14/17   Heath Lark, MD   No Known Allergies Review of Systems Mild pain RUQ  Physical Exam  General appearance - awake alert, chronically ill appearing, and in no distress  Mental status - alert, oriented to person, place, and time,  Eyes - sclera anicteric Neck - supple, no JVD elevation , Chest - clear  to auscultation bilaterally, symmetrical air movement,  Heart - S1 and S2 normal, regular Abdomen - soft, mild RUQ and side tender, nondistended, colostomy bag with liquid fecal content, Neurological -  generalized weakness without new focal deficits Extremities - no pedal edema noted, intact peripheral pulses  Skin - warm, dry Vital Signs: BP 113/72 (BP Location: Left Arm)   Pulse (!) 104   Temp 98.3 F (36.8 C) (Oral)   Resp 14   SpO2 96%  Pain Scale: 0-10   Pain Score: 0-No pain   SpO2: SpO2: 96 % O2 Device:SpO2: 96 % O2 Flow Rate: .   IO: Intake/output summary:   Intake/Output Summary (Last 24 hours) at 11/09/2017 0919 Last data filed at 11/09/2017 0300 Gross per 24 hour  Intake 1834.68 ml  Output -  Net 1834.68 ml    LBM: Last BM Date: 11/08/17 Baseline Weight:   Most recent weight:       Palliative Assessment/Data:   PPS 50%  Time In:  8.20  Time Out:  9.20 Time Total:  60 min  Greater than 50%  of this time was spent counseling and coordinating care related to the above assessment and plan.  Signed by: Loistine Chance, MD [phone]   Please contact Palliative  Medicine Team phone at [phone] for questions and concerns.  For individual provider: See Shea Evans

## 2017-11-09 NOTE — Assessment & Plan Note (Signed)
Hospice referral process started today.

## 2017-11-09 NOTE — Assessment & Plan Note (Deleted)
Unfortunately this has progressed. See below.

## 2017-11-09 NOTE — Assessment & Plan Note (Signed)
Ongoing, mildly better after palliattive radiation course. Pt had 1 more radiation therapy to complete course but has decided against finishing due to poor tolerability of radiation.

## 2017-11-09 NOTE — Assessment & Plan Note (Signed)
Discussed concern for cancer progression causing worsening fatigue.

## 2017-11-09 NOTE — Assessment & Plan Note (Addendum)
Marked weight loss. Encouraged nutritional supplement.

## 2017-11-09 NOTE — Care Management Note (Signed)
Case Management Note  Patient Details  Name: Katherine Boone MRN: [id number] Date of Birth: [birth year]  Subjective/Objective:   Metastatic Cervical Malignancy, UTI                Action/Plan: NCM spoke to pt and husband at bedside. Offered choice/list provided. Agreeable to [facility]. Pt states she does not need DME at this time. Contacted [facility] with new referral.   Expected Discharge Date: 11/10/2017             Expected Discharge Plan:  Home w Hospice Care  In-House Referral:  NA  Discharge planning Services  CM Consult  Post Acute Care Choice:  Hospice Choice offered to:  Patient, Spouse  DME Arranged:  N/A DME Agency:  NA  HH Arranged:  RN [place] Agency:  [facility]  Status of Service:  Completed, signed off  If discussed at [place] of Stay Meetings, dates discussed:    Additional Comments:  Erenest Rasher, RN 11/09/2017, 4:33 PM

## 2017-11-09 NOTE — Progress Notes (Signed)
Hospice and [facility]) Hospital Liaison: RN visit @ 5:15pm  Notified by Lacinda Axon, RN case manager, of pt's request for [facility] services at home after discharge. Chart and pt information under review by [facility] physician. Hospice eligibility pending at this time.   Hospital liaison Raina Mina, RN) spoke with pt at bedside to initiate education related to hospice philosophy, services and team approach to care. Pt verbalized understanding of information given. Per discussion, plan is for discharge to home by private vehicle on 11/10/2017.  Please send signed and completed DNR form home with pt/family.  Patient will need prescriptions for discharge comfort medications.  DME needs have been discussed, patient currently has the following equipment in the home: Gilford Rile however pt not using this equipment at this time.  [facility] aware of the above. Completed discharge summary will need to be faxed to [facility] at [phone] when final. Please notify [facility] when patient is ready to leave the unit at discharge.  (Call [phone] between 8:30 and 5:00pm. Call [phone] after 5pm). [facility] information and contact numbers given to patient during this visit.   Please call with hospice related questions  Thank you for this referral.  [facility] Liaison  [facility] Liaison are on AMION

## 2017-11-09 NOTE — Assessment & Plan Note (Addendum)
Unfortunately cancer has progressed as of latest imaging 09/2017.  Pt not interested in completing radiation, states she is not interested in pursuing further chemotherapy at this time.  She is aware of poor prognosis. Today we discussed hospice and palliative care involvement - she feels she currently has all her needs met at home with a supportive significant other, but agrees to hospice at home evaluation.  She will however call and schedule follow up with Dr Alvy Bimler. Will forward note to Dr Alvy Bimler as Juluis [place].

## 2017-11-09 NOTE — Progress Notes (Signed)
PROGRESS NOTE    Katherine Boone  HGD:[id number] DOB: 1945-07-30 DOA: 11/08/2017 PCP: [name], MD    Brief Narrative: Katherine Boone  is a 72 y.o. female with medical history ofcervical cancer on palliative chemo and radiation,colostomy,CKD IV who was treated for pansensitive E. coli UTI and bacteremia from blood culture that grew on 10/26/2017 with Rocephin and now currently completing Keflex returns today with UTI symptoms, complained of urinary frequency and urgency but no hematuria, she does have some dysuria, no flank pain  In ED UA suggest possible UTI, ED provider started patient on Levaquin pending blood and urine cultures  No fevers no chills she does have nausea with dry heaves  Additional history obtained from patient's significant other at bedside  Patient states that she does not desire further chemo or radiation therapy she wants to talk to palliative services and wants to enrol in hospice  As per patient's significant other patient's appetite and sleep has been very poor.   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  Urinalysis with large leukocytes, nitrite negative, many bacteria, greater than 50 WBCs.  Urine cultures pending.  Patient recently treated with Rocephin and subsequently oral Keflex for E. coli bacteremia and UTI.  Patient started on Levaquin which she seems to be tolerating which we will continue for now.  Follow.  2.  Insomnia/anorexia Remeron 50 mg nightly.  Diet has been liberalized.  3.  Anemia of chronic disease Likely secondary to underlying malignancy and prior chemoradiation therapy.  Anemia panel with folate deficiency.  Will place on folic acid 1 mg daily.  Follow H&H.  4.  Radiation enteritis status post  colostomy Stable.  5.  History of metastatic cervical malignancy stage IV Per admitting physician patient believes he actually has a variant and not cervical cancer.  Patient being followed by oncology.  Patient noted to have declined further chemo or radiation therapy and looking forward to enrolling with hospice.  Palliative care has assessed patient and patient's wishes are to treat the treatable such as UTI and dehydration and recommending home with hospice once patient is medically stable.    DVT prophylaxis: SCDs Code Status: DNR Family Communication: Updated patient.  No family at bedside. Disposition Plan: Home with hospice when medically stable, urine cultures have resulted with sensitivities.   Consultants:   Palliative care: Dr. Rowe Pavy 11/09/2017  Procedures:   Chest x-ray 11/08/2017    Antimicrobials:  IV Levaquin 11/08/2017   Subjective: Patient in bed.  States she is feeling better since admission.  Denies any chest pain or shortness of breath.  States dysuria is improving.  Objective: Vitals:   11/08/17 1820 11/08/17 2208 11/09/17 0519 11/09/17 1324  BP: 121/67 108/72 113/72 109/74  Pulse: (!) 105 (!) 105 (!) 104 99  Resp: 17 16 14 16   Temp: 98.9 F (37.2 C) 98.4 F (36.9 C) 98.3 F (36.8 C) 99.3 F (37.4 C)  TempSrc: Oral Oral Oral Oral  SpO2: 98% 99% 96% 97%    Intake/Output Summary (Last 24 hours) at 11/09/2017 1452 Last data filed at 11/09/2017 0300 Gross per 24 hour  Intake 1834.68 ml  Output -  Net 1834.68 ml   There were no vitals filed for this visit.  Examination:  General exam:  Appears calm and comfortable  Respiratory system: Clear to auscultation. Respiratory effort normal. Cardiovascular system: S1 & S2 heard, RRR. No JVD, murmurs, rubs, gallops or clicks. No pedal edema. Gastrointestinal system: Abdomen is nondistended, soft and nontender. No organomegaly or masses felt. Normal bowel sounds heard. Central nervous system: Alert and  oriented. No focal neurological deficits. Extremities: Symmetric 5 x 5 power. Skin: No rashes, lesions or ulcers Psychiatry: Judgement and insight appear normal. Mood & affect appropriate.     Data Reviewed: I have personally reviewed following labs and imaging studies  CBC: Recent Labs  Lab 11/08/17 1358 11/09/17 0548  WBC 9.9 7.8  HGB 10.4* 8.9*  HCT 32.1* 28.1*  MCV 84.0 84.9  PLT 245 295   Basic Metabolic Panel: Recent Labs  Lab 11/08/17 1358 11/09/17 0548  NA 140 140  K 3.8 3.7  CL 104 107  CO2 26 25  GLUCOSE 112* 103*  BUN 15 12  CREATININE 0.94 1.05*  CALCIUM 8.5* 8.2*   GFR: Estimated Creatinine Clearance: 43.6 mL/min (A) (by C-G formula based on SCr of 1.05 mg/dL (H)). Liver Function Tests: No results for input(s): AST, ALT, ALKPHOS, BILITOT, PROT, ALBUMIN in the last 168 hours. No results for input(s): LIPASE, AMYLASE in the last 168 hours. No results for input(s): AMMONIA in the last 168 hours. Coagulation Profile: No results for input(s): INR, PROTIME in the last 168 hours. Cardiac Enzymes: No results for input(s): CKTOTAL, CKMB, CKMBINDEX, TROPONINI in the last 168 hours. BNP (last 3 results) No results for input(s): PROBNP in the last 8760 hours. HbA1C: No results for input(s): HGBA1C in the last 72 hours. CBG: Recent Labs  Lab 11/08/17 1407  GLUCAP 98   Lipid Profile: No results for input(s): CHOL, HDL, LDLCALC, TRIG, CHOLHDL, LDLDIRECT in the last 72 hours. Thyroid Function Tests: No results for input(s): TSH, T4TOTAL, FREET4, T3FREE, THYROIDAB in the last 72 hours. Anemia Panel: Recent Labs    11/09/17 0906  VITAMINB12 354  FOLATE 4.0*  FERRITIN 751*  TIBC 128*  IRON 44   Sepsis Labs: Recent Labs  Lab 11/08/17 1408 11/08/17 1602  LATICACIDVEN 1.74 1.51    Recent Results (from the past 240 hour(s))  Culture, blood (routine x 2)     Status: None (Preliminary result)   Collection Time: 11/08/17  3:14 PM  Result Value Ref  Range Status   Specimen Description   Final    RIGHT ANTECUBITAL Performed at [facility] [address], [place], [place] [address]    Special Requests   Final    BOTTLES DRAWN AEROBIC AND ANAEROBIC Blood Culture adequate volume Performed at [facility] [address], [place], [place] [address]    Culture   Final    NO GROWTH < 24 HOURS Performed at [facility] [address], [place], [place] [address]    Report Status PENDING  Incomplete  Culture, blood (routine x 2)     Status: None (Preliminary result)   Collection Time: 11/08/17  3:41 PM  Result Value Ref Range Status   Specimen Description   Final    BLOOD LEFT HAND Performed at [facility] [address], [place], [place] [address]    Special Requests   Final    BOTTLES DRAWN AEROBIC AND ANAEROBIC Blood Culture results may not be optimal due to an excessive volume of blood received in culture bottles Performed at [facility] [address] [place], [place]  [address]    Culture   Final    NO GROWTH < 24 HOURS Performed at [facility], [place] [address] [place], [place] [address]    Report Status PENDING  Incomplete         Radiology Studies: Dg Chest 2 View  Result Date: 11/08/2017 CLINICAL DATA:  Fever weakness and dehydration EXAM: CHEST - 2 VIEW COMPARISON:  10/26/2017, CT chest 09/13/2017 FINDINGS: Right-sided central venous port tip over the SVC. Innumerable bilateral metastatic lung nodules, appears increased in number and size. Stable cardiomediastinal silhouette. No pneumothorax. Old deformity proximal right humerus. Lower thoracic compression fractures are unchanged. IMPRESSION: Multiple metastatic lung nodules, appear increased in number, some are cavitary. No acute focal airspace disease is seen. Electronically Signed   By: Donavan Foil M.D.   On: 11/08/2017 16:32        Scheduled Meds: . folic acid  1 mg  Oral Daily  . mirtazapine  15 mg Oral QHS  . pantoprazole  40 mg Oral Daily  . saccharomyces boulardii  250 mg Oral BID  . sodium chloride flush  3 mL Intravenous Q12H   Continuous Infusions: . sodium chloride    . dextrose 5 % and 0.45% NaCl 100 mL/hr at 11/09/17 0530  . levofloxacin (LEVAQUIN) IV Stopped (11/08/17 2155)     LOS: 0 days    Time spent: 35 minutes    Irine Seal, MD [facility] Pager [phone]  If 7PM-7AM, please contact night-coverage [email or web address] Password [email or web address] 11/09/2017, 2:52 PM

## 2017-11-09 NOTE — Assessment & Plan Note (Signed)
Recent hospitalization for UTI with sepsis with E coli bacteremia. Finishing keflex course, urinary symptoms seem to be improving.

## 2017-11-10 DIAGNOSIS — E860 Dehydration: Secondary | ICD-10-CM | POA: Diagnosis not present

## 2017-11-10 DIAGNOSIS — C799 Secondary malignant neoplasm of unspecified site: Secondary | ICD-10-CM | POA: Diagnosis not present

## 2017-11-10 DIAGNOSIS — C539 Malignant neoplasm of cervix uteri, unspecified: Secondary | ICD-10-CM | POA: Diagnosis not present

## 2017-11-10 DIAGNOSIS — N390 Urinary tract infection, site not specified: Secondary | ICD-10-CM | POA: Diagnosis not present

## 2017-11-10 DIAGNOSIS — C7989 Secondary malignant neoplasm of other specified sites: Secondary | ICD-10-CM | POA: Diagnosis not present

## 2017-11-10 DIAGNOSIS — G4700 Insomnia, unspecified: Secondary | ICD-10-CM | POA: Diagnosis not present

## 2017-11-10 DIAGNOSIS — D638 Anemia in other chronic diseases classified elsewhere: Secondary | ICD-10-CM | POA: Diagnosis not present

## 2017-11-10 DIAGNOSIS — N3000 Acute cystitis without hematuria: Secondary | ICD-10-CM | POA: Diagnosis not present

## 2017-11-10 LAB — BASIC METABOLIC PANEL
ANION GAP: 8 (ref 5–15)
BUN: 9 mg/dL (ref 8–23)
CALCIUM: 8.2 mg/dL — AB (ref 8.9–10.3)
CO2: 27 mmol/L (ref 22–32)
Chloride: 107 mmol/L (ref 98–111)
Creatinine, Ser: 1.11 mg/dL — ABNORMAL HIGH (ref 0.44–1.00)
GFR, EST AFRICAN AMERICAN: 56 mL/min — AB (ref >60)
GFR, EST NON AFRICAN AMERICAN: 48 mL/min — AB (ref >60)
Glucose, Bld: 94 mg/dL (ref 70–99)
Potassium: 4.1 mmol/L (ref 3.5–5.1)
SODIUM: 142 mmol/L (ref 135–145)

## 2017-11-10 LAB — CBC
HCT: 28.8 % — ABNORMAL LOW (ref 36.0–46.0)
Hemoglobin: 9.2 g/dL — ABNORMAL LOW (ref 12.0–15.0)
MCH: 27.1 pg (ref 26.0–34.0)
MCHC: 31.9 g/dL (ref 30.0–36.0)
MCV: 85 fL (ref 78.0–100.0)
Platelets: 217 10*3/uL (ref 150–400)
RBC: 3.39 MIL/uL — ABNORMAL LOW (ref 3.87–5.11)
RDW: 16.9 % — AB (ref 11.5–15.5)
WBC: 6.6 10*3/uL (ref 4.0–10.5)

## 2017-11-10 MED ORDER — FOLIC ACID 1 MG PO TABS: 1.0000 mg | ORAL_TABLET | Freq: Every day | ORAL | 0 refills | Status: AC

## 2017-11-10 MED ORDER — MIRTAZAPINE 15 MG PO TBDP: 15.0000 mg | ORAL_TABLET | Freq: Every day | ORAL | 0 refills | Status: AC

## 2017-11-10 MED ORDER — LEVOFLOXACIN 750 MG PO TABS
750.0000 mg | ORAL_TABLET | ORAL | Status: DC
  Administered 2017-11-10: 750 mg via ORAL
  Filled 2017-11-10: qty 1

## 2017-11-10 NOTE — Progress Notes (Signed)
Patient discharged to home, all discharge medications and instructions reviewed and questions answered.  Patient to be assisted to vehicle by wheelchair.  

## 2017-11-10 NOTE — Discharge Summary (Signed)
Physician Discharge Summary  Katherine Boone PYP:[id number] DOB: 01-10-1946 DOA: 11/08/2017  PCP: [name], MD  Admit date: 11/08/2017 Discharge date: 11/10/2017  Time spent: 60 minutes  Recommendations for Outpatient Follow-up:  1. Patient will be discharged home with hospice. 2. Follow-up with [name], MD in 2 weeks.   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 Roxan Hockey  Katherine Boone  is a 72 y.o. female with medical history ofcervical cancer on palliative chemo and radiation,colostomy,CKD IV who was treated for pansensitive E. coli UTI and bacteremia from blood culture that grew on 10/26/2017 with Rocephin and now currently completing Keflex returned on day of admission with UTI symptoms, complaining of urinary frequency and urgency but no hematuria, she did have some dysuria, no flank pain  In ED UA suggested possible UTI, ED provider started patient on Levaquin pending blood and urine cultures  No fevers no chills she does have nausea with dry heaves  Additional history obtained from patient's significant other at bedside  Patient stated that she did not desire further chemo or radiation therapy she wanted to talk to palliative services and wanted to enrol in hospice  As per patient's significant other patient's appetite and sleep has been very poor  Hospital Course:  1 recurrent UTI Patient presented with symptoms concerning for UTI.  Urinalysis with large leukocytes, nitrite negative, many bacteria, greater than 50 WBCs.  Urine cultures pending.   Patient recently treated with Rocephin and subsequently oral Keflex for E. coli bacteremia and UTI.  Patient started on Levaquin which already during the hospitalization.  Patient remained afebrile.  Dysuria improved and had resolved by day of discharge.  Urine cultures which were done had insignificant growth.  Patient received a total of 3 days of Levaquin during the hospitalization which she tolerated.  WBC was normal.  Patient was discharged home with hospice following.  No further antibiotics needed on discharge.  2.  Insomnia/anorexia Remeron 15 mg nightly.  Diet liberalized.  3.  Anemia of chronic disease Likely secondary to underlying malignancy and prior chemoradiation therapy.  Anemia panel with folate deficiency.  Patient was started on folic acid 1 mg daily.  Hemoglobin remained stable.   4.  Radiation enteritis status post colostomy Stable.  5.  History of metastatic cervical malignancy stage IV Per admitting physician patient believes he actually has a variant and not cervical cancer.  Patient being followed by oncology.  Patient noted to have declined further chemo or radiation therapy and looking forward to enrolling with hospice.  Palliative care has assessed patient and patient's wishes are to treat the treatable such as UTI and dehydration and recommending home with hospice once patient is medically stable.  Patient remained afebrile.  Urine cultures which were done with insignificant growth.  Patient received 3 days of Levaquin.  Patient will be discharged home with hospice.  Procedures:  Chest x-ray 11/08/2017  Consultations:  Palliative care: Dr. Rowe Pavy 11/09/2017  Discharge Exam: Vitals:   11/10/17 0526 11/10/17 1307  BP: 117/78 111/70  Pulse: (!) 107 (!) 111  Resp: 17 16  Temp: 98.6 F (37 C) 98.6 F (37 C)  SpO2: 93% 99%    General: NAD Cardiovascular: RRR Respiratory: CTAB  Discharge Instructions   Discharge Instructions    Diet general   Complete  by:  As directed    Increase activity slowly   Complete by:  As directed      Allergies as of 11/10/2017   No Known Allergies     Medication List    STOP taking these medications   cephALEXin 500 MG capsule Commonly known as:  KEFLEX   fluconazole 150 MG tablet Commonly known as:  DIFLUCAN     TAKE these medications   acetaminophen 325 MG tablet Commonly known as:  TYLENOL Take 2 tablets (650 mg total) by mouth every 6 (six) hours as needed for mild pain (or Fever >/= 101).   folic acid 1 MG tablet Commonly known as:  FOLVITE Take 1 tablet (1 mg total) by mouth daily. Start taking on:  11/11/2017   lidocaine-prilocaine cream Commonly known as:  EMLA Apply 1 application topically as needed.   mirtazapine 15 MG disintegrating tablet Commonly known as:  REMERON SOL-TAB Take 1 tablet (15 mg total) by mouth at bedtime.   nystatin powder Commonly known as:  MYCOSTATIN/NYSTOP Apply topically 3 (three) times daily.   omeprazole 20 MG capsule Commonly known as:  PRILOSEC TAKE 1 CAPSULE BY MOUTH EVERY DAY What changed:    how much to take  when to take this  reasons to take this   ondansetron 8 MG tablet Commonly known as:  ZOFRAN Take 1 tablet (8 mg total) by mouth every 8 (eight) hours as needed for nausea.   saccharomyces boulardii 250 MG capsule Commonly known as:  FLORASTOR Take 1 capsule (250 mg total) by mouth 2 (two) times daily.      No Known Allergies Follow-up Information    [facility] Follow up.   Why:  Hospice RN will call to arrange initial visit Contact information: [place]       [name], MD. Schedule an appointment as soon as possible for a visit in 2 week(s).   Specialty:  Family Medicine Contact information: [place] [address] [phone]            The results of significant diagnostics from this  hospitalization (including imaging, microbiology, ancillary and laboratory) are listed below for reference.    Significant Diagnostic Studies: Dg Chest 2 View  Result Date: 11/08/2017 CLINICAL DATA:  Fever weakness and dehydration EXAM: CHEST - 2 VIEW COMPARISON:  10/26/2017, CT chest 09/13/2017 FINDINGS: Right-sided central venous port tip over the SVC. Innumerable bilateral metastatic lung nodules, appears increased in number and size. Stable cardiomediastinal silhouette. No pneumothorax. Old deformity proximal right humerus. Lower thoracic compression fractures are unchanged. IMPRESSION: Multiple metastatic lung nodules, appear increased in number, some are cavitary. No acute focal airspace disease is seen. Electronically Signed   By: Donavan Foil M.D.   On: 11/08/2017 16:32   Dg Chest 2 View  Result Date: 10/26/2017 CLINICAL DATA:  72 year old with weakness, nausea and vomiting. Evaluate for sepsis. Patient has metastatic cervical cancer to the lungs. EXAM: CHEST - 2 VIEW COMPARISON:  Chest CT 09/13/2017 FINDINGS: Right jugular Port-A-Cath with the tip in the lower SVC. There are innumerable nodular densities throughout both lungs compatible with known metastatic disease. No large areas of lung consolidation or airspace  disease. Heart and mediastinum are within normal limits. Trachea is midline. No large pleural effusions. Again noted is a compression fracture at T12. IMPRESSION: Innumerable pulmonary nodules and compatible with known metastatic disease. No new airspace disease or consolidation in the lungs. Electronically Signed   By: Markus Daft M.D.   On: 10/26/2017 15:17    Microbiology: Recent Results (from the past 240 hour(s))  Urine culture     Status: Abnormal   Collection Time: 11/08/17  1:59 PM  Result Value Ref Range Status   Specimen Description   Final    URINE, CLEAN CATCH Performed at [facility], [place] [address], [place], [place] [address]    Special  Requests   Final    NONE Performed at [facility], [place] [address], [place], [place] [address]    Culture (A)  Final    <10,000 COLONIES/mL INSIGNIFICANT GROWTH Performed at [facility] [address], [place], [place] [address]    Report Status 11/09/2017 FINAL  Final  Culture, blood (routine x 2)     Status: None (Preliminary result)   Collection Time: 11/08/17  3:14 PM  Result Value Ref Range Status   Specimen Description   Final    RIGHT ANTECUBITAL Performed at [facility] [address] [place], [place] [address]    Special Requests   Final    BOTTLES DRAWN AEROBIC AND ANAEROBIC Blood Culture adequate volume Performed at [facility] [address], [place], [place] [address]    Culture   Final    NO GROWTH 2 DAYS Performed at [facility] [address], [place], [place] [address]    Report Status PENDING  Incomplete  Culture, blood (routine x 2)     Status: None (Preliminary result)   Collection Time: 11/08/17  3:41 PM  Result Value Ref Range Status   Specimen Description   Final    BLOOD LEFT HAND Performed at [facility] [address] [place], [place] [address]    Special Requests   Final    BOTTLES DRAWN AEROBIC AND ANAEROBIC Blood Culture results may not be optimal due to an excessive volume of blood received in culture bottles Performed at [facility] [address] [place], [place] [address]    Culture   Final    NO GROWTH 2 DAYS Performed at [facility] [address], [place],  [address]    Report Status PENDING  Incomplete     Labs: Basic Metabolic Panel: Recent Labs  Lab 11/08/17 1358 11/09/17 0548 11/10/17 0526  NA 140 140 142  K 3.8 3.7 4.1  CL 104 107 107  CO2 26 25 27   GLUCOSE 112* 103* 94  BUN 15 12 9   CREATININE 0.94 1.05* 1.11*  CALCIUM 8.5* 8.2* 8.2*   Liver Function Tests: No results for input(s):  AST, ALT, ALKPHOS, BILITOT, PROT, ALBUMIN in the last 168 hours. No results for input(s): LIPASE, AMYLASE in the last 168 hours. No results for input(s): AMMONIA in the last 168 hours. CBC: Recent Labs  Lab 11/08/17 1358 11/09/17 0548 11/10/17 0526  WBC 9.9 7.8 6.6  HGB 10.4* 8.9* 9.2*  HCT 32.1* 28.1* 28.8*  MCV 84.0 84.9 85.0  PLT 245 216 217   Cardiac Enzymes: No results for input(s): CKTOTAL, CKMB, CKMBINDEX, TROPONINI in the last 168 hours. BNP: BNP (last 3 results) No results for input(s): BNP in the  last 8760 hours.  ProBNP (last 3 results) No results for input(s): PROBNP in the last 8760 hours.  CBG: Recent Labs  Lab 11/08/17 1407  GLUCAP 98       Signed:  Irine Seal MD.  [facility] 11/10/2017, 2:15 PM

## 2017-11-10 NOTE — Progress Notes (Addendum)
[facility])  Notified by Jennell Corner on 11/09/17 of pt's request for [facility] services at home after discharge. Chart and pt information has been reviewed and hospice eligibility has been confirmed. RNCM Edwin Cap has been updated.   Hospital liaison Raina Mina, RN) spoke with pt at bedside to initiate education related to hospice philosophy, services and team approach to care on 11/09/17. Pt verbalized understanding of information given. Per discussion, plan is for discharge to home by private vehicle on 11/10/2017.  Please send signed and completed DNR form home with pt/family.  Patient will need prescriptions for discharge comfort medications.  DME needs have been discussed, patient currently has the following equipment in the home: [employer] Rile however pt not using this equipment at this time. She is requesting no additional equipment at this time.   [facility] aware of the above. Completed discharge summary will need to be faxed to [facility] at [phone] when final. Please notify [facility] when patient is ready to leave the unit at discharge.  (Call [phone] between 8:30 and 5:00pm. Call [phone] after 5pm). [facility] information and contact numbers given to patient during this visit.   Please call with hospice related questions.  Thank you for this referral.  Erling Conte, LCSW [phone]

## 2017-11-11 ENCOUNTER — Telehealth: Payer: Self-pay | Admitting: *Deleted

## 2017-11-11 NOTE — Telephone Encounter (Signed)
Spoke to pt and attempted to complete TCM; but pt states "I just dont feel like it." She also declined scheduling a f/u at this time and states she will contact office back once she has spoken to hospice RN

## 2017-11-13 LAB — CULTURE, BLOOD (ROUTINE X 2)
CULTURE: NO GROWTH
Culture: NO GROWTH
SPECIAL REQUESTS: ADEQUATE

## 2017-11-14 ENCOUNTER — Telehealth: Payer: Self-pay | Admitting: Oncology

## 2017-11-14 NOTE — Telephone Encounter (Signed)
Called Katherine Boone regarding her follow up on 12/05/17 with Dr. Sondra Come.  Advised her that we will cancel the appointment since she is under hospice care and that she can be seen PRN per Dr. Sondra Come.  She verbalized understanding and agreement.

## 2017-12-05 ENCOUNTER — Ambulatory Visit: Payer: Self-pay | Admitting: Radiation Oncology

## 2017-12-17 ENCOUNTER — Telehealth: Payer: Self-pay

## 2017-12-19 NOTE — Telephone Encounter (Signed)
Death certificate filled out.  

## 2017-12-20 NOTE — Telephone Encounter (Addendum)
Called Terrance Mass at number in chart to express my condolences, # not set up to accept voicemail.

## 2018-01-03 NOTE — Telephone Encounter (Signed)
Copied from [place] [id number]. Topic: General - Other >> Dec 25, 2017  3:09 PM Carolyn Stare wrote:  Katherine Boone with Hospice call to say pt passed away today at her home December 25, 2017 at 2:45 pm

## 2018-01-03 DEATH — deceased

## 2019-05-23 IMAGING — CR DG CHEST 2V
2 series · 2 of 2 positions shown · non-contrast
Comparison: 10/26/2017, CT chest 09/13/2017

CLINICAL DATA: Fever weakness and dehydration

EXAM:
CHEST - 2 VIEW

[w chest pa]
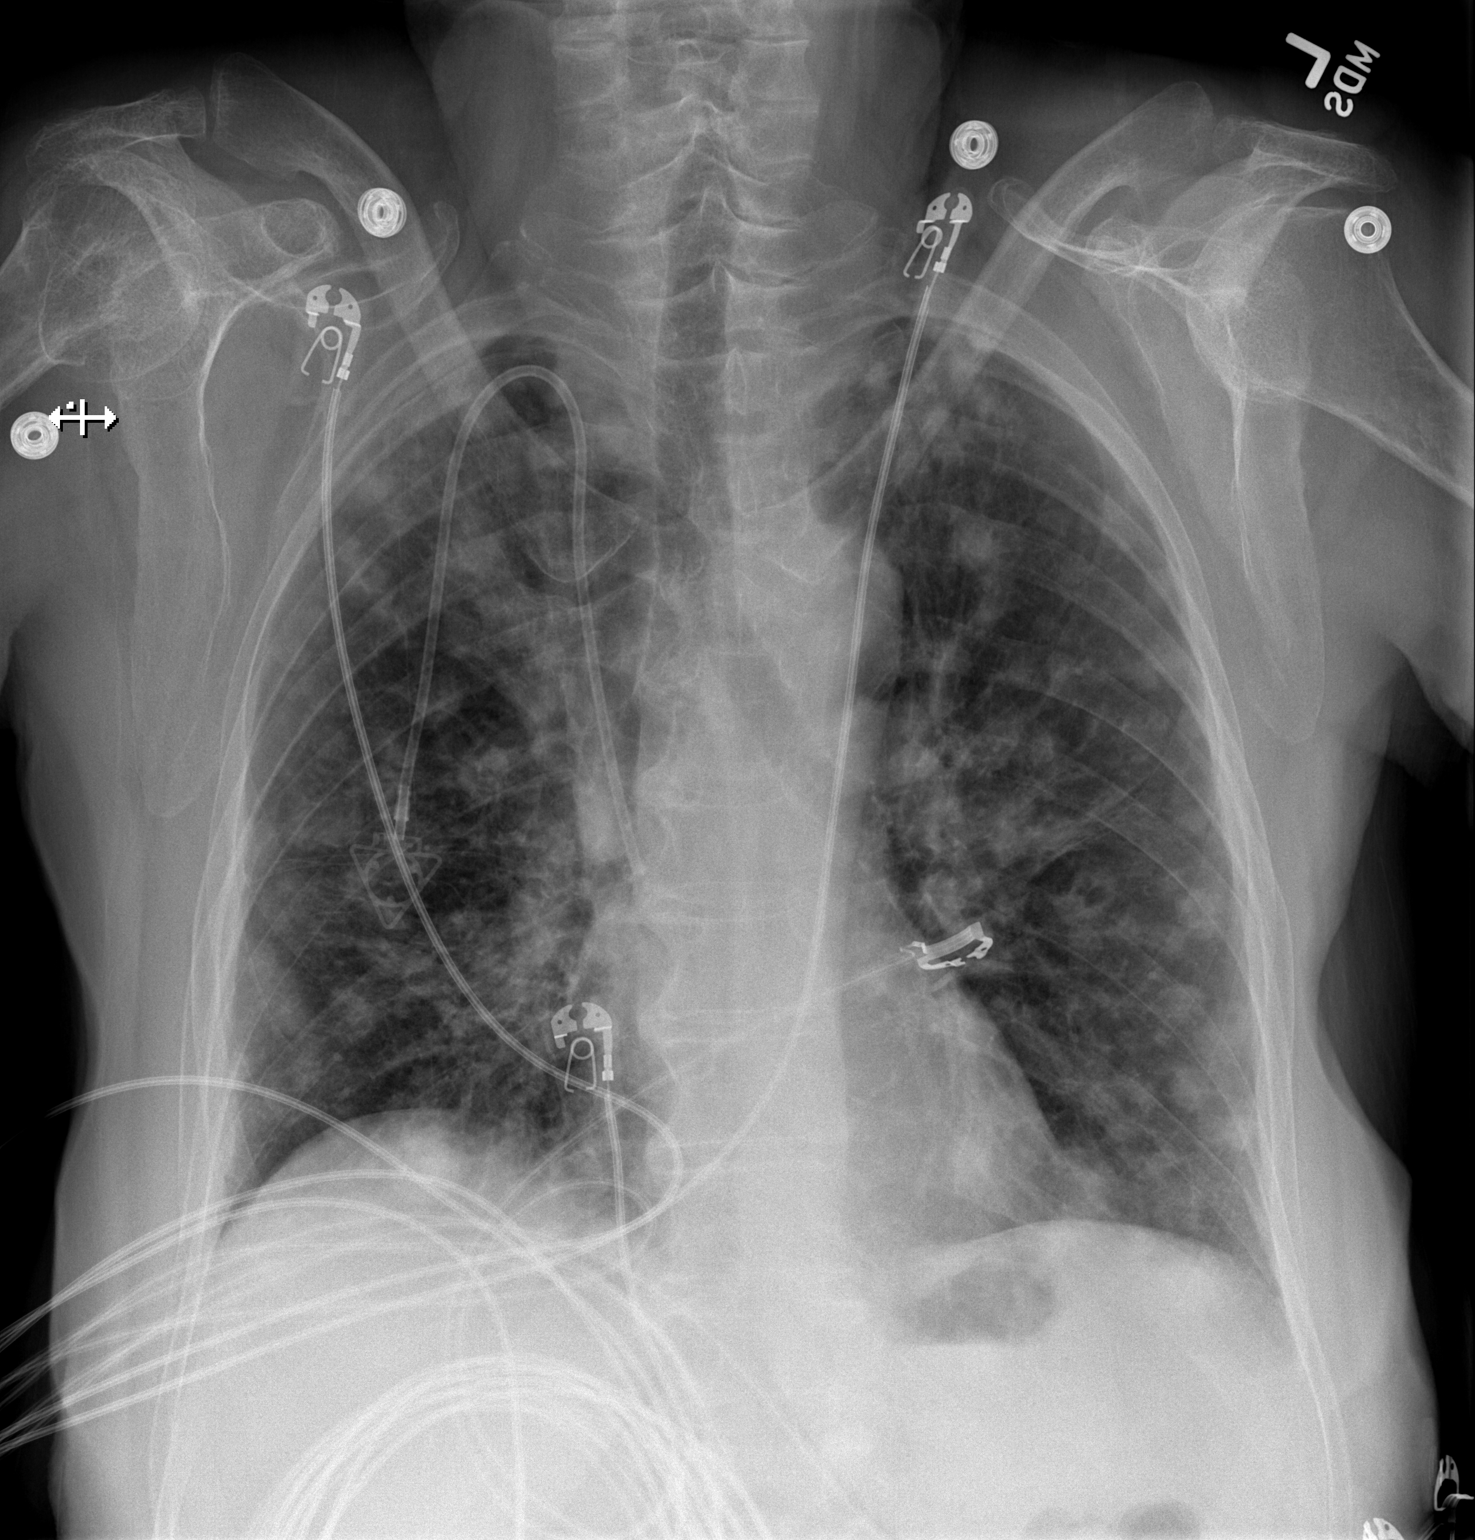

[w chest lat]
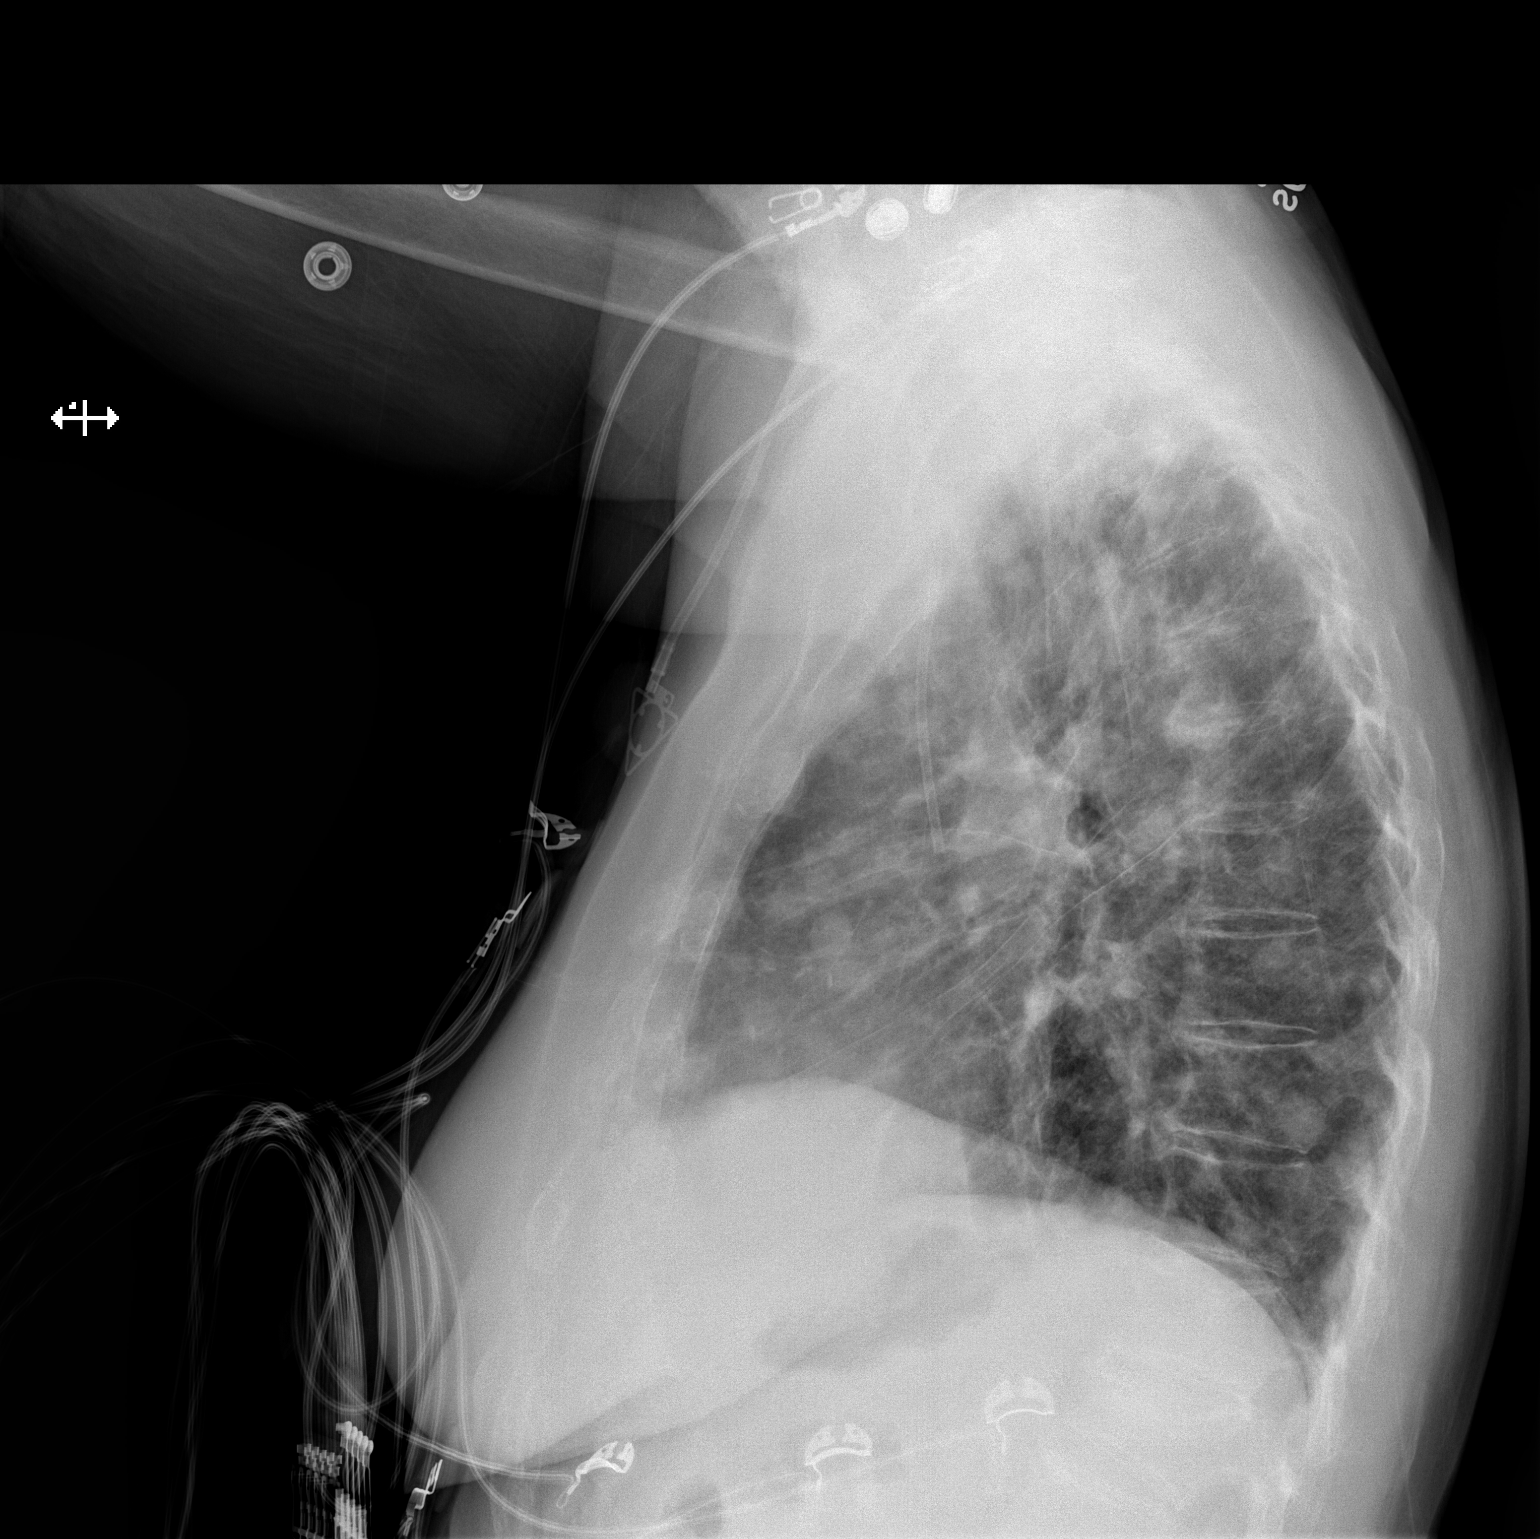

[2 of 2 positions shown; findings below may reference images not displayed]

FINDINGS: Right-sided central venous port tip over the SVC. Innumerable
bilateral metastatic lung nodules, appears increased in number and
size. Stable cardiomediastinal silhouette. No pneumothorax. Old
deformity proximal right humerus. Lower thoracic compression
fractures are unchanged.
IMPRESSION: Multiple metastatic lung nodules, appear increased in number, some
are cavitary. No acute focal airspace disease is seen.
